# Patient Record
Sex: Female | Born: 1950 | Race: White | Hispanic: No | Smoking: Current every day smoker
Health system: Southern US, Community
[De-identification: ages and names within clinical notes are randomized; demographics above are authoritative.]

## PROBLEM LIST (undated history)

## (undated) DIAGNOSIS — R7303 Prediabetes: Secondary | ICD-10-CM

## (undated) DIAGNOSIS — R569 Unspecified convulsions: Secondary | ICD-10-CM

## (undated) DIAGNOSIS — R251 Tremor, unspecified: Secondary | ICD-10-CM

## (undated) DIAGNOSIS — Z8619 Personal history of other infectious and parasitic diseases: Secondary | ICD-10-CM

## (undated) DIAGNOSIS — I6529 Occlusion and stenosis of unspecified carotid artery: Secondary | ICD-10-CM

## (undated) DIAGNOSIS — F32A Depression, unspecified: Secondary | ICD-10-CM

## (undated) DIAGNOSIS — M5430 Sciatica, unspecified side: Secondary | ICD-10-CM

## (undated) DIAGNOSIS — R011 Cardiac murmur, unspecified: Secondary | ICD-10-CM

## (undated) DIAGNOSIS — K589 Irritable bowel syndrome without diarrhea: Secondary | ICD-10-CM

## (undated) DIAGNOSIS — M549 Dorsalgia, unspecified: Secondary | ICD-10-CM

## (undated) DIAGNOSIS — A6920 Lyme disease, unspecified: Secondary | ICD-10-CM

## (undated) DIAGNOSIS — E785 Hyperlipidemia, unspecified: Secondary | ICD-10-CM

## (undated) DIAGNOSIS — Z923 Personal history of irradiation: Secondary | ICD-10-CM

## (undated) DIAGNOSIS — K219 Gastro-esophageal reflux disease without esophagitis: Secondary | ICD-10-CM

## (undated) DIAGNOSIS — F329 Major depressive disorder, single episode, unspecified: Secondary | ICD-10-CM

## (undated) DIAGNOSIS — M1990 Unspecified osteoarthritis, unspecified site: Secondary | ICD-10-CM

## (undated) DIAGNOSIS — I2510 Atherosclerotic heart disease of native coronary artery without angina pectoris: Secondary | ICD-10-CM

## (undated) DIAGNOSIS — E559 Vitamin D deficiency, unspecified: Secondary | ICD-10-CM

## (undated) DIAGNOSIS — I10 Essential (primary) hypertension: Secondary | ICD-10-CM

## (undated) DIAGNOSIS — M25559 Pain in unspecified hip: Secondary | ICD-10-CM

## (undated) DIAGNOSIS — J45909 Unspecified asthma, uncomplicated: Secondary | ICD-10-CM

## (undated) DIAGNOSIS — J439 Emphysema, unspecified: Secondary | ICD-10-CM

## (undated) HISTORY — PX: ABDOMINAL HYSTERECTOMY: SHX81

## (undated) HISTORY — PX: CAROTID ENDARTERECTOMY: SUR193

## (undated) HISTORY — PX: CHOLECYSTECTOMY: SHX55

## (undated) HISTORY — PX: OTHER SURGICAL HISTORY: SHX169

---

## 2016-03-09 ENCOUNTER — Observation Stay
Admission: EM | Admit: 2016-03-09 | Discharge: 2016-03-10 | Disposition: A | Discharge status: Home / Self Care | Payer: Medicare Other | Attending: Internal Medicine | Admitting: Internal Medicine

## 2016-03-09 ENCOUNTER — Emergency Department: Payer: Medicare Other

## 2016-03-09 DIAGNOSIS — I739 Peripheral vascular disease, unspecified: Secondary | ICD-10-CM | POA: Insufficient documentation

## 2016-03-09 DIAGNOSIS — Z818 Family history of other mental and behavioral disorders: Secondary | ICD-10-CM | POA: Insufficient documentation

## 2016-03-09 DIAGNOSIS — F329 Major depressive disorder, single episode, unspecified: Secondary | ICD-10-CM | POA: Insufficient documentation

## 2016-03-09 DIAGNOSIS — I959 Hypotension, unspecified: Secondary | ICD-10-CM | POA: Diagnosis not present

## 2016-03-09 DIAGNOSIS — Z8249 Family history of ischemic heart disease and other diseases of the circulatory system: Secondary | ICD-10-CM | POA: Insufficient documentation

## 2016-03-09 DIAGNOSIS — Z811 Family history of alcohol abuse and dependence: Secondary | ICD-10-CM | POA: Diagnosis not present

## 2016-03-09 DIAGNOSIS — R55 Syncope and collapse: Secondary | ICD-10-CM | POA: Diagnosis not present

## 2016-03-09 DIAGNOSIS — I6529 Occlusion and stenosis of unspecified carotid artery: Secondary | ICD-10-CM | POA: Insufficient documentation

## 2016-03-09 DIAGNOSIS — J45909 Unspecified asthma, uncomplicated: Secondary | ICD-10-CM | POA: Diagnosis not present

## 2016-03-09 DIAGNOSIS — I10 Essential (primary) hypertension: Secondary | ICD-10-CM | POA: Diagnosis present

## 2016-03-09 DIAGNOSIS — Z87891 Personal history of nicotine dependence: Secondary | ICD-10-CM | POA: Insufficient documentation

## 2016-03-09 DIAGNOSIS — Z9049 Acquired absence of other specified parts of digestive tract: Secondary | ICD-10-CM | POA: Diagnosis not present

## 2016-03-09 DIAGNOSIS — R011 Cardiac murmur, unspecified: Secondary | ICD-10-CM | POA: Diagnosis not present

## 2016-03-09 DIAGNOSIS — Z880 Allergy status to penicillin: Secondary | ICD-10-CM | POA: Diagnosis not present

## 2016-03-09 DIAGNOSIS — Y92009 Unspecified place in unspecified non-institutional (private) residence as the place of occurrence of the external cause: Secondary | ICD-10-CM | POA: Diagnosis not present

## 2016-03-09 DIAGNOSIS — F17200 Nicotine dependence, unspecified, uncomplicated: Secondary | ICD-10-CM | POA: Insufficient documentation

## 2016-03-09 DIAGNOSIS — Z7982 Long term (current) use of aspirin: Secondary | ICD-10-CM | POA: Insufficient documentation

## 2016-03-09 DIAGNOSIS — Z833 Family history of diabetes mellitus: Secondary | ICD-10-CM | POA: Diagnosis not present

## 2016-03-09 DIAGNOSIS — Z79899 Other long term (current) drug therapy: Secondary | ICD-10-CM | POA: Insufficient documentation

## 2016-03-09 DIAGNOSIS — Z90710 Acquired absence of both cervix and uterus: Secondary | ICD-10-CM | POA: Diagnosis not present

## 2016-03-09 DIAGNOSIS — Z820 Family history of epilepsy and other diseases of the nervous system: Secondary | ICD-10-CM | POA: Insufficient documentation

## 2016-03-09 DIAGNOSIS — E785 Hyperlipidemia, unspecified: Secondary | ICD-10-CM | POA: Diagnosis not present

## 2016-03-09 DIAGNOSIS — K589 Irritable bowel syndrome without diarrhea: Secondary | ICD-10-CM | POA: Insufficient documentation

## 2016-03-09 DIAGNOSIS — F32A Depression, unspecified: Secondary | ICD-10-CM | POA: Diagnosis present

## 2016-03-09 HISTORY — DX: Occlusion and stenosis of unspecified carotid artery: I65.29

## 2016-03-09 HISTORY — DX: Hyperlipidemia, unspecified: E78.5

## 2016-03-09 HISTORY — DX: Cardiac murmur, unspecified: R01.1

## 2016-03-09 HISTORY — DX: Essential (primary) hypertension: I10

## 2016-03-09 HISTORY — DX: Irritable bowel syndrome, unspecified: K58.9

## 2016-03-09 HISTORY — DX: Depression, unspecified: F32.A

## 2016-03-09 HISTORY — DX: Major depressive disorder, single episode, unspecified: F32.9

## 2016-03-09 HISTORY — DX: Unspecified asthma, uncomplicated: J45.909

## 2016-03-09 LAB — BASIC METABOLIC PANEL
Anion gap: 7 (ref 5–15)
BUN: 15 mg/dL (ref 6–20)
CO2: 26 mmol/L (ref 22–32)
Calcium: 9.1 mg/dL (ref 8.9–10.3)
Chloride: 106 mmol/L (ref 101–111)
Creatinine, Ser: 0.75 mg/dL (ref 0.44–1.00)
GFR calc Af Amer: >60 mL/min (ref >60)
Glucose, Bld: 121 mg/dL — ABNORMAL HIGH (ref 65–99)
POTASSIUM: 4 mmol/L (ref 3.5–5.1)
Sodium: 139 mmol/L (ref 135–145)

## 2016-03-09 LAB — CBC WITH DIFFERENTIAL/PLATELET
BASOS ABS: 0 10*3/uL (ref 0–0.1)
Basophils Relative: 0 %
Eosinophils Absolute: 0 10*3/uL (ref 0–0.7)
Eosinophils Relative: 0 %
HCT: 41.8 % (ref 35.0–47.0)
Hemoglobin: 14.2 g/dL (ref 12.0–16.0)
LYMPHS PCT: 15 %
Lymphs Abs: 1.5 10*3/uL (ref 1.0–3.6)
MCH: 30.9 pg (ref 26.0–34.0)
MCHC: 34 g/dL (ref 32.0–36.0)
MCV: 90.9 fL (ref 80.0–100.0)
Monocytes Absolute: 0.5 10*3/uL (ref 0.2–0.9)
Monocytes Relative: 5 %
NEUTROS ABS: 8.2 10*3/uL — AB (ref 1.4–6.5)
NEUTROS PCT: 80 %
PLATELETS: 139 10*3/uL — AB (ref 150–440)
RBC: 4.6 MIL/uL (ref 3.80–5.20)
RDW: 13.1 % (ref 11.5–14.5)
WBC: 10.2 10*3/uL (ref 3.6–11.0)

## 2016-03-09 LAB — TROPONIN I: Troponin I: <0.03 ng/mL (ref <0.031)

## 2016-03-09 NOTE — ED Notes (Signed)
Called lab about blood work sent, lab reports that blood was hemolyzed and needs to be collected

## 2016-03-09 NOTE — ED Notes (Signed)
Pt from home via EMS. Reports she was cooking dinner when she felt like her energy left her and she lost consciousness. Reports son caught pt before she hit the ground. Reports back pain where she fell.

## 2016-03-09 NOTE — ED Provider Notes (Signed)
[facility] Emergency Department Provider Note    ____________________________________________  Time seen: ~2100  I have reviewed the triage vital signs and the nursing notes.   HISTORY  Chief Complaint Loss of Consciousness   History limited by: Not Limited   HPI Barbara Contreras is a 65 y.o. female who presents to the emergency department today because ofconcern for a syncopal episode. Patient states she was cooking dinner. She states all of a sudden she felt like all of her energy left her. She then passed out. She was, by her son and her way to the ground. She had some mild back pain after the fall. She denies any concurrent chest pain, palpitations or shortness breath. Denies any recent illness, nausea or vomiting.    Past Medical History  Diagnosis Date  . Hypertension     There are no active problems to display for this patient.   History reviewed. No pertinent past surgical history.  No current outpatient prescriptions on file.  Allergies Penicillins  No family history on file.  Social History Social History  Substance Use Topics  . Smoking status: Current Every Day Smoker  . Smokeless tobacco: None  . Alcohol Use: Yes    Review of Systems  Constitutional: Negative for fever. Cardiovascular: Negative for chest pain. Respiratory: Negative for shortness of breath. Gastrointestinal: Negative for abdominal pain, vomiting and diarrhea. Neurological: Negative for headaches, focal weakness or numbness.   10-point ROS otherwise negative.  ____________________________________________   PHYSICAL EXAM:  VITAL SIGNS: ED Triage Vitals  Enc Vitals Group     BP --      Pulse --      Resp --      Temp 03/09/16 1917 97.7 F (36.5 C)     Temp src --      SpO2 --      Weight 03/09/16 1917 172 lb (78.019 kg)     Height 03/09/16 1917 5\' 5"  (1.651 m)     Head Cir --      Peak Flow --      Pain Score 03/09/16 1918 3    Constitutional: Alert and oriented. Well appearing and in no distress. Eyes: Conjunctivae are normal. PERRL. Normal extraocular movements. ENT   Head: Normocephalic and atraumatic.   Nose: No congestion/rhinnorhea.   Mouth/Throat: Mucous membranes are moist.   Neck: No stridor. Hematological/Lymphatic/Immunilogical: No cervical lymphadenopathy. Cardiovascular: Normal rate, regular rhythm.  Grade II/VI systolic murmur.  Respiratory: Normal respiratory effort without tachypnea nor retractions. Breath sounds are clear and equal bilaterally. No wheezes/rales/rhonchi. Gastrointestinal: Soft and nontender. No distention Genitourinary: Deferred Musculoskeletal: Normal range of motion in all extremities. No joint effusions.  No lower extremity tenderness nor edema. Neurologic:  Normal speech and language. No gross focal neurologic deficits are appreciated.  Skin:  Skin is warm, dry and intact. No rash noted. Psychiatric: Mood and affect are normal. Speech and behavior are normal. Patient exhibits appropriate insight and judgment.  ____________________________________________    LABS (pertinent positives/negatives)  Labs Reviewed  CBC WITH DIFFERENTIAL/PLATELET - Abnormal; Notable for the following:    Platelets 139 (*)    Neutro Abs 8.2 (*)    All other components within normal limits  BASIC METABOLIC PANEL - Abnormal; Notable for the following:    Glucose, Bld 121 (*)    All other components within normal limits  TROPONIN I     ____________________________________________   EKG  I, Nance Pear, attending physician, personally viewed and interpreted this EKG  EKG  Time: 1921 Rate: 71 Rhythm: normal sinus rhythm Axis: left axis deviation Intervals: qtc 458 QRS: narrow ST changes: no st elevation Impression: abnormal ekg   ____________________________________________    RADIOLOGY  CXR IMPRESSION: Minimal linear left base scarring or  atelectasis.  ____________________________________________   PROCEDURES  Procedure(s) performed: None  Critical Care performed: No  ____________________________________________   INITIAL IMPRESSION / ASSESSMENT AND PLAN / ED COURSE  Pertinent labs & imaging results that were available during my care of the patient were reviewed by me and considered in my medical decision making (see chart for details).  Patient presented to the emergency department today after a syncopal episode. Patient did not have any chest pain during that event. There are the emergency department however patient did have multiple oxygen saturation in the mid 80s. Patient without any known history of COPD. Furthermore patient does have murmur on exam. Will plan on admission possible service for further workup.  ____________________________________________   FINAL CLINICAL IMPRESSION(S) / ED DIAGNOSES  Syncopy  Nance Pear, MD 03/09/16 2318

## 2016-03-09 NOTE — ED Notes (Signed)
Ambulated pt, Oxygen stayed in the 90s on RA, but dips into the 80s occasionally while pt is lying

## 2016-03-09 NOTE — ED Notes (Signed)
Patient transported to X-ray 

## 2016-03-09 NOTE — ED Notes (Signed)
Pt placed on 2L oxygen 

## 2016-03-10 DIAGNOSIS — R55 Syncope and collapse: Secondary | ICD-10-CM | POA: Diagnosis not present

## 2016-03-10 LAB — BASIC METABOLIC PANEL
ANION GAP: 4 — AB (ref 5–15)
BUN: 15 mg/dL (ref 6–20)
CALCIUM: 8.9 mg/dL (ref 8.9–10.3)
CHLORIDE: 107 mmol/L (ref 101–111)
CO2: 28 mmol/L (ref 22–32)
Creatinine, Ser: 0.67 mg/dL (ref 0.44–1.00)
GFR calc non Af Amer: >60 mL/min (ref >60)
Glucose, Bld: 108 mg/dL — ABNORMAL HIGH (ref 65–99)
Potassium: 3.9 mmol/L (ref 3.5–5.1)
Sodium: 139 mmol/L (ref 135–145)

## 2016-03-10 LAB — CREATININE, SERUM
CREATININE: 0.76 mg/dL (ref 0.44–1.00)
GFR calc Af Amer: >60 mL/min (ref >60)
GFR calc non Af Amer: >60 mL/min (ref >60)

## 2016-03-10 LAB — CBC
HCT: 42.1 % (ref 35.0–47.0)
HCT: 45 % (ref 35.0–47.0)
HEMOGLOBIN: 15.1 g/dL (ref 12.0–16.0)
Hemoglobin: 14.6 g/dL (ref 12.0–16.0)
MCH: 32.2 pg (ref 26.0–34.0)
MCH: 30.7 pg (ref 26.0–34.0)
MCHC: 34.7 g/dL (ref 32.0–36.0)
MCHC: 33.4 g/dL (ref 32.0–36.0)
MCV: 92.8 fL (ref 80.0–100.0)
MCV: 91.8 fL (ref 80.0–100.0)
PLATELETS: 131 10*3/uL — AB (ref 150–440)
Platelets: 131 10*3/uL — ABNORMAL LOW (ref 150–440)
RBC: 4.53 MIL/uL (ref 3.80–5.20)
RBC: 4.91 MIL/uL (ref 3.80–5.20)
RDW: 13.3 % (ref 11.5–14.5)
RDW: 13.5 % (ref 11.5–14.5)
WBC: 9.3 10*3/uL (ref 3.6–11.0)
WBC: 8.5 10*3/uL (ref 3.6–11.0)

## 2016-03-10 LAB — TROPONIN I
Troponin I: <0.03 ng/mL (ref <0.031)
Troponin I: <0.03 ng/mL (ref <0.031)

## 2016-03-10 MED ORDER — LOSARTAN POTASSIUM 50 MG PO TABS
50.0000 mg | ORAL_TABLET | Freq: Every day | ORAL | Status: DC
  Administered 2016-03-10: 50 mg via ORAL
  Filled 2016-03-10: qty 1

## 2016-03-10 MED ORDER — ACETAMINOPHEN 650 MG RE SUPP: 650.0000 mg | Freq: Four times a day (QID) | RECTAL | Status: DC | PRN

## 2016-03-10 MED ORDER — GABAPENTIN 600 MG PO TABS
600.0000 mg | ORAL_TABLET | Freq: Three times a day (TID) | ORAL | Status: DC
  Administered 2016-03-10: 600 mg via ORAL
  Filled 2016-03-10: qty 1

## 2016-03-10 MED ORDER — ASPIRIN EC 81 MG PO TBEC
81.0000 mg | DELAYED_RELEASE_TABLET | Freq: Every day | ORAL | Status: DC
  Administered 2016-03-10: 81 mg via ORAL
  Filled 2016-03-10: qty 1

## 2016-03-10 MED ORDER — ACETAMINOPHEN 325 MG PO TABS: 650.0000 mg | ORAL_TABLET | Freq: Four times a day (QID) | ORAL | Status: DC | PRN

## 2016-03-10 MED ORDER — SODIUM CHLORIDE 0.9 % IV SOLN
INTRAVENOUS | Status: AC
  Administered 2016-03-10: 02:00:00 via INTRAVENOUS

## 2016-03-10 MED ORDER — ENOXAPARIN SODIUM 40 MG/0.4ML ~~LOC~~ SOLN: 40.0000 mg | SUBCUTANEOUS | Status: DC

## 2016-03-10 MED ORDER — RISAQUAD PO CAPS
1.0000 | ORAL_CAPSULE | Freq: Every day | ORAL | Status: DC
  Administered 2016-03-10: 1 via ORAL
  Filled 2016-03-10: qty 1

## 2016-03-10 MED ORDER — VENLAFAXINE HCL ER 37.5 MG PO CP24
37.5000 mg | ORAL_CAPSULE | Freq: Every day | ORAL | Status: DC
  Administered 2016-03-10: 37.5 mg via ORAL
  Filled 2016-03-10 (×2): qty 1

## 2016-03-10 MED ORDER — ONDANSETRON HCL 4 MG PO TABS: 4.0000 mg | ORAL_TABLET | Freq: Four times a day (QID) | ORAL | Status: DC | PRN

## 2016-03-10 MED ORDER — SODIUM CHLORIDE 0.9% FLUSH
3.0000 mL | Freq: Two times a day (BID) | INTRAVENOUS | Status: DC
  Administered 2016-03-10 (×2): 3 mL via INTRAVENOUS

## 2016-03-10 MED ORDER — ATORVASTATIN CALCIUM 20 MG PO TABS
40.0000 mg | ORAL_TABLET | Freq: Every day | ORAL | Status: DC
  Administered 2016-03-10: 40 mg via ORAL
  Filled 2016-03-10: qty 2

## 2016-03-10 MED ORDER — ONDANSETRON HCL 4 MG/2ML IJ SOLN: 4.0000 mg | Freq: Four times a day (QID) | INTRAMUSCULAR | Status: DC | PRN

## 2016-03-10 NOTE — Care Management Note (Signed)
Case Management Note  Patient Details  Name: Barbara Contreras MRN: [id number] Date of Birth: 01-02-1951  Subjective/Objective:   Met with patient at bedside for assessment of discharge planning needs. Patient lives at home with her sister. Admitted with syncopal episode. Cardiology consulted. She is independent, active and drives. No DME. No O2. Denies issues obtaining medical care, copays or other financial concerns. No needs anticipated. Case closed.                  Action/Plan: No needs   Expected Discharge Date:                  Expected Discharge Plan:  Home/Self Care  In-House Referral:     Discharge planning Services     Post Acute Care Choice:    Choice offered to:     DME Arranged:    DME Agency:     HH Arranged:    HH Agency:     Status of Service:  Completed, signed off  Medicare Important Message Given:    Date Medicare IM Given:    Medicare IM give by:    Date Additional Medicare IM Given:    Additional Medicare Important Message give by:     If discussed at [place] of Stay Meetings, dates discussed:    Additional Comments:  Jolly Mango, RN 03/10/2016, 9:12 AM

## 2016-03-10 NOTE — Progress Notes (Signed)
Pt admitted to room 242. A&Ox4, VSS, no complaints at this time. Pt oriented to call bell, telephone, bed alarm, and need to call nursing before up out of bed. Skin assessed and telemetry verified with Lexi, RN. RN will continue to monitor. Rachael Fee, RN

## 2016-03-10 NOTE — Consult Note (Signed)
Reason for Consult: Syncope Referring Physician: Dr. Lance Coon hospitalist, Dr. Myna Bright primary  Barbara Contreras is an 65 y.o. female.  HPI: Patient reportedly had a syncopal episode at home witnessed by his brother-in-law while walking in the house. Patient states she has not had an episode of this before had no significant warning any sense movement of her mother-in-law tried to work up and she seemed like she went out a few more times and fluctuating level of consciousness before EMS showed up. Patient denied any chest pain no shortness of breath no incontinence denies any palpitations or tachycardia. She has a history of peripheral vascular disease including carotid endarterectomy for stenosis. Patient has no history of seizures feels fine now. May have had hypotension when evaluated by EMS. Patient is worried she may have had inner ear trouble has a known murmur had recently had noninvasive cardiac evaluation with her cardiologist Dr. Nehemiah Massed a few months ago including stress test which was unremarkable now here for follow-up.  Past Medical History  Diagnosis Date  . Hypertension   . HLD (hyperlipidemia)   . IBS (irritable bowel syndrome)   . Asthma   . Carotid stenosis   . Depression   . Heart murmur     Past Surgical History  Procedure Laterality Date  . Carotid endarterectomy    . Abdominal hysterectomy    . Cholecystectomy      Family History  Problem Relation Age of Onset  . Alcohol abuse Father   . Alzheimer's disease Father   . Anxiety disorder Father   . Depression Father   . Alcohol abuse Mother   . CAD Mother   . Diabetes Mother   . Hyperlipidemia Mother   . Depression Mother     Social History:  reports that she has been smoking.  She does not have any smokeless tobacco history on file. She reports that she drinks alcohol. She reports that she does not use illicit drugs.  Allergies:  Allergies  Allergen Reactions  . Morphine And Related Itching   . Penicillins Other (See Comments)    Reaction: unknown Patient cannot answer follow-up questions.    Medications: I have reviewed the patient's current medications.  Results for orders placed or performed during the hospital encounter of 03/09/16 (from the past 48 hour(s))  CBC with Differential     Status: Abnormal   Collection Time: 03/09/16  8:55 PM  Result Value Ref Range   WBC 10.2 3.6 - 11.0 K/uL   RBC 4.60 3.80 - 5.20 MIL/uL   Hemoglobin 14.2 12.0 - 16.0 g/dL   HCT 41.8 35.0 - 47.0 %   MCV 90.9 80.0 - 100.0 fL   MCH 30.9 26.0 - 34.0 pg   MCHC 34.0 32.0 - 36.0 g/dL   RDW 13.1 11.5 - 14.5 %   Platelets 139 (L) 150 - 440 K/uL   Neutrophils Relative % 80 %   Neutro Abs 8.2 (H) 1.4 - 6.5 K/uL   Lymphocytes Relative 15 %   Lymphs Abs 1.5 1.0 - 3.6 K/uL   Monocytes Relative 5 %   Monocytes Absolute 0.5 0.2 - 0.9 K/uL   Eosinophils Relative 0 %   Eosinophils Absolute 0.0 0 - 0.7 K/uL   Basophils Relative 0 %   Basophils Absolute 0.0 0 - 0.1 K/uL  Basic metabolic panel     Status: Abnormal   Collection Time: 03/09/16  8:55 PM  Result Value Ref Range   Sodium 139 135 - 145 mmol/L  Potassium 4.0 3.5 - 5.1 mmol/L   Chloride 106 101 - 111 mmol/L   CO2 26 22 - 32 mmol/L   Glucose, Bld 121 (H) 65 - 99 mg/dL   BUN 15 6 - 20 mg/dL   Creatinine, Ser 0.75 0.44 - 1.00 mg/dL   Calcium 9.1 8.9 - 10.3 mg/dL   GFR calc non Af Amer >60 >60 mL/min   GFR calc Af Amer >60 >60 mL/min    Comment: (NOTE) The eGFR has been calculated using the CKD EPI equation. This calculation has not been validated in all clinical situations. eGFR's persistently <60 mL/min signify possible Chronic Kidney Disease.    Anion gap 7 5 - 15  Troponin I     Status: None   Collection Time: 03/09/16  8:55 PM  Result Value Ref Range   Troponin I <0.03 <0.031 ng/mL    Comment:        NO INDICATION OF MYOCARDIAL INJURY.   CBC     Status: Abnormal   Collection Time: 03/10/16  3:17 AM  Result Value Ref  Range   WBC 9.3 3.6 - 11.0 K/uL   RBC 4.53 3.80 - 5.20 MIL/uL   Hemoglobin 14.6 12.0 - 16.0 g/dL   HCT 42.1 35.0 - 47.0 %   MCV 92.8 80.0 - 100.0 fL   MCH 32.2 26.0 - 34.0 pg   MCHC 34.7 32.0 - 36.0 g/dL   RDW 13.3 11.5 - 14.5 %   Platelets 131 (L) 150 - 440 K/uL  Creatinine, serum     Status: None   Collection Time: 03/10/16  3:17 AM  Result Value Ref Range   Creatinine, Ser 0.76 0.44 - 1.00 mg/dL   GFR calc non Af Amer >60 >60 mL/min   GFR calc Af Amer >60 >60 mL/min    Comment: (NOTE) The eGFR has been calculated using the CKD EPI equation. This calculation has not been validated in all clinical situations. eGFR's persistently <60 mL/min signify possible Chronic Kidney Disease.   Troponin I     Status: None   Collection Time: 03/10/16  3:17 AM  Result Value Ref Range   Troponin I <0.03 <0.031 ng/mL    Comment:        NO INDICATION OF MYOCARDIAL INJURY.   Troponin I     Status: None   Collection Time: 03/10/16  7:58 AM  Result Value Ref Range   Troponin I <0.03 <0.031 ng/mL    Comment:        NO INDICATION OF MYOCARDIAL INJURY.   Basic metabolic panel     Status: Abnormal   Collection Time: 03/10/16  7:58 AM  Result Value Ref Range   Sodium 139 135 - 145 mmol/L   Potassium 3.9 3.5 - 5.1 mmol/L   Chloride 107 101 - 111 mmol/L   CO2 28 22 - 32 mmol/L   Glucose, Bld 108 (H) 65 - 99 mg/dL   BUN 15 6 - 20 mg/dL   Creatinine, Ser 0.67 0.44 - 1.00 mg/dL   Calcium 8.9 8.9 - 10.3 mg/dL   GFR calc non Af Amer >60 >60 mL/min   GFR calc Af Amer >60 >60 mL/min    Comment: (NOTE) The eGFR has been calculated using the CKD EPI equation. This calculation has not been validated in all clinical situations. eGFR's persistently <60 mL/min signify possible Chronic Kidney Disease.    Anion gap 4 (L) 5 - 15  CBC     Status: Abnormal  Collection Time: 03/10/16  7:58 AM  Result Value Ref Range   WBC 8.5 3.6 - 11.0 K/uL   RBC 4.91 3.80 - 5.20 MIL/uL   Hemoglobin 15.1 12.0 -  16.0 g/dL   HCT 45.0 35.0 - 47.0 %   MCV 91.8 80.0 - 100.0 fL   MCH 30.7 26.0 - 34.0 pg   MCHC 33.4 32.0 - 36.0 g/dL   RDW 13.5 11.5 - 14.5 %   Platelets 131 (L) 150 - 440 K/uL    Dg Chest 2 View  03/09/2016  CLINICAL DATA:  Syncope.  Fell at home. EXAM: CHEST  2 VIEW COMPARISON:  None. FINDINGS: Mild linear opacities in the left base may represent scarring or minimal atelectasis. No confluent alveolar opacity. No effusions. Normal pulmonary vasculature. Normal heart size. Unremarkable hilar and mediastinal contours. IMPRESSION: Minimal linear left base scarring or atelectasis. Electronically Signed   By: Andreas Newport M.D.   On: 03/09/2016 22:06    Review of Systems  Unable to perform ROS Constitutional: Negative.   HENT: Negative.   Eyes: Negative.   Respiratory: Negative.   Gastrointestinal: Negative.   Genitourinary: Negative.   Musculoskeletal: Negative.   Skin: Negative.   Neurological: Positive for dizziness and loss of consciousness.  Endo/Heme/Allergies: Negative.   Psychiatric/Behavioral: Negative.    Blood pressure 141/69, pulse 59, temperature 98.3 F (36.8 C), temperature source Oral, resp. rate 18, height 5' 5"  (1.651 m), weight 78.019 kg (172 lb), SpO2 92 %. Physical Exam  Nursing note and vitals reviewed. Constitutional: She is oriented to person, place, and time. She appears well-developed and well-nourished.  HENT:  Head: Normocephalic and atraumatic.  Eyes: Conjunctivae and EOM are normal. Pupils are equal, round, and reactive to light.  Neck: Normal range of motion. Neck supple.  Cardiovascular: Normal rate and regular rhythm.   Murmur heard. Respiratory: Effort normal and breath sounds normal.  GI: Soft. Bowel sounds are normal.  Musculoskeletal: Normal range of motion.  Neurological: She is alert and oriented to person, place, and time. She has normal reflexes.  Skin: Skin is warm and dry.    Assessment/Plan: Syncope Murmur Peripheral vascular  disease CEA by history Hypertension Asthma Hyperlipidemia Depression Smoking Hypotension . PLAN Agree with admission to rule out myocardial infarction Agree with telemetry for 24 hours Recommend echocardiogram for further assessment for murmur Peripheral vascular disease by history consider carotid Dopplers Reduce blood pressure medications because recent hypotension Recommend inhalers for asthma symptoms Advised patient quit smoking Secondary prevention Follow-up EKGs and troponins Consider neurology evaluation Consider ENT evaluation If patient remains stable would consider cardiology workup as an outpatient Have the patient follow-up with Dr. Nehemiah Massed one to 2 weeks  CALLWOOD,DWAYNE D. 03/10/2016, 12:39 PM

## 2016-03-10 NOTE — H&P (Signed)
[place] [facility] [place] NAME: Barbara Contreras    MR#:  [id number]  DATE OF BIRTH:  1951-09-19  DATE OF ADMISSION:  03/09/2016  PRIMARY CARE PHYSICIAN: No primary care provider on file.   REQUESTING/REFERRING PHYSICIAN: Archie Balboa, MD  CHIEF COMPLAINT:   Chief Complaint  Patient presents with  . Loss of Consciousness    HISTORY OF PRESENT ILLNESS:  Barbara Contreras  is a 65 y.o. female who presents with Syncopal episode. Patient states that she was at her house tonight, and was walking in her kitchen when she had a sudden onset syncopal episode. She states that she did not have any pre-symptoms with lightheadedness, visual disturbance. She also does not note any accompanying symptoms such as chest pain or palpitations. Event was witnessed by her brother, which she states caught her. She states that her brother told her that she had to brief repeat syncopal episodes immediately if she tried to get up. Patient does not recall this. When she did come to there was no significant postictal state, with no loss of bowel or bladder continence. Patient is no prior history of seizures. She does have a history of carotid stenosis with prior endarterectomy, and a heart murmur. Hospitals were called for further evaluation of syncope.  PAST MEDICAL HISTORY:   Past Medical History  Diagnosis Date  . Hypertension   . HLD (hyperlipidemia)   . IBS (irritable bowel syndrome)   . Asthma   . Carotid stenosis   . Depression   . Heart murmur     PAST SURGICAL HISTORY:   Past Surgical History  Procedure Laterality Date  . Carotid endarterectomy    . Abdominal hysterectomy    . Cholecystectomy      SOCIAL HISTORY:   Social History  Substance Use Topics  . Smoking status: Current Every Day Smoker  . Smokeless tobacco: Not on file  . Alcohol Use: 0.0 oz/week    0 Standard drinks or equivalent per week    FAMILY HISTORY:   Family History   Problem Relation Age of Onset  . Alcohol abuse Father   . Alzheimer's disease Father   . Anxiety disorder Father   . Depression Father   . Alcohol abuse Mother   . CAD Mother   . Diabetes Mother   . Hyperlipidemia Mother   . Depression Mother     DRUG ALLERGIES:   Allergies  Allergen Reactions  . Penicillins Other (See Comments)    Reaction: unknown Patient cannot answer follow-up questions.    MEDICATIONS AT HOME:   Prior to Admission medications   Medication Sig Start Date End Date Taking? Authorizing Provider  acidophilus (RISAQUAD) CAPS capsule Take 1 capsule by mouth daily.   Yes Historical Provider, MD  aspirin EC 81 MG tablet Take 81 mg by mouth daily.   Yes Historical Provider, MD  atorvastatin (LIPITOR) 40 MG tablet Take 40 mg by mouth daily. 02/17/16  Yes Historical Provider, MD  Cholecalciferol (VITAMIN D3) 2000 units capsule Take 2,000 Units by mouth daily.   Yes Historical Provider, MD  fluticasone (FLONASE) 50 MCG/ACT nasal spray Place 2 sprays into both nostrils daily. 02/15/16  Yes Historical Provider, MD  gabapentin (NEURONTIN) 600 MG tablet Take 600 mg by mouth 3 (three) times daily. 01/24/16  Yes Historical Provider, MD  losartan (COZAAR) 50 MG tablet Take 50 mg by mouth daily. 02/11/16  Yes Historical Provider, MD  Multiple Vitamin (MULTIVITAMIN WITH MINERALS) TABS tablet Take  1 tablet by mouth daily.   Yes Historical Provider, MD  triamterene-hydrochlorothiazide (DYAZIDE) 37.5-25 MG capsule Take 1 capsule by mouth daily. 02/15/16  Yes Historical Provider, MD  venlafaxine (EFFEXOR) 37.5 MG tablet Take 37.5 mg by mouth daily. 01/23/16  Yes Historical Provider, MD    REVIEW OF SYSTEMS:  Review of Systems  Constitutional: Negative for fever, chills, weight loss and malaise/fatigue.  HENT: Negative for ear pain, hearing loss and tinnitus.   Eyes: Negative for blurred vision, double vision, pain and redness.  Respiratory: Negative for cough, hemoptysis and  shortness of breath.   Cardiovascular: Negative for chest pain, palpitations, orthopnea and leg swelling.  Gastrointestinal: Negative for nausea, vomiting, abdominal pain, diarrhea and constipation.  Genitourinary: Negative for dysuria, frequency and hematuria.  Musculoskeletal: Negative for back pain, joint pain and neck pain.  Skin:       No acne, rash, or lesions  Neurological: Positive for loss of consciousness. Negative for dizziness, tremors, focal weakness and weakness.  Endo/Heme/Allergies: Negative for polydipsia. Does not bruise/bleed easily.  Psychiatric/Behavioral: Negative for depression. The patient is not nervous/anxious and does not have insomnia.      VITAL SIGNS:   Filed Vitals:   03/09/16 2231 03/10/16 0000 03/10/16 0015 03/10/16 0030  BP:  140/68  141/54  Pulse:  64 65 66  Temp:      Resp:  15 16 13   Height:      Weight:      SpO2: 96% 97% 95% 96%   Wt Readings from Last 3 Encounters:  03/09/16 78.019 kg (172 lb)    PHYSICAL EXAMINATION:  Physical Exam  Vitals reviewed. Constitutional: She is oriented to person, place, and time. She appears well-developed and well-nourished. No distress.  HENT:  Head: Normocephalic and atraumatic.  Mouth/Throat: Oropharynx is clear and moist.  Eyes: Conjunctivae and EOM are normal. Pupils are equal, round, and reactive to light. No scleral icterus.  Neck: Normal range of motion. Neck supple. No JVD present. No thyromegaly present.  Cardiovascular: Normal rate, regular rhythm and intact distal pulses.  Exam reveals no gallop and no friction rub.   Murmur (2/6 systolic murmur) heard. Respiratory: Effort normal and breath sounds normal. No respiratory distress. She has no wheezes. She has no rales.  GI: Soft. Bowel sounds are normal. She exhibits no distension. There is no tenderness.  Musculoskeletal: Normal range of motion. She exhibits no edema.  No arthritis, no gout  Lymphadenopathy:    She has no cervical adenopathy.   Neurological: She is alert and oriented to person, place, and time. No cranial nerve deficit.  No dysarthria, no aphasia  Skin: Skin is warm and dry. No rash noted. No erythema.  Psychiatric: She has a normal mood and affect. Her behavior is normal. Judgment and thought content normal.    LABORATORY PANEL:   CBC  Recent Labs Lab 03/09/16 2055  WBC 10.2  HGB 14.2  HCT 41.8  PLT 139*   ------------------------------------------------------------------------------------------------------------------  Chemistries   Recent Labs Lab 03/09/16 2055  NA 139  K 4.0  CL 106  CO2 26  GLUCOSE 121*  BUN 15  CREATININE 0.75  CALCIUM 9.1   ------------------------------------------------------------------------------------------------------------------  Cardiac Enzymes  Recent Labs Lab 03/09/16 2055  TROPONINI <0.03   ------------------------------------------------------------------------------------------------------------------  RADIOLOGY:  Dg Chest 2 View  03/09/2016  CLINICAL DATA:  Syncope.  Fell at home. EXAM: CHEST  2 VIEW COMPARISON:  None. FINDINGS: Mild linear opacities in the left base may represent scarring or minimal atelectasis.  No confluent alveolar opacity. No effusions. Normal pulmonary vasculature. Normal heart size. Unremarkable hilar and mediastinal contours. IMPRESSION: Minimal linear left base scarring or atelectasis. Electronically Signed   By: Andreas Newport M.D.   On: 03/09/2016 22:06    EKG:   Orders placed or performed during the hospital encounter of 03/09/16  . EKG 12-Lead  . EKG 12-Lead  . EKG 12-Lead  . EKG 12-Lead    IMPRESSION AND PLAN:  Principal Problem:   Syncope and collapse - we'll admit for monitoring with telemetry, echocardiogram, trend cardiac enzymes, and cardiology consult. Active Problems:   HTN (hypertension) - continue home meds   Heart murmur - soft systolic murmur, patient states that she's had this murmur for some  time. Echocardiogram for evaluation as above.   HLD (hyperlipidemia) - continue home meds   Depression - continue home meds  All the records are reviewed and case discussed with ED provider. Management plans discussed with the patient and/or family.  DVT PROPHYLAXIS: SubQ lovenox  GI PROPHYLAXIS: None  ADMISSION STATUS: Observation  CODE STATUS: Full Code Status History    This patient does not have a recorded code status. Please follow your organizational policy for patients in this situation.      TOTAL TIME TAKING CARE OF THIS PATIENT: 40 minutes.    Israel Werts [place] 03/10/2016, 12:51 AM  [facility]  Office  [phone]  CC: Primary care physician; No primary care provider on file.

## 2016-03-10 NOTE — Care Management Obs Status (Signed)
[place] NOTIFICATION   Patient Details  Name: Barbara Contreras MRN: [id number] Date of Birth: 1951/03/13   Medicare Observation Status Notification Given:  Yes    Jolly Mango, RN 03/10/2016, 8:46 AM

## 2016-03-10 NOTE — Discharge Instructions (Signed)
Near-Syncope Near-syncope (commonly known as near fainting) is sudden weakness, dizziness, or feeling like you might pass out. This can happen when getting up or while standing for a long time. It is caused by a sudden decrease in blood flow to the brain, which can occur for various reasons. Most of the reasons are not serious.  HOME CARE Watch your condition for any changes.  Have someone stay with you until you feel stable.  If you feel like you are going to pass out:  Lie down right away.  Prop your feet up if you can.  Breathe deeply and steadily.  Move only when the feeling has gone away. Most of the time, this feeling lasts only a few minutes. You may feel tired for several hours.  Drink enough fluids to keep your pee (urine) clear or pale yellow.  If you are taking blood pressure or heart medicine, stand up slowly.  Follow up with your doctor as told. GET HELP RIGHT AWAY IF:   You have a severe headache.  You have unusual pain in the chest, belly (abdomen), or back.  You have bleeding from the mouth or butt (rectum), or you have black or tarry poop (stool).  You feel your heart beat differently than normal, or you have a very fast pulse.  You pass out, or you twitch and shake when you pass out.  You pass out when sitting or lying down.  You feel confused.  You have trouble walking.  You are weak.  You have vision problems. MAKE SURE YOU:   Understand these instructions.  Will watch your condition.  Will get help right away if you are not doing well or get worse.   This information is not intended to replace advice given to you by your health care provider. Make sure you discuss any questions you have with your health care provider.   Document Released: 05/08/2008 Document Revised: 12/11/2014 Document Reviewed: 04/25/2013 Elsevier Interactive Patient Education 2016 Elsevier Inc.  

## 2016-03-13 NOTE — Discharge Summary (Signed)
Barbara Contreras at [place] NAME: Burkleigh Hor    MR#:  [id number]  DATE OF BIRTH:  11/12/1951  DATE OF ADMISSION:  03/09/2016 ADMITTING PHYSICIAN: Lance Coon, MD  DATE OF DISCHARGE: 03/10/2016  3:22 PM  PRIMARY CARE PHYSICIAN: No primary care provider on file.    ADMISSION DIAGNOSIS:  Syncope and collapse [R55]  DISCHARGE DIAGNOSIS:  Principal Problem:   Syncope and collapse Active Problems:   HTN (hypertension)   HLD (hyperlipidemia)   Depression   Heart murmur  SECONDARY DIAGNOSIS:   Past Medical History  Diagnosis Date  . Hypertension   . HLD (hyperlipidemia)   . IBS (irritable bowel syndrome)   . Asthma   . Carotid stenosis   . Depression   . Heart murmur     HOSPITAL COURSE:  65 y.o. female who was admitted for Syncopal episode. Patient states that she was at her house tonight, and was walking in her kitchen when she had a sudden onset syncopal episode.  Syncope and collapse - thought to be vasovagal in nature due to new BP meds Dyazide which likely dropped her BP significantly.  She was feeling much better off that BP meds and no further symptoms, walked around hallways without any difficulty or new symptoms and was D/C home in stable condition.  DISCHARGE CONDITIONS:   stable  CONSULTS OBTAINED:  Treatment Team:  Yolonda Kida, MD  DRUG ALLERGIES:   Allergies  Allergen Reactions  . Morphine And Related Itching  . Penicillins Other (See Comments)    Reaction: unknown Patient cannot answer follow-up questions.    DISCHARGE MEDICATIONS:   Discharge Medication List as of 03/10/2016  2:41 PM    CONTINUE these medications which have NOT CHANGED   Details  acidophilus (RISAQUAD) CAPS capsule Take 1 capsule by mouth daily., Until Discontinued, Historical Med    aspirin EC 81 MG tablet Take 81 mg by mouth daily., Until Discontinued, Historical Med    atorvastatin (LIPITOR) 40 MG tablet Take 40 mg  by mouth daily., Starting 02/17/2016, Until Discontinued, Historical Med    Cholecalciferol (VITAMIN D3) 2000 units capsule Take 2,000 Units by mouth daily., Until Discontinued, Historical Med    fluticasone (FLONASE) 50 MCG/ACT nasal spray Place 2 sprays into both nostrils daily., Starting 02/15/2016, Until Discontinued, Historical Med    gabapentin (NEURONTIN) 600 MG tablet Take 600 mg by mouth 3 (three) times daily., Starting 01/24/2016, Until Discontinued, Historical Med    losartan (COZAAR) 50 MG tablet Take 50 mg by mouth daily., Starting 02/11/2016, Until Discontinued, Historical Med    Multiple Vitamin (MULTIVITAMIN WITH MINERALS) TABS tablet Take 1 tablet by mouth daily., Until Discontinued, Historical Med    venlafaxine (EFFEXOR) 37.5 MG tablet Take 37.5 mg by mouth daily., Starting 01/23/2016, Until Discontinued, Historical Med      STOP taking these medications     triamterene-hydrochlorothiazide (DYAZIDE) 37.5-25 MG capsule          DISCHARGE INSTRUCTIONS:    DIET:  Cardiac diet  DISCHARGE CONDITION:  Good  ACTIVITY:  Activity as tolerated  OXYGEN:  Home Oxygen: No.   Oxygen Delivery: room air  DISCHARGE LOCATION:  home   If you experience worsening of your admission symptoms, develop shortness of breath, life threatening emergency, suicidal or homicidal thoughts you must seek medical attention immediately by calling 911 or calling your MD immediately  if symptoms less severe.  You Must read complete instructions/literature along with all the possible  adverse reactions/side effects for all the Medicines you take and that have been prescribed to you. Take any new Medicines after you have completely understood and accpet all the possible adverse reactions/side effects.   Please note  You were cared for by a hospitalist during your hospital stay. If you have any questions about your discharge medications or the care you received while you were in the hospital  after you are discharged, you can call the unit and asked to speak with the hospitalist on call if the hospitalist that took care of you is not available. Once you are discharged, your primary care physician will handle any further medical issues. Please note that NO REFILLS for any discharge medications will be authorized once you are discharged, as it is imperative that you return to your primary care physician (or establish a relationship with a primary care physician if you do not have one) for your aftercare needs so that they can reassess your need for medications and monitor your lab values.    On the day of Discharge:  VITAL SIGNS:  Blood pressure 141/69, pulse 59, temperature 98.3 F (36.8 C), temperature source Oral, resp. rate 18, height 5\' 5"  (1.651 m), weight 78.019 kg (172 lb), SpO2 92 %.  PHYSICAL EXAMINATION:  GENERAL:  65 y.o.-year-old patient lying in the bed with no acute distress.  EYES: Pupils equal, round, reactive to light and accommodation. No scleral icterus. Extraocular muscles intact.  HEENT: Head atraumatic, normocephalic. Oropharynx and nasopharynx clear.  NECK:  Supple, no jugular venous distention. No thyroid enlargement, no tenderness.  LUNGS: Normal breath sounds bilaterally, no wheezing, rales,rhonchi or crepitation. No use of accessory muscles of respiration.  CARDIOVASCULAR: S1, S2 normal. No murmurs, rubs, or gallops.  ABDOMEN: Soft, non-tender, non-distended. Bowel sounds present. No organomegaly or mass.  EXTREMITIES: No pedal edema, cyanosis, or clubbing.  NEUROLOGIC: Cranial nerves II through XII are intact. Muscle strength 5/5 in all extremities. Sensation intact. Gait not checked.  PSYCHIATRIC: The patient is alert and oriented x 3.  SKIN: No obvious rash, lesion, or ulcer.  DATA REVIEW:   CBC  Recent Labs Lab 03/10/16 0758  WBC 8.5  HGB 15.1  HCT 45.0  PLT 131*    Chemistries   Recent Labs Lab 03/10/16 0758  NA 139  K 3.9  CL 107   CO2 28  GLUCOSE 108*  BUN 15  CREATININE 0.67  CALCIUM 8.9    Follow-up Information    Follow up with SANTAYANA, [place], DO. Go on 03/17/2016.   Specialty:  Family Medicine   [facility]   Contact information:   [place] [address] [phone]       Follow up with Isaias Cowman, MD. Go on 03/27/2016.   Specialty:  Cardiology   [facility]   Contact information:   [facility] [place] [address] [phone]       Management plans discussed with the patient, family and they are in agreement.  CODE STATUS:  Code Status History    Date Active Date Inactive Code Status Order ID Comments User Context   03/10/2016  1:46 AM 03/10/2016  6:23 PM Full Code [id number]  Lance Coon, MD Inpatient    Advance Directive Documentation        Most Recent Value   Type of Advance Directive  Living will   Pre-existing out of facility DNR order (  yellow form or pink MOST form)     "MOST" Form in Place?        TOTAL TIME TAKING CARE OF THIS PATIENT: 45 minutes.    [facility], Kelcey Wickstrom M.D on 03/13/2016 at 11:17 AM  Between 7am to 6pm - Pager - [phone]  After 6pm go to [email or web address] - password [email or web address] [facility]  [place] [facility]  Office  [phone]  CC: Primary care physician; No primary care provider on file.   Note: This dictation was prepared with Dragon dictation along with smaller phrase technology. Any transcriptional errors that result from this process are unintentional.

## 2016-09-29 ENCOUNTER — Other Ambulatory Visit: Payer: Self-pay | Admitting: Family Medicine

## 2016-09-29 DIAGNOSIS — Z1231 Encounter for screening mammogram for malignant neoplasm of breast: Secondary | ICD-10-CM

## 2016-10-08 ENCOUNTER — Encounter: Payer: Self-pay | Admitting: Emergency Medicine

## 2016-10-08 ENCOUNTER — Emergency Department: Payer: Medicare Other

## 2016-10-08 ENCOUNTER — Emergency Department
Admission: EM | Admit: 2016-10-08 | Discharge: 2016-10-08 | Disposition: A | Discharge status: Home / Self Care | Payer: Medicare Other | Attending: Emergency Medicine | Admitting: Emergency Medicine

## 2016-10-08 DIAGNOSIS — I10 Essential (primary) hypertension: Secondary | ICD-10-CM | POA: Diagnosis not present

## 2016-10-08 DIAGNOSIS — Y92009 Unspecified place in unspecified non-institutional (private) residence as the place of occurrence of the external cause: Secondary | ICD-10-CM | POA: Insufficient documentation

## 2016-10-08 DIAGNOSIS — S0101XA Laceration without foreign body of scalp, initial encounter: Secondary | ICD-10-CM | POA: Insufficient documentation

## 2016-10-08 DIAGNOSIS — S0990XA Unspecified injury of head, initial encounter: Secondary | ICD-10-CM

## 2016-10-08 DIAGNOSIS — F10920 Alcohol use, unspecified with intoxication, uncomplicated: Secondary | ICD-10-CM

## 2016-10-08 DIAGNOSIS — W19XXXA Unspecified fall, initial encounter: Secondary | ICD-10-CM

## 2016-10-08 DIAGNOSIS — F17210 Nicotine dependence, cigarettes, uncomplicated: Secondary | ICD-10-CM | POA: Diagnosis not present

## 2016-10-08 DIAGNOSIS — J45909 Unspecified asthma, uncomplicated: Secondary | ICD-10-CM | POA: Insufficient documentation

## 2016-10-08 DIAGNOSIS — Y999 Unspecified external cause status: Secondary | ICD-10-CM | POA: Insufficient documentation

## 2016-10-08 DIAGNOSIS — F10120 Alcohol abuse with intoxication, uncomplicated: Secondary | ICD-10-CM | POA: Insufficient documentation

## 2016-10-08 DIAGNOSIS — Y9389 Activity, other specified: Secondary | ICD-10-CM | POA: Diagnosis not present

## 2016-10-08 DIAGNOSIS — S50811A Abrasion of right forearm, initial encounter: Secondary | ICD-10-CM | POA: Insufficient documentation

## 2016-10-08 DIAGNOSIS — Z7982 Long term (current) use of aspirin: Secondary | ICD-10-CM | POA: Insufficient documentation

## 2016-10-08 DIAGNOSIS — W1839XA Other fall on same level, initial encounter: Secondary | ICD-10-CM | POA: Insufficient documentation

## 2016-10-08 DIAGNOSIS — Z79899 Other long term (current) drug therapy: Secondary | ICD-10-CM | POA: Insufficient documentation

## 2016-10-08 LAB — COMPREHENSIVE METABOLIC PANEL
ALK PHOS: 70 U/L (ref 38–126)
ALT: 41 U/L (ref 14–54)
ANION GAP: 11 (ref 5–15)
AST: 35 U/L (ref 15–41)
Albumin: 4.3 g/dL (ref 3.5–5.0)
BUN: 16 mg/dL (ref 6–20)
CALCIUM: 8.7 mg/dL — AB (ref 8.9–10.3)
CO2: 26 mmol/L (ref 22–32)
Chloride: 94 mmol/L — ABNORMAL LOW (ref 101–111)
Creatinine, Ser: 0.61 mg/dL (ref 0.44–1.00)
GFR calc non Af Amer: >60 mL/min (ref >60)
Glucose, Bld: 122 mg/dL — ABNORMAL HIGH (ref 65–99)
POTASSIUM: 3.3 mmol/L — AB (ref 3.5–5.1)
SODIUM: 131 mmol/L — AB (ref 135–145)
Total Bilirubin: 0.6 mg/dL (ref 0.3–1.2)
Total Protein: 7.6 g/dL (ref 6.5–8.1)

## 2016-10-08 LAB — CBC WITH DIFFERENTIAL/PLATELET
Basophils Absolute: 0.1 10*3/uL (ref 0–0.1)
Basophils Relative: 1 %
EOS ABS: 0.1 10*3/uL (ref 0–0.7)
EOS PCT: 1 %
HCT: 47.1 % — ABNORMAL HIGH (ref 35.0–47.0)
HEMOGLOBIN: 16.4 g/dL — AB (ref 12.0–16.0)
LYMPHS ABS: 2.1 10*3/uL (ref 1.0–3.6)
Lymphocytes Relative: 28 %
MCH: 32 pg (ref 26.0–34.0)
MCHC: 34.9 g/dL (ref 32.0–36.0)
MCV: 91.8 fL (ref 80.0–100.0)
MONO ABS: 0.4 10*3/uL (ref 0.2–0.9)
MONOS PCT: 5 %
NEUTROS PCT: 65 %
Neutro Abs: 4.7 10*3/uL (ref 1.4–6.5)
Platelets: 141 10*3/uL — ABNORMAL LOW (ref 150–440)
RBC: 5.13 MIL/uL (ref 3.80–5.20)
RDW: 13.2 % (ref 11.5–14.5)
WBC: 7.4 10*3/uL (ref 3.6–11.0)

## 2016-10-08 LAB — ETHANOL: Alcohol, Ethyl (B): 250 mg/dL — ABNORMAL HIGH (ref <5)

## 2016-10-08 MED ORDER — SODIUM CHLORIDE 0.9 % IV BOLUS (SEPSIS)
1000.0000 mL | Freq: Once | INTRAVENOUS | Status: AC
  Administered 2016-10-08: 1000 mL via INTRAVENOUS

## 2016-10-08 MED ORDER — SODIUM CHLORIDE 0.9 % IV BOLUS (SEPSIS)
1000.0000 mL | Freq: Once | INTRAVENOUS | Status: AC
Start: 2016-10-08 — End: 2016-10-08
  Administered 2016-10-08: 1000 mL via INTRAVENOUS

## 2016-10-08 NOTE — Discharge Instructions (Signed)
1. Suture and staple removal in 7-10 days. 2. You may take Tylenol and/or ibuprofen as needed for pain. 3. Return to the ER for worsening symptoms, persistent vomiting, lethargy or other concerns.

## 2016-10-08 NOTE — ED Notes (Signed)
Helped pt. To bathroom, pt. Started bleeding from head wound.  Pt. Helped back to bed, dr. Ree Kida.  Dr. Beather Arbour at bedside.

## 2016-10-08 NOTE — ED Notes (Signed)
Pt informed that x ray was negative pt given phone to call niece for ride home.

## 2016-10-08 NOTE — ED Notes (Addendum)
Pt ambulatory in room with assistance of nurse. Pt states that her right shoulder is really hurting her. Active ROM but pain with movement. Pt informed that we will order an xray before discharge. Pt agreeable.

## 2016-10-08 NOTE — ED Notes (Signed)
Pt. Family member can pick up when ready to discharge. [phone] Erline Hau.

## 2016-10-08 NOTE — ED Triage Notes (Signed)
Pt. States drinking wine tonight.  Pt. Fell from standing.  Pt. Has 3-5 laceration to rt. Side of head.  Pt. Also has hematoma and abrasion to rt. Forearm.  Pt. Unsure how she fell.  Pt. Has C-collar on at this time.

## 2016-10-08 NOTE — ED Provider Notes (Signed)
[facility] Emergency Department Provider Note   ____________________________________________   First MD Initiated Contact with Patient 10/08/16 [phone]     (approximate)  I have reviewed the triage vital signs and the nursing notes.   HISTORY  Chief Complaint Fall   HPI Barbara Contreras is a 65 y.o. female brought to the ED from home with a chief complaint of fall. Patient admits to drinking 1 bottle of wine, stumbled and fell off her low-lying porch, striking her head. Possible LOC. Does not complain of pain. Denies neck pain, vision changes, chest pain, shortness of breath, abdominal pain, nausea, vomiting, diarrhea. Reports tetanus is up-to-date.   Past Medical History:  Diagnosis Date  . Asthma   . Carotid stenosis   . Depression   . Heart murmur   . HLD (hyperlipidemia)   . Hypertension   . IBS (irritable bowel syndrome)     Patient Active Problem List   Diagnosis Date Noted  . Syncope and collapse 03/09/2016  . HTN (hypertension) 03/09/2016  . HLD (hyperlipidemia) 03/09/2016  . Depression 03/09/2016  . Heart murmur 03/09/2016    Past Surgical History:  Procedure Laterality Date  . ABDOMINAL HYSTERECTOMY    . CAROTID ENDARTERECTOMY    . CHOLECYSTECTOMY      Prior to Admission medications   Medication Sig Start Date End Date Taking? Authorizing Provider  acidophilus (RISAQUAD) CAPS capsule Take 1 capsule by mouth daily.    Historical Provider, MD  aspirin EC 81 MG tablet Take 81 mg by mouth daily.    Historical Provider, MD  atorvastatin (LIPITOR) 40 MG tablet Take 40 mg by mouth daily. 02/17/16   Historical Provider, MD  Cholecalciferol (VITAMIN D3) 2000 units capsule Take 2,000 Units by mouth daily.    Historical Provider, MD  fluticasone (FLONASE) 50 MCG/ACT nasal spray Place 2 sprays into both nostrils daily. 02/15/16   Historical Provider, MD  gabapentin (NEURONTIN) 600 MG tablet Take 600 mg by mouth 3 (three) times daily.  01/24/16   Historical Provider, MD  losartan (COZAAR) 50 MG tablet Take 50 mg by mouth daily. 02/11/16   Historical Provider, MD  Multiple Vitamin (MULTIVITAMIN WITH MINERALS) TABS tablet Take 1 tablet by mouth daily.    Historical Provider, MD  venlafaxine (EFFEXOR) 37.5 MG tablet Take 37.5 mg by mouth daily. 01/23/16   Historical Provider, MD    Allergies Morphine and related and Penicillins  Family History  Problem Relation Age of Onset  . Alcohol abuse Father   . Alzheimer's disease Father   . Anxiety disorder Father   . Depression Father   . Alcohol abuse Mother   . CAD Mother   . Diabetes Mother   . Hyperlipidemia Mother   . Depression Mother     Social History Social History  Substance Use Topics  . Smoking status: Current Every Day Smoker    Packs/day: 0.50    Types: Cigarettes  . Smokeless tobacco: Not on file  . Alcohol use 0.0 oz/week    Review of Systems  Constitutional: No fever/chills. Eyes: No visual changes. ENT: No sore throat. Cardiovascular: Denies chest pain. Respiratory: Denies shortness of breath. Gastrointestinal: No abdominal pain.  No nausea, no vomiting.  No diarrhea.  No constipation. Genitourinary: Negative for dysuria. Musculoskeletal: Negative for back pain. Skin: Negative for rash. Neurological: Positive for head injury and scalp laceration. Negative for headaches, focal weakness or numbness.  10-point ROS otherwise negative.  ____________________________________________   PHYSICAL EXAM:  VITAL SIGNS: ED  Triage Vitals  Enc Vitals Group     BP      Pulse      Resp      Temp      Temp src      SpO2      Weight      Height      Head Circumference      Peak Flow      Pain Score      Pain Loc      Pain Edu?      Excl. in [place]?     Constitutional: Alert and oriented. Well appearing and in no acute distress. Eyes: Conjunctivae are normal. PERRL. EOMI. Head: Approximately 8 cm laceration to right parietal scalp which is  nonbleeding. Nose: No congestion/rhinnorhea. Mouth/Throat: No dental malocclusion. Mucous membranes are moist.  Oropharynx non-erythematous. Neck: No stridor.  No cervical spine tenderness to palpation. Cardiovascular: Normal rate, regular rhythm. Grossly normal heart sounds.  Good peripheral circulation. Respiratory: Normal respiratory effort.  No retractions. Lungs CTAB. Gastrointestinal: Soft and nontender. No distention. No abdominal bruits. No CVA tenderness. Musculoskeletal: Right forearm abrasions. Full range of motion without pain. No lower extremity tenderness nor edema.  No joint effusions. Neurologic:  Normal speech and language. No gross focal neurologic deficits are appreciated.  Skin:  Skin is warm, dry and intact. No rash noted. Psychiatric: Mood and affect are normal. Speech and behavior are normal.  ____________________________________________   LABS (all labs ordered are listed, but only abnormal results are displayed)  Labs Reviewed  CBC WITH DIFFERENTIAL/PLATELET - Abnormal; Notable for the following:       Result Value   Hemoglobin 16.4 (*)    HCT 47.1 (*)    Platelets 141 (*)    All other components within normal limits  COMPREHENSIVE METABOLIC PANEL - Abnormal; Notable for the following:    Sodium 131 (*)    Potassium 3.3 (*)    Chloride 94 (*)    Glucose, Bld 122 (*)    Calcium 8.7 (*)    All other components within normal limits  ETHANOL - Abnormal; Notable for the following:    Alcohol, Ethyl (B) 250 (*)    All other components within normal limits   ____________________________________________  EKG  ED ECG REPORT I, SUNG,JADE J, the attending physician, personally viewed and interpreted this ECG.   Date: 10/08/2016  EKG Time: 0240  Rate: 68  Rhythm: normal EKG, normal sinus rhythm  Axis: Normal  Intervals:none  ST&T Change: Nonspecific QTc 501 ____________________________________________  RADIOLOGY  CT head and cervical spine without  contrast interpreted per Dr. Alroy Dust: 1. Normal brain. Right parietal scalp hematoma.  2. Negative for acute cervical spine fracture.   ____________________________________________   PROCEDURES  Procedure(s) performed:   LACERATION REPAIR Performed by: Paulette Blanch Authorized by: Paulette Blanch Consent: Verbal consent obtained. Risks and benefits: risks, benefits and alternatives were discussed Consent given by: patient Patient identity confirmed: provided demographic data Prepped and Draped in normal sterile fashion Wound explored  Laceration Location: right parietal scalp  Laceration Length: 8cm  No Foreign Bodies seen or palpated  Anesthesia: local infiltration  Local anesthetic: lidocaine 1% w/ epinephrine  Anesthetic total: 8 ml  Irrigation method: syringe Amount of cleaning: standard  Skin closure: 4-0 nylon + staples  Number of sutures: 3 staples; 6 interrupted sutures  Technique: standard  Patient tolerance: Patient tolerated the procedure well with no immediate complications.  Procedures  Critical Care performed: No  ____________________________________________  INITIAL IMPRESSION / ASSESSMENT AND PLAN / ED COURSE  Pertinent labs & imaging results that were available during my care of the patient were reviewed by me and considered in my medical decision making (see chart for details).  65 year old female that is post-fall secondary to alcohol intoxication. Will image CT head and cervical spine; maintain c-collar in place per EMS. Will check basic lab work and initiate IV fluid resuscitation.  Clinical Course as of Oct 08 805  Nancy Fetter Oct 08, 2016  [id number] Updated patient of negative CT results. Nursing will irrigate scalp lacerations.  [JS]  [id number] Patient sleeping heavily. Will administer second liter IV fluids. Anticipate discharge once patient is ambulatory with steady gait.  [JS]  [id number] Second liter IV fluids infusing. Patient sleeping in no acute  distress. Friend will pick her up when she is ready for discharge. Nursing to ambulate patient once IV fluids are completed. Anticipate discharge home once patient is ambulatory with steady gait.  [JS]    Clinical Course User Index [JS] Paulette Blanch, MD     ____________________________________________   FINAL CLINICAL IMPRESSION(S) / ED DIAGNOSES  Final diagnoses:  Laceration of scalp, initial encounter  Injury of head, initial encounter  Fall in home, initial encounter  Alcoholic intoxication without complication ([place])      NEW MEDICATIONS STARTED DURING THIS VISIT:  New Prescriptions   No medications on file     Note:  This document was prepared using Dragon voice recognition software and may include unintentional dictation errors.    Paulette Blanch, MD 10/08/16 [id number]

## 2016-10-10 ENCOUNTER — Ambulatory Visit: Admission: RE | Admit: 2016-10-10 | Payer: Medicare Other | Source: Ambulatory Visit

## 2016-10-31 ENCOUNTER — Ambulatory Visit: Admission: RE | Admit: 2016-10-31 | Payer: Medicare Other | Source: Ambulatory Visit

## 2017-06-16 ENCOUNTER — Emergency Department: Payer: Medicare Other

## 2017-06-16 ENCOUNTER — Encounter: Payer: Self-pay | Admitting: Emergency Medicine

## 2017-06-16 ENCOUNTER — Observation Stay
Admission: EM | Admit: 2017-06-16 | Discharge: 2017-06-17 | Disposition: A | Discharge status: Home / Self Care | Payer: Medicare Other | Attending: Internal Medicine | Admitting: Internal Medicine

## 2017-06-16 DIAGNOSIS — Z7951 Long term (current) use of inhaled steroids: Secondary | ICD-10-CM | POA: Diagnosis not present

## 2017-06-16 DIAGNOSIS — R748 Abnormal levels of other serum enzymes: Secondary | ICD-10-CM | POA: Diagnosis not present

## 2017-06-16 DIAGNOSIS — E785 Hyperlipidemia, unspecified: Secondary | ICD-10-CM | POA: Diagnosis present

## 2017-06-16 DIAGNOSIS — I739 Peripheral vascular disease, unspecified: Secondary | ICD-10-CM | POA: Diagnosis not present

## 2017-06-16 DIAGNOSIS — R55 Syncope and collapse: Principal | ICD-10-CM | POA: Diagnosis present

## 2017-06-16 DIAGNOSIS — I959 Hypotension, unspecified: Secondary | ICD-10-CM | POA: Diagnosis not present

## 2017-06-16 DIAGNOSIS — Z79899 Other long term (current) drug therapy: Secondary | ICD-10-CM | POA: Diagnosis not present

## 2017-06-16 DIAGNOSIS — R7989 Other specified abnormal findings of blood chemistry: Secondary | ICD-10-CM | POA: Diagnosis present

## 2017-06-16 DIAGNOSIS — I10 Essential (primary) hypertension: Secondary | ICD-10-CM | POA: Diagnosis present

## 2017-06-16 DIAGNOSIS — F17210 Nicotine dependence, cigarettes, uncomplicated: Secondary | ICD-10-CM | POA: Insufficient documentation

## 2017-06-16 DIAGNOSIS — Z791 Long term (current) use of non-steroidal anti-inflammatories (NSAID): Secondary | ICD-10-CM | POA: Diagnosis not present

## 2017-06-16 DIAGNOSIS — M1990 Unspecified osteoarthritis, unspecified site: Secondary | ICD-10-CM | POA: Diagnosis not present

## 2017-06-16 DIAGNOSIS — W19XXXA Unspecified fall, initial encounter: Secondary | ICD-10-CM | POA: Diagnosis not present

## 2017-06-16 DIAGNOSIS — E860 Dehydration: Secondary | ICD-10-CM | POA: Insufficient documentation

## 2017-06-16 DIAGNOSIS — F329 Major depressive disorder, single episode, unspecified: Secondary | ICD-10-CM | POA: Insufficient documentation

## 2017-06-16 DIAGNOSIS — J439 Emphysema, unspecified: Secondary | ICD-10-CM | POA: Diagnosis not present

## 2017-06-16 DIAGNOSIS — F32A Depression, unspecified: Secondary | ICD-10-CM | POA: Diagnosis present

## 2017-06-16 DIAGNOSIS — R778 Other specified abnormalities of plasma proteins: Secondary | ICD-10-CM

## 2017-06-16 HISTORY — DX: Unspecified osteoarthritis, unspecified site: M19.90

## 2017-06-16 HISTORY — DX: Emphysema, unspecified: J43.9

## 2017-06-16 LAB — URINALYSIS, COMPLETE (UACMP) WITH MICROSCOPIC
Bacteria, UA: NONE SEEN
Bilirubin Urine: NEGATIVE
Glucose, UA: NEGATIVE mg/dL
Hgb urine dipstick: NEGATIVE
Ketones, ur: NEGATIVE mg/dL
Leukocytes, UA: NEGATIVE
NITRITE: NEGATIVE
Protein, ur: NEGATIVE mg/dL
SPECIFIC GRAVITY, URINE: 1.016 (ref 1.005–1.030)
pH: 6 (ref 5.0–8.0)

## 2017-06-16 LAB — BASIC METABOLIC PANEL
ANION GAP: 7 (ref 5–15)
BUN: 11 mg/dL (ref 6–20)
CO2: 27 mmol/L (ref 22–32)
Calcium: 8.5 mg/dL — ABNORMAL LOW (ref 8.9–10.3)
Chloride: 105 mmol/L (ref 101–111)
Creatinine, Ser: 0.93 mg/dL (ref 0.44–1.00)
GFR calc Af Amer: >60 mL/min (ref >60)
GFR calc non Af Amer: >60 mL/min (ref >60)
Glucose, Bld: 141 mg/dL — ABNORMAL HIGH (ref 65–99)
Potassium: 3.7 mmol/L (ref 3.5–5.1)
Sodium: 139 mmol/L (ref 135–145)

## 2017-06-16 LAB — URINE DRUG SCREEN, QUALITATIVE (ARMC ONLY)
AMPHETAMINES, UR SCREEN: NOT DETECTED
BARBITURATES, UR SCREEN: NOT DETECTED
BENZODIAZEPINE, UR SCRN: NOT DETECTED
Cannabinoid 50 Ng, Ur ~~LOC~~: POSITIVE — AB
Cocaine Metabolite,Ur ~~LOC~~: NOT DETECTED
MDMA (Ecstasy)Ur Screen: NOT DETECTED
METHADONE SCREEN, URINE: NOT DETECTED
OPIATE, UR SCREEN: NOT DETECTED
Phencyclidine (PCP) Ur S: NOT DETECTED
Tricyclic, Ur Screen: NOT DETECTED

## 2017-06-16 LAB — CBC
HCT: 39.9 % (ref 35.0–47.0)
HEMOGLOBIN: 13.5 g/dL (ref 12.0–16.0)
MCH: 32 pg (ref 26.0–34.0)
MCHC: 34 g/dL (ref 32.0–36.0)
MCV: 94.3 fL (ref 80.0–100.0)
Platelets: 149 10*3/uL — ABNORMAL LOW (ref 150–440)
RBC: 4.23 MIL/uL (ref 3.80–5.20)
RDW: 13.4 % (ref 11.5–14.5)
WBC: 6.3 10*3/uL (ref 3.6–11.0)

## 2017-06-16 LAB — GLUCOSE, CAPILLARY: GLUCOSE-CAPILLARY: 142 mg/dL — AB (ref 65–99)

## 2017-06-16 LAB — TROPONIN I
TROPONIN I: 0.03 ng/mL — AB (ref <0.03)
TROPONIN I: 0.51 ng/mL — AB (ref <0.03)

## 2017-06-16 LAB — ETHANOL: ALCOHOL ETHYL (B): 32 mg/dL — AB (ref <5)

## 2017-06-16 MED ORDER — SODIUM CHLORIDE 0.9 % IV BOLUS (SEPSIS)
1000.0000 mL | Freq: Once | INTRAVENOUS | Status: AC
  Administered 2017-06-16: 1000 mL via INTRAVENOUS

## 2017-06-16 NOTE — H&P (Signed)
[name]    MR#:  [id number]  DATE OF BIRTH:  Mar 11, 1951  DATE OF ADMISSION:  06/16/2017  PRIMARY CARE PHYSICIAN: [facility]   REQUESTING/REFERRING PHYSICIAN: Jimmye Norman, MD  CHIEF COMPLAINT:   Chief Complaint  Patient presents with  . Loss of Consciousness    HISTORY OF PRESENT ILLNESS:  Barbara Contreras  is a 66 y.o. female who presents with Syncopal episode. Patient was drinking some wine beside a pool when she had a syncopal episode. She states that this is happened to her before, and that she had a cardiac workup at that time which was all within normal limits. Today her initial workup in the ED was within normal limits except for borderline troponin. Upon recheck it had elevated to 0.5. Hospitals were called for admission and further evaluation.  PAST MEDICAL HISTORY:   Past Medical History:  Diagnosis Date  . Arthritis   . Asthma   . Carotid stenosis   . Depression   . Emphysema of lung ([place])   . Heart murmur   . HLD (hyperlipidemia)   . Hypertension   . IBS (irritable bowel syndrome)     PAST SURGICAL HISTORY:   Past Surgical History:  Procedure Laterality Date  . ABDOMINAL HYSTERECTOMY    . CAROTID ENDARTERECTOMY    . CHOLECYSTECTOMY      SOCIAL HISTORY:   Social History  Substance Use Topics  . Smoking status: Current Every Day Smoker    Packs/day: 0.50    Types: Cigarettes  . Smokeless tobacco: Never Used  . Alcohol use 0.0 oz/week    FAMILY HISTORY:   Family History  Problem Relation Age of Onset  . Alcohol abuse Father   . Alzheimer's disease Father   . Anxiety disorder Father   . Depression Father   . Alcohol abuse Mother   . CAD Mother   . Diabetes Mother   . Hyperlipidemia Mother   . Depression Mother     DRUG ALLERGIES:   Allergies  Allergen Reactions  . Morphine And Related Itching  . Penicillins Other (See Comments)     Reaction: unknown Has patient had a PCN reaction causing immediate rash, facial/tongue/throat swelling, SOB or lightheadedness with hypotension: Unknown Has patient had a PCN reaction causing severe rash involving mucus membranes or skin necrosis: Unknown Has patient had a PCN reaction that required hospitalization: Unknown Has patient had a PCN reaction occurring within the last 10 years: no If all of the above answers are "NO", then may proceed with Cephalosporin use. Marland Kitchen    MEDICATIONS AT HOME:   Prior to Admission medications   Medication Sig Start Date End Date Taking? Authorizing Provider  atorvastatin (LIPITOR) 40 MG tablet Take 40 mg by mouth daily. 02/17/16  Yes [provider]  fluticasone (FLONASE) 50 MCG/ACT nasal spray Place 2 sprays into both nostrils daily. 02/15/16  Yes [provider]  gabapentin (NEURONTIN) 600 MG tablet Take 600 mg by mouth 3 (three) times daily. 01/24/16  Yes [provider]  Multiple Vitamin (MULTIVITAMIN WITH MINERALS) TABS tablet Take 1 tablet by mouth daily.   Yes [provider]  naproxen (NAPROSYN) 500 MG tablet Take 500 mg by mouth 2 (two) times daily. 05/22/17  Yes [provider]  triamterene-hydrochlorothiazide (DYAZIDE) 37.5-25 MG capsule Take 1 capsule by mouth every morning. 06/03/17  Yes [provider]  venlafaxine XR (EFFEXOR-XR) 150 MG 24 hr capsule  Take 150 mg by mouth daily with breakfast.   Yes [provider]    REVIEW OF SYSTEMS:  Review of Systems  Constitutional: Negative for chills, fever, malaise/fatigue and weight loss.  HENT: Negative for ear pain, hearing loss and tinnitus.   Eyes: Negative for blurred vision, double vision, pain and redness.  Respiratory: Negative for cough, hemoptysis and shortness of breath.   Cardiovascular: Negative for chest pain, palpitations, orthopnea and leg swelling.  Gastrointestinal: Negative for abdominal pain, constipation, diarrhea,  nausea and vomiting.  Genitourinary: Negative for dysuria, frequency and hematuria.  Musculoskeletal: Negative for back pain, joint pain and neck pain.  Skin:       No acne, rash, or lesions  Neurological: Positive for loss of consciousness. Negative for dizziness, tremors, focal weakness and weakness.  Endo/Heme/Allergies: Negative for polydipsia. Does not bruise/bleed easily.  Psychiatric/Behavioral: Negative for depression. The patient is not nervous/anxious and does not have insomnia.      VITAL SIGNS:   Vitals:   06/16/17 2200 06/16/17 2215 06/16/17 2230 06/16/17 2245  BP: 126/71 (!) 110/99 119/69 128/75  Pulse: 77 76 75 75  Resp: 16 20    Temp:      TempSrc:      SpO2: 94% 94% 96% 95%  Weight:      Height:       Wt Readings from Last 3 Encounters:  06/16/17 75.8 kg (167 lb)  10/08/16 76.2 kg (168 lb)  03/09/16 78 kg (172 lb)    PHYSICAL EXAMINATION:  Physical Exam  Vitals reviewed. Constitutional: She is oriented to person, place, and time. She appears well-developed and well-nourished. No distress.  HENT:  Head: Normocephalic and atraumatic.  Mouth/Throat: Oropharynx is clear and moist.  Eyes: Pupils are equal, round, and reactive to light. Conjunctivae and EOM are normal. No scleral icterus.  Neck: Normal range of motion. Neck supple. No JVD present. No thyromegaly present.  Cardiovascular: Normal rate, regular rhythm and intact distal pulses.  Exam reveals no gallop and no friction rub.   No murmur heard. Respiratory: Effort normal and breath sounds normal. No respiratory distress. She has no wheezes. She has no rales.  GI: Soft. Bowel sounds are normal. She exhibits no distension. There is no tenderness.  Musculoskeletal: Normal range of motion. She exhibits no edema.  No arthritis, no gout  Lymphadenopathy:    She has no cervical adenopathy.  Neurological: She is alert and oriented to person, place, and time. No cranial nerve deficit.  No dysarthria, no  aphasia  Skin: Skin is warm and dry. No rash noted. No erythema.  Psychiatric: She has a normal mood and affect. Her behavior is normal. Judgment and thought content normal.    LABORATORY PANEL:   CBC  Recent Labs Lab 06/16/17 1706  WBC 6.3  HGB 13.5  HCT 39.9  PLT 149*   ------------------------------------------------------------------------------------------------------------------  Chemistries   Recent Labs Lab 06/16/17 1706  NA 139  K 3.7  CL 105  CO2 27  GLUCOSE 141*  BUN 11  CREATININE 0.93  CALCIUM 8.5*   ------------------------------------------------------------------------------------------------------------------  Cardiac Enzymes  Recent Labs Lab 06/16/17 2100  TROPONINI 0.51*   ------------------------------------------------------------------------------------------------------------------  RADIOLOGY:  Dg Chest 2 View  Result Date: 06/16/2017 CLINICAL DATA:  Syncopal episode EXAM: CHEST  2 VIEW COMPARISON:  03/09/2016 FINDINGS: Surgical clips at the right neck. No acute consolidation or pleural effusion. Normal cardiomediastinal silhouette with atherosclerosis. No pneumothorax. IMPRESSION: No active cardiopulmonary disease. Electronically Signed   By: Donavan Foil  M.D.   On: 06/16/2017 18:04   Dg Pelvis 1-2 Views  Result Date: 06/16/2017 CLINICAL DATA:  Syncopal episode EXAM: PELVIS - 1-2 VIEW COMPARISON:  None. FINDINGS: SI joints are symmetric. Vascular calcifications. Calcified pelvic phleboliths. Pubic symphysis is intact. The rami are within normal limits. No acute fracture or dislocation. IMPRESSION: No acute osseous abnormality. Electronically Signed   By: Donavan Foil M.D.   On: 06/16/2017 18:05   Ct Head Wo Contrast  Result Date: 06/16/2017 CLINICAL DATA:  Syncopal episode EXAM: CT HEAD WITHOUT CONTRAST TECHNIQUE: Contiguous axial images were obtained from the base of the skull through the vertex without intravenous contrast.  COMPARISON:  10/08/2016 FINDINGS: Brain: No evidence of acute infarction, hemorrhage, hydrocephalus, extra-axial collection or mass lesion/mass effect. Vascular: No hyperdense vessels.  Carotid artery calcifications. Skull: Normal. Negative for fracture or focal lesion. Sinuses/Orbits: Minimal mucosal thickening in the ethmoid sinuses. No acute orbital abnormality. Other: None IMPRESSION: No definite CT evidence for acute intracranial abnormality. Electronically Signed   By: Donavan Foil M.D.   On: 06/16/2017 18:09    EKG:   Orders placed or performed during the hospital encounter of 06/16/17  . ED EKG  . ED EKG  . EKG 12-Lead  . EKG 12-Lead    IMPRESSION AND PLAN:  Principal Problem:   Syncope and collapse - unclear etiology. Patient was drinking alcohol in the sun by a pool, and this may have contributed. However, patient does have an elevated troponin. See workup below. Active Problems:   Elevated troponin - serial cardiac enzymes tonight, echocardiogram in the morning with a cardiology consult   HTN (hypertension) - continue home meds   HLD (hyperlipidemia) - home dose antilipid   Depression - continue home dose antidepressant  All the records are reviewed and case discussed with ED provider. Management plans discussed with the patient and/or family.  DVT PROPHYLAXIS: SubQ lovenox  GI PROPHYLAXIS: None  ADMISSION STATUS: Observation  CODE STATUS: Full Code Status History    Date Active Date Inactive Code Status Order ID Comments User Context   03/10/2016  1:46 AM 03/10/2016  6:23 PM Full Code [id number]  Lance Coon, MD Inpatient    Advance Directive Documentation     Most Recent Value  Type of Advance Directive  Living will  Pre-existing out of facility DNR order (yellow form or pink MOST form)  -  "MOST" Form in Place?  -      TOTAL TIME TAKING CARE OF THIS PATIENT: 40 minutes.   Kea Callan [place] 06/16/2017, 11:15 PM  [employer]   [phone]  CC: Primary care physician; [facility]  Note:  This document was prepared using Dragon voice recognition software and may include unintentional dictation errors.

## 2017-06-16 NOTE — ED Triage Notes (Signed)
Pt to ED via EMS from the pool after syncopal episode today.  EMS states patient there for 2-3 hours and then passed out falling over another person.  Patient unsure of hitting head.  States pain to left flank area x1 week as well without urinary habit changes.  Patient drinking minimal water today and had a "small glass of wine".  Hx of pneumonia 2 weeks ago, COPD, lyme disease.  Pt given 200cc normal saline by EMS.  Presents A&Ox4, chest rise even and unlabored, skin warm and dry.

## 2017-06-16 NOTE — ED Notes (Signed)
Pt returned from CT at this time. This RN to bedside to introduce self to patient and assist to the bathroom.

## 2017-06-16 NOTE — ED Provider Notes (Signed)
[facility] Emergency Department Provider Note    First MD Initiated Contact with Patient 06/16/17 1650     (approximate)  I have reviewed the triage vital signs and the nursing notes.   HISTORY  Chief Complaint Loss of Consciousness    HPI Barbara Contreras is a 66 y.o. female presents after a witnessed syncopal event while she was at the pool drinking wine. She states that she did have a snack for breakfast this morning and had been having a glass of wine. States that she was sitting there did feel lightheaded and then lost consciousness. Denied any chest pain or shortness of breath. Didn't feel associated nausea. She fell over and did apparently hit her head and is complaining of mild headache and pelvic pain. Denies any chest pain or palpitations. No shortness of breath.No blood thinners.   Past Medical History:  Diagnosis Date  . Arthritis   . Asthma   . Carotid stenosis   . Depression   . Emphysema of lung ([place])   . Heart murmur   . HLD (hyperlipidemia)   . Hypertension   . IBS (irritable bowel syndrome)    Family History  Problem Relation Age of Onset  . Alcohol abuse Father   . Alzheimer's disease Father   . Anxiety disorder Father   . Depression Father   . Alcohol abuse Mother   . CAD Mother   . Diabetes Mother   . Hyperlipidemia Mother   . Depression Mother    Past Surgical History:  Procedure Laterality Date  . ABDOMINAL HYSTERECTOMY    . CAROTID ENDARTERECTOMY    . CHOLECYSTECTOMY     Patient Active Problem List   Diagnosis Date Noted  . Syncope and collapse 03/09/2016  . HTN (hypertension) 03/09/2016  . HLD (hyperlipidemia) 03/09/2016  . Depression 03/09/2016  . Heart murmur 03/09/2016      Prior to Admission medications   Medication Sig Start Date End Date Taking? Authorizing Provider  atorvastatin (LIPITOR) 40 MG tablet Take 40 mg by mouth daily. 02/17/16  Yes [provider]  fluticasone (FLONASE) 50  MCG/ACT nasal spray Place 2 sprays into both nostrils daily. 02/15/16  Yes [provider]  gabapentin (NEURONTIN) 600 MG tablet Take 600 mg by mouth 3 (three) times daily. 01/24/16  Yes [provider]  Multiple Vitamin (MULTIVITAMIN WITH MINERALS) TABS tablet Take 1 tablet by mouth daily.   Yes [provider]  naproxen (NAPROSYN) 500 MG tablet Take 500 mg by mouth 2 (two) times daily. 05/22/17  Yes [provider]  triamterene-hydrochlorothiazide (DYAZIDE) 37.5-25 MG capsule Take 1 capsule by mouth every morning. 06/03/17  Yes [provider]  venlafaxine XR (EFFEXOR-XR) 150 MG 24 hr capsule Take 150 mg by mouth daily with breakfast.   Yes [provider]    Allergies Morphine and related and Penicillins    Social History Social History  Substance Use Topics  . Smoking status: Current Every Day Smoker    Packs/day: 0.50    Types: Cigarettes  . Smokeless tobacco: Never Used  . Alcohol use 0.0 oz/week    Review of Systems Patient denies headaches, rhinorrhea, blurry vision, numbness, shortness of breath, chest pain, edema, cough, abdominal pain, nausea, vomiting, diarrhea, dysuria, fevers, rashes or hallucinations unless otherwise stated above in HPI. ____________________________________________   PHYSICAL EXAM:  VITAL SIGNS: Vitals:   06/16/17 2045 06/16/17 2100  BP: 134/69 135/74  Pulse: 71 70  Resp: 19 13  Temp:  Constitutional: Alert and oriented. Mildly intoxicated but in no acute distress. Eyes: Conjunctivae are normal.  Head: Atraumatic. Nose: No congestion/rhinnorhea. Mouth/Throat: Mucous membranes are moist.   Neck: No stridor. Painless ROM.  Cardiovascular: Normal rate, regular rhythm. Grossly normal heart sounds.  Good peripheral circulation. Respiratory: Normal respiratory effort.  No retractions. Lungs CTAB. Gastrointestinal: Soft and nontender. No distention. No abdominal bruits. No CVA  tenderness. Musculoskeletal: No lower extremity tenderness nor edema.  No joint effusions. Neurologic:  Normal speech and language. No gross focal neurologic deficits are appreciated. No facial droop Skin:  Skin is warm, dry and intact. No rash noted. Psychiatric: Mood and affect are normal. Speech and behavior are normal. ____________________________________________   LABS (all labs ordered are listed, but only abnormal results are displayed)  Results for orders placed or performed during the hospital encounter of 06/16/17 (from the past 24 hour(s))  Basic metabolic panel     Status: Abnormal   Collection Time: 06/16/17  5:06 PM  Result Value Ref Range   Sodium 139 135 - 145 mmol/L   Potassium 3.7 3.5 - 5.1 mmol/L   Chloride 105 101 - 111 mmol/L   CO2 27 22 - 32 mmol/L   Glucose, Bld 141 (H) 65 - 99 mg/dL   BUN 11 6 - 20 mg/dL   Creatinine, Ser 0.93 0.44 - 1.00 mg/dL   Calcium 8.5 (L) 8.9 - 10.3 mg/dL   GFR calc non Af Amer >60 >60 mL/min   GFR calc Af Amer >60 >60 mL/min   Anion gap 7 5 - 15  CBC     Status: Abnormal   Collection Time: 06/16/17  5:06 PM  Result Value Ref Range   WBC 6.3 3.6 - 11.0 K/uL   RBC 4.23 3.80 - 5.20 MIL/uL   Hemoglobin 13.5 12.0 - 16.0 g/dL   HCT 39.9 35.0 - 47.0 %   MCV 94.3 80.0 - 100.0 fL   MCH 32.0 26.0 - 34.0 pg   MCHC 34.0 32.0 - 36.0 g/dL   RDW 13.4 11.5 - 14.5 %   Platelets 149 (L) 150 - 440 K/uL  Urinalysis, Complete w Microscopic     Status: Abnormal   Collection Time: 06/16/17  5:06 PM  Result Value Ref Range   Color, Urine YELLOW (A) YELLOW   APPearance CLEAR (A) CLEAR   Specific Gravity, Urine 1.016 1.005 - 1.030   pH 6.0 5.0 - 8.0   Glucose, UA NEGATIVE NEGATIVE mg/dL   Hgb urine dipstick NEGATIVE NEGATIVE   Bilirubin Urine NEGATIVE NEGATIVE   Ketones, ur NEGATIVE NEGATIVE mg/dL   Protein, ur NEGATIVE NEGATIVE mg/dL   Nitrite NEGATIVE NEGATIVE   Leukocytes, UA NEGATIVE NEGATIVE   RBC / HPF 0-5 0 - 5 RBC/hpf   WBC, UA 0-5 0  - 5 WBC/hpf   Bacteria, UA NONE SEEN NONE SEEN   Squamous Epithelial / LPF 0-5 (A) NONE SEEN   Mucous PRESENT    Hyaline Casts, UA PRESENT    Granular Casts, UA PRESENT   Troponin I     Status: Abnormal   Collection Time: 06/16/17  5:06 PM  Result Value Ref Range   Troponin I 0.03 (HH) <0.03 ng/mL  Ethanol     Status: Abnormal   Collection Time: 06/16/17  5:06 PM  Result Value Ref Range   Alcohol, Ethyl (B) 32 (H) <5 mg/dL  Urine Drug Screen, Qualitative ([facility] only)     Status: Abnormal   Collection Time: 06/16/17  5:06 PM  Result Value Ref Range   Tricyclic, Ur Screen NONE DETECTED NONE DETECTED   Amphetamines, Ur Screen NONE DETECTED NONE DETECTED   MDMA (Ecstasy)Ur Screen NONE DETECTED NONE DETECTED   Cocaine Metabolite,Ur [place] NONE DETECTED NONE DETECTED   Opiate, Ur Screen NONE DETECTED NONE DETECTED   Phencyclidine (PCP) Ur S NONE DETECTED NONE DETECTED   Cannabinoid 50 Ng, Ur [place] POSITIVE (A) NONE DETECTED   Barbiturates, Ur Screen NONE DETECTED NONE DETECTED   Benzodiazepine, Ur Scrn NONE DETECTED NONE DETECTED   Methadone Scn, Ur NONE DETECTED NONE DETECTED  Glucose, capillary     Status: Abnormal   Collection Time: 06/16/17  5:16 PM  Result Value Ref Range   Glucose-Capillary 142 (H) 65 - 99 mg/dL   ____________________________________________  EKG My review and personal interpretation at Time: 16:56   Indication: syncope  Rate: 80  Rhythm: sinus Axis: normal Other: non specific st changes, t wave inversion in I and aVL unchanged from previous 11/17  no stemi ____________________________________________  RADIOLOGY  I personally reviewed all radiographic images ordered to evaluate for the above acute complaints and reviewed radiology reports and findings.  These findings were personally discussed with the patient.  Please see medical record for radiology report.  ____________________________________________   PROCEDURES  Procedure(s) performed:   Procedures    Critical Care performed: no ____________________________________________   INITIAL IMPRESSION / ASSESSMENT AND PLAN / ED COURSE  Pertinent labs & imaging results that were available during my care of the patient were reviewed by me and considered in my medical decision making (see chart for details).  DDX: dysrhtyhmia, dehydration, intoxication, heat syncope, acs, ich  Phoebie Shad is a 66 y.o. who presents to the ED with syncopal event while at the pool today. CT imaging and radiographic imaging ordered to evaluate for traumatic injury shows none. Patient denies any chest pain or shortness of breath. She is otherwise well-appearing does admit to drinking alcohol today. Does have mild dehydration with low BP.  Otherwise well appearing.  The patient will be placed on continuous pulse oximetry and telemetry for monitoring.  Laboratory evaluation will be sent to evaluate for the above complaints.     Clinical Course as of Jun 17 2123  Sat Jun 16, 2017  0272 Patient without any orthostasis. UDS is positive for marijuana and patient with mildly elevated alcohol. We'll continue observed. Denies any symptoms at this point. Radiographic imaging is reassuring.  [PR]  2048 This patient's presentation and syncopal event does seem that she likely had some component of dehydration. Patient with mild EKG changes that do not appear significantly changed in morphology from previous. She is up ambulating about the room without any symptoms. Patient requesting discharge home. I encouraged patient to stay for further risk stratification with repeat troponin. Patient states that she's had multiple syncopal episodes like this in the past really never found an etiology but she otherwise feels fine. I did discuss the mildly elevated troponin level with her and encouraged her to stay for repeat enzyme which she agrees to do. Patient will be signed out to Dr. Jimmye Norman pending repeat troponin.  Anticipate discharge home pending reassessment and repeat troponin.  [PR]    Clinical Course User Index [PR] Merlyn Lot, MD     ____________________________________________   FINAL CLINICAL IMPRESSION(S) / ED DIAGNOSES  Final diagnoses:  Fall  Dehydration  Fainting spell      NEW MEDICATIONS STARTED DURING THIS VISIT:  New Prescriptions   No medications on file  Note:  This document was prepared using Dragon voice recognition software and may include unintentional dictation errors.    Merlyn Lot, MD 06/16/17 2124

## 2017-06-16 NOTE — ED Notes (Signed)
Date and time results received: 06/16/17 1840   Test: Trop  Critical Value: 0.03  Name of Provider Notified: Dr. Quentin Cornwall  Orders Received? Or Actions Taken?: Critical Results Acknowledged

## 2017-06-16 NOTE — ED Provider Notes (Signed)
Labs Reviewed  BASIC METABOLIC PANEL - Abnormal; Notable for the following:       Result Value   Glucose, Bld 141 (*)    Calcium 8.5 (*)    All other components within normal limits  CBC - Abnormal; Notable for the following:    Platelets 149 (*)    All other components within normal limits  URINALYSIS, COMPLETE (UACMP) WITH MICROSCOPIC - Abnormal; Notable for the following:    Color, Urine YELLOW (*)    APPearance CLEAR (*)    Squamous Epithelial / LPF 0-5 (*)    All other components within normal limits  GLUCOSE, CAPILLARY - Abnormal; Notable for the following:    Glucose-Capillary 142 (*)    All other components within normal limits  TROPONIN I - Abnormal; Notable for the following:    Troponin I 0.03 (*)    All other components within normal limits  ETHANOL - Abnormal; Notable for the following:    Alcohol, Ethyl (B) 32 (*)    All other components within normal limits  URINE DRUG SCREEN, QUALITATIVE ([facility] ONLY) - Abnormal; Notable for the following:    Cannabinoid 50 Ng, Ur [place] POSITIVE (*)    All other components within normal limits  TROPONIN I  CBG MONITORING, ED  Repeat troponin is 0.51. I will discuss with the hospitalist for observation.    Earleen Newport, MD 06/16/17 [phone]

## 2017-06-16 NOTE — ED Notes (Signed)
Report from susan, rn.  

## 2017-06-16 NOTE — ED Notes (Signed)
Pt report received. Pt's fluids almost complete. Pt ambulated to toilet with steady gait and placed back onto monitor.

## 2017-06-16 NOTE — ED Notes (Signed)
Troponin drawn. Pt given p.butter, gr crackers, saltines, apple sauce and water at pt's request.

## 2017-06-17 ENCOUNTER — Observation Stay
Admit: 2017-06-17 | Discharge: 2017-06-17 | Disposition: A | Discharge status: Home / Self Care | Payer: Medicare Other | Attending: Internal Medicine | Admitting: Internal Medicine

## 2017-06-17 DIAGNOSIS — R55 Syncope and collapse: Secondary | ICD-10-CM | POA: Diagnosis not present

## 2017-06-17 LAB — BASIC METABOLIC PANEL
Anion gap: 5 (ref 5–15)
BUN: 9 mg/dL (ref 6–20)
CO2: 30 mmol/L (ref 22–32)
Calcium: 8.6 mg/dL — ABNORMAL LOW (ref 8.9–10.3)
Chloride: 108 mmol/L (ref 101–111)
Creatinine, Ser: 0.74 mg/dL (ref 0.44–1.00)
GFR calc Af Amer: >60 mL/min (ref >60)
GFR calc non Af Amer: >60 mL/min (ref >60)
Glucose, Bld: 98 mg/dL (ref 65–99)
Potassium: 3.9 mmol/L (ref 3.5–5.1)
SODIUM: 143 mmol/L (ref 135–145)

## 2017-06-17 LAB — CBC
HCT: 40.3 % (ref 35.0–47.0)
Hemoglobin: 13.8 g/dL (ref 12.0–16.0)
MCH: 32 pg (ref 26.0–34.0)
MCHC: 34.3 g/dL (ref 32.0–36.0)
MCV: 93.3 fL (ref 80.0–100.0)
Platelets: 150 10*3/uL (ref 150–440)
RBC: 4.32 MIL/uL (ref 3.80–5.20)
RDW: 13.8 % (ref 11.5–14.5)
WBC: 7.6 10*3/uL (ref 3.6–11.0)

## 2017-06-17 LAB — TROPONIN I
Troponin I: <0.03 ng/mL (ref <0.03)
Troponin I: <0.03 ng/mL (ref <0.03)

## 2017-06-17 LAB — ECHOCARDIOGRAM COMPLETE
Height: 65 in
Weight: 2657.6 oz

## 2017-06-17 MED ORDER — ONDANSETRON HCL 4 MG PO TABS: 4.0000 mg | ORAL_TABLET | Freq: Four times a day (QID) | ORAL | Status: DC | PRN

## 2017-06-17 MED ORDER — GABAPENTIN 600 MG PO TABS
600.0000 mg | ORAL_TABLET | Freq: Three times a day (TID) | ORAL | Status: DC
  Administered 2017-06-17: 600 mg via ORAL
  Filled 2017-06-17: qty 1

## 2017-06-17 MED ORDER — ATORVASTATIN CALCIUM 20 MG PO TABS
40.0000 mg | ORAL_TABLET | Freq: Every day | ORAL | Status: DC
  Administered 2017-06-17: 40 mg via ORAL
  Filled 2017-06-17: qty 2

## 2017-06-17 MED ORDER — AMLODIPINE BESYLATE 5 MG PO TABS
5.0000 mg | ORAL_TABLET | Freq: Every day | ORAL | Status: DC
Start: 2017-06-17 — End: 2017-06-17
  Administered 2017-06-17: 5 mg via ORAL
  Filled 2017-06-17: qty 1

## 2017-06-17 MED ORDER — ACETAMINOPHEN 325 MG PO TABS: 650.0000 mg | ORAL_TABLET | Freq: Four times a day (QID) | ORAL | Status: DC | PRN

## 2017-06-17 MED ORDER — ACETAMINOPHEN 650 MG RE SUPP
650.0000 mg | Freq: Four times a day (QID) | RECTAL | Status: DC | PRN
Start: 2017-06-17 — End: 2017-06-17

## 2017-06-17 MED ORDER — VENLAFAXINE HCL ER 75 MG PO CP24
150.0000 mg | ORAL_CAPSULE | Freq: Every day | ORAL | Status: DC
  Administered 2017-06-17: 150 mg via ORAL
  Filled 2017-06-17: qty 2

## 2017-06-17 MED ORDER — ONDANSETRON HCL 4 MG/2ML IJ SOLN: 4.0000 mg | Freq: Four times a day (QID) | INTRAMUSCULAR | Status: DC | PRN

## 2017-06-17 MED ORDER — ENOXAPARIN SODIUM 40 MG/0.4ML ~~LOC~~ SOLN: 40.0000 mg | SUBCUTANEOUS | Status: DC

## 2017-06-17 NOTE — Care Management Obs Status (Signed)
[place] NOTIFICATION   Patient Details  Name: Anysha Frappier MRN: [id number] Date of Birth: [birth year]   Medicare Observation Status Notification Given:  No [facility] Psychiatric Letter not given per D/Ced within 24 hours of admission.)    Joran Kallal A, RN 06/17/2017, 12:05 PM

## 2017-06-17 NOTE — Consult Note (Signed)
[facility] Consultation Note  Patient ID: Barbara Contreras, MRN: [id number], DOB/AGE: [birth year] 66 y.o. Admit date: 06/16/2017   Date of Consult: 06/17/2017 Primary Physician: [facility] Primary Cardiologist: Nehemiah Massed  Chief Complaint:  Chief Complaint  Patient presents with  . Loss of Consciousness   Reason for Consult: syncope  HPI: 66 y.o. female with known carotid atherosclerosis status post previous carotid endarterectomy essential hypertension mixed hyperlipidemia having recurrent episodes of the syncope. The patient has had 5 episodes of syncope of which was typical at this time. The patient had been talking with her friend when she completely blacked out. She had no presyncopal symptoms and after her syncope was feeling weak and fatigued and short of breath but no evidence of chest discomfort. She does have occasional episodes of chest discomfort with the physical activity as well as heavy meals a for which she has to sit down and lie down. The patient has had recent evaluation for peripheral vascular disease with a normal ABI and a stress test last year showing normal myocardial perfusion. These symptoms appear to be worsening and consistent with possible cardiovascular cause. She does have an abnormal EKG showing normal sinus rhythm and left atrial enlargement and lateral T-wave inversion and an elevated troponin 0.51 consistent with possible non-ST elevation myocardial infarction. Patient is comfortable at this time on appropriate medications  Past Medical History:  Diagnosis Date  . Arthritis   . Asthma   . Carotid stenosis   . Depression   . Emphysema of lung ([place])   . Heart murmur   . HLD (hyperlipidemia)   . Hypertension   . IBS (irritable bowel syndrome)       Surgical History:  Past Surgical History:  Procedure Laterality Date  . ABDOMINAL HYSTERECTOMY    . CAROTID ENDARTERECTOMY    . CHOLECYSTECTOMY       Home Meds: Prior to  Admission medications   Medication Sig Start Date End Date Taking? Authorizing Provider  atorvastatin (LIPITOR) 40 MG tablet Take 40 mg by mouth daily. 02/17/16  Yes [provider]  fluticasone (FLONASE) 50 MCG/ACT nasal spray Place 2 sprays into both nostrils daily. 02/15/16  Yes [provider]  gabapentin (NEURONTIN) 600 MG tablet Take 600 mg by mouth 3 (three) times daily. 01/24/16  Yes [provider]  Multiple Vitamin (MULTIVITAMIN WITH MINERALS) TABS tablet Take 1 tablet by mouth daily.   Yes [provider]  naproxen (NAPROSYN) 500 MG tablet Take 500 mg by mouth 2 (two) times daily. 05/22/17  Yes [provider]  triamterene-hydrochlorothiazide (DYAZIDE) 37.5-25 MG capsule Take 1 capsule by mouth every morning. 06/03/17  Yes [provider]  venlafaxine XR (EFFEXOR-XR) 150 MG 24 hr capsule Take 150 mg by mouth daily with breakfast.   Yes [provider]    Inpatient Medications:  . amLODipine  5 mg Oral Daily  . atorvastatin  40 mg Oral Daily  . enoxaparin (LOVENOX) injection  40 mg Subcutaneous Q24H  . gabapentin  600 mg Oral TID  . venlafaxine XR  150 mg Oral Q breakfast     Allergies:  Allergies  Allergen Reactions  . Morphine And Related Itching  . Penicillins Other (See Comments)    Reaction: unknown Has patient had a PCN reaction causing immediate rash, facial/tongue/throat swelling, SOB or lightheadedness with hypotension: Unknown Has patient had a PCN reaction causing severe rash involving mucus membranes or skin necrosis: Unknown Has patient had a PCN reaction that required  hospitalization: Unknown Has patient had a PCN reaction occurring within the last 10 years: no If all of the above answers are "NO", then may proceed with Cephalosporin use. .    Social History   Social History  . Marital status: Divorced    Spouse name: N/A  . Number of children: N/A  . Years of education: N/A   Occupational  History  . Not on file.   Social History Main Topics  . Smoking status: Current Every Day Smoker    Packs/day: 0.50    Types: Cigarettes  . Smokeless tobacco: Never Used  . Alcohol use 0.0 oz/week  . Drug use: No  . Sexual activity: Not on file   Other Topics Concern  . Not on file   Social History Narrative  . No narrative on file     Family History  Problem Relation Age of Onset  . Alcohol abuse Father   . Alzheimer's disease Father   . Anxiety disorder Father   . Depression Father   . Alcohol abuse Mother   . CAD Mother   . Diabetes Mother   . Hyperlipidemia Mother   . Depression Mother      Review of Systems Positive for Syncope Negative for: General:  chills, fever, night sweats or weight changes.  Cardiovascular: PND orthopnea positive for syncope dizziness  Dermatological skin lesions rashes Respiratory: Cough congestion Urologic: Frequent urination urination at night and hematuria Abdominal: negative for nausea, vomiting, diarrhea, bright red blood per rectum, melena, or hematemesis Neurologic: negative for visual changes, and/or hearing changes  All other systems reviewed and are otherwise negative except as noted above.  Labs:  Recent Labs  06/16/17 1706 06/16/17 2100 06/17/17 0302  TROPONINI 0.03* 0.51* <0.03   Lab Results  Component Value Date   WBC 7.6 06/17/2017   HGB 13.8 06/17/2017   HCT 40.3 06/17/2017   MCV 93.3 06/17/2017   PLT 150 06/17/2017    Recent Labs Lab 06/17/17 0302  NA 143  K 3.9  CL 108  CO2 30  BUN 9  CREATININE 0.74  CALCIUM 8.6*  GLUCOSE 98   No results found for: CHOL, HDL, LDLCALC, TRIG No results found for: DDIMER  Radiology/Studies:  Dg Chest 2 View  Result Date: 06/16/2017 CLINICAL DATA:  Syncopal episode EXAM: CHEST  2 VIEW COMPARISON:  03/09/2016 FINDINGS: Surgical clips at the right neck. No acute consolidation or pleural effusion. Normal cardiomediastinal silhouette with atherosclerosis. No  pneumothorax. IMPRESSION: No active cardiopulmonary disease. Electronically Signed   By: Donavan Foil M.D.   On: 06/16/2017 18:04   Dg Pelvis 1-2 Views  Result Date: 06/16/2017 CLINICAL DATA:  Syncopal episode EXAM: PELVIS - 1-2 VIEW COMPARISON:  None. FINDINGS: SI joints are symmetric. Vascular calcifications. Calcified pelvic phleboliths. Pubic symphysis is intact. The rami are within normal limits. No acute fracture or dislocation. IMPRESSION: No acute osseous abnormality. Electronically Signed   By: Donavan Foil M.D.   On: 06/16/2017 18:05   Ct Head Wo Contrast  Result Date: 06/16/2017 CLINICAL DATA:  Syncopal episode EXAM: CT HEAD WITHOUT CONTRAST TECHNIQUE: Contiguous axial images were obtained from the base of the skull through the vertex without intravenous contrast. COMPARISON:  10/08/2016 FINDINGS: Brain: No evidence of acute infarction, hemorrhage, hydrocephalus, extra-axial collection or mass lesion/mass effect. Vascular: No hyperdense vessels.  Carotid artery calcifications. Skull: Normal. Negative for fracture or focal lesion. Sinuses/Orbits: Minimal mucosal thickening in the ethmoid sinuses. No acute orbital abnormality. Other: None IMPRESSION: No definite CT  evidence for acute intracranial abnormality. Electronically Signed   By: Donavan Foil M.D.   On: 06/16/2017 18:09    EKG: Normal sinus rhythm left atrial enlargement and lateral T-wave inversion  Weights: Filed Weights   06/16/17 1658 06/17/17 0044  Weight: 75.8 kg (167 lb) 75.3 kg (166 lb 1.6 oz)     Physical Exam: Blood pressure (!) 166/70, pulse 67, temperature 97.6 F (36.4 C), temperature source Oral, resp. rate 18, height 5\' 5"  (1.651 m), weight 75.3 kg (166 lb 1.6 oz), SpO2 95 %. Body mass index is 27.64 kg/m. General: Well developed, well nourished, in no acute distress. Head eyes ears nose throat: Normocephalic, atraumatic, sclera non-icteric, no xanthomas, nares are without discharge. No apparent thyromegaly  and/or mass  Lungs: Normal respiratory effort.  no wheezes, no rales, no rhonchi.  Heart: RRR with normal S1 S2. no murmur gallop, no rub, PMI is normal size and placement, carotid upstroke normal with  bruit, jugular venous pressure is normal Abdomen:  non-tender, non-distended with normoactive bowel sounds. No hepatomegaly. No rebound/guarding. No obvious abdominal masses. Abdominal aorta is normal size without bruit Extremities: No edema. no cyanosis, no clubbing, no ulcers  Peripheral : 2+ bilateral upper extremity pulses, 2+ bilateral femoral pulses, 2+ bilateral dorsal pedal pulse Neuro: Alert and oriented. No facial asymmetry. No focal deficit. Moves all extremities spontaneously. Musculoskeletal: Normal muscle tone without kyphosis Psych:  Responds to questions appropriately with a normal affect.    Assessment: 66 year old female with peripheral vascular disease carotid atherosclerosis status post endarterectomy essential hypertension mixed hyperlipidemia with recurrent episodes syncope as well as some subtle episodes of chest discomfort with physical activity consistent with a cardiovascular cause  Plan: 1. Continue serial ECG and enzymes to assess for possible myocardial infarction 2. Continue high intensity cholesterol therapy for peripheral vascular disease 3. Amlodipine for hypertension control and treatment of risk factor car vascular disease and consider that it may be consistent with episodes of syncope as a side effect 4. Repeat carotid assessment to assess further evaluate the significance of carotid disease causing recurrent syncope 5. Further evaluation of possibility of the subclavian steal as a cause of recurrent episodes of syncope 6. Cardiac catheterization to assess coronary anatomy and elevated troponin syncopal episodes and aortic valve stenosis. Patient understands risk and benefits of cardiac catheterization. This includes a possibility does stroke heart attack  infection bleeding or blood clot. She is at low risk for conscious sedation  Signed, Corey Skains M.D. [facility] 06/17/2017, 7:54 AM

## 2017-06-17 NOTE — Progress Notes (Signed)
Discharged to home with friend. Will be followed by Neurology

## 2017-06-17 NOTE — Progress Notes (Signed)
*  PRELIMINARY RESULTS* Echocardiogram 2D Echocardiogram has been performed.  Barbara Contreras Barbara Contreras 06/17/2017, 8:20 AM

## 2017-06-17 NOTE — Discharge Summary (Addendum)
[place] [facility] [place] NAME: Barbara Contreras    MR#:  [id number]  DATE OF BIRTH:  Feb 24, 1951  DATE OF ADMISSION:  06/16/2017   ADMITTING PHYSICIAN: Lance Coon, MD  DATE OF DISCHARGE: 06/17/2017  PRIMARY CARE PHYSICIAN: Shari Prows, [facility]   ADMISSION DIAGNOSIS:   Dehydration [E86.0] Fall [W19.XXXA] Fainting spell [R55] Elevated troponin I level [R74.8]  DISCHARGE DIAGNOSIS:   Principal Problem:   Syncope and collapse Active Problems:   HTN (hypertension)   HLD (hyperlipidemia)   Depression   Elevated troponin   Syncope   SECONDARY DIAGNOSIS:   Past Medical History:  Diagnosis Date  . Arthritis   . Asthma   . Carotid stenosis   . Depression   . Emphysema of lung ([place])   . Heart murmur   . HLD (hyperlipidemia)   . Hypertension   . IBS (irritable bowel syndrome)     HOSPITAL COURSE:   65 year old female with past medical history significant for COPD, hypertension, IBS, Lyme's disease presents to the hospital after a syncopal episode.  #1 syncope-likely from dehydration/vasovagal syncope. -Patient was out in the heat, was drinking alcohol with elevated blood alcohol level and also had marijuana in her urine tox screen. -Was hypotensive when she came to the ER. With IV fluids, as have recurred. No evidence of any seizure-like activity. - Nuclear medicine stress test and carotid Dopplers were negative from October 2017 done again for syncope at the time. So they were not repeated again. -CT of the head negative for any acute findings  #2 arthritis and pain-continue naproxen and gabapentin  #3 hypertension-on triamterene, hydrochlorothiazide  #4 depression-on venlafaxine  Feels stable, ambulated fine. Being discharged today  DISCHARGE CONDITIONS:   Stable  CONSULTS OBTAINED:   Treatment Team:  Corey Skains, MD  DRUG ALLERGIES:   Allergies  Allergen Reactions  . Morphine And Related  Itching  . Penicillins Other (See Comments)    Reaction: unknown Has patient had a PCN reaction causing immediate rash, facial/tongue/throat swelling, SOB or lightheadedness with hypotension: Unknown Has patient had a PCN reaction causing severe rash involving mucus membranes or skin necrosis: Unknown Has patient had a PCN reaction that required hospitalization: Unknown Has patient had a PCN reaction occurring within the last 10 years: no If all of the above answers are "NO", then may proceed with Cephalosporin use. Marland Kitchen   DISCHARGE MEDICATIONS:   Allergies as of 06/17/2017      Reactions   Morphine And Related Itching   Penicillins Other (See Comments)   Reaction: unknown Has patient had a PCN reaction causing immediate rash, facial/tongue/throat swelling, SOB or lightheadedness with hypotension: Unknown Has patient had a PCN reaction causing severe rash involving mucus membranes or skin necrosis: Unknown Has patient had a PCN reaction that required hospitalization: Unknown Has patient had a PCN reaction occurring within the last 10 years: no If all of the above answers are "NO", then may proceed with Cephalosporin use. .      Medication List    TAKE these medications   atorvastatin 40 MG tablet Commonly known as:  LIPITOR Take 40 mg by mouth daily.   fluticasone 50 MCG/ACT nasal spray Commonly known as:  FLONASE Place 2 sprays into both nostrils daily.   gabapentin 600 MG tablet Commonly known as:  NEURONTIN Take 600 mg by mouth 3 (three) times daily.   multivitamin with minerals Tabs tablet Take 1 tablet by mouth daily.  naproxen 500 MG tablet Commonly known as:  NAPROSYN Take 500 mg by mouth 2 (two) times daily.   triamterene-hydrochlorothiazide 37.5-25 MG capsule Commonly known as:  DYAZIDE Take 1 capsule by mouth every morning.   venlafaxine XR 150 MG 24 hr capsule Commonly known as:  EFFEXOR-XR Take 150 mg by mouth daily with breakfast.        DISCHARGE  INSTRUCTIONS:   1. PCP follow-up in 1-2 weeks 2. Neurology follow-up in 1-2 weeks  DIET:   Cardiac diet  ACTIVITY:   Activity as tolerated  OXYGEN:   Home Oxygen: No.  Oxygen Delivery: room air  DISCHARGE LOCATION:   home   If you experience worsening of your admission symptoms, develop shortness of breath, life threatening emergency, suicidal or homicidal thoughts you must seek medical attention immediately by calling 911 or calling your MD immediately  if symptoms less severe.  You Must read complete instructions/literature along with all the possible adverse reactions/side effects for all the Medicines you take and that have been prescribed to you. Take any new Medicines after you have completely understood and accpet all the possible adverse reactions/side effects.   Please note  You were cared for by a hospitalist during your hospital stay. If you have any questions about your discharge medications or the care you received while you were in the hospital after you are discharged, you can call the unit and asked to speak with the hospitalist on call if the hospitalist that took care of you is not available. Once you are discharged, your primary care physician will handle any further medical issues. Please note that NO REFILLS for any discharge medications will be authorized once you are discharged, as it is imperative that you return to your primary care physician (or establish a relationship with a primary care physician if you do not have one) for your aftercare needs so that they can reassess your need for medications and monitor your lab values.    On the day of Discharge:  VITAL SIGNS:   Blood pressure (!) 154/81, pulse 81, temperature 98.2 F (36.8 C), temperature source Oral, resp. rate 18, height 5\' 5"  (1.651 m), weight 75.3 kg (166 lb 1.6 oz), SpO2 94 %.  PHYSICAL EXAMINATION:    GENERAL:  66 y.o.-year-old patient lying in the bed with no acute distress.  EYES:  Pupils equal, round, reactive to light and accommodation. No scleral icterus. Extraocular muscles intact.  HEENT: Head atraumatic, normocephalic. Oropharynx and nasopharynx clear.  NECK:  Supple, no jugular venous distention. No thyroid enlargement, no tenderness.  LUNGS: Normal breath sounds bilaterally, no wheezing, rales,rhonchi or crepitation. No use of accessory muscles of respiration.  CARDIOVASCULAR: S1, S2 normal. No murmurs, rubs, or gallops.  ABDOMEN: Soft, non-tender, non-distended. Bowel sounds present. No organomegaly or mass.  EXTREMITIES: No pedal edema, cyanosis, or clubbing.  NEUROLOGIC: Cranial nerves II through XII are intact. Muscle strength 5/5 in all extremities. Sensation intact. Gait not checked.  PSYCHIATRIC: The patient is alert and oriented x 3.  SKIN: No obvious rash, lesion, or ulcer.   DATA REVIEW:   CBC  Recent Labs Lab 06/17/17 0302  WBC 7.6  HGB 13.8  HCT 40.3  PLT 150    Chemistries   Recent Labs Lab 06/17/17 0302  NA 143  K 3.9  CL 108  CO2 30  GLUCOSE 98  BUN 9  CREATININE 0.74  CALCIUM 8.6*     Microbiology Results  No results found for this or any  previous visit.  RADIOLOGY:  Dg Chest 2 View  Result Date: 06/16/2017 CLINICAL DATA:  Syncopal episode EXAM: CHEST  2 VIEW COMPARISON:  03/09/2016 FINDINGS: Surgical clips at the right neck. No acute consolidation or pleural effusion. Normal cardiomediastinal silhouette with atherosclerosis. No pneumothorax. IMPRESSION: No active cardiopulmonary disease. Electronically Signed   By: Donavan Foil M.D.   On: 06/16/2017 18:04   Dg Pelvis 1-2 Views  Result Date: 06/16/2017 CLINICAL DATA:  Syncopal episode EXAM: PELVIS - 1-2 VIEW COMPARISON:  None. FINDINGS: SI joints are symmetric. Vascular calcifications. Calcified pelvic phleboliths. Pubic symphysis is intact. The rami are within normal limits. No acute fracture or dislocation. IMPRESSION: No acute osseous abnormality. Electronically  Signed   By: Donavan Foil M.D.   On: 06/16/2017 18:05   Ct Head Wo Contrast  Result Date: 06/16/2017 CLINICAL DATA:  Syncopal episode EXAM: CT HEAD WITHOUT CONTRAST TECHNIQUE: Contiguous axial images were obtained from the base of the skull through the vertex without intravenous contrast. COMPARISON:  10/08/2016 FINDINGS: Brain: No evidence of acute infarction, hemorrhage, hydrocephalus, extra-axial collection or mass lesion/mass effect. Vascular: No hyperdense vessels.  Carotid artery calcifications. Skull: Normal. Negative for fracture or focal lesion. Sinuses/Orbits: Minimal mucosal thickening in the ethmoid sinuses. No acute orbital abnormality. Other: None IMPRESSION: No definite CT evidence for acute intracranial abnormality. Electronically Signed   By: Donavan Foil M.D.   On: 06/16/2017 18:09     Management plans discussed with the patient, family and they are in agreement.  CODE STATUS:     Code Status Orders        Start     Ordered   06/17/17 0031  Full code  Continuous     06/17/17 0032    Code Status History    Date Active Date Inactive Code Status Order ID Comments User Context   03/10/2016  1:46 AM 03/10/2016  6:23 PM Full Code [id number]  Lance Coon, MD Inpatient    Advance Directive Documentation     Most Recent Value  Type of Advance Directive  Living will  Pre-existing out of facility DNR order (yellow form or pink MOST form)  -  "MOST" Form in Place?  -      TOTAL TIME TAKING CARE OF THIS PATIENT: 36 minutes.    Gladstone Lighter M.D on 06/17/2017 at 12:08 PM  Between 7am to 6pm - Pager - [phone]  After 6pm go to [email or web address] - Proofreader  [facility]  Office  [phone]  CC: Primary care physician; [facility]   Note: This dictation was prepared with Dragon dictation along with smaller phrase technology. Any transcriptional errors that result from this process are unintentional.

## 2017-06-17 NOTE — Plan of Care (Signed)
Problem: Health Behavior/Discharge Planning: Goal: Ability to manage health-related needs will improve Outcome: Completed/Met Date Met: 06/17/17 Discharge ed re meds, follow up and instructions

## 2017-06-18 SURGERY — LEFT HEART CATH AND CORONARY ANGIOGRAPHY
Anesthesia: Moderate Sedation

## 2018-07-12 ENCOUNTER — Telehealth: Payer: Self-pay | Admitting: *Deleted

## 2018-07-12 DIAGNOSIS — Z122 Encounter for screening for malignant neoplasm of respiratory organs: Secondary | ICD-10-CM

## 2018-07-12 DIAGNOSIS — Z87891 Personal history of nicotine dependence: Secondary | ICD-10-CM

## 2018-07-12 NOTE — Telephone Encounter (Signed)
Received referral for low dose lung cancer screening CT scan. Message left at phone number listed in EMR for patient to call me back to facilitate scheduling scan.  

## 2018-07-12 NOTE — Telephone Encounter (Signed)
Received referral for initial lung cancer screening scan. Contacted patient and obtained smoking history,(current, 41.25 pack year) as well as answering questions related to screening process. Patient denies signs of lung cancer such as weight loss or hemoptysis. Patient denies comorbidity that would prevent curative treatment if lung cancer were found. Patient is scheduled for shared decision making visit and CT scan on 08/01/18.

## 2018-07-31 ENCOUNTER — Encounter: Payer: Self-pay | Admitting: Nurse Practitioner

## 2018-08-01 ENCOUNTER — Ambulatory Visit
Admission: RE | Admit: 2018-08-01 | Discharge: 2018-08-01 | Disposition: A | Discharge status: Home / Self Care | Payer: Medicare Other | Source: Ambulatory Visit | Attending: Nurse Practitioner | Admitting: Nurse Practitioner

## 2018-08-01 ENCOUNTER — Inpatient Hospital Stay: Payer: Medicare Other | Attending: Nurse Practitioner | Admitting: Nurse Practitioner

## 2018-08-01 DIAGNOSIS — I700 Atherosclerosis of aorta: Secondary | ICD-10-CM | POA: Insufficient documentation

## 2018-08-01 DIAGNOSIS — Z87891 Personal history of nicotine dependence: Secondary | ICD-10-CM

## 2018-08-01 DIAGNOSIS — Z122 Encounter for screening for malignant neoplasm of respiratory organs: Secondary | ICD-10-CM | POA: Diagnosis present

## 2018-08-01 NOTE — Progress Notes (Signed)
In accordance with CMS guidelines, patient has met eligibility criteria including age, absence of signs or symptoms of lung cancer.  Social History   Tobacco Use  . Smoking status: Current Every Day Smoker    Packs/day: 0.75    Years: 55.00    Pack years: 41.25    Types: Cigarettes  . Smokeless tobacco: Never Used  Substance Use Topics  . Alcohol use: Yes    Alcohol/week: 0.0 standard drinks  . Drug use: No      A shared decision-making session was conducted prior to the performance of CT scan. This includes one or more decision aids, includes benefits and harms of screening, follow-up diagnostic testing, over-diagnosis, false positive rate, and total radiation exposure.   Counseling on the importance of adherence to annual lung cancer LDCT screening, impact of co-morbidities, and ability or willingness to undergo diagnosis and treatment is imperative for compliance of the program.   Counseling on the importance of continued smoking cessation for former smokers; the importance of smoking cessation for current smokers, and information about tobacco cessation interventions have been given to patient including [place] and [employer] quit [place] programs.   Written order for lung cancer screening with LDCT has been given to the patient and any and all questions have been answered to the best of my abilities.    Yearly follow up will be coordinated by Burgess Estelle, Thoracic Navigator.  Beckey Rutter, DNP, AGNP-C [place] [facility] [phone] (work cell) [phone] (office) 08/01/18 3:06 PM

## 2018-08-06 ENCOUNTER — Encounter: Payer: Self-pay | Admitting: *Deleted

## 2018-08-13 ENCOUNTER — Encounter (INDEPENDENT_AMBULATORY_CARE_PROVIDER_SITE_OTHER): Payer: Self-pay | Admitting: Vascular Surgery

## 2018-08-13 ENCOUNTER — Ambulatory Visit (INDEPENDENT_AMBULATORY_CARE_PROVIDER_SITE_OTHER): Payer: Medicare Other | Admitting: Vascular Surgery

## 2018-08-13 VITALS — BP 156/93 | HR 94 | Resp 17 | Ht 65.5 in | Wt 165.8 lb

## 2018-08-13 DIAGNOSIS — I6523 Occlusion and stenosis of bilateral carotid arteries: Secondary | ICD-10-CM | POA: Diagnosis not present

## 2018-08-13 DIAGNOSIS — I10 Essential (primary) hypertension: Secondary | ICD-10-CM

## 2018-08-13 DIAGNOSIS — F17210 Nicotine dependence, cigarettes, uncomplicated: Secondary | ICD-10-CM

## 2018-08-13 DIAGNOSIS — F17200 Nicotine dependence, unspecified, uncomplicated: Secondary | ICD-10-CM | POA: Insufficient documentation

## 2018-08-13 DIAGNOSIS — E785 Hyperlipidemia, unspecified: Secondary | ICD-10-CM | POA: Diagnosis not present

## 2018-08-13 DIAGNOSIS — I6529 Occlusion and stenosis of unspecified carotid artery: Secondary | ICD-10-CM | POA: Insufficient documentation

## 2018-08-13 NOTE — Assessment & Plan Note (Signed)
carotid duplex which showed a right carotid endarterectomy with good flow.  Her left carotid artery velocities were in the 50 to 70% range by their duplex criteria.  Recommend:  Given the patient's asymptomatic subcritical stenosis no further invasive testing or surgery at this time.  Continue antiplatelet therapy as prescribed and Lipitor Continue management of CAD, HTN and Hyperlipidemia Healthy heart diet,  encouraged exercise at least 4 times per week Follow up in 6 months with duplex ultrasound and physical exam

## 2018-08-13 NOTE — Assessment & Plan Note (Signed)
blood pressure control important in reducing the progression of atherosclerotic disease. On appropriate oral medications.  

## 2018-08-13 NOTE — Assessment & Plan Note (Signed)
lipid control important in reducing the progression of atherosclerotic disease. Continue statin therapy  

## 2018-08-13 NOTE — Assessment & Plan Note (Signed)
We had a discussion for approximately 3-4 minutes regarding the absolute need for smoking cessation due to the deleterious nature of tobacco on the vascular system. We discussed the tobacco use would diminish patency of any intervention, and likely significantly worsen progressio of disease. We discussed multiple agents for quitting including replacement therapy or medications to reduce cravings such as Chantix. The patient voices their understanding of the importance of smoking cessation.  

## 2018-08-13 NOTE — Progress Notes (Signed)
Patient ID: Barbara Contreras, female   DOB: 1951/02/28, 67 y.o.   MRN: [id number]  Chief Complaint  Patient presents with  . New Patient (Initial Visit)    ref carotid stenosis    HPI Barbara Contreras is a 67 y.o. female.  I am asked to see the patient by Dr. Nehemiah Massed for evaluation of carotid stenosis.  The patient reports several years ago having a syncopal episode which prompted a carotid work-up.  She underwent a right carotid endarterectomy about 4 years ago and did well from that.  She denies any recent focal neurologic symptoms. Specifically, the patient denies amaurosis fugax, speech or swallowing difficulties, or arm or leg weakness or numbness.  Her cardiologist recently performed a carotid duplex which showed a right carotid endarterectomy with good flow.  Her left carotid artery velocities were in the 50 to 70% range by their duplex criteria.  She was referred for further evaluation and treatment.   Past Medical History:  Diagnosis Date  . Arthritis   . Asthma   . Carotid stenosis   . Depression   . Emphysema of lung ([place])   . Heart murmur   . HLD (hyperlipidemia)   . Hypertension   . IBS (irritable bowel syndrome)     Past Surgical History:  Procedure Laterality Date  . ABDOMINAL HYSTERECTOMY    . CAROTID ENDARTERECTOMY    . CHOLECYSTECTOMY      Family History  Problem Relation Age of Onset  . Alcohol abuse Father   . Alzheimer's disease Father   . Anxiety disorder Father   . Depression Father   . Alcohol abuse Mother   . CAD Mother   . Diabetes Mother   . Hyperlipidemia Mother   . Depression Mother      Social History Social History   Tobacco Use  . Smoking status: Current Every Day Smoker    Packs/day: 0.75    Years: 55.00    Pack years: 41.25    Types: Cigarettes  . Smokeless tobacco: Never Used  Substance Use Topics  . Alcohol use: Yes    Alcohol/week: 0.0 standard drinks  . Drug use: No     Allergies  Allergen Reactions  .  Morphine And Related Itching  . Clindamycin Other (See Comments)  . Penicillins Other (See Comments)    Reaction: unknown Has patient had a PCN reaction causing immediate rash, facial/tongue/throat swelling, SOB or lightheadedness with hypotension: Unknown Has patient had a PCN reaction causing severe rash involving mucus membranes or skin necrosis: Unknown Has patient had a PCN reaction that required hospitalization: Unknown Has patient had a PCN reaction occurring within the last 10 years: no If all of the above answers are "NO", then may proceed with Cephalosporin use. .  . Promethazine-Phenylephrine Other (See Comments)    Muscle spasm  . Tape Other (See Comments)    Redness and itching    Current Outpatient Medications  Medication Sig Dispense Refill  . atorvastatin (LIPITOR) 40 MG tablet Take 40 mg by mouth daily.  11  . fluticasone (FLONASE) 50 MCG/ACT nasal spray Place 2 sprays into both nostrils daily.  2  . gabapentin (NEURONTIN) 600 MG tablet Take 600 mg by mouth 3 (three) times daily.  5  . Multiple Vitamin (MULTIVITAMIN WITH MINERALS) TABS tablet Take 1 tablet by mouth daily.    . naproxen (NAPROSYN) 500 MG tablet Take 500 mg by mouth 2 (two) times daily.    Marland Kitchen triamterene-hydrochlorothiazide (DYAZIDE) 37.5-25  MG capsule Take 1 capsule by mouth every morning.  11  . venlafaxine XR (EFFEXOR-XR) 150 MG 24 hr capsule Take 150 mg by mouth daily with breakfast.     No current facility-administered medications for this visit.       REVIEW OF SYSTEMS (Negative unless checked)  Constitutional: [] Weight loss  [] Fever  [] Chills Cardiac: [] Chest pain   [] Chest pressure   [] Palpitations   [] Shortness of breath when laying flat   [] Shortness of breath at rest   [] Shortness of breath with exertion. Vascular:  [] Pain in legs with walking   [] Pain in legs at rest   [] Pain in legs when laying flat   [] Claudication   [] Pain in feet when walking  [] Pain in feet at rest  [] Pain in feet  when laying flat   [] History of DVT   [] Phlebitis   [] Swelling in legs   [] Varicose veins   [] Non-healing ulcers Pulmonary:   [] Uses home oxygen   [] Productive cough   [] Hemoptysis   [] Wheeze  [x] COPD   [x] Asthma Neurologic:  [] Dizziness  [] Blackouts   [] Seizures   [] History of stroke   [] History of TIA  [] Aphasia   [] Temporary blindness   [] Dysphagia   [] Weakness or numbness in arms   [] Weakness or numbness in legs Musculoskeletal:  [x] Arthritis   [] Joint swelling   [] Joint pain   [] Low back pain Hematologic:  [] Easy bruising  [] Easy bleeding   [] Hypercoagulable state   [] Anemic  [] Hepatitis Gastrointestinal:  [] Blood in stool   [] Vomiting blood  [x] Gastroesophageal reflux/heartburn   [] Abdominal pain Genitourinary:  [] Chronic kidney disease   [] Difficult urination  [] Frequent urination  [] Burning with urination   [] Hematuria Skin:  [] Rashes   [] Ulcers   [] Wounds Psychological:  [] History of anxiety   [x]  History of major depression.    Physical Exam BP (!) 156/93 (BP Location: Right Arm)   Pulse 94   Resp 17   Ht 5' 5.5" (1.664 m)   Wt 165 lb 12.8 oz (75.2 kg)   BMI 27.17 kg/m  Gen:  WD/WN, NAD Head: [place]/AT, No temporalis wasting. Ear/Nose/Throat: Hearing grossly intact, nares w/o erythema or drainage, oropharynx w/o Erythema/Exudate Eyes: Conjunctiva clear, sclera non-icteric  Neck: trachea midline.  Soft left carotid bruit Pulmonary:  Good air movement, clear to auscultation bilaterally.  Cardiac: RRR, normal S1, S2 Vascular:  Vessel Right Left  Radial Palpable Palpable                                    Musculoskeletal: M/S 5/5 throughout.  Extremities without ischemic changes.  No deformity or atrophy.  No significant edema. Neurologic: Sensation grossly intact in extremities.  Symmetrical.  Speech is fluent. Motor exam as listed above. Psychiatric: Judgment intact, Mood & affect appropriate for pt's clinical situation. Dermatologic: No rashes or ulcers noted.  No  cellulitis or open wounds.  Radiology Ct Chest Lung Cancer Screening Low Dose Wo Contrast  Result Date: 08/02/2018 CLINICAL DATA:  67 year old female current smoker, with 41 pack-year history of smoking, for initial lung cancer screening EXAM: CT CHEST WITHOUT CONTRAST LOW-DOSE FOR LUNG CANCER SCREENING TECHNIQUE: Multidetector CT imaging of the chest was performed following the standard protocol without IV contrast. COMPARISON:  None. FINDINGS: Cardiovascular: Heart is normal in size.  No pericardial effusion. No evidence of thoracic aortic aneurysm. Atherosclerotic calcifications of the aortic arch. Mediastinum/Nodes: No suspicious mediastinal lymphadenopathy. Visualized thyroid is unremarkable. Lungs/Pleura: Mild  scarring/atelectasis in the right middle lobe and lingula. No focal consolidation. Benign calcified granulomata bilaterally. No suspicious pulmonary nodules. No pleural effusion or pneumothorax. Upper Abdomen: Visualized upper abdomen is notable for prior cholecystectomy and vascular calcifications. Musculoskeletal: Visualized osseous structures are within normal limits. IMPRESSION: Lung-RADS 1, negative. Continue annual screening with low-dose chest CT without contrast in 12 months. Aortic Atherosclerosis (ICD10-I70.0). Electronically Signed   By: Julian Hy M.D.   On: 08/02/2018 12:54    Labs No results found for this or any previous visit (from the past 2160 hour(s)).  Assessment/Plan:  HTN (hypertension) blood pressure control important in reducing the progression of atherosclerotic disease. On appropriate oral medications.   HLD (hyperlipidemia) lipid control important in reducing the progression of atherosclerotic disease. Continue statin therapy   Carotid stenosis carotid duplex which showed a right carotid endarterectomy with good flow.  Her left carotid artery velocities were in the 50 to 70% range by their duplex criteria.  Recommend:  Given the patient's  asymptomatic subcritical stenosis no further invasive testing or surgery at this time.  Continue antiplatelet therapy as prescribed and Lipitor Continue management of CAD, HTN and Hyperlipidemia Healthy heart diet,  encouraged exercise at least 4 times per week Follow up in 6 months with duplex ultrasound and physical exam   Tobacco use disorder We had a discussion for approximately 3-4 minutes regarding the absolute need for smoking cessation due to the deleterious nature of tobacco on the vascular system. We discussed the tobacco use would diminish patency of any intervention, and likely significantly worsen progressio of disease. We discussed multiple agents for quitting including replacement therapy or medications to reduce cravings such as Chantix. The patient voices their understanding of the importance of smoking cessation.       Leotis Pain 08/13/2018, 3:23 PM   This note was created with Dragon medical transcription system.  Any errors from dictation are unintentional.

## 2018-08-13 NOTE — Patient Instructions (Signed)
Carotid Artery Disease The carotid arteries are arteries on both sides of the neck. They carry blood to the brain. Carotid artery disease is when the arteries get smaller (narrow) or get blocked. If these arteries get smaller or get blocked, you are more likely to have a stroke or warning stroke (transient ischemic attack). Follow these instructions at home:  Take medicines as told by your doctor. Make sure you understand all your medicine instructions. Do not stop your medicines without talking to your doctor first.  Follow your doctor's diet instructions. It is important to eat a healthy diet that includes plenty of: ? Fresh fruits. ? Vegetables. ? Lean meats.  Avoid: ? High-fat foods. ? High-sodium foods. ? Foods that are fried, overly processed, or have poor nutritional value.  Stay a healthy weight.  Stay active. Get at least 30 minutes of activity every day.  Do not smoke.  Limit alcohol use to: ? No more than 2 drinks a day for men. ? No more than 1 drink a day for women who are not pregnant.  Do not use illegal drugs.  Keep all doctor visits as told. Get help right away if:  You have sudden weakness or loss of feeling (numbness) on one side of the body, such as the face, arm, or leg.  You have sudden confusion.  You have trouble speaking (aphasia) or understanding.  You have sudden trouble seeing out of one or both eyes.  You have sudden trouble walking.  You have dizziness or feel like you might pass out (faint).  You have a loss of balance or your movements are not steady (uncoordinated).  You have a sudden, severe headache with no known cause.  You have trouble swallowing (dysphagia). Call your local emergency services (911 in [place] Do notdrive yourself to the clinic or hospital. This information is not intended to replace advice given to you by your health care provider. Make sure you discuss any questions you have with your health care  provider. Document Released: 11/06/2012 Document Revised: 04/27/2016 Document Reviewed: 05/21/2013 Elsevier Interactive Patient Education  2018 Elsevier Inc.  

## 2018-09-12 ENCOUNTER — Encounter: Payer: Self-pay | Admitting: *Deleted

## 2018-09-13 ENCOUNTER — Ambulatory Visit: Payer: Medicare Other | Admitting: Anesthesiology

## 2018-09-13 ENCOUNTER — Encounter: Payer: Self-pay | Admitting: Anesthesiology

## 2018-09-13 ENCOUNTER — Encounter
Admission: RE | Disposition: A | Discharge status: Home / Self Care | Payer: Self-pay | Source: Ambulatory Visit | Attending: Gastroenterology

## 2018-09-13 ENCOUNTER — Ambulatory Visit
Admission: RE | Admit: 2018-09-13 | Discharge: 2018-09-13 | Disposition: A | Discharge status: Home / Self Care | Payer: Medicare Other | Source: Ambulatory Visit | Attending: Gastroenterology | Admitting: Gastroenterology

## 2018-09-13 DIAGNOSIS — K5730 Diverticulosis of large intestine without perforation or abscess without bleeding: Secondary | ICD-10-CM | POA: Insufficient documentation

## 2018-09-13 DIAGNOSIS — E785 Hyperlipidemia, unspecified: Secondary | ICD-10-CM | POA: Diagnosis not present

## 2018-09-13 DIAGNOSIS — F17200 Nicotine dependence, unspecified, uncomplicated: Secondary | ICD-10-CM | POA: Insufficient documentation

## 2018-09-13 DIAGNOSIS — I10 Essential (primary) hypertension: Secondary | ICD-10-CM | POA: Insufficient documentation

## 2018-09-13 DIAGNOSIS — I739 Peripheral vascular disease, unspecified: Secondary | ICD-10-CM | POA: Insufficient documentation

## 2018-09-13 DIAGNOSIS — Z791 Long term (current) use of non-steroidal anti-inflammatories (NSAID): Secondary | ICD-10-CM | POA: Diagnosis not present

## 2018-09-13 DIAGNOSIS — Z7951 Long term (current) use of inhaled steroids: Secondary | ICD-10-CM | POA: Insufficient documentation

## 2018-09-13 DIAGNOSIS — J439 Emphysema, unspecified: Secondary | ICD-10-CM | POA: Insufficient documentation

## 2018-09-13 DIAGNOSIS — F329 Major depressive disorder, single episode, unspecified: Secondary | ICD-10-CM | POA: Insufficient documentation

## 2018-09-13 DIAGNOSIS — Z79899 Other long term (current) drug therapy: Secondary | ICD-10-CM | POA: Insufficient documentation

## 2018-09-13 DIAGNOSIS — Z7982 Long term (current) use of aspirin: Secondary | ICD-10-CM | POA: Insufficient documentation

## 2018-09-13 DIAGNOSIS — Z1211 Encounter for screening for malignant neoplasm of colon: Secondary | ICD-10-CM | POA: Diagnosis present

## 2018-09-13 DIAGNOSIS — Z86010 Personal history of colonic polyps: Secondary | ICD-10-CM | POA: Insufficient documentation

## 2018-09-13 HISTORY — DX: Personal history of other infectious and parasitic diseases: Z86.19

## 2018-09-13 HISTORY — PX: COLONOSCOPY WITH PROPOFOL: SHX5780

## 2018-09-13 HISTORY — DX: Sciatica, unspecified side: M54.30

## 2018-09-13 HISTORY — DX: Vitamin D deficiency, unspecified: E55.9

## 2018-09-13 LAB — URINE DRUG SCREEN, QUALITATIVE (ARMC ONLY)
AMPHETAMINES, UR SCREEN: NOT DETECTED
Barbiturates, Ur Screen: NOT DETECTED
Benzodiazepine, Ur Scrn: NOT DETECTED
COCAINE METABOLITE, UR ~~LOC~~: NOT DETECTED
Cannabinoid 50 Ng, Ur ~~LOC~~: NOT DETECTED
MDMA (ECSTASY) UR SCREEN: NOT DETECTED
METHADONE SCREEN, URINE: NOT DETECTED
Opiate, Ur Screen: NOT DETECTED
Phencyclidine (PCP) Ur S: NOT DETECTED
Tricyclic, Ur Screen: NOT DETECTED

## 2018-09-13 SURGERY — COLONOSCOPY WITH PROPOFOL
Anesthesia: General

## 2018-09-13 MED ORDER — PROPOFOL 500 MG/50ML IV EMUL
INTRAVENOUS | Status: DC | PRN
  Administered 2018-09-13: 120 ug/kg/min via INTRAVENOUS

## 2018-09-13 MED ORDER — FENTANYL CITRATE (PF) 100 MCG/2ML IJ SOLN
INTRAMUSCULAR | Status: AC
  Filled 2018-09-13: qty 2

## 2018-09-13 MED ORDER — PROPOFOL 500 MG/50ML IV EMUL
INTRAVENOUS | Status: AC
  Filled 2018-09-13: qty 50

## 2018-09-13 MED ORDER — FENTANYL CITRATE (PF) 100 MCG/2ML IJ SOLN
INTRAMUSCULAR | Status: DC | PRN
  Administered 2018-09-13: 50 ug via INTRAVENOUS
  Administered 2018-09-13 (×2): 25 ug via INTRAVENOUS

## 2018-09-13 MED ORDER — MIDAZOLAM HCL 2 MG/2ML IJ SOLN
INTRAMUSCULAR | Status: AC
  Filled 2018-09-13: qty 2

## 2018-09-13 MED ORDER — MIDAZOLAM HCL 2 MG/2ML IJ SOLN
INTRAMUSCULAR | Status: DC | PRN
  Administered 2018-09-13: 2 mg via INTRAVENOUS

## 2018-09-13 MED ORDER — LIDOCAINE HCL (PF) 1 % IJ SOLN
INTRAMUSCULAR | Status: AC
  Administered 2018-09-13: 0.3 mL
  Filled 2018-09-13: qty 2

## 2018-09-13 MED ORDER — SODIUM CHLORIDE 0.9 % IV SOLN
INTRAVENOUS | Status: DC
  Administered 2018-09-13: 10:00:00 via INTRAVENOUS

## 2018-09-13 MED ORDER — SODIUM CHLORIDE 0.9 % IV SOLN
INTRAVENOUS | Status: DC
  Administered 2018-09-13: 1000 mL via INTRAVENOUS

## 2018-09-13 NOTE — Op Note (Signed)
[facility] Gastroenterology Patient Name: Barbara Contreras Procedure Date: 09/13/2018 9:39 AM MRN: [id number] Account #: [id number] Date of Birth: Oct 23, 1951 Admit Type: Outpatient Age: 67 Room: [facility] ENDO ROOM 3 Gender: Female Note Status: Finalized Procedure:            Colonoscopy Indications:          Personal history of colonic polyps Providers:            Lollie Sails, MD Referring MD:         Valera Castle (Referring MD) Medicines:            Monitored Anesthesia Care Complications:        No immediate complications. Procedure:            Pre-Anesthesia Assessment:                       - ASA Grade Assessment: III - A patient with severe                        systemic disease.                       After obtaining informed consent, the colonoscope was                        passed under direct vision. Throughout the procedure,                        the patient's blood pressure, pulse, and oxygen                        saturations were monitored continuously. The                        Colonoscope was introduced through the anus and                        advanced to the the cecum, identified by appendiceal                        orifice and ileocecal valve. The colonoscopy was                        performed without difficulty. The patient tolerated the                        procedure well. The quality of the bowel preparation                        was good. Findings:      Many small and large-mouthed diverticula were found in the sigmoid       colon, descending colon, transverse colon and ascending colon.      The retroflexed view of the distal rectum and anal verge was normal and       showed no anal or rectal abnormalities.      The digital rectal exam was normal. Impression:           - Diverticulosis in the sigmoid colon, in the  descending colon, in the transverse colon and in the   ascending colon.                       - The distal rectum and anal verge are normal on                        retroflexion view.                       - No specimens collected. Recommendation:       - Repeat colonoscopy in 5 years for screening purposes. Procedure Code(s):    --- Professional ---                       [phone], Colonoscopy, flexible; diagnostic, including                        collection of specimen(s) by brushing or washing, when                        performed (separate procedure) Diagnosis Code(s):    --- Professional ---                       Z86.010, Personal history of colonic polyps                       K57.30, Diverticulosis of large intestine without                        perforation or abscess without bleeding CPT copyright 2018 American Medical Association. All rights reserved. The codes documented in this report are preliminary and upon coder review may  be revised to meet current compliance requirements. Lollie Sails, MD 09/13/2018 10:04:38 AM This report has been signed electronically. Number of Addenda: 0 Note Initiated On: 09/13/2018 9:39 AM Scope Withdrawal Time: 0 hours 6 minutes 52 seconds  Total Procedure Duration: 0 hours 18 minutes 18 seconds       [facility]

## 2018-09-13 NOTE — Anesthesia Preprocedure Evaluation (Signed)
Anesthesia Evaluation  Patient identified by MRN, date of birth, ID band Patient awake    Reviewed: Allergy & Precautions, H&P , NPO status , Patient's Chart, lab work & pertinent test results, reviewed documented beta blocker date and time   History of Anesthesia Complications Negative for: history of anesthetic complications  Airway Mallampati: III  TM Distance: >3 FB Neck ROM: full    Dental no notable dental hx. (+) Dental Advidsory Given, Caps, Missing, Poor Dentition   Pulmonary shortness of breath and with exertion, asthma , neg sleep apnea, COPD,  COPD inhaler, neg recent URI, Current Smoker,           Cardiovascular Exercise Tolerance: Good hypertension, (-) angina+ Peripheral Vascular Disease (Carotid stenosis)  (-) CAD, (-) Past MI, (-) Cardiac Stents and (-) CABG (-) dysrhythmias + Valvular Problems/Murmurs      Neuro/Psych PSYCHIATRIC DISORDERS Depression negative neurological ROS     GI/Hepatic negative GI ROS, Neg liver ROS,   Endo/Other  negative endocrine ROS  Renal/GU negative Renal ROS  negative genitourinary   Musculoskeletal   Abdominal   Peds  Hematology negative hematology ROS (+)   Anesthesia Other Findings Past Medical History: No date: Arthritis No date: Asthma No date: Carotid stenosis No date: Depression No date: Emphysema of lung ([facility]) No date: Heart murmur No date: History of Lyme disease No date: HLD (hyperlipidemia) No date: Hypertension No date: IBS (irritable bowel syndrome) No date: Sciatica No date: Vitamin D deficiency   Reproductive/Obstetrics negative OB ROS                             Anesthesia Physical Anesthesia Plan  ASA: III  Anesthesia Plan: General   Post-op Pain Management:    Induction: Intravenous  PONV Risk Score and Plan: 2 and Propofol infusion and TIVA  Airway Management Planned: Nasal Cannula and Natural  Airway  Additional Equipment:   Intra-op Plan:   Post-operative Plan:   Informed Consent: I have reviewed the patients History and Physical, chart, labs and discussed the procedure including the risks, benefits and alternatives for the proposed anesthesia with the patient or authorized representative who has indicated his/her understanding and acceptance.   Dental Advisory Given  Plan Discussed with: Anesthesiologist, CRNA and Surgeon  Anesthesia Plan Comments:         Anesthesia Quick Evaluation

## 2018-09-13 NOTE — Anesthesia Post-op Follow-up Note (Signed)
Anesthesia QCDR form completed.        

## 2018-09-13 NOTE — Anesthesia Postprocedure Evaluation (Signed)
Anesthesia Post Note  Patient: Barbara Contreras  Procedure(s) Performed: COLONOSCOPY WITH PROPOFOL (N/A )  Patient location during evaluation: Endoscopy Anesthesia Type: General Level of consciousness: awake and alert Pain management: pain level controlled Vital Signs Assessment: post-procedure vital signs reviewed and stable Respiratory status: spontaneous breathing, nonlabored ventilation, respiratory function stable and patient connected to nasal cannula oxygen Cardiovascular status: blood pressure returned to baseline and stable Postop Assessment: no apparent nausea or vomiting Anesthetic complications: no     Last Vitals:  Vitals:   09/13/18 1041 09/13/18 1051  BP: 104/60 (!) 154/90  Pulse: 62 62  Resp: 15 17  Temp:    SpO2: 95% 97%    Last Pain:  Vitals:   09/13/18 1051  TempSrc:   PainSc: 0-No pain                 Martha Clan

## 2018-09-13 NOTE — Transfer of Care (Signed)
Immediate Anesthesia Transfer of Care Note  Patient: Barbara Contreras  Procedure(s) Performed: COLONOSCOPY WITH PROPOFOL (N/A )  Patient Location: PACU  Anesthesia Type:General  Level of Consciousness: awake and sedated  Airway & Oxygen Therapy: Patient Spontanous Breathing and Patient connected to face mask oxygen  Post-op Assessment: Report given to RN and Post -op Vital signs reviewed and stable  Post vital signs: Reviewed and stable  Last Vitals:  Vitals Value Taken Time  BP    Temp    Pulse    Resp    SpO2      Last Pain:  Vitals:   09/13/18 0813  TempSrc: Tympanic         Complications: No apparent anesthesia complications

## 2018-09-13 NOTE — H&P (Signed)
Outpatient short stay form Pre-procedure 09/13/2018 8:35 AM Lollie Sails MD  Primary Physician: Dr.Mario Kym Groom  Reason for visit: Colonoscopy  History of present illness: Patient is a 67 year old female presenting today as above.  She has personal history of adenomatous colon polyps with her last colonoscopy in 2014.  She tolerated prep well although she did eat some solid food yesterday.  Rectal exam was done in preop which indicated no formed stool in the vault.  Discussed that if I do run into stool further up the colon we may need to abort the procedure and she understands this.  She takes a 81 mg aspirin that is been held for several days.  She takes no other aspirin products or blood thinning agent.    Current Facility-Administered Medications:  .  0.9 %  sodium chloride infusion, , Intravenous, Continuous, Lollie Sails, MD .  0.9 %  sodium chloride infusion, , Intravenous, Continuous, Lollie Sails, MD  Medications Prior to Admission  Medication Sig Dispense Refill Last Dose  . aspirin EC 81 MG tablet Take 81 mg by mouth daily.   09/10/2018  . ipratropium (ATROVENT) 0.06 % nasal spray Place 2 sprays into both nostrils 3 (three) times daily.     Marland Kitchen lisinopril-hydrochlorothiazide (PRINZIDE,ZESTORETIC) 20-12.5 MG tablet Take 1 tablet by mouth daily.     . montelukast (SINGULAIR) 10 MG tablet Take 10 mg by mouth at bedtime.     Marland Kitchen tiotropium (SPIRIVA) 18 MCG inhalation capsule Place 18 mcg into inhaler and inhale daily.     Marland Kitchen atorvastatin (LIPITOR) 40 MG tablet Take 40 mg by mouth daily.  11 Taking  . fluticasone (FLONASE) 50 MCG/ACT nasal spray Place 2 sprays into both nostrils daily.  2 Taking  . gabapentin (NEURONTIN) 600 MG tablet Take 600 mg by mouth 3 (three) times daily.  5 Taking  . Multiple Vitamin (MULTIVITAMIN WITH MINERALS) TABS tablet Take 1 tablet by mouth daily.   Taking  . naproxen (NAPROSYN) 500 MG tablet Take 500 mg by mouth 2 (two) times daily.   Taking   . triamterene-hydrochlorothiazide (DYAZIDE) 37.5-25 MG capsule Take 1 capsule by mouth every morning.  11 Taking  . venlafaxine XR (EFFEXOR-XR) 150 MG 24 hr capsule Take 150 mg by mouth daily with breakfast.   Taking     Allergies  Allergen Reactions  . Morphine And Related Itching  . Clindamycin Other (See Comments)  . Penicillins Other (See Comments)    Reaction: unknown Has patient had a PCN reaction causing immediate rash, facial/tongue/throat swelling, SOB or lightheadedness with hypotension: Unknown Has patient had a PCN reaction causing severe rash involving mucus membranes or skin necrosis: Unknown Has patient had a PCN reaction that required hospitalization: Unknown Has patient had a PCN reaction occurring within the last 10 years: no If all of the above answers are "NO", then may proceed with Cephalosporin use. .  . Promethazine-Phenylephrine Other (See Comments)    Muscle spasm  . Tape Other (See Comments)    Redness and itching     Past Medical History:  Diagnosis Date  . Arthritis   . Asthma   . Carotid stenosis   . Depression   . Emphysema of lung ([place])   . Heart murmur   . History of Lyme disease   . HLD (hyperlipidemia)   . Hypertension   . IBS (irritable bowel syndrome)   . Sciatica   . Vitamin D deficiency     Review of systems:  Physical Exam    Heart and lungs: Regular rate and rhythm without rub or gallop, lungs are bilaterally clear.    HEENT: Normocephalic atraumatic eyes are anicteric    Other:    Pertinant exam for procedure: Soft nontender nondistended bowel sounds positive normoactive.    Planned proceedures: Colonoscopy and indicated procedures. I have discussed the risks benefits and complications of procedures to include not limited to bleeding, infection, perforation and the risk of sedation and the patient wishes to proceed.    Lollie Sails, MD Gastroenterology 09/13/2018  8:35 AM

## 2019-02-11 ENCOUNTER — Encounter (INDEPENDENT_AMBULATORY_CARE_PROVIDER_SITE_OTHER): Payer: Medicare Other

## 2019-02-11 ENCOUNTER — Ambulatory Visit (INDEPENDENT_AMBULATORY_CARE_PROVIDER_SITE_OTHER): Payer: Medicare Other | Admitting: Vascular Surgery

## 2019-03-24 ENCOUNTER — Other Ambulatory Visit: Payer: Self-pay | Admitting: Family Medicine

## 2019-03-24 DIAGNOSIS — Z1231 Encounter for screening mammogram for malignant neoplasm of breast: Secondary | ICD-10-CM

## 2019-05-15 ENCOUNTER — Ambulatory Visit
Admission: RE | Admit: 2019-05-15 | Discharge: 2019-05-15 | Disposition: A | Discharge status: Home / Self Care | Payer: Medicare Other | Source: Ambulatory Visit | Attending: Family Medicine | Admitting: Family Medicine

## 2019-05-15 ENCOUNTER — Other Ambulatory Visit: Payer: Self-pay

## 2019-05-15 DIAGNOSIS — Z1231 Encounter for screening mammogram for malignant neoplasm of breast: Secondary | ICD-10-CM | POA: Diagnosis not present

## 2019-07-01 ENCOUNTER — Other Ambulatory Visit: Payer: Self-pay | Admitting: Physical Medicine and Rehabilitation

## 2019-07-01 DIAGNOSIS — M5416 Radiculopathy, lumbar region: Secondary | ICD-10-CM

## 2019-08-06 ENCOUNTER — Telehealth: Payer: Self-pay | Admitting: *Deleted

## 2019-08-06 NOTE — Telephone Encounter (Signed)
Left message for patient to notify them that it is time to schedule annual low dose lung cancer screening CT scan. Instructed patient to call back to verify information prior to the scan being scheduled.  

## 2019-08-07 ENCOUNTER — Other Ambulatory Visit: Payer: Self-pay

## 2019-08-07 ENCOUNTER — Ambulatory Visit
Admission: RE | Admit: 2019-08-07 | Discharge: 2019-08-07 | Disposition: A | Discharge status: Home / Self Care | Payer: Medicare Other | Source: Ambulatory Visit | Attending: Physical Medicine and Rehabilitation | Admitting: Physical Medicine and Rehabilitation

## 2019-08-07 DIAGNOSIS — M5416 Radiculopathy, lumbar region: Secondary | ICD-10-CM

## 2019-11-11 ENCOUNTER — Encounter: Payer: Self-pay | Admitting: *Deleted

## 2019-12-17 ENCOUNTER — Other Ambulatory Visit: Payer: Self-pay | Admitting: Neurology

## 2019-12-17 DIAGNOSIS — G250 Essential tremor: Secondary | ICD-10-CM

## 2019-12-29 ENCOUNTER — Other Ambulatory Visit: Payer: Self-pay

## 2019-12-29 ENCOUNTER — Ambulatory Visit
Admission: RE | Admit: 2019-12-29 | Discharge: 2019-12-29 | Disposition: A | Discharge status: Home / Self Care | Payer: Medicare Other | Source: Ambulatory Visit | Attending: Neurology | Admitting: Neurology

## 2019-12-29 DIAGNOSIS — G250 Essential tremor: Secondary | ICD-10-CM | POA: Diagnosis not present

## 2020-02-13 ENCOUNTER — Ambulatory Visit: Payer: Medicare Other | Attending: Internal Medicine

## 2020-02-13 DIAGNOSIS — Z23 Encounter for immunization: Secondary | ICD-10-CM

## 2020-02-13 NOTE — Progress Notes (Signed)
   Covid-19 Vaccination Clinic  Name:  Barbara Contreras    MRN: [id number] DOB: 04/01/1951  02/13/2020  [name] was observed post Covid-19 immunization for 15 minutes without incident. She was provided with Vaccine Information Sheet and instruction to access the V-Safe system.   [name] was instructed to call 911 with any severe reactions post vaccine: Marland Kitchen Difficulty breathing  . Swelling of face and throat  . A fast heartbeat  . A bad rash all over body  . Dizziness and weakness   Immunizations Administered    Name Date Dose VIS Date Route   Pfizer COVID-19 Vaccine 02/13/2020  9:14 AM 0.3 mL 11/14/2019 Intramuscular   Manufacturer: [place]   Lot: [id number]   [place]: [id number]

## 2020-03-09 ENCOUNTER — Other Ambulatory Visit: Payer: Self-pay

## 2020-03-09 ENCOUNTER — Ambulatory Visit: Payer: Medicare Other | Attending: Internal Medicine

## 2020-03-09 DIAGNOSIS — Z23 Encounter for immunization: Secondary | ICD-10-CM

## 2020-03-09 NOTE — Progress Notes (Signed)
   Covid-19 Vaccination Clinic  Name:  Barbara Contreras    MRN: [id number] DOB: 1951-06-11  03/09/2020  Barbara Contreras was observed post Covid-19 immunization for 15 minutes without incident. She was provided with Vaccine Information Sheet and instruction to access the V-Safe system.   Barbara Contreras was instructed to call 911 with any severe reactions post vaccine: Marland Kitchen Difficulty breathing  . Swelling of face and throat  . A fast heartbeat  . A bad rash all over body  . Dizziness and weakness   Immunizations Administered    Name Date Dose VIS Date Route   Pfizer COVID-19 Vaccine 03/09/2020 12:41 PM 0.3 mL 11/14/2019 Intramuscular   Manufacturer: [place]   Lot: [id number]   [place]: [id number]

## 2020-05-26 ENCOUNTER — Telehealth: Payer: Self-pay | Admitting: *Deleted

## 2020-05-26 DIAGNOSIS — Z87891 Personal history of nicotine dependence: Secondary | ICD-10-CM

## 2020-05-26 DIAGNOSIS — Z122 Encounter for screening for malignant neoplasm of respiratory organs: Secondary | ICD-10-CM

## 2020-05-26 NOTE — Telephone Encounter (Signed)
Patient has been notified that annual lung cancer screening low dose CT scan is due currently or will be in near future. Confirmed that patient is within the age range of 55-77, and asymptomatic, (no signs or symptoms of lung cancer). Patient denies illness that would prevent curative treatment for lung cancer if found. Verified smoking history, (current, 42.25 pack year). The shared decision making visit was done 08/01/18. Patient is agreeable for CT scan being scheduled.

## 2020-05-27 ENCOUNTER — Ambulatory Visit
Admission: EM | Admit: 2020-05-27 | Discharge: 2020-05-27 | Disposition: A | Discharge status: Home / Self Care | Payer: Medicare Other | Attending: Family Medicine | Admitting: Family Medicine

## 2020-05-27 ENCOUNTER — Other Ambulatory Visit: Payer: Self-pay

## 2020-05-27 ENCOUNTER — Ambulatory Visit (INDEPENDENT_AMBULATORY_CARE_PROVIDER_SITE_OTHER): Payer: Medicare Other

## 2020-05-27 ENCOUNTER — Encounter: Payer: Self-pay | Admitting: Emergency Medicine

## 2020-05-27 DIAGNOSIS — S5012XA Contusion of left forearm, initial encounter: Secondary | ICD-10-CM | POA: Diagnosis not present

## 2020-05-27 NOTE — Discharge Instructions (Signed)
Rest, ice, elevation.  Tylenol and/or aleve as needed.  Take care  Dr. Lacinda Axon

## 2020-05-27 NOTE — ED Provider Notes (Signed)
[facility]    CSN: [id number] Arrival date & time: 05/27/20  1413      History   Chief Complaint Chief Complaint  Patient presents with   Arm Injury   HPI  69 year old female presents with the above complaint.  Patient states that she tripped over a rug last night and fell and hit her left arm on a dresser.  She has a focal area of swelling on the forearm.  Mild pain.  3/10 in severity.  Patient is concerned that she may have a fracture.  She believes she needs an x-ray today.  No other associated symptoms.  No other complaints at this time.  Past Medical History:  Diagnosis Date   Arthritis    Asthma    Carotid stenosis    Depression    Emphysema of lung (HCC)    Heart murmur    History of Lyme disease    HLD (hyperlipidemia)    Hypertension    IBS (irritable bowel syndrome)    Sciatica    Vitamin D deficiency     Patient Active Problem List   Diagnosis Date Noted   Carotid stenosis 08/13/2018   Tobacco use disorder 08/13/2018   Elevated troponin 06/16/2017   Syncope 06/16/2017   Syncope and collapse 03/09/2016   HTN (hypertension) 03/09/2016   HLD (hyperlipidemia) 03/09/2016   Depression 03/09/2016   Heart murmur 03/09/2016    Past Surgical History:  Procedure Laterality Date   ABDOMINAL HYSTERECTOMY     CAROTID ENDARTERECTOMY     CHOLECYSTECTOMY     COLONOSCOPY WITH PROPOFOL N/A 09/13/2018   Procedure: COLONOSCOPY WITH PROPOFOL;  Surgeon: Lollie Sails, MD;  Location: [facility] ENDOSCOPY;  Service: Endoscopy;  Laterality: N/A;   paranasal sinusotomy      OB History   No obstetric history on file.      Home Medications    Prior to Admission medications   Medication Sig Start Date End Date Taking? Authorizing Provider  aspirin EC 81 MG tablet Take 81 mg by mouth daily.   Yes [provider]  atorvastatin (LIPITOR) 40 MG tablet Take 40 mg by mouth daily. 02/17/16  Yes [provider]    fluticasone (FLONASE) 50 MCG/ACT nasal spray Place 2 sprays into both nostrils daily. 02/15/16  Yes [provider]  gabapentin (NEURONTIN) 600 MG tablet Take 600 mg by mouth 3 (three) times daily. 01/24/16  Yes [provider]  ipratropium (ATROVENT) 0.06 % nasal spray Place 2 sprays into both nostrils 3 (three) times daily.   Yes [provider]  lisinopril-hydrochlorothiazide (PRINZIDE,ZESTORETIC) 20-12.5 MG tablet Take 1 tablet by mouth daily.   Yes [provider]  montelukast (SINGULAIR) 10 MG tablet Take 10 mg by mouth at bedtime.   Yes [provider]  Multiple Vitamin (MULTIVITAMIN WITH MINERALS) TABS tablet Take 1 tablet by mouth daily.   Yes [provider]  naproxen (NAPROSYN) 500 MG tablet Take 500 mg by mouth 2 (two) times daily. 05/22/17  Yes [provider]  tiotropium (SPIRIVA) 18 MCG inhalation capsule Place 18 mcg into inhaler and inhale daily.   Yes [provider]  triamterene-hydrochlorothiazide (DYAZIDE) 37.5-25 MG capsule Take 1 capsule by mouth every morning. 06/03/17  Yes [provider]  venlafaxine XR (EFFEXOR-XR) 150 MG 24 hr capsule Take 150 mg by mouth daily with breakfast.   Yes [provider]    Family History Family History  Problem Relation Age of Onset   Alcohol abuse  Father    Alzheimer's disease Father    Anxiety disorder Father    Depression Father    Alcohol abuse Mother    CAD Mother    Diabetes Mother    Hyperlipidemia Mother    Depression Mother     Social History Social History   Tobacco Use   Smoking status: Current Every Day Smoker    Packs/day: 1.00    Years: 55.00    Pack years: 55.00    Types: Cigarettes   Smokeless tobacco: Never Used  Scientific laboratory technician Use: Never used  Substance Use Topics   Alcohol use: Yes    Alcohol/week: 0.0 standard drinks    Comment: occasional   Drug use: Yes    Types: Marijuana     Allergies    Morphine and related, Clindamycin, Penicillins, Promethazine-phenylephrine, and Tape   Review of Systems Review of Systems  Musculoskeletal:       Left arm injury.    Physical Exam Triage Vital Signs ED Triage Vitals  Enc Vitals Group     BP 05/27/20 1448 129/78     Pulse Rate 05/27/20 1448 78     Resp 05/27/20 1448 18     Temp 05/27/20 1448 98.6 F (37 C)     Temp Source 05/27/20 1448 Oral     SpO2 05/27/20 1448 96 %     Weight 05/27/20 1446 175 lb (79.4 kg)     Height 05/27/20 1446 5\' 5"  (1.651 m)     Head Circumference --      Peak Flow --      Pain Score 05/27/20 1446 3     Pain Loc --      Pain Edu? --      Excl. in [place]? --    Updated Vital Signs BP 129/78 (BP Location: Right Arm)    Pulse 78    Temp 98.6 F (37 C) (Oral)    Resp 18    Ht 5\' 5"  (1.651 m)    Wt 79.4 kg    SpO2 96%    BMI 29.12 kg/m   Visual Acuity Right Eye Distance:   Left Eye Distance:   Bilateral Distance:    Right Eye Near:   Left Eye Near:    Bilateral Near:     Physical Exam Vitals and nursing note reviewed.  Constitutional:      General: She is not in acute distress.    Appearance: Normal appearance. She is not ill-appearing.  HENT:     Head: Normocephalic and atraumatic.  Eyes:     General:        Right eye: No discharge.        Left eye: No discharge.     Conjunctiva/sclera: Conjunctivae normal.  Pulmonary:     Effort: Pulmonary effort is normal. No respiratory distress.  Musculoskeletal:     Comments: Left arm -no discrete tenderness over the elbow, wrist, hand.  There is a focal area of swelling consistent with a hematoma over the ulnar aspect of the forearm.  Neurological:     Mental Status: She is alert.  Psychiatric:        Mood and Affect: Mood normal.        Behavior: Behavior normal.    UC Treatments / Results  Labs (all labs ordered are listed, but only abnormal results are displayed) Labs Reviewed - No data to display  EKG   Radiology DG Forearm  Left  Result Date: 05/27/2020  CLINICAL DATA:  Left arm pain and swelling after fall EXAM: LEFT FOREARM - 2 VIEW COMPARISON:  None. FINDINGS: There is no evidence of fracture or other focal bone lesions. Alignment at the wrist and elbow remain anatomic without dislocation. Prominent soft tissue swelling overlies the mid ulnar diaphysis. IMPRESSION: Soft tissue swelling overlying the mid ulnar diaphysis. No acute fracture or dislocation. Electronically Signed   By: Davina Poke D.O.   On: 05/27/2020 15:17    Procedures Procedures (including critical care time)  Medications Ordered in UC Medications - No data to display  Initial Impression / Assessment and Plan / UC Course  I have reviewed the triage vital signs and the nursing notes.  Pertinent labs & imaging results that were available during my care of the patient were reviewed by me and considered in my medical decision making (see chart for details).    69 year old female presents with a traumatic hematoma to the left forearm.  X-ray obtained today and was negative.  X-ray independently reviewed.  No acute findings.  There is soft tissue swelling.  Advise rest, ice, elevation.  Tylenol and/or NSAIDs briefly as needed for pain.  Supportive care.  Final Clinical Impressions(s) / UC Diagnoses   Final diagnoses:  Traumatic hematoma of left forearm, initial encounter     Discharge Instructions     Rest, ice, elevation.  Tylenol and/or aleve as needed.  Take care  Dr. Lacinda Axon    ED Prescriptions    None     PDMP not reviewed this encounter.   Coral Spikes, [place] 05/27/20 1702

## 2020-05-27 NOTE — ED Triage Notes (Signed)
Patient states she fell last night injuring her left arm. She has bruising and swelling to her arm.

## 2020-06-08 ENCOUNTER — Ambulatory Visit: Admission: RE | Admit: 2020-06-08 | Payer: Medicare Other | Source: Ambulatory Visit

## 2020-07-04 IMAGING — CT CT CHEST LUNG CANCER SCREENING LOW DOSE W/O CM
2 of 5 series · 15 of 40 positions shown, 18 images · non-contrast
Comparison: None.

CLINICAL DATA: 67-year-old female current smoker, with 41 pack-year
history of smoking, for initial lung cancer screening

EXAM:
CT CHEST WITHOUT CONTRAST LOW-DOSE FOR LUNG CANCER SCREENING
TECHNIQUE: Multidetector CT imaging of the chest was performed following the
standard protocol without IV contrast.

[Series 3: lung · axial · 0.67mm/px · z∈[-1276,-998]mm · 12 of 308 slices shown, 15 images (1 of 2)]
[im 15/308  mediastinal]
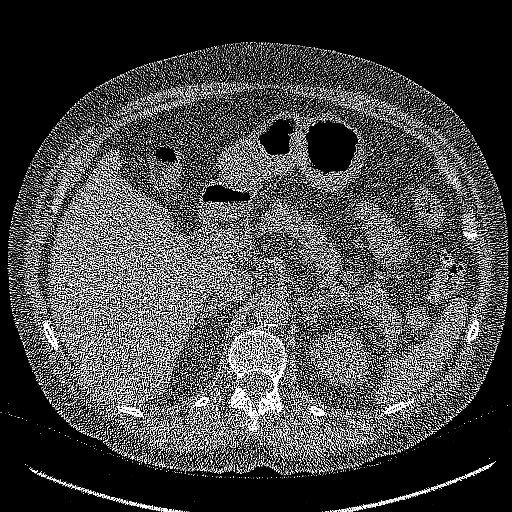
[im 15/308  lung]
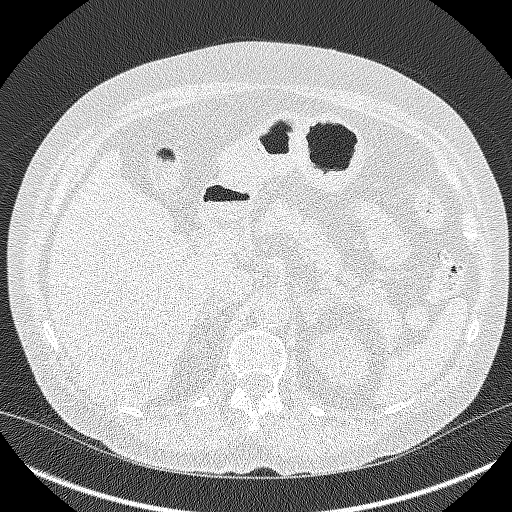
[im 44/308  lung]
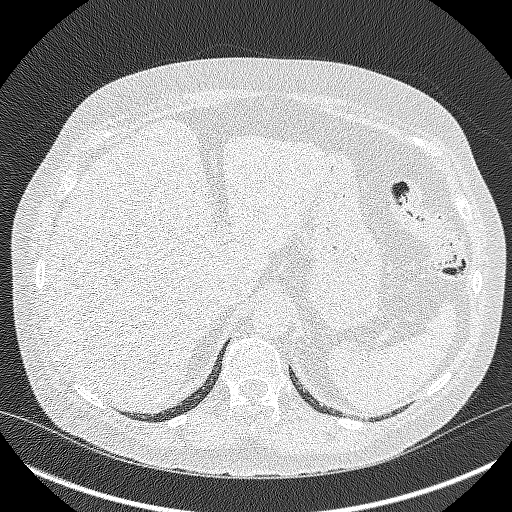
[im 74/308  lung]
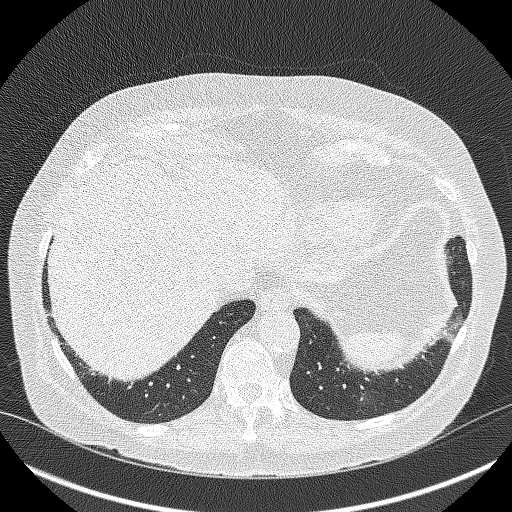
[im 88/308  lung]
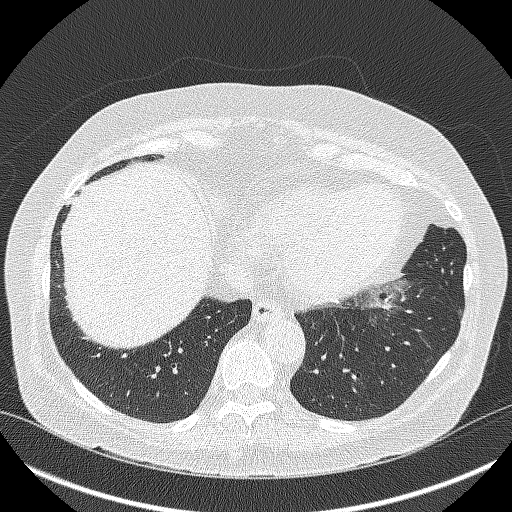
[im 117/308  mediastinal]
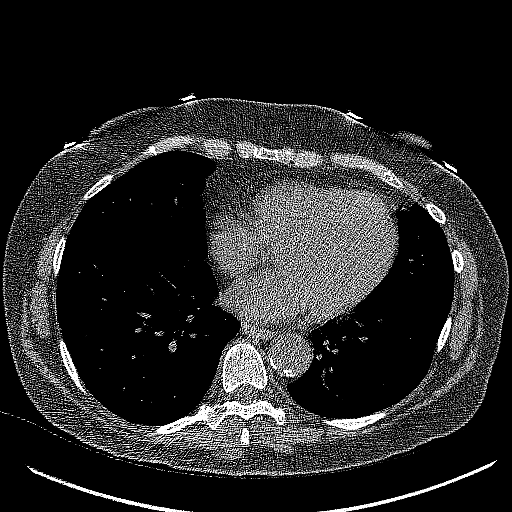
[im 117/308  lung]
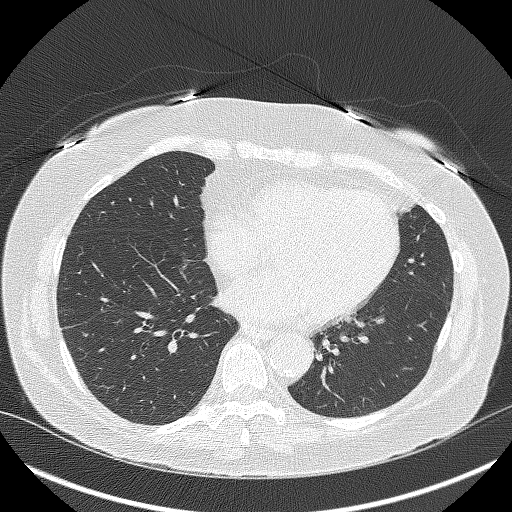
[im 147/308  lung]
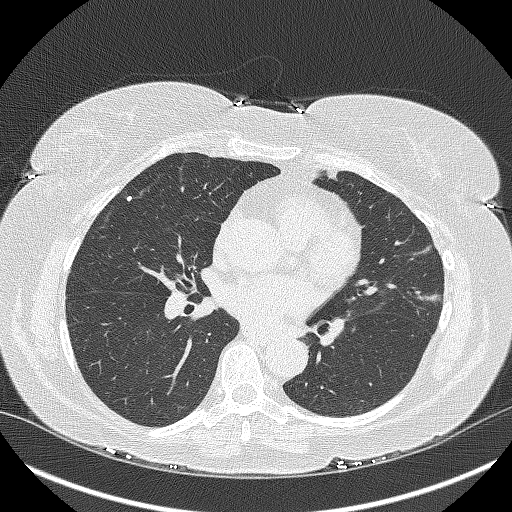
[im 161/308  lung]
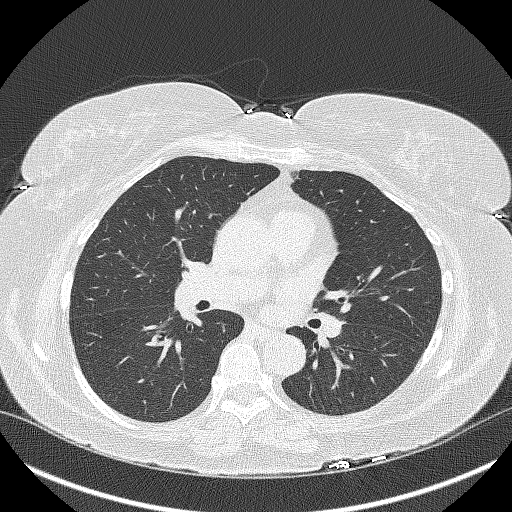
[im 191/308  lung]
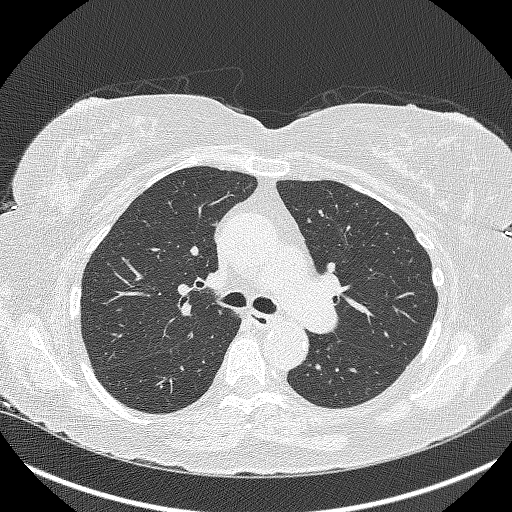
[im 220/308  mediastinal]
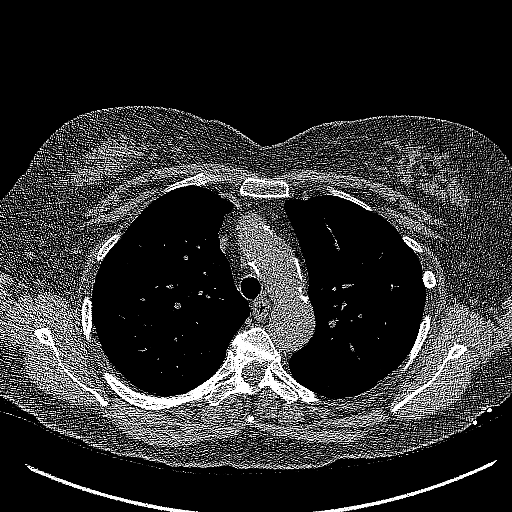
[im 220/308  lung]
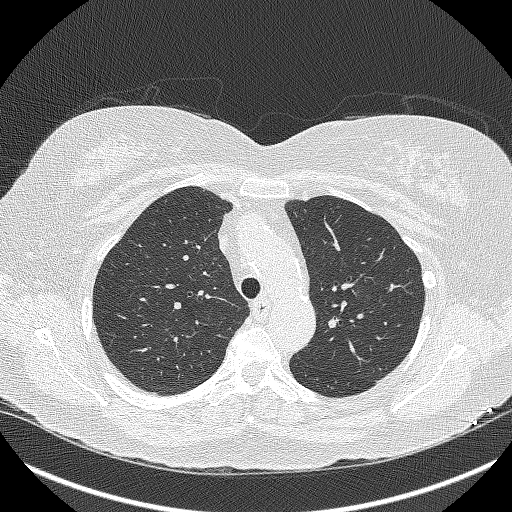
[im 234/308  lung]
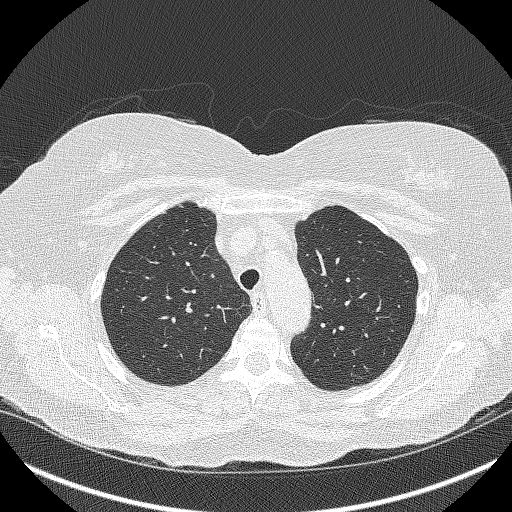
[im 264/308  lung]
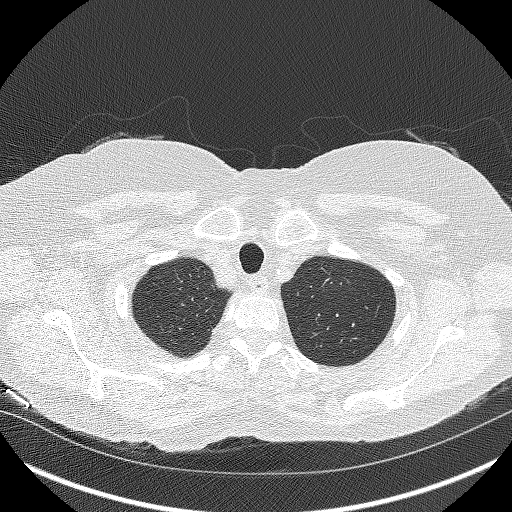
[im 293/308  lung]
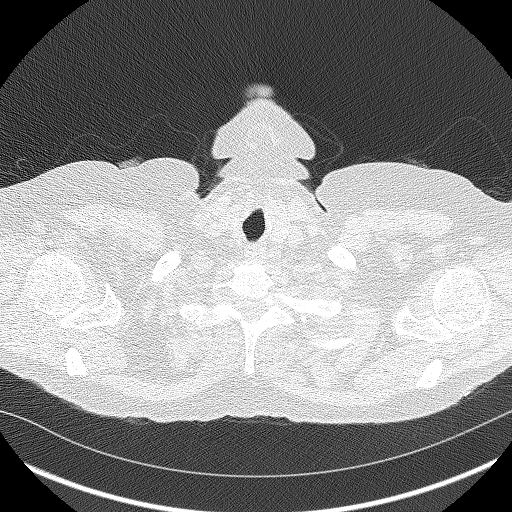

[Series 4: lung · coronal · 0.60mm/px · 3 of 277 slices shown (2 of 2)]
[im 56/277  lung]
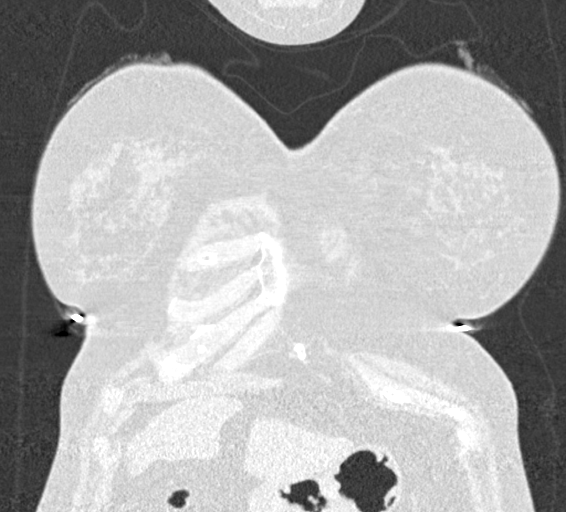
[im 111/277  lung]
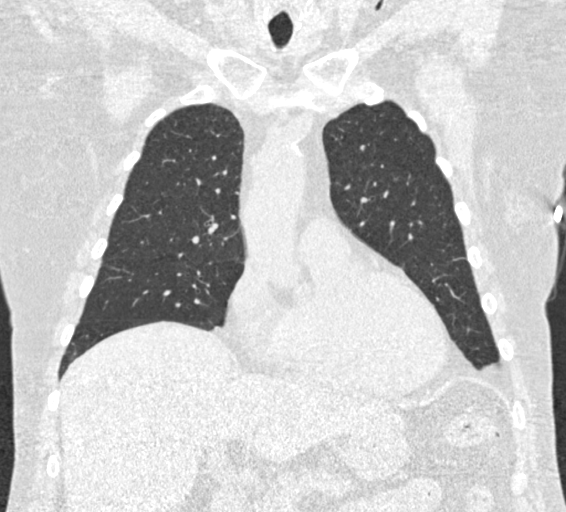
[im 166/277  lung]
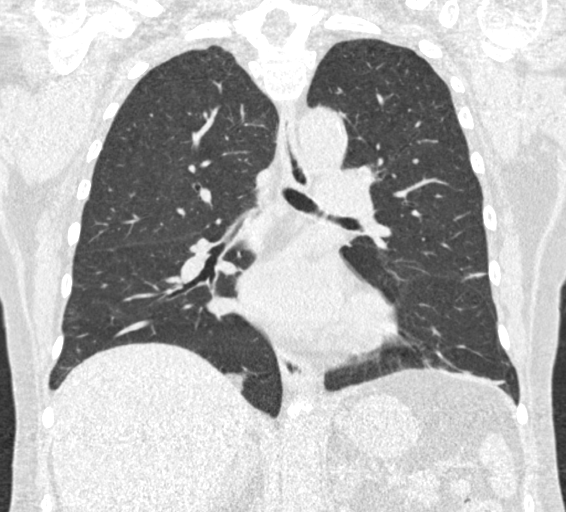

[15 of 40 positions shown; findings below may reference images not displayed]

FINDINGS: Cardiovascular: Heart is normal in size.  No pericardial effusion.

No evidence of thoracic aortic aneurysm. Atherosclerotic
calcifications of the aortic arch.

Mediastinum/Nodes: No suspicious mediastinal lymphadenopathy.

Visualized thyroid is unremarkable.

Lungs/Pleura: Mild scarring/atelectasis in the right middle lobe and
lingula.

No focal consolidation.

Benign calcified granulomata bilaterally. No suspicious pulmonary
nodules.

No pleural effusion or pneumothorax.

Upper Abdomen: Visualized upper abdomen is notable for prior
cholecystectomy and vascular calcifications.

Musculoskeletal: Visualized osseous structures are within normal
limits.
IMPRESSION: Lung-RADS 1, negative. Continue annual screening with low-dose chest
CT without contrast in 12 months.

Aortic Atherosclerosis ([id number]).

## 2020-09-10 ENCOUNTER — Encounter: Payer: Self-pay | Admitting: *Deleted

## 2020-12-04 DIAGNOSIS — C50919 Malignant neoplasm of unspecified site of unspecified female breast: Secondary | ICD-10-CM

## 2020-12-04 HISTORY — DX: Malignant neoplasm of unspecified site of unspecified female breast: C50.919

## 2021-02-23 ENCOUNTER — Other Ambulatory Visit: Payer: Self-pay | Admitting: Specialist

## 2021-02-23 DIAGNOSIS — R0609 Other forms of dyspnea: Secondary | ICD-10-CM

## 2021-02-23 DIAGNOSIS — R0600 Dyspnea, unspecified: Secondary | ICD-10-CM

## 2021-03-14 ENCOUNTER — Other Ambulatory Visit: Payer: Self-pay | Admitting: Family Medicine

## 2021-03-23 ENCOUNTER — Ambulatory Visit
Admission: RE | Admit: 2021-03-23 | Discharge: 2021-03-23 | Disposition: A | Discharge status: Home / Self Care | Payer: Medicare Other | Source: Ambulatory Visit | Attending: Specialist | Admitting: Specialist

## 2021-03-23 ENCOUNTER — Other Ambulatory Visit: Payer: Self-pay

## 2021-03-23 DIAGNOSIS — R0600 Dyspnea, unspecified: Secondary | ICD-10-CM | POA: Insufficient documentation

## 2021-03-23 DIAGNOSIS — R0609 Other forms of dyspnea: Secondary | ICD-10-CM

## 2021-03-29 ENCOUNTER — Other Ambulatory Visit: Payer: Self-pay | Admitting: Family Medicine

## 2021-03-29 DIAGNOSIS — Z780 Asymptomatic menopausal state: Secondary | ICD-10-CM

## 2021-04-29 ENCOUNTER — Encounter: Payer: Self-pay | Admitting: *Deleted

## 2021-05-03 ENCOUNTER — Other Ambulatory Visit: Payer: Self-pay

## 2021-05-03 ENCOUNTER — Ambulatory Visit: Payer: Medicare Other | Admitting: Anesthesiology

## 2021-05-03 ENCOUNTER — Ambulatory Visit
Admission: RE | Admit: 2021-05-03 | Discharge: 2021-05-03 | Disposition: A | Discharge status: Home / Self Care | Payer: Medicare Other | Attending: Gastroenterology | Admitting: Gastroenterology

## 2021-05-03 ENCOUNTER — Encounter: Payer: Self-pay | Admitting: Gastroenterology

## 2021-05-03 ENCOUNTER — Encounter
Admission: RE | Disposition: A | Discharge status: Home / Self Care | Payer: Self-pay | Source: Home / Self Care | Attending: Gastroenterology

## 2021-05-03 DIAGNOSIS — Z7982 Long term (current) use of aspirin: Secondary | ICD-10-CM | POA: Diagnosis not present

## 2021-05-03 DIAGNOSIS — Z791 Long term (current) use of non-steroidal anti-inflammatories (NSAID): Secondary | ICD-10-CM | POA: Insufficient documentation

## 2021-05-03 DIAGNOSIS — Z881 Allergy status to other antibiotic agents status: Secondary | ICD-10-CM | POA: Insufficient documentation

## 2021-05-03 DIAGNOSIS — K2980 Duodenitis without bleeding: Secondary | ICD-10-CM | POA: Insufficient documentation

## 2021-05-03 DIAGNOSIS — K3189 Other diseases of stomach and duodenum: Secondary | ICD-10-CM | POA: Diagnosis not present

## 2021-05-03 DIAGNOSIS — I10 Essential (primary) hypertension: Secondary | ICD-10-CM | POA: Insufficient documentation

## 2021-05-03 DIAGNOSIS — Z880 Allergy status to penicillin: Secondary | ICD-10-CM | POA: Diagnosis not present

## 2021-05-03 DIAGNOSIS — Z79899 Other long term (current) drug therapy: Secondary | ICD-10-CM | POA: Diagnosis not present

## 2021-05-03 DIAGNOSIS — E785 Hyperlipidemia, unspecified: Secondary | ICD-10-CM | POA: Diagnosis not present

## 2021-05-03 DIAGNOSIS — K589 Irritable bowel syndrome without diarrhea: Secondary | ICD-10-CM | POA: Diagnosis not present

## 2021-05-03 DIAGNOSIS — Z888 Allergy status to other drugs, medicaments and biological substances status: Secondary | ICD-10-CM | POA: Diagnosis not present

## 2021-05-03 DIAGNOSIS — B9681 Helicobacter pylori [H. pylori] as the cause of diseases classified elsewhere: Secondary | ICD-10-CM | POA: Diagnosis not present

## 2021-05-03 DIAGNOSIS — Z885 Allergy status to narcotic agent status: Secondary | ICD-10-CM | POA: Insufficient documentation

## 2021-05-03 DIAGNOSIS — K2950 Unspecified chronic gastritis without bleeding: Secondary | ICD-10-CM | POA: Diagnosis not present

## 2021-05-03 DIAGNOSIS — R1013 Epigastric pain: Secondary | ICD-10-CM | POA: Diagnosis present

## 2021-05-03 HISTORY — DX: Atherosclerotic heart disease of native coronary artery without angina pectoris: I25.10

## 2021-05-03 HISTORY — DX: Pain in unspecified hip: M25.559

## 2021-05-03 HISTORY — DX: Tremor, unspecified: R25.1

## 2021-05-03 HISTORY — DX: Lyme disease, unspecified: A69.20

## 2021-05-03 HISTORY — PX: ESOPHAGOGASTRODUODENOSCOPY: SHX5428

## 2021-05-03 HISTORY — DX: Occlusion and stenosis of unspecified carotid artery: I65.29

## 2021-05-03 HISTORY — DX: Unspecified convulsions: R56.9

## 2021-05-03 HISTORY — DX: Hyperlipidemia, unspecified: E78.5

## 2021-05-03 HISTORY — DX: Dorsalgia, unspecified: M54.9

## 2021-05-03 SURGERY — EGD (ESOPHAGOGASTRODUODENOSCOPY)
Anesthesia: General

## 2021-05-03 MED ORDER — PROPOFOL 10 MG/ML IV BOLUS
INTRAVENOUS | Status: AC
  Filled 2021-05-03: qty 40

## 2021-05-03 MED ORDER — SODIUM CHLORIDE 0.9 % IV SOLN: INTRAVENOUS | Status: DC

## 2021-05-03 MED ORDER — PROPOFOL 500 MG/50ML IV EMUL
INTRAVENOUS | Status: DC | PRN
  Administered 2021-05-03: 175 ug/kg/min via INTRAVENOUS

## 2021-05-03 MED ORDER — LIDOCAINE HCL (CARDIAC) PF 100 MG/5ML IV SOSY
PREFILLED_SYRINGE | INTRAVENOUS | Status: DC | PRN
  Administered 2021-05-03: 40 mg via INTRAVENOUS

## 2021-05-03 MED ORDER — PROPOFOL 10 MG/ML IV BOLUS
INTRAVENOUS | Status: DC | PRN
  Administered 2021-05-03: 50 mg via INTRAVENOUS

## 2021-05-03 NOTE — H&P (Signed)
Outpatient short stay form Pre-procedure 05/03/2021 8:59 AM Barbara Miyamoto MD, MPH  Primary Physician: Dr. Kym Groom  Reason for visit:  Dyspepsia  History of present illness:   70 y/o lady with history of IBS, hypertension, and HLD here for EGD due to dyspeptic symptoms. No family history of GI malignancies. No neck surgeries. No blood thinners. No significant abdominal surgeries except hysterectomy.    Current Facility-Administered Medications:  .  0.9 %  sodium chloride infusion, , Intravenous, Continuous, Paylin Hailu, Hilton Cork, MD, Last Rate: 20 mL/hr at 05/03/21 0839, New Bag at 05/03/21 0839  Medications Prior to Admission  Medication Sig Dispense Refill Last Dose  . albuterol (VENTOLIN HFA) 108 (90 Base) MCG/ACT inhaler Inhale into the lungs every 6 (six) hours as needed for wheezing or shortness of breath.   Past Month at Unknown time  . amLODipine (NORVASC) 5 MG tablet Take 5 mg by mouth daily.   05/02/2021 at Unknown time  . aspirin EC 81 MG tablet Take 81 mg by mouth daily.   05/02/2021 at Unknown time  . atorvastatin (LIPITOR) 40 MG tablet Take 40 mg by mouth daily.  11 05/02/2021 at Unknown time  . Cholecalciferol (VITAMIN D3) 50 MCG (2000 UT) CAPS Take by mouth.   05/02/2021 at Unknown time  . clobetasol ointment (TEMOVATE) 1.02 % Apply 1 application topically 2 (two) times daily.     . fluticasone (FLONASE) 50 MCG/ACT nasal spray Place 2 sprays into both nostrils daily.  2 05/02/2021 at Unknown time  . gabapentin (NEURONTIN) 600 MG tablet Take 600 mg by mouth 3 (three) times daily.  5 05/02/2021 at Unknown time  . ketoconazole (NIZORAL) 2 % shampoo Apply 1 application topically 2 (two) times a week.     Marland Kitchen lisinopril-hydrochlorothiazide (PRINZIDE,ZESTORETIC) 20-12.5 MG tablet Take 1 tablet by mouth daily.   05/02/2021 at Unknown time  . montelukast (SINGULAIR) 10 MG tablet Take 10 mg by mouth at bedtime.   05/02/2021 at Unknown time  . Multiple Vitamin (MULTIVITAMIN WITH MINERALS) TABS  tablet Take 1 tablet by mouth daily.   05/02/2021 at Unknown time  . naproxen (NAPROSYN) 500 MG tablet Take 500 mg by mouth 2 (two) times daily.   05/02/2021 at Unknown time  . omeprazole (PRILOSEC) 20 MG capsule Take 20 mg by mouth daily.   Past Week at Unknown time  . tiotropium (SPIRIVA) 18 MCG inhalation capsule Place 18 mcg into inhaler and inhale daily.   05/02/2021 at Unknown time  . venlafaxine XR (EFFEXOR-XR) 150 MG 24 hr capsule Take 150 mg by mouth daily with breakfast.   05/02/2021 at Unknown time  . Zinc Oxide 15 MG TBDP Take by mouth.   05/02/2021 at Unknown time  . dicyclomine (BENTYL) 10 MG/5ML solution Take by mouth 4 (four) times daily -  before meals and at bedtime. (Patient not taking: Reported on 05/03/2021)   Not Taking at Unknown time  . ipratropium (ATROVENT) 0.06 % nasal spray Place 2 sprays into both nostrils 3 (three) times daily.     Marland Kitchen triamterene-hydrochlorothiazide (DYAZIDE) 37.5-25 MG capsule Take 1 capsule by mouth every morning. (Patient not taking: Reported on 05/03/2021)  11 Not Taking at Unknown time     Allergies  Allergen Reactions  . Morphine And Related Itching  . Clindamycin Other (See Comments)  . Penicillins Other (See Comments)    Reaction: unknown Has patient had a PCN reaction causing immediate rash, facial/tongue/throat swelling, SOB or lightheadedness with hypotension: Unknown Has patient had a PCN  reaction causing severe rash involving mucus membranes or skin necrosis: Unknown Has patient had a PCN reaction that required hospitalization: Unknown Has patient had a PCN reaction occurring within the last 10 years: no If all of the above answers are "NO", then may proceed with Cephalosporin use. .  . Promethazine-Phenylephrine Other (See Comments)    Muscle spasm  . Tape Other (See Comments)    Redness and itching     Past Medical History:  Diagnosis Date  . Arthritis   . Asthma   . Back pain   . Carotid artery stenosis   . Carotid stenosis    . Coronary artery disease   . Depression   . Emphysema of lung ([place])   . Heart murmur   . Hip pain   . History of Lyme disease   . HLD (hyperlipidemia)   . Hyperlipemia   . Hypertension   . IBS (irritable bowel syndrome)   . IBS (irritable bowel syndrome)   . Lyme disease   . Sciatica   . Sciatica   . Seizures ([place])   . Tremor   . Vitamin D deficiency   . Vitamin D deficiency     Review of systems:  Otherwise negative.    Physical Exam  Gen: Alert, oriented. Appears stated age.  HEENT: PERRLA. Lungs: No respiratory distress CV: RRR Abd: soft, benign, no masses Ext: No edema    Planned procedures: Proceed with EGD. The patient understands the nature of the planned procedure, indications, risks, alternatives and potential complications including but not limited to bleeding, infection, perforation, damage to internal organs and possible oversedation/side effects from anesthesia. The patient agrees and gives consent to proceed.  Please refer to procedure notes for findings, recommendations and patient disposition/instructions.     Barbara Miyamoto MD, MPH Gastroenterology 05/03/2021  8:59 AM

## 2021-05-03 NOTE — Interval H&P Note (Signed)
History and Physical Interval Note:  05/03/2021 9:02 AM  Barbara Contreras  has presented today for surgery, with the diagnosis of GERD,LUQ PAIN.  The various methods of treatment have been discussed with the patient and family. After consideration of risks, benefits and other options for treatment, the patient has consented to  Procedure(s): ESOPHAGOGASTRODUODENOSCOPY (EGD) (N/A) as a surgical intervention.  The patient's history has been reviewed, patient examined, no change in status, stable for surgery.  I have reviewed the patient's chart and labs.  Questions were answered to the patient's satisfaction.     Lesly Rubenstein  Ok to proceed with EGD

## 2021-05-03 NOTE — Transfer of Care (Signed)
Immediate Anesthesia Transfer of Care Note  Patient: Barbara Contreras  Procedure(s) Performed: ESOPHAGOGASTRODUODENOSCOPY (EGD) (N/A )  Patient Location: Endoscopy Unit  Anesthesia Type:General  Level of Consciousness: awake, alert  and oriented  Airway & Oxygen Therapy: Patient Spontanous Breathing and Patient connected to nasal cannula oxygen  Post-op Assessment: Report given to RN and Post -op Vital signs reviewed and stable  Post vital signs: Reviewed and stable  Last Vitals:  Vitals Value Taken Time  BP    Temp    Pulse    Resp    SpO2      Last Pain:  Vitals:   05/03/21 0823  TempSrc: Temporal  PainSc: 0-No pain         Complications: No complications documented.

## 2021-05-03 NOTE — Anesthesia Postprocedure Evaluation (Signed)
Anesthesia Post Note  Patient: Barbara Contreras  Procedure(s) Performed: ESOPHAGOGASTRODUODENOSCOPY (EGD) (N/A )  Patient location during evaluation: Endoscopy Anesthesia Type: General Level of consciousness: awake and alert Pain management: pain level controlled Vital Signs Assessment: post-procedure vital signs reviewed and stable Respiratory status: spontaneous breathing, nonlabored ventilation, respiratory function stable and patient connected to nasal cannula oxygen Cardiovascular status: blood pressure returned to baseline and stable Postop Assessment: no apparent nausea or vomiting Anesthetic complications: no   No complications documented.   Last Vitals:  Vitals:   05/03/21 0932 05/03/21 0942  BP: 99/62 132/62  Pulse: (!) 58 (!) 57  Resp: 13 15  Temp: (!) 35.4 C (!) 35.9 C  SpO2: 93% 98%    Last Pain:  Vitals:   05/03/21 0942  TempSrc: Temporal  PainSc: 0-No pain                 Precious Haws Adryanna Friedt

## 2021-05-03 NOTE — Op Note (Signed)
[facility] Gastroenterology Patient Name: Barbara Contreras Procedure Date: 05/03/2021 9:04 AM MRN: [id number] Account #: [id number] Date of Birth: [birth year] Admit Type: Outpatient Age: 70 Room: [facility] ENDO ROOM 1 Gender: Female Note Status: Finalized Procedure:             Upper GI endoscopy Indications:           Dyspepsia Providers:             Andrey Farmer MD, MD Referring MD:          Wynona Canes. Kym Groom, MD (Referring MD) Medicines:             Monitored Anesthesia Care Complications:         No immediate complications. Estimated blood loss:                         Minimal. Procedure:             Pre-Anesthesia Assessment:                        - Prior to the procedure, a History and Physical was                         performed, and patient medications and allergies were                         reviewed. The patient is competent. The risks and                         benefits of the procedure and the sedation options and                         risks were discussed with the patient. All questions                         were answered and informed consent was obtained.                         Patient identification and proposed procedure were                         verified by the physician, the nurse, the anesthetist                         and the technician in the endoscopy suite. Mental                         Status Examination: alert and oriented. Airway                         Examination: normal oropharyngeal airway and neck                         mobility. Respiratory Examination: clear to                         auscultation. CV Examination: normal. Prophylactic                         Antibiotics:  The patient does not require prophylactic                         antibiotics. Prior Anticoagulants: The patient has                         taken no previous anticoagulant or antiplatelet                         agents. ASA Grade Assessment: II - A  patient with mild                         systemic disease. After reviewing the risks and                         benefits, the patient was deemed in satisfactory                         condition to undergo the procedure. The anesthesia                         plan was to use monitored anesthesia care (MAC).                         Immediately prior to administration of medications,                         the patient was re-assessed for adequacy to receive                         sedatives. The heart rate, respiratory rate, oxygen                         saturations, blood pressure, adequacy of pulmonary                         ventilation, and response to care were monitored                         throughout the procedure. The physical status of the                         patient was re-assessed after the procedure.                        After obtaining informed consent, the endoscope was                         passed under direct vision. Throughout the procedure,                         the patient's blood pressure, pulse, and oxygen                         saturations were monitored continuously. The Endoscope                         was introduced through the mouth, and advanced to the  second part of duodenum. The upper GI endoscopy was                         accomplished without difficulty. The patient tolerated                         the procedure well. Findings:      The examined esophagus was normal.      Patchy mild inflammation characterized by erythema was found in the       gastric antrum. Biopsies were taken with a cold forceps for Helicobacter       pylori testing. Estimated blood loss was minimal.      Patchy moderately erythematous mucosa without active bleeding and with       no stigmata of bleeding was found in the duodenal bulb. Biopsies were       taken with a cold forceps for histology. Estimated blood loss was        minimal. Impression:            - Normal esophagus.                        - Gastritis. Biopsied.                        - Erythematous duodenopathy. Biopsied. Recommendation:        - Discharge patient to home.                        - Resume previous diet.                        - Continue present medications.                        - Await pathology results.                        - Return to referring physician as previously                         scheduled. Procedure Code(s):     --- Professional ---                        [phone], Esophagogastroduodenoscopy, flexible,                         transoral; with biopsy, single or multiple Diagnosis Code(s):     --- Professional ---                        K29.70, Gastritis, unspecified, without bleeding                        K31.89, Other diseases of stomach and duodenum                        R10.13, Epigastric pain CPT copyright 2019 [employer]. All rights reserved. The codes documented in this report are preliminary and upon coder review may  be revised to meet current compliance requirements. Andrey Farmer MD, MD 05/03/2021 9:21:23 AM Number of Addenda: 0 Note Initiated On: 05/03/2021 9:04  AM Estimated Blood Loss:  Estimated blood loss was minimal.      [facility]

## 2021-05-03 NOTE — Anesthesia Preprocedure Evaluation (Signed)
Anesthesia Evaluation  Patient identified by MRN, date of birth, ID band Patient awake    Reviewed: Allergy & Precautions, H&P , NPO status , Patient's Chart, lab work & pertinent test results, reviewed documented beta blocker date and time   History of Anesthesia Complications Negative for: history of anesthetic complications  Airway Mallampati: III  TM Distance: <3 FB Neck ROM: limited    Dental no notable dental hx. (+) Dental Advidsory Given, Caps, Missing, Poor Dentition, Chipped   Pulmonary shortness of breath and with exertion, asthma , neg sleep apnea, COPD,  COPD inhaler, neg recent URI, Current Smoker,           Cardiovascular Exercise Tolerance: Good hypertension, (-) angina+ CAD and + Peripheral Vascular Disease (Carotid stenosis)  (-) Past MI, (-) Cardiac Stents and (-) CABG (-) dysrhythmias + Valvular Problems/Murmurs      Neuro/Psych Seizures -,  PSYCHIATRIC DISORDERS Depression  Neuromuscular disease    GI/Hepatic negative GI ROS, Neg liver ROS,   Endo/Other  negative endocrine ROS  Renal/GU negative Renal ROS  negative genitourinary   Musculoskeletal  (+) Arthritis ,   Abdominal   Peds  Hematology negative hematology ROS (+)   Anesthesia Other Findings Past Medical History: No date: Arthritis No date: Asthma No date: Carotid stenosis No date: Depression No date: Emphysema of lung (HCC) No date: Heart murmur No date: History of Lyme disease No date: HLD (hyperlipidemia) No date: Hypertension No date: IBS (irritable bowel syndrome) No date: Sciatica No date: Vitamin D deficiency   Reproductive/Obstetrics negative OB ROS                             Anesthesia Physical  Anesthesia Plan  ASA: III  Anesthesia Plan: General   Post-op Pain Management:    Induction: Intravenous  PONV Risk Score and Plan: 2 and Propofol infusion and TIVA  Airway Management  Planned: Natural Airway and Nasal Cannula  Additional Equipment:   Intra-op Plan:   Post-operative Plan:   Informed Consent: I have reviewed the patients History and Physical, chart, labs and discussed the procedure including the risks, benefits and alternatives for the proposed anesthesia with the patient or authorized representative who has indicated his/her understanding and acceptance.     Dental Advisory Given  Plan Discussed with: Anesthesiologist, CRNA and Surgeon  Anesthesia Plan Comments: (Patient consented for risks of anesthesia including but not limited to:  - adverse reactions to medications - risk of airway placement if required - damage to eyes, teeth, lips or other oral mucosa - nerve damage due to positioning  - sore throat or hoarseness - Damage to heart, brain, nerves, lungs, other parts of body or loss of life  Patient voiced understanding.)        Anesthesia Quick Evaluation

## 2021-05-04 ENCOUNTER — Encounter: Payer: Self-pay | Admitting: Gastroenterology

## 2021-05-04 LAB — SURGICAL PATHOLOGY

## 2021-06-13 ENCOUNTER — Other Ambulatory Visit: Payer: Self-pay | Admitting: Family Medicine

## 2021-06-13 DIAGNOSIS — Z1231 Encounter for screening mammogram for malignant neoplasm of breast: Secondary | ICD-10-CM

## 2021-07-05 ENCOUNTER — Other Ambulatory Visit: Payer: Self-pay

## 2021-07-05 ENCOUNTER — Ambulatory Visit
Admission: RE | Admit: 2021-07-05 | Discharge: 2021-07-05 | Disposition: A | Discharge status: Home / Self Care | Payer: Medicare Other | Source: Ambulatory Visit | Attending: Family Medicine | Admitting: Family Medicine

## 2021-07-05 DIAGNOSIS — Z1231 Encounter for screening mammogram for malignant neoplasm of breast: Secondary | ICD-10-CM

## 2021-07-12 ENCOUNTER — Other Ambulatory Visit: Payer: Self-pay | Admitting: Family Medicine

## 2021-07-12 DIAGNOSIS — R928 Other abnormal and inconclusive findings on diagnostic imaging of breast: Secondary | ICD-10-CM

## 2021-07-12 DIAGNOSIS — N6310 Unspecified lump in the right breast, unspecified quadrant: Secondary | ICD-10-CM

## 2021-07-19 ENCOUNTER — Ambulatory Visit
Admission: RE | Admit: 2021-07-19 | Discharge: 2021-07-19 | Disposition: A | Discharge status: Home / Self Care | Payer: Medicare Other | Source: Ambulatory Visit | Attending: Family Medicine | Admitting: Family Medicine

## 2021-07-19 ENCOUNTER — Other Ambulatory Visit: Payer: Self-pay

## 2021-07-19 DIAGNOSIS — N6310 Unspecified lump in the right breast, unspecified quadrant: Secondary | ICD-10-CM

## 2021-07-19 DIAGNOSIS — R928 Other abnormal and inconclusive findings on diagnostic imaging of breast: Secondary | ICD-10-CM

## 2021-07-20 ENCOUNTER — Other Ambulatory Visit: Payer: Self-pay | Admitting: Family Medicine

## 2021-07-20 DIAGNOSIS — R928 Other abnormal and inconclusive findings on diagnostic imaging of breast: Secondary | ICD-10-CM

## 2021-07-20 DIAGNOSIS — N6310 Unspecified lump in the right breast, unspecified quadrant: Secondary | ICD-10-CM

## 2021-07-22 ENCOUNTER — Ambulatory Visit
Admission: RE | Admit: 2021-07-22 | Discharge: 2021-07-22 | Disposition: A | Discharge status: Home / Self Care | Payer: Medicare Other | Source: Ambulatory Visit | Attending: Family Medicine | Admitting: Family Medicine

## 2021-07-22 ENCOUNTER — Other Ambulatory Visit: Payer: Self-pay

## 2021-07-22 DIAGNOSIS — N6310 Unspecified lump in the right breast, unspecified quadrant: Secondary | ICD-10-CM

## 2021-07-22 DIAGNOSIS — R928 Other abnormal and inconclusive findings on diagnostic imaging of breast: Secondary | ICD-10-CM

## 2021-07-22 HISTORY — PX: BREAST BIOPSY: SHX20

## 2021-07-25 ENCOUNTER — Encounter: Payer: Self-pay | Admitting: *Deleted

## 2021-07-25 DIAGNOSIS — C50911 Malignant neoplasm of unspecified site of right female breast: Secondary | ICD-10-CM

## 2021-07-26 NOTE — Progress Notes (Signed)
Received message from Barbara Sniff, RN that patient had been informed of her new diagnosis of breast cancer and is ready for navigation to call her.   Called patient to establish navigation services.  I have her scheduled to see Dr. Lysle Contreras on 07/27/21 @ 1:30 and Dr. Rogue Contreras on 08/03/21 @ 2:15.  Dr. Ines Contreras office to give patient her breast cancer educational literature, "My Breast Cancer Treatment Handbook" by Barbara Igo, RN.   Encouraged patient to call with any question so needs.

## 2021-07-27 ENCOUNTER — Ambulatory Visit: Payer: Self-pay | Admitting: Surgery

## 2021-07-27 NOTE — H&P (Signed)
Subjective:   CC: Malignant neoplasm of upper-inner quadrant of right female breast, unspecified estrogen receptor status (CMS-HCC) [C50.211] HPI:  Barbara Contreras is a 70 y.o. female who was referred by Valera Castle, MD for evaluation of above. Change was noted on last screening mammogram. Patient does not routinely do self breast exams.  Patient does not have noted a change on breast exam. Age of menarche was 3. Hysterectomy when patient was in 20s.  Patient denies hormonal therapy. Patient is G5P0. Patient denies nipple discharge. Patient denies previous breast biopsy. Patient denies a personal history of breast cancer.   Past Medical History:  has a past medical history of Arthritis, Asthma without status asthmaticus, unspecified, COPD (chronic obstructive pulmonary disease) (CMS-HCC), Depression, carotid artery stenosis, Lyme disease, Hyperlipidemia, Hypertension, Irritable bowel syndrome with diarrhea, Sciatica, Seizures (CMS-HCC), Tremor, and Vitamin D deficiency.  Past Surgical History:  has a past surgical history that includes Carotid endarterectomy (Right, 2014); Hysterectomy (2000); Cholecystectomy (2016); Paranasal Sinusotomy (1986); Colonoscopy (09/13/2018); Oophorectomy; and egd (05/03/2021).  Family History: family history includes Alcohol abuse in her father and mother; Alzheimer's disease in her father; Anxiety in her father and sister; Bipolar disorder in her sister; Coronary Artery Disease (Blocked arteries around heart) in her mother; Depression in her father and sister; Diabetes type II in her mother and sister; Glaucoma in her mother; High blood pressure (Hypertension) in her mother and sister; Hyperlipidemia (Elevated cholesterol) in her mother; Obesity in her mother; Thyroid disease in her mother and sister.  Social History:  reports that she has been smoking cigarettes. She has been smoking about 0.50 packs per day. She has never used smokeless tobacco. She reports  current alcohol use. She reports current drug use. Drug: Marijuana.  Current Medications: has a current medication list which includes the following prescription(s): albuterol, amlodipine, aspirin, atorvastatin, baclofen, cholecalciferol, clobetasol, dicyclomine, fluticasone propionate, gabapentin, ketoconazole, lisinopril-hydrochlorothiazide, montelukast, multivitamin with iron-minerals, naproxen, omeprazole, pantoprazole, tiotropium, venlafaxine, zinc oxide, and ipratropium.  Allergies:  Allergies as of 07/27/2021 - Reviewed 07/27/2021  Allergen Reaction Noted   Morphine Itching and Hallucination 07/08/2015   Penicillin Unknown 07/08/2015   Adhesive tape-silicones Other (See Comments) 06/11/2018   Clindamycin Muscle Pain 08/15/2016   Phenergan vc [promethazine-phenylephrine] Other (See Comments) 08/15/2016    ROS:  A 15 point review of systems was performed and was negative except as noted in HPI   Objective:     BP 130/79   Pulse 82   Ht 162.6 cm (5' 4" )   Wt 77.6 kg (171 lb)   BMI 29.35 kg/m   Constitutional :  alert, appears stated age, cooperative and no distress  Lymphatics/Throat:  no asymmetry, masses, or scars  Respiratory:  clear to auscultation bilaterally  Cardiovascular:  regular rate and rhythm, S1, S2 normal, no murmur, click, rub or gallop and regular rate and rhythm  Gastrointestinal: soft, non-tender; bowel sounds normal; no masses,  no organomegaly.   Musculoskeletal: Steady gait and movement  Skin: Cool and moist  Psychiatric: Normal affect, non-agitated, not confused  Breast:  Chaperone present for exam.  breasts appear normal, no suspicious masses, no skin or nipple changes or axillary nodes.    LABS:  SURGICAL PATHOLOGY  CASE: [phone]  PATIENT: [facility]  Surgical Pathology Report      Specimen Submitted:  A. Breast, right, 1:00, 8 CMFN; biopsy  B. Breast, right, 2:00, 6 CMFN; biopsy   Clinical History: Two right breast masses: 1.  8 mm spiculated mass at 1  o'clock,  suspicious for malignancy. 2. 7 mm oval mass at 2 o'clock,  favor fibroadenoma. The two masses are 4.2 cm apart.  Post biopsy mammograms show appropriate positioning of the Venus shaped  biopsy marking clip in the right breast 1 o'clock position, and the  ribbon shaped biopsy marking clip in the right breast 2 o'clock  position.      DIAGNOSIS:  A. 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   Size of invasive carcinoma: 4 mm in this sample  Histologic grade of invasive carcinoma: Grade 1                       Glandular/tubular differentiation score: 1                       Nuclear pleomorphism score: 1                       Mitotic rate score: 1                       Total score: 3  Ductal carcinoma in situ: Not identified  Lymphovascular invasion: Not identified   ER/PR/HER2: Immunohistochemistry will be performed on block A2, with  reflex to [place] for HER2 2+. The results will be reported in an addendum.   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 Definitive classification and  grade will be assigned on the excisional specimen.   B. BREAST, RIGHT, 2 O'CLOCK 6 CM FROM NIPPLE (RIBBON CLIP);  ULTRASOUND-GUIDED CORE BIOPSY:  - FIBROADENOMA.  - NEGATIVE FOR ATYPIA AND MALIGNANCY.   GROSS DESCRIPTION:  A. Labeled: Right breast 1:00 8 cm from nipple  Received: Formalin  Time/date in fixative: Collected and placed in formalin at 8:37 AM on  07/22/2021  Cold ischemic time: Less than 1 minute  Total fixation time: Approximately 8.75 hours  Core pieces: 4  Size: Ranges from 0.6-1.5 cm in length and ranges from 0.1-0.2 cm in  diameter  Description: Received are yellow to tan fibrofatty cores of tissue and 2  additional fragments of fibrofatty tissue ranging from 0.1 to 0.2 cm.  Ink color: Blue  Entirely submitted  in 2 cassettes with 2 cores in A1 and 2 cores with  the remaining fragments in A2.   B. Labeled: Right breast 2:00 6 cm from the nipple  Received: Formalin  Time/date in fixative: Collected and placed in formalin at 8:54 AM on  07/22/2021  Cold ischemic time: Less than 1 minute  Total fixation time: Approximately 8.5 hours  Core pieces: 3  Size: Ranges from 0.8-1.4 cm in length and ranges from 0.1-0.3 cm in  diameter  Description: Yellow to tan fibrofatty cores of tissue  Ink color: Black  Entirely submitted in 1 cassette.    [place] 07/22/2021   Final Diagnosis performed by Bryan Lemma, MD.   Electronically signed  07/25/2021 11:20:07AM  The electronic signature indicates that the named Attending Pathologist  has evaluated the specimen  Technical component performed at [facility], [address], [place],  [place] [address] Lab: [phone] Dir: Rush Farmer, MD, MMM   Professional component performed at [facility], [facility], [place], [place], [place] [address] Lab: [phone]  Dir: Dellia Nims. Rubinas, MD   RADS: CLINICAL DATA:  Screening recall for a possible  right breast mass.   EXAM:  DIGITAL DIAGNOSTIC UNILATERAL RIGHT MAMMOGRAM WITH TOMOSYNTHESIS AND  CAD; ULTRASOUND RIGHT BREAST LIMITED   TECHNIQUE:  Right digital diagnostic mammography and breast tomosynthesis was  performed. The images were evaluated with computer-aided detection.;  Targeted ultrasound examination of the right breast was performed   COMPARISON:  Previous exam(s).   ACR Breast Density Category c: The breast tissue is heterogeneously  dense, which may obscure small masses.   FINDINGS:  Spot compression tomosynthesis images through the superior slightly  medial far posterior right breast demonstrates a 5-6 mm spiculated  mass.   Ultrasound targeted to the right breast at 1 o'clock, 8 cm from the  nipple demonstrates an irregular hypoechoic shadowing mass measuring  8 x 5 x 6  mm. At 2 o'clock, 6 cm from the nipple there is an oval  hypoechoic mass measuring 7 x 3 x 4 mm. These 2 masses are 4.2 cm  apart.   Ultrasound of right axilla demonstrates multiple normal-appearing  lymph nodes.   IMPRESSION:  1. There is a highly suspicious mass in the right breast at 1  o'clock measuring 8 mm.   2. There is an indeterminate right breast mass at 2 o'clock  measuring 7 mm.   3.  No evidence of right axillary lymphadenopathy.   RECOMMENDATION:  Ultrasound guided biopsy is recommended for the 2 right breast  masses. We will schedule the patient for the procedure at her  earliest convenience is after we obtain the appropriate order from  her physician.   I have discussed the findings and recommendations with the patient.  If applicable, a reminder letter will be sent to the patient  regarding the next appointment.   BI-RADS CATEGORY  5: Highly suggestive of malignancy.    Assessment:   Malignant neoplasm of upper-inner quadrant of right female breast, unspecified estrogen receptor status (CMS-HCC) [C50.211]   Called radiologist and confirmed area of fibroadenoma is concordant with biopsy results.  Plan:     1. Malignant neoplasm of upper-inner quadrant of right female breast, unspecified estrogen receptor status (CMS-HCC) [C50.211]  Discussed the risk of surgery including recurrence, chronic pain, post-op infxn, poor/delayed wound healing, poor cosmesis, seroma, hematoma formation, and possible re-operation to address said risks. The risks of general anesthetic, if used, includes MI, CVA, sudden death or even reaction to anesthetic medications also discussed.  Typical post-op recovery time and possbility of activity restrictions were also discussed.  Alternatives include continued observation.  Benefits include possible symptom relief, pathologic evaluation, and/or curative excision.   The patient verbalized understanding and all questions were answered to the  patient's satisfaction.  2. Patient has elected to proceed with surgical treatment. Procedure will be scheduled. Right partial mastectomy with RF tag, sentinel lymph node biopsy.

## 2021-07-28 ENCOUNTER — Other Ambulatory Visit: Payer: Self-pay | Admitting: Surgery

## 2021-07-28 DIAGNOSIS — C50211 Malignant neoplasm of upper-inner quadrant of right female breast: Secondary | ICD-10-CM

## 2021-07-29 LAB — SURGICAL PATHOLOGY

## 2021-08-01 ENCOUNTER — Ambulatory Visit
Admission: RE | Admit: 2021-08-01 | Discharge: 2021-08-01 | Disposition: A | Discharge status: Home / Self Care | Payer: Medicare Other | Source: Ambulatory Visit | Attending: Surgery | Admitting: Surgery

## 2021-08-01 ENCOUNTER — Other Ambulatory Visit: Payer: Self-pay

## 2021-08-01 ENCOUNTER — Other Ambulatory Visit: Payer: Self-pay | Admitting: Surgery

## 2021-08-01 DIAGNOSIS — C50211 Malignant neoplasm of upper-inner quadrant of right female breast: Secondary | ICD-10-CM | POA: Insufficient documentation

## 2021-08-03 ENCOUNTER — Inpatient Hospital Stay: Payer: Medicare Other

## 2021-08-03 ENCOUNTER — Inpatient Hospital Stay: Payer: Medicare Other | Attending: Internal Medicine | Admitting: Internal Medicine

## 2021-08-03 ENCOUNTER — Other Ambulatory Visit: Payer: Self-pay

## 2021-08-03 ENCOUNTER — Encounter: Payer: Self-pay | Admitting: Internal Medicine

## 2021-08-03 DIAGNOSIS — Z170 Estrogen receptor positive status [ER+]: Secondary | ICD-10-CM | POA: Insufficient documentation

## 2021-08-03 DIAGNOSIS — I2510 Atherosclerotic heart disease of native coronary artery without angina pectoris: Secondary | ICD-10-CM | POA: Insufficient documentation

## 2021-08-03 DIAGNOSIS — E785 Hyperlipidemia, unspecified: Secondary | ICD-10-CM | POA: Diagnosis not present

## 2021-08-03 DIAGNOSIS — F17210 Nicotine dependence, cigarettes, uncomplicated: Secondary | ICD-10-CM | POA: Diagnosis not present

## 2021-08-03 DIAGNOSIS — C50411 Malignant neoplasm of upper-outer quadrant of right female breast: Secondary | ICD-10-CM | POA: Insufficient documentation

## 2021-08-03 DIAGNOSIS — Z79899 Other long term (current) drug therapy: Secondary | ICD-10-CM | POA: Diagnosis not present

## 2021-08-03 DIAGNOSIS — Z7982 Long term (current) use of aspirin: Secondary | ICD-10-CM | POA: Diagnosis not present

## 2021-08-03 DIAGNOSIS — I10 Essential (primary) hypertension: Secondary | ICD-10-CM | POA: Diagnosis not present

## 2021-08-03 DIAGNOSIS — K589 Irritable bowel syndrome without diarrhea: Secondary | ICD-10-CM | POA: Diagnosis not present

## 2021-08-03 LAB — CBC WITH DIFFERENTIAL/PLATELET
Abs Immature Granulocytes: 0.02 10*3/uL (ref 0.00–0.07)
Basophils Absolute: 0.1 10*3/uL (ref 0.0–0.1)
Basophils Relative: 1 %
Eosinophils Absolute: 0.1 10*3/uL (ref 0.0–0.5)
Eosinophils Relative: 1 %
HCT: 44.6 % (ref 36.0–46.0)
Hemoglobin: 15.4 g/dL — ABNORMAL HIGH (ref 12.0–15.0)
Immature Granulocytes: 0 %
Lymphocytes Relative: 28 %
Lymphs Abs: 2 10*3/uL (ref 0.7–4.0)
MCH: 32.7 pg (ref 26.0–34.0)
MCHC: 34.5 g/dL (ref 30.0–36.0)
MCV: 94.7 fL (ref 80.0–100.0)
Monocytes Absolute: 0.5 10*3/uL (ref 0.1–1.0)
Monocytes Relative: 7 %
Neutro Abs: 4.5 10*3/uL (ref 1.7–7.7)
Neutrophils Relative %: 63 %
Platelets: 184 10*3/uL (ref 150–400)
RBC: 4.71 MIL/uL (ref 3.87–5.11)
RDW: 13.2 % (ref 11.5–15.5)
WBC: 7.1 10*3/uL (ref 4.0–10.5)
nRBC: 0 % (ref 0.0–0.2)

## 2021-08-03 LAB — COMPREHENSIVE METABOLIC PANEL
ALT: 42 U/L (ref 0–44)
AST: 30 U/L (ref 15–41)
Albumin: 4.1 g/dL (ref 3.5–5.0)
Alkaline Phosphatase: 56 U/L (ref 38–126)
Anion gap: 8 (ref 5–15)
BUN: 24 mg/dL — ABNORMAL HIGH (ref 8–23)
CO2: 29 mmol/L (ref 22–32)
Calcium: 9.3 mg/dL (ref 8.9–10.3)
Chloride: 100 mmol/L (ref 98–111)
Creatinine, Ser: 0.88 mg/dL (ref 0.44–1.00)
GFR, Estimated: >60 mL/min (ref >60)
Glucose, Bld: 107 mg/dL — ABNORMAL HIGH (ref 70–99)
Potassium: 3.6 mmol/L (ref 3.5–5.1)
Sodium: 137 mmol/L (ref 135–145)
Total Bilirubin: 0.4 mg/dL (ref 0.3–1.2)
Total Protein: 7 g/dL (ref 6.5–8.1)

## 2021-08-03 LAB — APTT: aPTT: 27 seconds (ref 24–36)

## 2021-08-03 LAB — PROTIME-INR
INR: 1 (ref 0.8–1.2)
Prothrombin Time: 13.2 seconds (ref 11.4–15.2)

## 2021-08-03 NOTE — Progress Notes (Signed)
Patient here for initial oncology appointment, concerns of SOB from smoking

## 2021-08-03 NOTE — Progress Notes (Signed)
one [place] NOTE  Patient Care Team: Valera Castle, MD as PCP - General (Family Medicine) Rico Junker, RN as Oncology Nurse Navigator  CHIEF COMPLAINTS/PURPOSE OF CONSULTATION: Breast cancer  #  Oncology History Overview Note  # AUG 2022[facility] -Tubular; G-1 ER > 90%; PR 51-90%; her 2=0; [Dr.Sakai]  IMPRESSION: 1. There is a highly suspicious mass in the right breast at 1 o'clock measuring 8 mm.   2. There is an indeterminate right breast mass at 2 o'clock measuring 7 mm.   3.  No evidence of right axillary lymphadenopathy.   Carcinoma of upper-outer quadrant of right breast in female, estrogen receptor positive ([place])  08/03/2021 Initial Diagnosis   Carcinoma of upper-outer quadrant of right breast in female, estrogen receptor positive ([place])      HISTORY OF PRESENTING ILLNESS:  Barbara Contreras 70 y.o.  female female with no prior history of breast cancer/or malignancies has been referred to [place] for further evaluation recommendations for new diagnosis of breast cancer.   Patient states she was found to have an abnormal screening mammogram which led to diagnostic mammogram/ultrasound/followed by biopsy-as summarized above.  Patient has met with surgeon.  Awaiting surgery in September fourths.    Review of Systems  Constitutional:  Negative for chills, diaphoresis, fever, malaise/fatigue and weight loss.  HENT:  Negative for nosebleeds and sore throat.   Eyes:  Negative for double vision.  Respiratory:  Negative for cough, hemoptysis, sputum production, shortness of breath and wheezing.   Cardiovascular:  Negative for chest pain, palpitations, orthopnea and leg swelling.  Gastrointestinal:  Negative for abdominal pain, blood in stool, constipation, diarrhea, heartburn, melena, nausea and vomiting.  Genitourinary:  Negative for dysuria, frequency and urgency.  Musculoskeletal:  Positive for back pain and joint pain.  Skin: Negative.   Negative for itching and rash.  Neurological:  Negative for dizziness, tingling, focal weakness, weakness and headaches.  Endo/Heme/Allergies:  Does not bruise/bleed easily.  Psychiatric/Behavioral:  Negative for depression. The patient is not nervous/anxious and does not have insomnia.     MEDICAL HISTORY:  Past Medical History:  Diagnosis Date   Arthritis    Asthma    Back pain    Carotid artery stenosis    Carotid stenosis    Coronary artery disease    Depression    Emphysema of lung (HCC)    GERD (gastroesophageal reflux disease)    Heart murmur    Hip pain    History of Lyme disease    HLD (hyperlipidemia)    Hyperlipemia    Hypertension    IBS (irritable bowel syndrome)    IBS (irritable bowel syndrome)    Lyme disease    Pre-diabetes    Sciatica    Sciatica    Seizures (HCC)    Tremor    Vitamin D deficiency    Vitamin D deficiency     SURGICAL HISTORY: Past Surgical History:  Procedure Laterality Date   ABDOMINAL HYSTERECTOMY     BREAST BIOPSY Right 07/22/2021   [place] bx 1:00 area, venus marker, IMC tubular features   BREAST BIOPSY Right 07/22/2021   [place] bx 2:00 ribbon marker, fibroadenoma   BREAST LUMPECTOMY Right 08/24/2021   re-excision   CAROTID ENDARTERECTOMY Right    CHOLECYSTECTOMY     COLONOSCOPY WITH PROPOFOL N/A 09/13/2018   Procedure: COLONOSCOPY WITH PROPOFOL;  Surgeon: Lollie Sails, MD;  Location: [facility] ENDOSCOPY;  Service: Endoscopy;  Laterality: N/A;   ESOPHAGOGASTRODUODENOSCOPY N/A 05/03/2021  Procedure: ESOPHAGOGASTRODUODENOSCOPY (EGD);  Surgeon: Lesly Rubenstein, MD;  Location: [facility] ENDOSCOPY;  Service: Endoscopy;  Laterality: N/A;   paranasal sinusotomy     PART MASTECTOMY,RADIO FREQUENCY LOCALIZER,AXILLARY SENTINEL NODE BIOPSY Right 08/12/2021   Procedure: PART MASTECTOMY,RADIO FREQUENCY LOCALIZER,AXILLARY SENTINEL NODE BIOPSY;  Surgeon: Benjamine Sprague, DO;  Location: [facility] ORS;  Service: General;  Laterality: Right;   RE-EXCISION OF  BREAST CANCER,SUPERIOR MARGINS Right 08/24/2021   Procedure: RE-EXCISION OF BREAST CANCER,SUPERIOR MARGINS;  Surgeon: Benjamine Sprague, DO;  Location: [facility] ORS;  Service: General;  Laterality: Right;    SOCIAL HISTORY: Social History   Socioeconomic History   Marital status: Divorced    Spouse name: Not on file   Number of children: Not on file   Years of education: Not on file   Highest education level: Not on file  Occupational History   Not on file  Tobacco Use   Smoking status: Every Day    Packs/day: 0.50    Years: 55.00    Pack years: 27.50    Types: Cigarettes   Smokeless tobacco: Never  Vaping Use   Vaping Use: Never used  Substance and Sexual Activity   Alcohol use: Yes    Alcohol/week: 0.0 standard drinks    Comment: occasional   Drug use: Yes    Types: Marijuana   Sexual activity: Not on file  Other Topics Concern   Not on file  Social History Narrative   [place] ~25-30 mins; lives by self; no children/husband; sister near by. Smoker 1ppd/marijuana. Alcohol- beer/liqqor; retd from sales.    Social Determinants of Health   Financial Resource Strain: Not on file  Food Insecurity: Not on file  Transportation Needs: Not on file  Physical Activity: Not on file  Stress: Not on file  Social Connections: Not on file  Intimate Partner Violence: Not on file    FAMILY HISTORY: Family History  Problem Relation Age of Onset   Alcohol abuse Father    Alzheimer's disease Father    Anxiety disorder Father    Depression Father    Alcohol abuse Mother    CAD Mother    Diabetes Mother    Hyperlipidemia Mother    Depression Mother     ALLERGIES:  is allergic to morphine and related, clindamycin, penicillins, promethazine-phenylephrine, and tape.  MEDICATIONS:  Current Outpatient Medications  Medication Sig Dispense Refill   albuterol (VENTOLIN HFA) 108 (90 Base) MCG/ACT inhaler Inhale 1-2 puffs into the lungs every 6 (six) hours as needed for wheezing or  shortness of breath.     amLODipine (NORVASC) 5 MG tablet Take 5 mg by mouth in the morning and at bedtime.     aspirin EC 81 MG tablet Take 81 mg by mouth daily.     atorvastatin (LIPITOR) 40 MG tablet Take 40 mg by mouth every morning.  11   baclofen (LIORESAL) 10 MG tablet Take 5-10 mg by mouth at bedtime as needed for muscle spasms.     calcium carbonate (TUMS EX) 750 MG chewable tablet Chew 1 tablet by mouth daily as needed for heartburn.     dicyclomine (BENTYL) 10 MG capsule Take 10 mg by mouth in the morning and at bedtime.     fluticasone (FLONASE) 50 MCG/ACT nasal spray Place 2 sprays into both nostrils daily as needed for allergies.  2   gabapentin (NEURONTIN) 600 MG tablet Take 600 mg by mouth as needed.  5   ketoconazole (NIZORAL) 2 % shampoo Apply 1 application topically  once a week.     lisinopril-hydrochlorothiazide (PRINZIDE,ZESTORETIC) 20-12.5 MG tablet Take 1 tablet by mouth in the morning and at bedtime.     montelukast (SINGULAIR) 10 MG tablet Take 10 mg by mouth as needed.     Multiple Vitamin (MULTIVITAMIN WITH MINERALS) TABS tablet Take 1 tablet by mouth daily.     tiotropium (SPIRIVA) 18 MCG inhalation capsule Place 18 mcg into inhaler and inhale every morning.     venlafaxine XR (EFFEXOR-XR) 75 MG 24 hr capsule Take 225 mg by mouth daily with breakfast.     vitamin B-12 (CYANOCOBALAMIN) 1000 MCG tablet Take 1,000 mcg by mouth daily.     VITAMIN D, CHOLECALCIFEROL, PO Take 5,000 Units by mouth daily.     Zinc Sulfate (ZINC 15 PO) Take 15 mg by mouth daily.     acetaminophen (TYLENOL) 325 MG tablet Take 2 tablets (650 mg total) by mouth every 8 (eight) hours as needed for mild pain. 40 tablet 0   ibuprofen (ADVIL) 800 MG tablet Take 1 tablet (800 mg total) by mouth every 8 (eight) hours as needed for mild pain or moderate pain. 30 tablet 0   traMADol (ULTRAM) 50 MG tablet Take 1 tablet (50 mg total) by mouth every 6 (six) hours as needed. 10 tablet 0   traMADol (ULTRAM)  50 MG tablet Take 1 tablet (50 mg total) by mouth every 6 (six) hours as needed. 10 tablet 0   No current facility-administered medications for this visit.      Marland Kitchen  PHYSICAL EXAMINATION: ECOG PERFORMANCE STATUS: 0 - Asymptomatic  Vitals:   08/03/21 1417  BP: 102/64  Pulse: 95  Resp: 16  Temp: 97.9 F (36.6 C)  SpO2: 96%   Filed Weights   08/03/21 1417  Weight: 170 lb (77.1 kg)    Physical Exam Vitals and nursing note reviewed.  HENT:     Head: Normocephalic and atraumatic.     Mouth/Throat:     Pharynx: Oropharynx is clear.  Eyes:     Extraocular Movements: Extraocular movements intact.     Pupils: Pupils are equal, round, and reactive to light.  Cardiovascular:     Rate and Rhythm: Normal rate and regular rhythm.     Heart sounds: Murmur heard.  Pulmonary:     Comments: Decreased breath sounds bilaterally.  Abdominal:     Palpations: Abdomen is soft.  Musculoskeletal:        General: Normal range of motion.     Cervical back: Normal range of motion.  Skin:    General: Skin is warm.  Neurological:     General: No focal deficit present.     Mental Status: She is alert and oriented to person, place, and time.  Psychiatric:        Behavior: Behavior normal.        Judgment: Judgment normal.     LABORATORY DATA:  I have reviewed the data as listed Lab Results  Component Value Date   WBC 7.1 08/03/2021   HGB 15.4 (H) 08/03/2021   HCT 44.6 08/03/2021   MCV 94.7 08/03/2021   PLT 184 08/03/2021   Recent Labs    08/03/21 1504  NA 137  K 3.6  CL 100  CO2 29  GLUCOSE 107*  BUN 24*  CREATININE 0.88  CALCIUM 9.3  GFRNONAA >60  PROT 7.0  ALBUMIN 4.1  AST 30  ALT 42  ALKPHOS 56  BILITOT 0.4    RADIOGRAPHIC STUDIES: I have personally  reviewed the radiological images as listed and agreed with the findings in the report. NM Sentinel Node Inj-No Rpt (Breast)  Result Date: 08/12/2021 Sulfur Colloid was injected by the Nuclear Medicine Technologist  for sentinel lymph node localization.   MM Breast Surgical Specimen  Result Date: 08/24/2021 CLINICAL DATA:  Status post wire localization for reexcision EXAM: SPECIMEN RADIOGRAPH OF THE RIGHT BREAST COMPARISON:  Previous exam(s). FINDINGS: Status post excision of the RIGHT breast. On initial specimen radiograph, the wire is noted. However the clip is not definitively identified on initial specimen radiograph. The wire tip is present. Additional surgical specimens were obtained. The clip is not identified in the surgical specimens. IMPRESSION: Specimen radiograph of the RIGHT breast. The venus clip is not identified in 3 surgical specimens. Findings were discussed with Dr. Rayetta Humphrey in the OR at time of surgery. Patient had a large postsurgical seroma which was evacuated prior to re-excision; it is possible that the clip was evacuated at that time given close proximity of the clip along the posterior and superior margin of the seroma. Seroma fluid could be radiographed to see if the clip was aspirated. Recommend attention on pathology specimen for biopsy-proven invasive malignancy given potential for clip dislodgement. A follow-up mammogram after appropriate healing time could be performed to evaluate for a residual clip. Electronically Signed   By: Valentino Saxon M.D.   On: 08/24/2021 10:28  MM Breast Surgical Specimen  Result Date: 08/12/2021 CLINICAL DATA:  Status post radiofrequency tag localized right breast lumpectomy. EXAM: SPECIMEN RADIOGRAPH OF THE right BREAST COMPARISON:  Previous exam(s). FINDINGS: Status post excision of the right breast. The radiofrequency tag and venous shaped clip are present within the specimen. IMPRESSION: Specimen radiograph of the right breast. Electronically Signed   By: Kristopher Oppenheim M.D.   On: 08/12/2021 11:06  MM RT PLC BREAST LOC DEV   1ST LESION  INC MAMMO GUIDE  Result Date: 08/24/2021 CLINICAL DATA:  Patient is status post lumpectomy with removal of the RIBBON  clip with associated fibroadenoma.VENUS clip was not seen in pathology specimen. EXAM: NEEDLE LOCALIZATION OF THE RIGHT BREAST WITH MAMMO GUIDANCE COMPARISON:  Previous exams. PROCEDURE: Patient presents for needle localization prior to RIGHT breast re-excision. I met with the patient and we discussed the procedure of needle localization including benefits and alternatives. We discussed the high likelihood of a successful procedure. We discussed the risks of the procedure, including infection, bleeding, tissue injury, and further surgery. Informed, written consent was given. The usual time-out protocol was performed immediately prior to the procedure. A diagnostic RIGHT breast CC was obtained to determine whether the VENUS clip remains. VENUS clip is noted at the posterior aspect of a RIGHT breast seroma. An attempt with a 7 cm needle was unsuccessful due to distortion of tissue secondary to large seroma. Using mammographic guidance, sterile technique, 1% lidocaine and a 9 cm modified Kopans needle, the venus clip was localized using a superior approach. The images were marked for Dr. Lysle Pearl. The venus clip is located at the proximal third of the reinforced portion of the needle. IMPRESSION: Needle localization of the RIGHT breast. No apparent complications. Electronically Signed   By: Valentino Saxon M.D.   On: 08/24/2021 08:39  MM RT RADIO FREQUENCY TAG LOC MAMMO GUIDE  Result Date: 08/01/2021 CLINICAL DATA:  Radiofrequency device localization of invasive mammary carcinoma at the 1 o'clock position. EXAM: MAMMOGRAPHIC GUIDED RADIOFREQUENCY DEVICE LOCALIZATION OF THE RIGHT BREAST COMPARISON:  Previous exam(s) FINDINGS: Patient presents for radiofrequency device  localization prior to lumpectomy. I met with the patient and we discussed the procedure of radiofrequency device localization including benefits and alternatives. We discussed the high likelihood of a successful procedure. We discussed the risks of the  procedure including infection, bleeding, tissue injury and further surgery. Informed, written consent was given. The usual time-out protocol was performed immediately prior to the procedure. Using mammographic guidance, sterile technique, 1% lidocaine as local anesthesia, a radiofrequency tag was used to localize invasive mammary carcinoma at the 1 o'clock position posterior depth using a superior approach. The follow-up mammogram images confirm that the RF device is in the expected location and are marked for Dr. Lysle Pearl. Follow-up survey of the patient confirms the presence of the RF device. RF tag [id number] The patient tolerated the procedure well and was released from the [place]. IMPRESSION: Radiofrequency device localization of the RIGHT breast. No apparent complications. Electronically Signed   By: Ileana Roup M.D.   On: 08/01/2021 16:45   ASSESSMENT & PLAN:   Carcinoma of upper-outer quadrant of right breast in female, estrogen receptor positive ([place]) # stage I  ER/PR positive; her2 NEU.   # I had a long discussion with the patient in general regarding the treatment options of breast cancer including-surgery; adjuvant radiation; role of adjuvant systemic therapy including-chemotherapy antihormone therapy. Patient will likely need lumpectomy with sentinel lymph node evaluation; followed by radiation  UNLIKLEY- egarding chemotherapy based on final surgical pathology/gene assay. Patient will benefit from antihormone therapy.    I discussed the potential benefits of each option; and also potential downsides in detail.  Active smoker.   Surgery sep 4th # DISPOSITION: # labs-today cbc/cmp/Pt/PTT- please order # follow up in 3 weeks- MD; no labs- Dr.B  All questions were answered. The patient/family knows to call the clinic with any problems, questions or concerns.    Cammie Sickle, MD 08/31/2021 3:05 PM

## 2021-08-03 NOTE — Assessment & Plan Note (Addendum)
#  clinical stage I  ER/PR positive; her2 NEU-NEGATIVE.  Awaiting surgery.  # I had a long discussion with the patient in general regarding the treatment options of breast cancer including-surgery; adjuvant radiation; role of adjuvant systemic therapy including-chemotherapy antihormone therapy. Patient will likely need lumpectomy with sentinel lymph node evaluation; followed by radiation  UNLIKLEY- egarding chemotherapy based on final surgical pathology/gene assay. Patient will benefit from antihormone therapy.  I discussed the potential benefits of each option; and also potential downsides in detail.  # Thank you Dr.Olemdo for allowing me to participate in the care of your pleasant patient. Please do not hesitate to contact me with questions or concerns in the interim.  Surgery sep 4th # DISPOSITION: # labs-today cbc/cmp/Pt/PTT- please order # follow up in 3 weeks- MD; no labs- Dr.B 

## 2021-08-04 ENCOUNTER — Encounter
Admission: RE | Admit: 2021-08-04 | Discharge: 2021-08-04 | Disposition: A | Discharge status: Home / Self Care | Payer: Medicare Other | Source: Ambulatory Visit | Attending: Surgery | Admitting: Surgery

## 2021-08-04 HISTORY — DX: Prediabetes: R73.03

## 2021-08-04 NOTE — Patient Instructions (Addendum)
Your procedure is scheduled on: 08/12/21 Report to [place]. (Or report to nuclear medicine in the medical mall at scheduled time) To find out your arrival time please call [phone] between 1PM - 3PM on 08/11/21.  Remember: Instructions that are not followed completely may result in serious medical risk, up to and including death, or upon the discretion of your surgeon and anesthesiologist your surgery may need to be rescheduled.     _X__ 1. Do not eat food after midnight the night before your procedure.                 No gum chewing or hard candies. You may drink clear liquids up to 2 hours                 before you are scheduled to arrive for your surgery- DO not drink clear                 liquids within 2 hours of the start of your surgery.                 Clear Liquids include:  water, apple juice without pulp, clear carbohydrate                 drink such as Clearfast or Gatorade, Black Coffee or Tea (Do not add                 anything to coffee or tea). Diabetics water only  __X__2.  On the morning of surgery brush your teeth with toothpaste and water, you                 may rinse your mouth with mouthwash if you wish.  Do not swallow any              toothpaste of mouthwash.     _X__ 3.  No Alcohol for 24 hours before or after surgery.   _X__ 4.  Do Not Smoke or use e-cigarettes For 24 Hours Prior to Your Surgery.                 Do not use any chewable tobacco products for at least 6 hours prior to                 surgery.  ____  5.  Bring all medications with you on the day of surgery if instructed.   __X__  6.  Notify your doctor if there is any change in your medical condition      (cold, fever, infections).     Do not wear jewelry, make-up, hairpins, clips or nail polish. Do not wear lotions, powders, or perfumes. NO DEODORANT Do not shave 48 hours prior to surgery. Men may shave face and neck. Do not bring  valuables to the hospital.    [facility] is not responsible for any belongings or valuables.  Contacts, dentures/partials or body piercings may not be worn into surgery. Bring a case for your contacts, glasses or hearing aids, a denture cup will be supplied. Leave your suitcase in the car. After surgery it may be brought to your room. For patients admitted to the hospital, discharge time is determined by your treatment team.   Patients discharged the day of surgery will not be allowed to drive home.   Please read over the following fact sheets that you were given:   CHG soap  __X__ Take  these medicines the morning of surgery with A SIP OF WATER:    1. amLODipine (NORVASC) 5 MG tablet  2. atorvastatin (LIPITOR) 40 MG tablet  3. dicyclomine (BENTYL) 10 MG capsule  4. gabapentin (NEURONTIN) 600 MG tablet  5. venlafaxine XR (EFFEXOR-XR) 225 MG 24 hr capsule  6.  ____ Fleet Enema (as directed)   __X__ Use CHG Soap/SAGE wipes as directed  __X__ Use inhalers on the day of surgery SPIRIVA AND ALBUTEROL  ____ Stop metformin/Janumet/Farxiga 2 days prior to surgery    ____ Take 1/2 of usual insulin dose the night before surgery. No insulin the morning          of surgery.   ____ Stop Blood Thinners Coumadin/Plavix/Xarelto/Pleta/Pradaxa/Eliquis/Effient/Aspirin  on   Or contact your Surgeon, Cardiologist or Medical Doctor regarding  ability to stop your blood thinners  __X__ Stop Anti-inflammatories 7 days before surgery such as Advil, Ibuprofen, Motrin,  BC or Goodies Powder, Naprosyn, Naproxen, Aleve, Aspirin    __X__ Stop all herbal and vitamin supplements, fish oil or vitamin E until after surgery 7 days before surgery   ____ Bring C-Pap to the hospital.

## 2021-08-05 ENCOUNTER — Other Ambulatory Visit
Admission: RE | Admit: 2021-08-05 | Discharge: 2021-08-05 | Disposition: A | Discharge status: Home / Self Care | Payer: Medicare Other | Source: Ambulatory Visit | Attending: Surgery | Admitting: Surgery

## 2021-08-05 ENCOUNTER — Other Ambulatory Visit: Payer: Self-pay

## 2021-08-05 DIAGNOSIS — Z01818 Encounter for other preprocedural examination: Secondary | ICD-10-CM | POA: Diagnosis present

## 2021-08-05 DIAGNOSIS — Z01810 Encounter for preprocedural cardiovascular examination: Secondary | ICD-10-CM

## 2021-08-11 ENCOUNTER — Other Ambulatory Visit: Payer: Self-pay

## 2021-08-11 ENCOUNTER — Other Ambulatory Visit: Payer: Medicare Other

## 2021-08-11 ENCOUNTER — Ambulatory Visit
Admission: RE | Admit: 2021-08-11 | Discharge: 2021-08-11 | Disposition: A | Discharge status: Home / Self Care | Payer: Medicare Other | Source: Ambulatory Visit | Attending: Surgery | Admitting: Surgery

## 2021-08-11 DIAGNOSIS — C50211 Malignant neoplasm of upper-inner quadrant of right female breast: Secondary | ICD-10-CM

## 2021-08-12 ENCOUNTER — Ambulatory Visit: Payer: Medicare Other | Admitting: Registered Nurse

## 2021-08-12 ENCOUNTER — Encounter
Admission: RE | Disposition: A | Discharge status: Home / Self Care | Payer: Self-pay | Source: Home / Self Care | Attending: Surgery

## 2021-08-12 ENCOUNTER — Ambulatory Visit
Admission: RE | Admit: 2021-08-12 | Discharge: 2021-08-12 | Disposition: A | Discharge status: Home / Self Care | Payer: Medicare Other | Attending: Surgery | Admitting: Surgery

## 2021-08-12 ENCOUNTER — Other Ambulatory Visit: Payer: Self-pay

## 2021-08-12 ENCOUNTER — Ambulatory Visit
Admission: RE | Admit: 2021-08-12 | Discharge: 2021-08-12 | Disposition: A | Discharge status: Home / Self Care | Payer: Medicare Other | Source: Ambulatory Visit | Attending: Surgery | Admitting: Surgery

## 2021-08-12 ENCOUNTER — Encounter: Payer: Self-pay | Admitting: Surgery

## 2021-08-12 DIAGNOSIS — Z880 Allergy status to penicillin: Secondary | ICD-10-CM | POA: Diagnosis not present

## 2021-08-12 DIAGNOSIS — J449 Chronic obstructive pulmonary disease, unspecified: Secondary | ICD-10-CM | POA: Insufficient documentation

## 2021-08-12 DIAGNOSIS — Z91048 Other nonmedicinal substance allergy status: Secondary | ICD-10-CM | POA: Diagnosis not present

## 2021-08-12 DIAGNOSIS — M1990 Unspecified osteoarthritis, unspecified site: Secondary | ICD-10-CM | POA: Diagnosis not present

## 2021-08-12 DIAGNOSIS — Z881 Allergy status to other antibiotic agents status: Secondary | ICD-10-CM | POA: Diagnosis not present

## 2021-08-12 DIAGNOSIS — K589 Irritable bowel syndrome without diarrhea: Secondary | ICD-10-CM | POA: Insufficient documentation

## 2021-08-12 DIAGNOSIS — Z885 Allergy status to narcotic agent status: Secondary | ICD-10-CM | POA: Diagnosis not present

## 2021-08-12 DIAGNOSIS — D241 Benign neoplasm of right breast: Secondary | ICD-10-CM | POA: Diagnosis not present

## 2021-08-12 DIAGNOSIS — Z833 Family history of diabetes mellitus: Secondary | ICD-10-CM | POA: Diagnosis not present

## 2021-08-12 DIAGNOSIS — Z820 Family history of epilepsy and other diseases of the nervous system: Secondary | ICD-10-CM | POA: Diagnosis not present

## 2021-08-12 DIAGNOSIS — R7303 Prediabetes: Secondary | ICD-10-CM | POA: Insufficient documentation

## 2021-08-12 DIAGNOSIS — Z79899 Other long term (current) drug therapy: Secondary | ICD-10-CM | POA: Diagnosis not present

## 2021-08-12 DIAGNOSIS — Z7951 Long term (current) use of inhaled steroids: Secondary | ICD-10-CM | POA: Insufficient documentation

## 2021-08-12 DIAGNOSIS — Z7982 Long term (current) use of aspirin: Secondary | ICD-10-CM | POA: Insufficient documentation

## 2021-08-12 DIAGNOSIS — E785 Hyperlipidemia, unspecified: Secondary | ICD-10-CM | POA: Insufficient documentation

## 2021-08-12 DIAGNOSIS — Z8349 Family history of other endocrine, nutritional and metabolic diseases: Secondary | ICD-10-CM | POA: Diagnosis not present

## 2021-08-12 DIAGNOSIS — F17210 Nicotine dependence, cigarettes, uncomplicated: Secondary | ICD-10-CM | POA: Diagnosis not present

## 2021-08-12 DIAGNOSIS — Z170 Estrogen receptor positive status [ER+]: Secondary | ICD-10-CM

## 2021-08-12 DIAGNOSIS — R569 Unspecified convulsions: Secondary | ICD-10-CM | POA: Insufficient documentation

## 2021-08-12 DIAGNOSIS — Z8249 Family history of ischemic heart disease and other diseases of the circulatory system: Secondary | ICD-10-CM | POA: Insufficient documentation

## 2021-08-12 DIAGNOSIS — C50411 Malignant neoplasm of upper-outer quadrant of right female breast: Secondary | ICD-10-CM

## 2021-08-12 DIAGNOSIS — Z888 Allergy status to other drugs, medicaments and biological substances status: Secondary | ICD-10-CM | POA: Diagnosis not present

## 2021-08-12 DIAGNOSIS — I10 Essential (primary) hypertension: Secondary | ICD-10-CM | POA: Diagnosis not present

## 2021-08-12 DIAGNOSIS — C50211 Malignant neoplasm of upper-inner quadrant of right female breast: Secondary | ICD-10-CM | POA: Insufficient documentation

## 2021-08-12 HISTORY — PX: BREAST LUMPECTOMY: SHX2

## 2021-08-12 HISTORY — PX: PART MASTECTOMY,RADIO FREQUENCY LOCALIZER,AXILLARY SENTINEL NODE BIOPSY: SHX6901

## 2021-08-12 SURGERY — PART MASTECTOMY,RADIO FREQUENCY LOCALIZER,AXILLARY SENTINEL NODE BIOPSY
Anesthesia: General | Laterality: Right

## 2021-08-12 MED ORDER — TRAMADOL HCL 50 MG PO TABS: 50.0000 mg | ORAL_TABLET | Freq: Four times a day (QID) | ORAL | 0 refills | Status: AC | PRN

## 2021-08-12 MED ORDER — IBUPROFEN 800 MG PO TABS: 800.0000 mg | ORAL_TABLET | Freq: Three times a day (TID) | ORAL | 0 refills | Status: DC | PRN

## 2021-08-12 MED ORDER — FENTANYL CITRATE (PF) 100 MCG/2ML IJ SOLN
INTRAMUSCULAR | Status: DC | PRN
  Administered 2021-08-12 (×4): 25 ug via INTRAVENOUS

## 2021-08-12 MED ORDER — LIDOCAINE HCL (CARDIAC) PF 100 MG/5ML IV SOSY
PREFILLED_SYRINGE | INTRAVENOUS | Status: DC | PRN
  Administered 2021-08-12: 100 mg via INTRAVENOUS

## 2021-08-12 MED ORDER — FENTANYL CITRATE (PF) 100 MCG/2ML IJ SOLN: 25.0000 ug | INTRAMUSCULAR | Status: DC | PRN

## 2021-08-12 MED ORDER — CHLORHEXIDINE GLUCONATE 0.12 % MT SOLN: 15.0000 mL | Freq: Once | OROMUCOSAL | Status: AC

## 2021-08-12 MED ORDER — ACETAMINOPHEN 325 MG PO TABS: 650.0000 mg | ORAL_TABLET | Freq: Three times a day (TID) | ORAL | 0 refills | Status: AC | PRN

## 2021-08-12 MED ORDER — PROPOFOL 10 MG/ML IV BOLUS
INTRAVENOUS | Status: DC | PRN
  Administered 2021-08-12: 120 mg via INTRAVENOUS
  Administered 2021-08-12: 20 mg via INTRAVENOUS

## 2021-08-12 MED ORDER — CHLORHEXIDINE GLUCONATE 0.12 % MT SOLN
OROMUCOSAL | Status: AC
  Administered 2021-08-12: 15 mL via OROMUCOSAL
  Filled 2021-08-12: qty 15

## 2021-08-12 MED ORDER — ONDANSETRON HCL 4 MG/2ML IJ SOLN
INTRAMUSCULAR | Status: AC
  Filled 2021-08-12: qty 2

## 2021-08-12 MED ORDER — EPHEDRINE SULFATE 50 MG/ML IJ SOLN
INTRAMUSCULAR | Status: DC | PRN
  Administered 2021-08-12: 5 mg via INTRAVENOUS
  Administered 2021-08-12: 10 mg via INTRAVENOUS
  Administered 2021-08-12 (×4): 5 mg via INTRAVENOUS

## 2021-08-12 MED ORDER — FAMOTIDINE 20 MG PO TABS: 20.0000 mg | ORAL_TABLET | Freq: Once | ORAL | Status: AC

## 2021-08-12 MED ORDER — LIDOCAINE HCL 1 % IJ SOLN
INTRAMUSCULAR | Status: DC | PRN
  Administered 2021-08-12: 10 mL

## 2021-08-12 MED ORDER — ORAL CARE MOUTH RINSE: 15.0000 mL | Freq: Once | OROMUCOSAL | Status: AC

## 2021-08-12 MED ORDER — DOCUSATE SODIUM 100 MG PO CAPS: 100.0000 mg | ORAL_CAPSULE | Freq: Two times a day (BID) | ORAL | 0 refills | Status: AC | PRN

## 2021-08-12 MED ORDER — ACETAMINOPHEN 10 MG/ML IV SOLN
INTRAVENOUS | Status: DC | PRN
  Administered 2021-08-12: 1000 mg via INTRAVENOUS

## 2021-08-12 MED ORDER — TECHNETIUM TC 99M TILMANOCEPT KIT
1.0000 | PACK | Freq: Once | INTRAVENOUS | Status: AC
  Administered 2021-08-12: 1.17 via INTRADERMAL

## 2021-08-12 MED ORDER — EPHEDRINE 5 MG/ML INJ
INTRAVENOUS | Status: AC
  Filled 2021-08-12: qty 5

## 2021-08-12 MED ORDER — PHENYLEPHRINE HCL (PRESSORS) 10 MG/ML IV SOLN
INTRAVENOUS | Status: DC | PRN
  Administered 2021-08-12 (×4): 100 ug via INTRAVENOUS
  Administered 2021-08-12: 150 ug via INTRAVENOUS
  Administered 2021-08-12: 50 ug via INTRAVENOUS
  Administered 2021-08-12: 100 ug via INTRAVENOUS
  Administered 2021-08-12: 50 ug via INTRAVENOUS
  Administered 2021-08-12 (×2): 100 ug via INTRAVENOUS
  Administered 2021-08-12: 50 ug via INTRAVENOUS

## 2021-08-12 MED ORDER — FAMOTIDINE 20 MG PO TABS
ORAL_TABLET | ORAL | Status: AC
  Administered 2021-08-12: 20 mg via ORAL
  Filled 2021-08-12: qty 1

## 2021-08-12 MED ORDER — CEFAZOLIN SODIUM-DEXTROSE 2-4 GM/100ML-% IV SOLN
2.0000 g | INTRAVENOUS | Status: AC
  Administered 2021-08-12: 2 g via INTRAVENOUS

## 2021-08-12 MED ORDER — BUPIVACAINE-EPINEPHRINE 0.5% -1:200000 IJ SOLN
INTRAMUSCULAR | Status: DC | PRN
  Administered 2021-08-12: 10 mL

## 2021-08-12 MED ORDER — CHLORHEXIDINE GLUCONATE CLOTH 2 % EX PADS: 6.0000 | MEDICATED_PAD | Freq: Once | CUTANEOUS | Status: DC

## 2021-08-12 MED ORDER — ACETAMINOPHEN 10 MG/ML IV SOLN
INTRAVENOUS | Status: AC
  Filled 2021-08-12: qty 100

## 2021-08-12 MED ORDER — DEXAMETHASONE SODIUM PHOSPHATE 10 MG/ML IJ SOLN
INTRAMUSCULAR | Status: DC | PRN
  Administered 2021-08-12: 6 mg via INTRAVENOUS

## 2021-08-12 MED ORDER — DEXAMETHASONE SODIUM PHOSPHATE 10 MG/ML IJ SOLN
INTRAMUSCULAR | Status: AC
  Filled 2021-08-12: qty 1

## 2021-08-12 MED ORDER — SEVOFLURANE IN SOLN
RESPIRATORY_TRACT | Status: AC
  Filled 2021-08-12: qty 250

## 2021-08-12 MED ORDER — LACTATED RINGERS IV SOLN: INTRAVENOUS | Status: DC

## 2021-08-12 MED ORDER — LIDOCAINE HCL (PF) 2 % IJ SOLN
INTRAMUSCULAR | Status: AC
  Filled 2021-08-12: qty 5

## 2021-08-12 MED ORDER — ONDANSETRON HCL 4 MG/2ML IJ SOLN
INTRAMUSCULAR | Status: DC | PRN
  Administered 2021-08-12: 4 mg via INTRAVENOUS

## 2021-08-12 MED ORDER — FENTANYL CITRATE (PF) 100 MCG/2ML IJ SOLN
INTRAMUSCULAR | Status: AC
  Filled 2021-08-12: qty 2

## 2021-08-12 MED ORDER — CEFAZOLIN SODIUM-DEXTROSE 2-4 GM/100ML-% IV SOLN
INTRAVENOUS | Status: AC
  Filled 2021-08-12: qty 100

## 2021-08-12 SURGICAL SUPPLY — 44 items
APPLIER CLIP 11 MED OPEN (CLIP)
BLADE SURG 15 STRL LF DISP TIS (BLADE) ×1 IMPLANT
BLADE SURG 15 STRL SS (BLADE) ×1
CHLORAPREP W/TINT 26 (MISCELLANEOUS) ×2 IMPLANT
CLIP APPLIE 11 MED OPEN (CLIP) IMPLANT
CNTNR SPEC 2.5X3XGRAD LEK (MISCELLANEOUS)
CONT SPEC 4OZ STER OR WHT (MISCELLANEOUS)
CONTAINER SPEC 2.5X3XGRAD LEK (MISCELLANEOUS) IMPLANT
DERMABOND ADVANCED (GAUZE/BANDAGES/DRESSINGS) ×1
DERMABOND ADVANCED .7 DNX12 (GAUZE/BANDAGES/DRESSINGS) ×1 IMPLANT
DEVICE DSSCT PLSMBLD 3.0S LGHT (MISCELLANEOUS) ×1 IMPLANT
DEVICE DUBIN SPECIMEN MAMMOGRA (MISCELLANEOUS) ×2 IMPLANT
DRAPE LAPAROTOMY 77X122 PED (DRAPES) ×2 IMPLANT
ELECT CAUTERY BLADE TIP 2.5 (TIP) ×2
ELECT REM PT RETURN 9FT ADLT (ELECTROSURGICAL) ×2
ELECTRODE CAUTERY BLDE TIP 2.5 (TIP) ×1 IMPLANT
ELECTRODE REM PT RTRN 9FT ADLT (ELECTROSURGICAL) ×1 IMPLANT
GAUZE 4X4 16PLY ~~LOC~~+RFID DBL (SPONGE) ×2 IMPLANT
GLOVE SURG SYN 6.5 ES PF (GLOVE) ×4 IMPLANT
GLOVE SURG UNDER POLY LF SZ7 (GLOVE) ×4 IMPLANT
GOWN STRL REUS W/ TWL LRG LVL3 (GOWN DISPOSABLE) ×2 IMPLANT
GOWN STRL REUS W/TWL LRG LVL3 (GOWN DISPOSABLE) ×2
KIT MARKER MARGIN INK (KITS) ×2 IMPLANT
KIT TURNOVER KIT A (KITS) ×2 IMPLANT
LABEL OR SOLS (LABEL) ×2 IMPLANT
LIGHT WAVEGUIDE WIDE FLAT (MISCELLANEOUS) IMPLANT
MANIFOLD NEPTUNE II (INSTRUMENTS) ×2 IMPLANT
MARKER MARGIN CORRECT CLIP (MARKER) ×2 IMPLANT
NEEDLE HYPO 22GX1.5 SAFETY (NEEDLE) ×4 IMPLANT
PACK BASIN MINOR ARMC (MISCELLANEOUS) ×2 IMPLANT
PLASMABLADE 3.0S W/LIGHT (MISCELLANEOUS) ×2
SET LOCALIZER 20 PROBE US (MISCELLANEOUS) ×2 IMPLANT
SUT MNCRL 4-0 (SUTURE) ×2
SUT MNCRL 4-0 27XMFL (SUTURE) ×2
SUT SILK 3 0 12 30 (SUTURE) IMPLANT
SUT VIC AB 3-0 SH 27 (SUTURE) ×2
SUT VIC AB 3-0 SH 27X BRD (SUTURE) ×2 IMPLANT
SUTURE MNCRL 4-0 27XMF (SUTURE) ×2 IMPLANT
SYR 20ML LL LF (SYRINGE) ×2 IMPLANT
SYR BULB IRRIG 60ML STRL (SYRINGE) ×2 IMPLANT
TRAP NEPTUNE SPECIMEN COLLECT (MISCELLANEOUS) ×2 IMPLANT
TUBING CONNECTING 10 (TUBING) ×2 IMPLANT
WATER STERILE IRR 1000ML POUR (IV SOLUTION) ×2 IMPLANT
WATER STERILE IRR 500ML POUR (IV SOLUTION) ×2 IMPLANT

## 2021-08-12 NOTE — Anesthesia Procedure Notes (Signed)
Procedure Name: LMA Insertion Date/Time: 08/12/2021 10:30 AM Performed by: Lia Foyer, CRNA Pre-anesthesia Checklist: Patient identified, Emergency Drugs available, Suction available and Patient being monitored Patient Re-evaluated:Patient Re-evaluated prior to induction Oxygen Delivery Method: Circle system utilized Preoxygenation: Pre-oxygenation with 100% oxygen Induction Type: IV induction Ventilation: Mask ventilation without difficulty LMA: LMA inserted LMA Size: 4.0 Tube type: Oral Number of attempts: 1 Placement Confirmation: positive ETCO2 and breath sounds checked- equal and bilateral Tube secured with: Tape Dental Injury: Teeth and Oropharynx as per pre-operative assessment

## 2021-08-12 NOTE — Transfer of Care (Signed)
Immediate Anesthesia Transfer of Care Note  Patient: Barbara Contreras  Procedure(s) Performed: PART [place] NODE BIOPSY (Right)  Patient Location: PACU  Anesthesia Type:General  Level of Consciousness: drowsy and patient cooperative  Airway & Oxygen Therapy: Patient Spontanous Breathing and Patient connected to face mask oxygen  Post-op Assessment: Report given to RN and Post -op Vital signs reviewed and stable  Post vital signs: Reviewed and stable  Last Vitals:  Vitals Value Taken Time  BP 91/72 08/12/21 1218  Temp    Pulse 79 08/12/21 1220  Resp 13 08/12/21 1220  SpO2 97 % 08/12/21 1220  Vitals shown include unvalidated device data.  Last Pain:  Vitals:   08/12/21 1009  TempSrc: Temporal  PainSc: 0-No pain         Complications: No notable events documented.

## 2021-08-12 NOTE — Op Note (Signed)
Preoperative diagnosis: Right breast carcinoma.  Postoperative diagnosis: Right.   Procedure: RF tag-localized right breast partial mastectomy.                      Right axillary Sentinel Lymph node biopsy  Anesthesia: GETA  Surgeon: Dr. Benjamine Sprague  Wound Classification: Clean  Indications: Patient is a 70 y.o. female with a nonpalpable right breast mass noted on mammography with core biopsy demonstrating CA, requires RF localizer placement, partial mastectomy for treatment with sentinel lymph node biopsy.   Specimen: Right breast mass, Sentinel Lymph nodes x 3, anterior margin  Complications: None  Estimated Blood Loss: 30 mL  Findings: 1. Specimen mammography shows marker and RF localizer on specimen 2. Pathology call refers gross examination of margins was positive for changes on the anterior margin 3. No other palpable mass or lymph node identified.   Operation performed with curative intent:Yes  Tracer(s) used to identify sentinel nodes in the upfront surgery (non-neoadjuvant) setting (select all that apply):Radioactive Tracer  Tracer(s) used to identify sentinel nodes in the neoadjuvant setting (select all that apply):N/A  All nodes (colored or non-colored) present at the end of a dye-filled lymphatic channel were removed:N/A  All significantly radioactive nodes were removed:Yes  All palpable suspicious nodes were removed:Yes  Biopsy-proven positive nodes marked with clips prior to chemotherapy were identified and removed:N/A    Description of procedure: RF localization was performed by radiology prior to procedure. In the nuclear medicine suite, the subareolar region was injected with Tc-99 sulfur colloid the morning of procedure. Localization studies were reviewed. The patient was taken to the operating room and placed supine on the operating table, and after general anesthesia the right breast and axilla were prepped and draped in the usual sterile fashion. A  time-out was completed verifying correct patient, procedure, site, positioning, and implant(s) and/or special equipment prior to beginning this procedure.  By identifying the RF localizer, the probable trajectory and location of the mass was visualized. A skin incision was planned in such a way as to minimize the amount of dissection to reach the mass.  The skin incision was made after infusion of local. Flaps were raised and  Sharp and blunt dissection was then taken down to the mass, taking care to include the entire RF localizer and a margin of grossly normal tissue.  Of note, the RF localizer did fall out of the specimen during removal from what seemed like the posterior aspect.  Inspection of the excisional cavity noted the excised tissue was on the chest wall, and no further breast tissue was noted to be present in the posterior margin of the specimen within the cavity.  The specimen was removed. The specimen was oriented with paint and sent to radiology with the localization studies.  Callback noted some tissue changes noted in the anterior aspect, additional margin was removed at this point, sent to pathology as well.  Confirmation received that there is no tissue changes noted at the posterior aspect.  The specimen only contains 1 biopsy clip which was adjacent to the RF tag.    A hand-held gamma probe was used to identify the location of the hottest spot in the axilla. An incision was made around the caudal axillary hairline. Sharp and blunt Dissection was carried down to subdermal facias. The probe was placed within wound and again, the point of maximal count was found. Dissection continue until nodule was identified. The probe was placed in contact with the node  and 15K counts were recorded. The node was excised in its entirety. Ex vivo, the node measured 13K counts when placed on the probe. The bed of the node measured 100 counts. Two additional hot spots were detected and the node was excised in  similar fashion. No additional hot spots were identified. No clinically abnormal nodes were palpated. Both wounds irrigated, hemostasis was achieved and the wound closed in layers with  interrupted sutures of 3-0 Vicryl in deep dermal layer and a running subcuticular suture of Monocryl 4-0, then dressed with dermabond. The patient tolerated the procedure well and was taken to the postanesthesia care unit in stable condition. Sponge and instrument count correct at end of procedure.

## 2021-08-12 NOTE — H&P (Signed)
No change from previous H&P

## 2021-08-12 NOTE — Anesthesia Preprocedure Evaluation (Signed)
Anesthesia Evaluation  Patient identified by MRN, date of birth, ID band Patient awake    Reviewed: Allergy & Precautions, NPO status , Patient's Chart, lab work & pertinent test results  History of Anesthesia Complications Negative for: history of anesthetic complications  Airway Mallampati: III  TM Distance: <3 FB Neck ROM: limited    Dental  (+) Poor Dentition, Missing, Chipped   Pulmonary asthma , COPD, Current Smoker,    Pulmonary exam normal        Cardiovascular Exercise Tolerance: Good hypertension, (-) angina+ CAD  Normal cardiovascular exam     Neuro/Psych Seizures -,  PSYCHIATRIC DISORDERS  Neuromuscular disease negative psych ROS   GI/Hepatic negative GI ROS, Neg liver ROS, neg GERD  ,  Endo/Other  negative endocrine ROS  Renal/GU      Musculoskeletal  (+) Arthritis ,   Abdominal   Peds  Hematology negative hematology ROS (+)   Anesthesia Other Findings Past Medical History: No date: Arthritis No date: Asthma No date: Back pain No date: Carotid artery stenosis No date: Carotid stenosis No date: Coronary artery disease No date: Depression No date: Emphysema of lung (HCC) No date: Heart murmur No date: Hip pain No date: History of Lyme disease No date: HLD (hyperlipidemia) No date: Hyperlipemia No date: Hypertension No date: IBS (irritable bowel syndrome) No date: IBS (irritable bowel syndrome) No date: Lyme disease No date: Pre-diabetes No date: Sciatica No date: Sciatica No date: Seizures (HCC) No date: Tremor No date: Vitamin D deficiency No date: Vitamin D deficiency  Past Surgical History: No date: ABDOMINAL HYSTERECTOMY 07/22/2021: BREAST BIOPSY; Right     Comment:  [place] bx 1:00 area, venus marker, IMC tubular features 07/22/2021: BREAST BIOPSY; Right     Comment:  [place] bx 2:00 ribbon marker, fibroadenoma No date: CAROTID ENDARTERECTOMY; Right No date:  CHOLECYSTECTOMY 09/13/2018: COLONOSCOPY WITH PROPOFOL; N/A     Comment:  Procedure: COLONOSCOPY WITH PROPOFOL;  Surgeon:               Lollie Sails, MD;  Location: [facility] ENDOSCOPY;                Service: Endoscopy;  Laterality: N/A; 05/03/2021: ESOPHAGOGASTRODUODENOSCOPY; N/A     Comment:  Procedure: ESOPHAGOGASTRODUODENOSCOPY (EGD);  Surgeon:               Lesly Rubenstein, MD;  Location: [facility] ENDOSCOPY;                Service: Endoscopy;  Laterality: N/A; No date: paranasal sinusotomy     Reproductive/Obstetrics negative OB ROS                             Anesthesia Physical Anesthesia Plan  ASA: 3  Anesthesia Plan: General LMA   Post-op Pain Management:    Induction: Intravenous  PONV Risk Score and Plan: Dexamethasone, Ondansetron, Midazolam and Treatment may vary due to age or medical condition  Airway Management Planned: LMA  Additional Equipment:   Intra-op Plan:   Post-operative Plan: Extubation in OR  Informed Consent: I have reviewed the patients History and Physical, chart, labs and discussed the procedure including the risks, benefits and alternatives for the proposed anesthesia with the patient or authorized representative who has indicated his/her understanding and acceptance.     Dental Advisory Given  Plan Discussed with: Anesthesiologist, CRNA and Surgeon  Anesthesia Plan Comments: (Patient consented for risks of anesthesia including but not limited  to:  - adverse reactions to medications - damage to eyes, teeth, lips or other oral mucosa - nerve damage due to positioning  - sore throat or hoarseness - Damage to heart, brain, nerves, lungs, other parts of body or loss of life  Patient voiced understanding.)        Anesthesia Quick Evaluation

## 2021-08-12 NOTE — Discharge Instructions (Addendum)
Removal, Care After This sheet gives you information about how to care for yourself after your procedure. Your health care provider may also give you more specific instructions. If you have problems or questions, contact your health care provider. What can I expect after the procedure? After the procedure, it is common to have: Soreness. Bruising. Itching. Follow these instructions at home: site care Follow instructions from your health care provider about how to take care of your site. Make sure you: Wash your hands with soap and water before and after you change your bandage (dressing). If soap and water are not available, use hand sanitizer. Leave stitches (sutures), skin glue, or adhesive strips in place. These skin closures may need to stay in place for 2 weeks or longer. If adhesive strip edges start to loosen and curl up, you may trim the loose edges. Do not remove adhesive strips completely unless your health care provider tells you to do that. If the area bleeds or bruises, apply gentle pressure for 10 minutes. OK TO SHOWER IN 24HRS  Check your site every day for signs of infection. Check for: Redness, swelling, or pain. Fluid or blood. Warmth. Pus or a bad smell.  General instructions RESUME ASPIRIN IN 48HRS Rest and then return to your normal activities as told by your health care provider.  tylenol and advil as needed for discomfort.  Please alternate between the two every four hours as needed for pain.    Use narcotics, if prescribed, only when tylenol and motrin is not enough to control pain.  325-'650mg'$  every 8hrs to max of '3000mg'$ /24hrs (including the '325mg'$  in every norco dose) for the tylenol.    Advil up to '800mg'$  per dose every 8hrs as needed for pain.   Keep all follow-up visits as told by your health care provider. This is important. Contact a health care provider if: You have redness, swelling, or pain around your site. You have fluid or blood coming from your  site. Your site feels warm to the touch. You have pus or a bad smell coming from your site. You have a fever. Your sutures, skin glue, or adhesive strips loosen or come off sooner than expected. Get help right away if: You have bleeding that does not stop with pressure or a dressing. Summary After the procedure, it is common to have some soreness, bruising, and itching at the site. Follow instructions from your health care provider about how to take care of your site. Check your site every day for signs of infection. Contact a health care provider if you have redness, swelling, or pain around your site, or your site feels warm to the touch. Keep all follow-up visits as told by your health care provider. This is important. This information is not intended to replace advice given to you by your health care provider. Make sure you discuss any questions you have with your health care provider. Document Released: 12/17/2015 Document Revised: 05/20/2018 Document Reviewed: 05/20/2018 Elsevier Interactive Patient Education  2019 [employer]   The drugs that you were given will stay in your system until tomorrow so for the next 24 hours you should not:  Drive an automobile Make any legal decisions Drink any alcoholic beverage   You may resume regular meals tomorrow.  Today it is better to start with liquids and gradually work up to solid foods.  You may eat anything you prefer, but it is better to start with liquids, then soup  and crackers, and gradually work up to solid foods.   Please notify your doctor immediately if you have any unusual bleeding, trouble breathing, redness and pain at the surgery site, drainage, fever, or pain not relieved by medication.     Your post-operative visit with Dr.                                       is: Date:                        Time:    Please call to schedule your post-operative  visit.  Additional Instructions:

## 2021-08-15 NOTE — Anesthesia Postprocedure Evaluation (Signed)
Anesthesia Post Note  Patient: CLOEE HONEGGER  Procedure(s) Performed: PART MASTECTOMY,RADIO FREQUENCY LOCALIZER,AXILLARY SENTINEL NODE BIOPSY (Right)  Patient location during evaluation: PACU Anesthesia Type: General Level of consciousness: awake and alert Pain management: pain level controlled Vital Signs Assessment: post-procedure vital signs reviewed and stable Respiratory status: spontaneous breathing, nonlabored ventilation, respiratory function stable and patient connected to nasal cannula oxygen Cardiovascular status: blood pressure returned to baseline and stable Postop Assessment: no apparent nausea or vomiting Anesthetic complications: no   No notable events documented.   Last Vitals:  Vitals:   08/12/21 1259 08/12/21 1320  BP: (!) 129/58 (!) 130/50  Pulse: 66 75  Resp: 16 16  Temp: (!) 36 C   SpO2: 93% 93%    Last Pain:  Vitals:   08/12/21 1259  TempSrc: Temporal  PainSc: 0-No pain                 Precious Haws Vessie Olmsted

## 2021-08-16 LAB — SURGICAL PATHOLOGY

## 2021-08-18 ENCOUNTER — Other Ambulatory Visit: Payer: Self-pay | Admitting: Surgery

## 2021-08-18 ENCOUNTER — Ambulatory Visit: Payer: Self-pay | Admitting: Surgery

## 2021-08-18 DIAGNOSIS — C50211 Malignant neoplasm of upper-inner quadrant of right female breast: Secondary | ICD-10-CM

## 2021-08-18 NOTE — H&P (Signed)
Subjective:    CC: Malignant neoplasm of upper-inner quadrant of right female breast, unspecified estrogen receptor status (CMS-HCC) [C50.211] HPI:  Barbara Contreras is a 70 y.o. female who returns for above.  Path unfortunately showed no evidence of malignancy in excised tissue, so recommended re-excision.  PMH: Arthritis, Asthma without status asthmaticus, unspecified, COPD (chronic obstructive pulmonary disease) (CMS-HCC), Depression, carotid artery stenosis, Lyme disease, Hyperlipidemia, Hypertension, Irritable bowel syndrome with diarrhea, Sciatica, Seizures (CMS-HCC), Tremor, and Vitamin D deficiency.   Past Surgical History:  has a past surgical history that includes Carotid endarterectomy (Right, 2014); Hysterectomy (2000); Cholecystectomy (2016); Paranasal Sinusotomy (1986); Colonoscopy (09/13/2018); Oophorectomy; and egd (05/03/2021).   Family History: family history includes Alcohol abuse in her father and mother; Alzheimer's disease in her father; Anxiety in her father and sister; Bipolar disorder in her sister; Coronary Artery Disease (Blocked arteries around heart) in her mother; Depression in her father and sister; Diabetes type II in her mother and sister; Glaucoma in her mother; High blood pressure (Hypertension) in her mother and sister; Hyperlipidemia (Elevated cholesterol) in her mother; Obesity in her mother; Thyroid disease in her mother and sister.   Social History:  reports that she has been smoking cigarettes. She has been smoking about 0.50 packs per day. She has never used smokeless tobacco. She reports current alcohol use. She reports current drug use. Drug: Marijuana.   Current Medications: has a current medication list which includes the following prescription(s): albuterol, amlodipine, aspirin, atorvastatin, baclofen, cholecalciferol, clobetasol, dicyclomine, fluticasone propionate, gabapentin, ketoconazole, lisinopril-hydrochlorothiazide, montelukast, multivitamin with  iron-minerals, naproxen, omeprazole, pantoprazole, tiotropium, venlafaxine, zinc oxide, and ipratropium.   Allergies:       Allergies as of 07/27/2021 - Reviewed 07/27/2021  Allergen Reaction Noted   Morphine Itching and Hallucination 07/08/2015   Penicillin Unknown 07/08/2015   Adhesive tape-silicones Other (See Comments) 06/11/2018   Clindamycin Muscle Pain 08/15/2016   Phenergan vc [promethazine-phenylephrine] Other (See Comments) 08/15/2016      ROS:  A 15 point review of systems was performed and was negative except as noted in HPI   Objective:      BP 130/79   Pulse 82   Ht 162.6 cm ('5\' 4"'$ )   Wt 77.6 kg (171 lb)   BMI 29.35 kg/m    Constitutional :  alert, appears stated age, cooperative and no distress  Lymphatics/Throat:  no asymmetry, masses, or scars  Respiratory:  clear to auscultation bilaterally  Cardiovascular:  regular rate and rhythm, S1, S2 normal, no murmur, click, rub or gallop and regular rate and rhythm  Gastrointestinal: soft, non-tender; bowel sounds normal; no masses,  no organomegaly.   Musculoskeletal: Steady gait and movement  Skin: Cool and moist  Psychiatric: Normal affect, non-agitated, not confused  Breast:  Chaperone present for exam.  breasts appear normal, no suspicious masses, no skin or nipple changes or axillary nodes.    LABS:  SURGICAL PATHOLOGY SURGICAL PATHOLOGY  CASE: [id number]  PATIENT: [facility]  Surgical Pathology Report      Specimen Submitted:  A. Breast, right  B. Breast, right  C. Sentinel lymph node 1, right breast  D. Sentinel lymph node 2, right breast  E. Sentinel lymph node 3, right breast   Clinical History: Malignant neoplasm of upper-inner quadrant of right  female breast, unspecified estrogen receptor status CMS-HCC C50.211     DIAGNOSIS:  A. BREAST, RIGHT; PARTIAL MASTECTOMY:  - BENIGN MAMMARY PARENCHYMA WITH FIBROADENOMA AND INCIDENTAL LOBULAR  NEOPLASIA.  - SINGLE CLIP AND BIOPSY SITE  IDENTIFIED.  - NO EVIDENCE OF DCIS OR RESIDUAL INVASIVE MAMMARY CARCINOMA.  - SEE COMMENT.   B. BREAST, RIGHT, ADDITIONAL ANTERIOR MARGIN; EXCISION:  - BENIGN MAMMARY PARENCHYMA WITH INCIDENTAL LOBULAR NEOPLASIA.  - NEGATIVE FOR DCIS AND INVASIVE CARCINOMA.   C. LYMPH NODE, RIGHT SENTINEL #1; EXCISION:  - TWO LYMPH NODES, NEGATIVE FOR MALIGNANCY (0/2).   D. LYMPH NODE, RIGHT SENTINEL #2; EXCISION:  - ONE LYMPH NODE, NEGATIVE FOR MALIGNANCY (0/1).   E. LYMPH NODE, RIGHT SENTINEL #3; EXCISION:  - ONE LYMPH NODE, NEGATIVE FOR MALIGNANCY (0/1).   Comment:  This case is reviewed together with the previous diagnostic biopsy  [id number]).  The previous specimen consisted of two biopsy sites,  with one showing invasive mammary carcinoma (venus-shaped clip), and the  second with a benign fibroadenoma (ribbon-shaped clip). The current case  was seen in intraoperative consultation, and a single biopsy clip was  identified, however, the shape of this clip could not be determined due  to orientation on specimen radiograph. Adjacent to this clip was an area  of ill-defined fibrosis, extending to the anterior aspect.  A second  biopsy clip was not seen.  The findings were discussed at the time of  operation, as well as the surgeon's concern that the area of invasive  carcinoma should have been at the posterior aspect.   Histologic review following formalin fixation and tissue processing  shows a fibroadenoma at the anterior aspect of the specimen, associated  with clip and biopsy site. The clip was found following examination, and  determined to be ribbon-shaped. There is also an area of  fibrosis/scarring and fat necrosis at the posterior aspect of the  specimen, without definitive evidence of biopsy site changes. The  remaining tissue at the posterior edge of the specimen was also  submitted for evaluation. Given the absence of a second tissue biopsy  clip, or residual invasive carcinoma,  there is a high likelihood that  the original diagnostic biopsy site and residual invasive carcinoma were  not excised.   GROSS DESCRIPTION:  Intraoperative Consultation:      Labeled: Right breast mass      Received: Fresh      Specimen: Partial mastectomy      Pathologic evaluation performed: Gross margin evaluation      Diagnosis: IOC: Clip present; firm tissue palpable at the anterior  edge      Communicated to: Called to Dr. Lysle Pearl at 11:18 AM on 08/12/2021 Allena Napoleon M.D.      Tissue submitted: None   A. Labeled: Right breast mass  Received: Fresh  Specimen radiograph image(s) available for review  Radiographic findings: A clip is identified within the specimen.  The RF  tag is within the container but outside of the specimen.  Time in fixative: Collected at 10:58 AM on 08/12/2021 and placed in  formalin at 11:18 AM on 08/12/2021                                   cold  ischemic time: Approximately 30 minutes  Total fixation time: 8.5 hours  Type of procedure: Partial mastectomy  Location / laterality of specimen: Right breast  Orientation of specimen: The specimen is received inked and is oriented  with clear plastic clips labeled as follows: S, I, A, P, L, and M.  Inking:  Anterior = green  Inferior = blue  Lateral = orange  Medial =  yellow  Posterior = black  Superior = red  Size of specimen: 5.8 (anterior to posterior) x 4.9 (medial to lateral)  x 2 (superior to inferior) cm  Skin: None grossly identified.   Biopsy site: There is a biopsy site which contains a ribbon shaped clip.   Number of discrete masses: 1  Size of mass(es): 0.8 x 0.7 x 0.5 cm  Description of mass(es): The mass is white-pink, firm, and partially  well-circumscribed.  Distance between masses/clips: The clip is located within the mass.  Margins: Anterior = less than 0.1 cm, medial = 0.1 cm, inferior = 2.4  cm, lateral = 3 cm, superior = 1.4 cm, posterior = 4.2 cm  Description of remainder of  tissue: Located 3.1 cm posterior to the mass  is an ill-defined area of firm slightly hemorrhagic adipose tissue, 0.8  x 0.5 x 0.3 cm.  This area is less than 0.1 cm to the posterior margin,  0.5 cm to the medial margin, 0.9 cm to the inferior margin, 1.5 cm to  the superior margin, 3.1 cm to the lateral margin and 2.5 cm to the  anterior margin.  The remainder of the specimen is comprised of yellow  lobulated adipose tissue admixed with tan-white firm fibrous tissue.  The fat to fibrous tissue ratio is 70:30.   Block summary (The mass and additional area of hemorrhagic adipose  tissue are submitted entirely.  Approximately 60% of the specimen  remains.):  1 - 2 - posterior margin, perpendicularly sectioned  3 - closest posterior margins to mass  4 - closest lateral margins to mass and additional area of hemorrhagic  adipose tissue  5 - closest superior margin to mass  6 - 8 - mass with closest approach to anterior and medial margins,  submitted sequentially from posterior to anterior       7 - clip site  9 - 10 - additional area of hemorrhagic adipose tissue with closest  approach to posterior, medial, and inferior margins  11 - closest superior margins to additional area of hemorrhagic adipose  tissue  12 - closest anterior margins to additional area of hemorrhagic adipose  tissue  13 - 16 - anterior margin, perpendicularly sectioned  17 - 21 - remaining posterior margin   B. Labeled: Right breast anterior margin  Received: Fresh  Specimen radiograph image(s) available for review  Radiographic findings: None provided  Time in fixative: Collected at 11:52 AM on 08/12/2021 and placed in  formalin at 12:01 PM on 08/12/2021                                   cold  ischemic time: Less than 15 minutes  Total fixation time: Approximately 7.75 hours  Type of procedure: Partial mastectomy  Location / laterality of specimen: Right breast, anterior  Orientation of specimen: The specimen is  received inked.  Inking:  Anterior = green  Inferior = blue  Lateral = orange  Medial = yellow  Posterior = black  Superior = red  Size of specimen: 4.9 (superior to inferior) x 3.5 (medial to lateral) x  1.8 cm (anterior to posterior) cm  Skin: None grossly identified.   Biopsy site: None grossly identified.  Number of discrete masses: None grossly identified.  Description of tissue: Sectioning reveals yellow lobulated adipose  tissue admixed with white firm fibrous tissue.  The fat to fibrous  tissue ratio is 40:60.  Block summary (the specimen is submitted entirely):  1 - inferior margin, perpendicularly sectioned  2 - 12 - central sections, submitted entirely and sequentially from  inferior to superior  13 - 14 - superior margin, perpendicularly sectioned   C. Labeled: Right breast sentinel lymph node #1  Received: Formalin  Collection time: 11:23 AM on 08/12/2021  Placed into formalin time: 11:30 AM on 08/12/2021  Tissue fragment(s): 1  Size: 3 x 2.5 x 1.5 cm  Description: Received is a fragment of yellow lobulated adipose tissue.  There are 2 embedded lymph node candidates ranging from 0.4 to 0.7 cm in  greatest dimension.  The lymph node candidates are entirely submitted in cassettes 1-2 with 1  lymph node candidate in cassette 1 and 1 bisected lymph node candidate  in cassette 2.   D.  Labeled: Right breast sentinel lymph node #2  Received: Formalin  Collection time: 11:27 AM on 08/12/2021  Placed into formalin time: 11:30 AM on 08/12/2021  Tissue fragment(s): 1  Size: 3.6 x 2.4 x 1.2 cm  Description: Received is a fragment of yellow lobulated adipose tissue.  There is an embedded 1.2 x 0.5 x 0.4 cm lymph node candidate.  The lymph node candidate is bisected and entirely submitted in 1  cassette.   E. Labeled: Right breast sentinel node #3  Received: Formalin  Collection time: 11:42 AM on 08/12/2021  Placed into formalin time: 11:42 AM on 08/12/2021  Tissue  fragment(s): 1  Size: 2.3 x 1.7 x 0.7 cm  Description: Received is a fragment of yellow lobulated adipose tissue.  There is an embedded 0.5 x 0.4 x 0.3 cm lymph node candidate.  The lymph node candidate is bisected and entirely submitted in 1  cassette.   RB 08/12/2021    Final Diagnosis performed by Allena Napoleon, MD.   Electronically signed  08/16/2021 12:40:27PM  The electronic signature indicates that the named Attending Pathologist  has evaluated the specimen  Technical component performed at [facility], [address], [place],  [place] [address] Lab: [phone] Dir: Rush Farmer, MD, MMM   Professional component performed at [facility], [facility], [place], [place], [place] [address] Lab: [phone]  Dir: Dellia Nims. Rubinas, MD      RADS: N/A   Assessment:    Malignant neoplasm of upper-inner quadrant of right female breast, unspecified estrogen receptor status (CMS-HCC) [C50.211]    Will proceed with re-excision   Plan:      1. Malignant neoplasm of upper-inner quadrant of right female breast, unspecified estrogen receptor status (CMS-HCC) [C50.211]  Discussed the risk of surgery including recurrence, chronic pain, post-op infxn, poor/delayed wound healing, poor cosmesis, seroma, hematoma formation, and possible re-operation to address said risks. The risks of general anesthetic, if used, includes MI, CVA, sudden death or even reaction to anesthetic medications also discussed.  Typical post-op recovery time and possbility of activity restrictions were also discussed.  Alternatives include continued observation.  Benefits include possible symptom relief, pathologic evaluation, and/or curative excision.    The patient verbalized understanding and all questions were answered to the patient's satisfaction.   2. Patient has elected to proceed with surgical treatment. Procedure will be scheduled. Right partial mastectomy with needle wire localization

## 2021-08-18 NOTE — H&P (View-Only) (Signed)
Subjective:    CC: Malignant neoplasm of upper-inner quadrant of right female breast, unspecified estrogen receptor status (CMS-HCC) [C50.211] HPI:  Barbara Contreras is a 70 y.o. female who returns for above.  Path unfortunately showed no evidence of malignancy in excised tissue, so recommended re-excision.  PMH: Arthritis, Asthma without status asthmaticus, unspecified, COPD (chronic obstructive pulmonary disease) (CMS-HCC), Depression, carotid artery stenosis, Lyme disease, Hyperlipidemia, Hypertension, Irritable bowel syndrome with diarrhea, Sciatica, Seizures (CMS-HCC), Tremor, and Vitamin D deficiency.   Past Surgical History:  has a past surgical history that includes Carotid endarterectomy (Right, 2014); Hysterectomy (2000); Cholecystectomy (2016); Paranasal Sinusotomy (1986); Colonoscopy (09/13/2018); Oophorectomy; and egd (05/03/2021).   Family History: family history includes Alcohol abuse in her father and mother; Alzheimer's disease in her father; Anxiety in her father and sister; Bipolar disorder in her sister; Coronary Artery Disease (Blocked arteries around heart) in her mother; Depression in her father and sister; Diabetes type II in her mother and sister; Glaucoma in her mother; High blood pressure (Hypertension) in her mother and sister; Hyperlipidemia (Elevated cholesterol) in her mother; Obesity in her mother; Thyroid disease in her mother and sister.   Social History:  reports that she has been smoking cigarettes. She has been smoking about 0.50 packs per day. She has never used smokeless tobacco. She reports current alcohol use. She reports current drug use. Drug: Marijuana.   Current Medications: has a current medication list which includes the following prescription(s): albuterol, amlodipine, aspirin, atorvastatin, baclofen, cholecalciferol, clobetasol, dicyclomine, fluticasone propionate, gabapentin, ketoconazole, lisinopril-hydrochlorothiazide, montelukast, multivitamin with  iron-minerals, naproxen, omeprazole, pantoprazole, tiotropium, venlafaxine, zinc oxide, and ipratropium.   Allergies:       Allergies as of 07/27/2021 - Reviewed 07/27/2021  Allergen Reaction Noted   Morphine Itching and Hallucination 07/08/2015   Penicillin Unknown 07/08/2015   Adhesive tape-silicones Other (See Comments) 06/11/2018   Clindamycin Muscle Pain 08/15/2016   Phenergan vc [promethazine-phenylephrine] Other (See Comments) 08/15/2016      ROS:  A 15 point review of systems was performed and was negative except as noted in HPI   Objective:      BP 130/79   Pulse 82   Ht 162.6 cm ('5\' 4"'$ )   Wt 77.6 kg (171 lb)   BMI 29.35 kg/m    Constitutional :  alert, appears stated age, cooperative and no distress  Lymphatics/Throat:  no asymmetry, masses, or scars  Respiratory:  clear to auscultation bilaterally  Cardiovascular:  regular rate and rhythm, S1, S2 normal, no murmur, click, rub or gallop and regular rate and rhythm  Gastrointestinal: soft, non-tender; bowel sounds normal; no masses,  no organomegaly.   Musculoskeletal: Steady gait and movement  Skin: Cool and moist  Psychiatric: Normal affect, non-agitated, not confused  Breast:  Chaperone present for exam.  breasts appear normal, no suspicious masses, no skin or nipple changes or axillary nodes.    LABS:  SURGICAL PATHOLOGY SURGICAL PATHOLOGY  CASE: [id number]  PATIENT: [facility]  Surgical Pathology Report      Specimen Submitted:  A. Breast, right  B. Breast, right  C. Sentinel lymph node 1, right breast  D. Sentinel lymph node 2, right breast  E. Sentinel lymph node 3, right breast   Clinical History: Malignant neoplasm of upper-inner quadrant of right  female breast, unspecified estrogen receptor status CMS-HCC C50.211     DIAGNOSIS:  A. BREAST, RIGHT; PARTIAL MASTECTOMY:  - BENIGN MAMMARY PARENCHYMA WITH FIBROADENOMA AND INCIDENTAL LOBULAR  NEOPLASIA.  - SINGLE CLIP AND BIOPSY SITE  IDENTIFIED.  - NO EVIDENCE OF DCIS OR RESIDUAL INVASIVE MAMMARY CARCINOMA.  - SEE COMMENT.   B. BREAST, RIGHT, ADDITIONAL ANTERIOR MARGIN; EXCISION:  - BENIGN MAMMARY PARENCHYMA WITH INCIDENTAL LOBULAR NEOPLASIA.  - NEGATIVE FOR DCIS AND INVASIVE CARCINOMA.   C. LYMPH NODE, RIGHT SENTINEL #1; EXCISION:  - TWO LYMPH NODES, NEGATIVE FOR MALIGNANCY (0/2).   D. LYMPH NODE, RIGHT SENTINEL #2; EXCISION:  - ONE LYMPH NODE, NEGATIVE FOR MALIGNANCY (0/1).   E. LYMPH NODE, RIGHT SENTINEL #3; EXCISION:  - ONE LYMPH NODE, NEGATIVE FOR MALIGNANCY (0/1).   Comment:  This case is reviewed together with the previous diagnostic biopsy  [id number]).  The previous specimen consisted of two biopsy sites,  with one showing invasive mammary carcinoma (venus-shaped clip), and the  second with a benign fibroadenoma (ribbon-shaped clip). The current case  was seen in intraoperative consultation, and a single biopsy clip was  identified, however, the shape of this clip could not be determined due  to orientation on specimen radiograph. Adjacent to this clip was an area  of ill-defined fibrosis, extending to the anterior aspect.  A second  biopsy clip was not seen.  The findings were discussed at the time of  operation, as well as the surgeon's concern that the area of invasive  carcinoma should have been at the posterior aspect.   Histologic review following formalin fixation and tissue processing  shows a fibroadenoma at the anterior aspect of the specimen, associated  with clip and biopsy site. The clip was found following examination, and  determined to be ribbon-shaped. There is also an area of  fibrosis/scarring and fat necrosis at the posterior aspect of the  specimen, without definitive evidence of biopsy site changes. The  remaining tissue at the posterior edge of the specimen was also  submitted for evaluation. Given the absence of a second tissue biopsy  clip, or residual invasive carcinoma,  there is a high likelihood that  the original diagnostic biopsy site and residual invasive carcinoma were  not excised.   GROSS DESCRIPTION:  Intraoperative Consultation:      Labeled: Right breast mass      Received: Fresh      Specimen: Partial mastectomy      Pathologic evaluation performed: Gross margin evaluation      Diagnosis: IOC: Clip present; firm tissue palpable at the anterior  edge      Communicated to: Called to Dr. Lysle Pearl at 11:18 AM on 08/12/2021 Allena Napoleon M.D.      Tissue submitted: None   A. Labeled: Right breast mass  Received: Fresh  Specimen radiograph image(s) available for review  Radiographic findings: A clip is identified within the specimen.  The RF  tag is within the container but outside of the specimen.  Time in fixative: Collected at 10:58 AM on 08/12/2021 and placed in  formalin at 11:18 AM on 08/12/2021                                   cold  ischemic time: Approximately 30 minutes  Total fixation time: 8.5 hours  Type of procedure: Partial mastectomy  Location / laterality of specimen: Right breast  Orientation of specimen: The specimen is received inked and is oriented  with clear plastic clips labeled as follows: S, I, A, P, L, and M.  Inking:  Anterior = green  Inferior = blue  Lateral = orange  Medial =  yellow  Posterior = black  Superior = red  Size of specimen: 5.8 (anterior to posterior) x 4.9 (medial to lateral)  x 2 (superior to inferior) cm  Skin: None grossly identified.   Biopsy site: There is a biopsy site which contains a ribbon shaped clip.   Number of discrete masses: 1  Size of mass(es): 0.8 x 0.7 x 0.5 cm  Description of mass(es): The mass is white-pink, firm, and partially  well-circumscribed.  Distance between masses/clips: The clip is located within the mass.  Margins: Anterior = less than 0.1 cm, medial = 0.1 cm, inferior = 2.4  cm, lateral = 3 cm, superior = 1.4 cm, posterior = 4.2 cm  Description of remainder of  tissue: Located 3.1 cm posterior to the mass  is an ill-defined area of firm slightly hemorrhagic adipose tissue, 0.8  x 0.5 x 0.3 cm.  This area is less than 0.1 cm to the posterior margin,  0.5 cm to the medial margin, 0.9 cm to the inferior margin, 1.5 cm to  the superior margin, 3.1 cm to the lateral margin and 2.5 cm to the  anterior margin.  The remainder of the specimen is comprised of yellow  lobulated adipose tissue admixed with tan-white firm fibrous tissue.  The fat to fibrous tissue ratio is 70:30.   Block summary (The mass and additional area of hemorrhagic adipose  tissue are submitted entirely.  Approximately 60% of the specimen  remains.):  1 - 2 - posterior margin, perpendicularly sectioned  3 - closest posterior margins to mass  4 - closest lateral margins to mass and additional area of hemorrhagic  adipose tissue  5 - closest superior margin to mass  6 - 8 - mass with closest approach to anterior and medial margins,  submitted sequentially from posterior to anterior       7 - clip site  9 - 10 - additional area of hemorrhagic adipose tissue with closest  approach to posterior, medial, and inferior margins  11 - closest superior margins to additional area of hemorrhagic adipose  tissue  12 - closest anterior margins to additional area of hemorrhagic adipose  tissue  13 - 16 - anterior margin, perpendicularly sectioned  17 - 21 - remaining posterior margin   B. Labeled: Right breast anterior margin  Received: Fresh  Specimen radiograph image(s) available for review  Radiographic findings: None provided  Time in fixative: Collected at 11:52 AM on 08/12/2021 and placed in  formalin at 12:01 PM on 08/12/2021                                   cold  ischemic time: Less than 15 minutes  Total fixation time: Approximately 7.75 hours  Type of procedure: Partial mastectomy  Location / laterality of specimen: Right breast, anterior  Orientation of specimen: The specimen is  received inked.  Inking:  Anterior = green  Inferior = blue  Lateral = orange  Medial = yellow  Posterior = black  Superior = red  Size of specimen: 4.9 (superior to inferior) x 3.5 (medial to lateral) x  1.8 cm (anterior to posterior) cm  Skin: None grossly identified.   Biopsy site: None grossly identified.  Number of discrete masses: None grossly identified.  Description of tissue: Sectioning reveals yellow lobulated adipose  tissue admixed with white firm fibrous tissue.  The fat to fibrous  tissue ratio is 40:60.  Block summary (the specimen is submitted entirely):  1 - inferior margin, perpendicularly sectioned  2 - 12 - central sections, submitted entirely and sequentially from  inferior to superior  13 - 14 - superior margin, perpendicularly sectioned   C. Labeled: Right breast sentinel lymph node #1  Received: Formalin  Collection time: 11:23 AM on 08/12/2021  Placed into formalin time: 11:30 AM on 08/12/2021  Tissue fragment(s): 1  Size: 3 x 2.5 x 1.5 cm  Description: Received is a fragment of yellow lobulated adipose tissue.  There are 2 embedded lymph node candidates ranging from 0.4 to 0.7 cm in  greatest dimension.  The lymph node candidates are entirely submitted in cassettes 1-2 with 1  lymph node candidate in cassette 1 and 1 bisected lymph node candidate  in cassette 2.   D.  Labeled: Right breast sentinel lymph node #2  Received: Formalin  Collection time: 11:27 AM on 08/12/2021  Placed into formalin time: 11:30 AM on 08/12/2021  Tissue fragment(s): 1  Size: 3.6 x 2.4 x 1.2 cm  Description: Received is a fragment of yellow lobulated adipose tissue.  There is an embedded 1.2 x 0.5 x 0.4 cm lymph node candidate.  The lymph node candidate is bisected and entirely submitted in 1  cassette.   E. Labeled: Right breast sentinel node #3  Received: Formalin  Collection time: 11:42 AM on 08/12/2021  Placed into formalin time: 11:42 AM on 08/12/2021  Tissue  fragment(s): 1  Size: 2.3 x 1.7 x 0.7 cm  Description: Received is a fragment of yellow lobulated adipose tissue.  There is an embedded 0.5 x 0.4 x 0.3 cm lymph node candidate.  The lymph node candidate is bisected and entirely submitted in 1  cassette.   RB 08/12/2021    Final Diagnosis performed by Allena Napoleon, MD.   Electronically signed  08/16/2021 12:40:27PM  The electronic signature indicates that the named Attending Pathologist  has evaluated the specimen  Technical component performed at [facility], [address] [place],  [place] [address] Lab: [phone] Dir: Rush Farmer, MD, MMM   Professional component performed at [facility], [place], [place], [place] [address] Lab: [phone]  Dir: Dellia Nims. Rubinas, MD      RADS: N/A   Assessment:    Malignant neoplasm of upper-inner quadrant of right female breast, unspecified estrogen receptor status (CMS-HCC) [C50.211]    Will proceed with re-excision   Plan:      1. Malignant neoplasm of upper-inner quadrant of right female breast, unspecified estrogen receptor status (CMS-HCC) [C50.211]  Discussed the risk of surgery including recurrence, chronic pain, post-op infxn, poor/delayed wound healing, poor cosmesis, seroma, hematoma formation, and possible re-operation to address said risks. The risks of general anesthetic, if used, includes MI, CVA, sudden death or even reaction to anesthetic medications also discussed.  Typical post-op recovery time and possbility of activity restrictions were also discussed.  Alternatives include continued observation.  Benefits include possible symptom relief, pathologic evaluation, and/or curative excision.    The patient verbalized understanding and all questions were answered to the patient's satisfaction.   2. Patient has elected to proceed with surgical treatment. Procedure will be scheduled. Right partial mastectomy with needle wire localization

## 2021-08-22 ENCOUNTER — Encounter
Admission: RE | Admit: 2021-08-22 | Discharge: 2021-08-22 | Disposition: A | Discharge status: Home / Self Care | Payer: Medicare Other | Source: Ambulatory Visit | Attending: Surgery | Admitting: Surgery

## 2021-08-22 NOTE — Patient Instructions (Addendum)
Your procedure is scheduled on:08-24-21 Wednesday Report to [facility] @ 7:45 AM   REMEMBER: Instructions that are not followed completely may result in serious medical risk, up to and including death; or upon the discretion of your surgeon and anesthesiologist your surgery may need to be rescheduled.  Do not eat food after midnight the night before surgery.  No gum chewing, lozengers or hard candies.  You may however, drink CLEAR liquids up to 2 hours before you are scheduled to arrive for your surgery. Do not drink anything within 2 hours of your scheduled arrival time.  Clear liquids include: - water  - apple juice without pulp - gatorade (not RED, PURPLE, OR BLUE) - black coffee or tea (Do NOT add milk or creamers to the coffee or tea) Do NOT drink anything that is not on this list.  TAKE THESE MEDICATIONS THE MORNING OF SURGERY WITH A SIP OF WATER: -amLODipine (NORVASC) 5 MG tablet -atorvastatin (LIPITOR) 40 MG tablet -venlafaxine XR (EFFEXOR-XR) 75 MG 24 hr capsule -dicyclomine (BENTYL) 10 MG capsule -albuterol (VENTOLIN HFA) 108 (90 Base) MCG/ACT inhaler -tiotropium (SPIRIVA) 18 MCG inhalation capsule  81 mg Aspirin was stopped on 08-19-21 as instructed by Dr Lysle Pearl  One week prior to surgery: Stop Anti-inflammatories (NSAIDS) such as Advil, Aleve, Ibuprofen, Motrin, Naproxen, Naprosyn and Aspirin based products such as Excedrin, Goodys Powder, BC Powder.You may however, continue to take Tylenol/Tramadol if needed for pain up until the day of surgery.  Stop ANY OVER THE COUNTER supplements/vitamins until after surgery-All vitamins were stopped on 08-19-21  No Alcohol for 24 hours before or after surgery.  No Smoking including e-cigarettes for 24 hours prior to surgery.  No chewable tobacco products for at least 6 hours prior to surgery.  No nicotine patches on the day of surgery.  Do not use any "recreational" drugs for at least a week prior to your surgery.   Please be advised that the combination of cocaine and anesthesia may have negative outcomes, up to and including death. If you test positive for cocaine, your surgery will be cancelled.  On the morning of surgery brush your teeth with toothpaste and water, you may rinse your mouth with mouthwash if you wish. Do not swallow any toothpaste or mouthwash.  Use CHG Soap as directed on instruction sheet.  Do not wear jewelry, make-up, hairpins, clips or nail polish.  Do not wear lotions, powders, or perfumes.   Do not shave body from the neck down 48 hours prior to surgery just in case you cut yourself which could leave a site for infection.  Also, freshly shaved skin may become irritated if using the CHG soap.  Contact lenses, hearing aids and dentures may not be worn into surgery.  Do not bring valuables to the hospital. [facility] is not responsible for any missing/lost belongings or valuables.   Notify your doctor if there is any change in your medical condition (cold, fever, infection).  Wear comfortable clothing (specific to your surgery type) to the hospital.  After surgery, you can help prevent lung complications by doing breathing exercises.  Take deep breaths and cough every 1-2 hours. Your doctor may order a device called an Incentive Spirometer to help you take deep breaths. When coughing or sneezing, hold a pillow firmly against your incision with both hands. This is called "splinting." Doing this helps protect your incision. It also decreases belly discomfort.  If you are being admitted to the hospital overnight, leave your suitcase in  the car. After surgery it may be brought to your room.  If you are being discharged the day of surgery, you will not be allowed to drive home. You will need a responsible adult (18 years or older) to drive you home and stay with you that night.   If you are taking public transportation, you will need to have a responsible adult (18 years or  older) with you. Please confirm with your physician that it is acceptable to use public transportation.   Please call the [facility] at [phone] if you have any questions about these instructions.  Surgery Visitation Policy:  Patients undergoing a surgery or procedure may have one family member or support person with them as long as that person is not COVID-19 positive or experiencing its symptoms.  That person may remain in the waiting area during the procedure and may rotate out with other people.  Inpatient Visitation:    Visiting hours are 7 a.m. to 8 p.m. Up to two visitors ages 16+ are allowed at one time in a patient room. The visitors may rotate out with other people during the day. Visitors must check out when they leave, or other visitors will not be allowed. One designated support person may remain overnight. The visitor must pass COVID-19 screenings, use hand sanitizer when entering and exiting the patient's room and wear a mask at all times, including in the patient's room. Patients must also wear a mask when staff or their visitor are in the room. Masking is required regardless of vaccination status.

## 2021-08-24 ENCOUNTER — Ambulatory Visit
Admission: RE | Admit: 2021-08-24 | Discharge: 2021-08-24 | Disposition: A | Discharge status: Home / Self Care | Payer: Medicare Other | Source: Ambulatory Visit | Attending: Surgery | Admitting: Surgery

## 2021-08-24 ENCOUNTER — Ambulatory Visit
Admission: RE | Admit: 2021-08-24 | Discharge: 2021-08-24 | Disposition: A | Discharge status: Home / Self Care | Payer: Medicare Other | Attending: Surgery | Admitting: Surgery

## 2021-08-24 ENCOUNTER — Encounter
Admission: RE | Disposition: A | Discharge status: Home / Self Care | Payer: Self-pay | Source: Home / Self Care | Attending: Surgery

## 2021-08-24 ENCOUNTER — Inpatient Hospital Stay: Payer: Medicare Other | Admitting: Internal Medicine

## 2021-08-24 ENCOUNTER — Ambulatory Visit: Payer: Medicare Other | Admitting: Certified Registered Nurse Anesthetist

## 2021-08-24 ENCOUNTER — Encounter: Payer: Self-pay | Admitting: Surgery

## 2021-08-24 ENCOUNTER — Other Ambulatory Visit: Payer: Self-pay

## 2021-08-24 DIAGNOSIS — C50211 Malignant neoplasm of upper-inner quadrant of right female breast: Secondary | ICD-10-CM | POA: Diagnosis present

## 2021-08-24 DIAGNOSIS — F17210 Nicotine dependence, cigarettes, uncomplicated: Secondary | ICD-10-CM | POA: Insufficient documentation

## 2021-08-24 DIAGNOSIS — Z79899 Other long term (current) drug therapy: Secondary | ICD-10-CM | POA: Diagnosis not present

## 2021-08-24 DIAGNOSIS — Z880 Allergy status to penicillin: Secondary | ICD-10-CM | POA: Diagnosis not present

## 2021-08-24 DIAGNOSIS — Z885 Allergy status to narcotic agent status: Secondary | ICD-10-CM | POA: Insufficient documentation

## 2021-08-24 DIAGNOSIS — Z7982 Long term (current) use of aspirin: Secondary | ICD-10-CM | POA: Diagnosis not present

## 2021-08-24 DIAGNOSIS — Z881 Allergy status to other antibiotic agents status: Secondary | ICD-10-CM | POA: Insufficient documentation

## 2021-08-24 DIAGNOSIS — C50411 Malignant neoplasm of upper-outer quadrant of right female breast: Secondary | ICD-10-CM

## 2021-08-24 DIAGNOSIS — Z170 Estrogen receptor positive status [ER+]: Secondary | ICD-10-CM

## 2021-08-24 HISTORY — PX: BREAST LUMPECTOMY: SHX2

## 2021-08-24 HISTORY — DX: Gastro-esophageal reflux disease without esophagitis: K21.9

## 2021-08-24 HISTORY — PX: RE-EXCISION OF BREAST CANCER,SUPERIOR MARGINS: SHX6047

## 2021-08-24 SURGERY — RE-EXCISION OF BREAST CANCER,SUPERIOR MARGINS
Anesthesia: General | Site: Breast | Laterality: Right

## 2021-08-24 MED ORDER — CEFAZOLIN SODIUM-DEXTROSE 2-4 GM/100ML-% IV SOLN
2.0000 g | INTRAVENOUS | Status: AC
Start: 2021-08-24 — End: 2021-08-24
  Administered 2021-08-24: 2 g via INTRAVENOUS

## 2021-08-24 MED ORDER — EPHEDRINE SULFATE 50 MG/ML IJ SOLN
INTRAMUSCULAR | Status: DC | PRN
  Administered 2021-08-24 (×2): 5 mg via INTRAVENOUS
  Administered 2021-08-24: 10 mg via INTRAVENOUS

## 2021-08-24 MED ORDER — CHLORHEXIDINE GLUCONATE CLOTH 2 % EX PADS: 6.0000 | MEDICATED_PAD | Freq: Once | CUTANEOUS | Status: DC

## 2021-08-24 MED ORDER — ONDANSETRON HCL 4 MG/2ML IJ SOLN
INTRAMUSCULAR | Status: DC | PRN
  Administered 2021-08-24: 4 mg via INTRAVENOUS

## 2021-08-24 MED ORDER — BUPIVACAINE-EPINEPHRINE 0.5% -1:200000 IJ SOLN
INTRAMUSCULAR | Status: DC | PRN
  Administered 2021-08-24: 3.5 mL

## 2021-08-24 MED ORDER — CHLORHEXIDINE GLUCONATE 0.12 % MT SOLN
OROMUCOSAL | Status: AC
  Administered 2021-08-24: 15 mL via OROMUCOSAL
  Filled 2021-08-24: qty 15

## 2021-08-24 MED ORDER — STERILE WATER FOR IRRIGATION IR SOLN
Status: DC | PRN
  Administered 2021-08-24: 1000 mL

## 2021-08-24 MED ORDER — ACETAMINOPHEN 10 MG/ML IV SOLN
INTRAVENOUS | Status: DC | PRN
  Administered 2021-08-24: 1000 mg via INTRAVENOUS

## 2021-08-24 MED ORDER — ORAL CARE MOUTH RINSE: 15.0000 mL | Freq: Once | OROMUCOSAL | Status: AC

## 2021-08-24 MED ORDER — ONDANSETRON HCL 4 MG/2ML IJ SOLN
INTRAMUSCULAR | Status: AC
  Filled 2021-08-24: qty 2

## 2021-08-24 MED ORDER — FAMOTIDINE 20 MG PO TABS
ORAL_TABLET | ORAL | Status: AC
  Filled 2021-08-24: qty 1

## 2021-08-24 MED ORDER — LACTATED RINGERS IV SOLN: INTRAVENOUS | Status: DC

## 2021-08-24 MED ORDER — LIDOCAINE HCL (PF) 1 % IJ SOLN
INTRAMUSCULAR | Status: AC
  Filled 2021-08-24: qty 30

## 2021-08-24 MED ORDER — PROPOFOL 10 MG/ML IV BOLUS
INTRAVENOUS | Status: DC | PRN
  Administered 2021-08-24: 140 mg via INTRAVENOUS
  Administered 2021-08-24: 30 mg via INTRAVENOUS

## 2021-08-24 MED ORDER — LIDOCAINE HCL (CARDIAC) PF 100 MG/5ML IV SOSY
PREFILLED_SYRINGE | INTRAVENOUS | Status: DC | PRN
  Administered 2021-08-24: 80 mg via INTRAVENOUS

## 2021-08-24 MED ORDER — LIDOCAINE HCL (PF) 2 % IJ SOLN
INTRAMUSCULAR | Status: AC
  Filled 2021-08-24: qty 5

## 2021-08-24 MED ORDER — TRAMADOL HCL 50 MG PO TABS: 50.0000 mg | ORAL_TABLET | Freq: Once | ORAL | Status: AC

## 2021-08-24 MED ORDER — PHENYLEPHRINE HCL (PRESSORS) 10 MG/ML IV SOLN
INTRAVENOUS | Status: DC | PRN
  Administered 2021-08-24 (×6): 100 ug via INTRAVENOUS
  Administered 2021-08-24: 150 ug via INTRAVENOUS
  Administered 2021-08-24 (×3): 100 ug via INTRAVENOUS
  Administered 2021-08-24: 50 ug via INTRAVENOUS
  Administered 2021-08-24 (×5): 100 ug via INTRAVENOUS

## 2021-08-24 MED ORDER — IPRATROPIUM-ALBUTEROL 0.5-2.5 (3) MG/3ML IN SOLN
RESPIRATORY_TRACT | Status: AC
  Administered 2021-08-24: 3 mL via RESPIRATORY_TRACT
  Filled 2021-08-24: qty 3

## 2021-08-24 MED ORDER — FAMOTIDINE 20 MG PO TABS
ORAL_TABLET | ORAL | Status: AC
  Administered 2021-08-24: 20 mg via ORAL
  Filled 2021-08-24: qty 1

## 2021-08-24 MED ORDER — IPRATROPIUM-ALBUTEROL 0.5-2.5 (3) MG/3ML IN SOLN: 3.0000 mL | Freq: Once | RESPIRATORY_TRACT | Status: AC

## 2021-08-24 MED ORDER — GLYCOPYRROLATE 0.2 MG/ML IJ SOLN
INTRAMUSCULAR | Status: DC | PRN
  Administered 2021-08-24: .2 mg via INTRAVENOUS

## 2021-08-24 MED ORDER — CHLORHEXIDINE GLUCONATE 0.12 % MT SOLN: 15.0000 mL | Freq: Once | OROMUCOSAL | Status: AC

## 2021-08-24 MED ORDER — LIDOCAINE HCL 1 % IJ SOLN
INTRAMUSCULAR | Status: DC | PRN
  Administered 2021-08-24: 3.5 mL

## 2021-08-24 MED ORDER — BUPIVACAINE-EPINEPHRINE (PF) 0.5% -1:200000 IJ SOLN
INTRAMUSCULAR | Status: AC
  Filled 2021-08-24: qty 30

## 2021-08-24 MED ORDER — FENTANYL CITRATE (PF) 100 MCG/2ML IJ SOLN
INTRAMUSCULAR | Status: DC | PRN
  Administered 2021-08-24 (×2): 25 ug via INTRAVENOUS

## 2021-08-24 MED ORDER — ACETAMINOPHEN 10 MG/ML IV SOLN: 1000.0000 mg | Freq: Once | INTRAVENOUS | Status: DC | PRN

## 2021-08-24 MED ORDER — ACETAMINOPHEN 10 MG/ML IV SOLN
INTRAVENOUS | Status: AC
  Filled 2021-08-24: qty 100

## 2021-08-24 MED ORDER — TRAMADOL HCL 50 MG PO TABS: 50.0000 mg | ORAL_TABLET | Freq: Four times a day (QID) | ORAL | 0 refills | Status: AC | PRN

## 2021-08-24 MED ORDER — FENTANYL CITRATE (PF) 100 MCG/2ML IJ SOLN: 25.0000 ug | INTRAMUSCULAR | Status: DC | PRN

## 2021-08-24 MED ORDER — ONDANSETRON HCL 4 MG/2ML IJ SOLN: 4.0000 mg | Freq: Once | INTRAMUSCULAR | Status: DC | PRN

## 2021-08-24 MED ORDER — FENTANYL CITRATE (PF) 100 MCG/2ML IJ SOLN
INTRAMUSCULAR | Status: AC
  Filled 2021-08-24: qty 2

## 2021-08-24 MED ORDER — FAMOTIDINE 20 MG PO TABS: 20.0000 mg | ORAL_TABLET | Freq: Once | ORAL | Status: AC

## 2021-08-24 MED ORDER — DEXAMETHASONE SODIUM PHOSPHATE 10 MG/ML IJ SOLN
INTRAMUSCULAR | Status: DC | PRN
  Administered 2021-08-24: 10 mg via INTRAVENOUS

## 2021-08-24 MED ORDER — TRAMADOL HCL 50 MG PO TABS
ORAL_TABLET | ORAL | Status: AC
  Administered 2021-08-24: 50 mg via ORAL
  Filled 2021-08-24: qty 1

## 2021-08-24 MED ORDER — PROPOFOL 10 MG/ML IV BOLUS
INTRAVENOUS | Status: AC
  Filled 2021-08-24: qty 20

## 2021-08-24 MED ORDER — CEFAZOLIN SODIUM-DEXTROSE 2-4 GM/100ML-% IV SOLN
INTRAVENOUS | Status: AC
  Filled 2021-08-24: qty 100

## 2021-08-24 SURGICAL SUPPLY — 50 items
ADH SKN CLS APL DERMABOND .7 (GAUZE/BANDAGES/DRESSINGS) ×1
APL PRP STRL LF DISP 70% ISPRP (MISCELLANEOUS) ×1
APPLIER CLIP 11 MED OPEN (CLIP)
APR CLP MED 11 20 MLT OPN (CLIP)
BLADE SURG 15 STRL LF DISP TIS (BLADE) ×1 IMPLANT
BLADE SURG 15 STRL SS (BLADE) ×2
CHLORAPREP W/TINT 26 (MISCELLANEOUS) ×2 IMPLANT
CLIP APPLIE 11 MED OPEN (CLIP) IMPLANT
CNTNR SPEC 2.5X3XGRAD LEK (MISCELLANEOUS) ×1
CNTNR URN SCR LID CUP LEK RST (MISCELLANEOUS) ×2 IMPLANT
CONT SPEC 4OZ STER OR WHT (MISCELLANEOUS) ×1
CONT SPEC 4OZ STRL OR WHT (MISCELLANEOUS) ×5
CONTAINER SPEC 2.5X3XGRAD LEK (MISCELLANEOUS) ×1 IMPLANT
COVER PROBE FLX POLY STRL (MISCELLANEOUS) ×2 IMPLANT
DERMABOND ADVANCED (GAUZE/BANDAGES/DRESSINGS) ×1
DERMABOND ADVANCED .7 DNX12 (GAUZE/BANDAGES/DRESSINGS) ×1 IMPLANT
DEVICE DSSCT PLSMBLD 3.0S LGHT (MISCELLANEOUS) ×1 IMPLANT
DEVICE DUBIN SPECIMEN MAMMOGRA (MISCELLANEOUS) ×2 IMPLANT
DRAPE LAPAROTOMY 77X122 PED (DRAPES) ×2 IMPLANT
ELECT CAUTERY BLADE TIP 2.5 (TIP) ×2
ELECT REM PT RETURN 9FT ADLT (ELECTROSURGICAL) ×2
ELECTRODE CAUTERY BLDE TIP 2.5 (TIP) ×1 IMPLANT
ELECTRODE REM PT RTRN 9FT ADLT (ELECTROSURGICAL) ×1 IMPLANT
GAUZE 4X4 16PLY ~~LOC~~+RFID DBL (SPONGE) ×2 IMPLANT
GLOVE SURG SYN 6.5 ES PF (GLOVE) ×4 IMPLANT
GLOVE SURG UNDER POLY LF SZ7 (GLOVE) ×2 IMPLANT
GOWN STRL REUS W/ TWL LRG LVL3 (GOWN DISPOSABLE) ×3 IMPLANT
GOWN STRL REUS W/TWL LRG LVL3 (GOWN DISPOSABLE) ×6
KIT MARKER MARGIN INK (KITS) ×2 IMPLANT
KIT TURNOVER KIT A (KITS) ×2 IMPLANT
LABEL OR SOLS (LABEL) ×2 IMPLANT
LIGHT WAVEGUIDE WIDE FLAT (MISCELLANEOUS) IMPLANT
MANIFOLD NEPTUNE II (INSTRUMENTS) ×2 IMPLANT
MARKER MARGIN CORRECT CLIP (MARKER) ×2 IMPLANT
NEEDLE HYPO 22GX1.5 SAFETY (NEEDLE) ×4 IMPLANT
PACK BASIN MINOR ARMC (MISCELLANEOUS) ×2 IMPLANT
PLASMABLADE 3.0S W/LIGHT (MISCELLANEOUS) ×2
SLING ARM M TX990204 (SOFTGOODS) ×2 IMPLANT
SUT MNCRL 4-0 (SUTURE) ×4
SUT MNCRL 4-0 27XMFL (SUTURE) ×2
SUT SILK 3 0 12 30 (SUTURE) ×2 IMPLANT
SUT VIC AB 3-0 SH 27 (SUTURE) ×4
SUT VIC AB 3-0 SH 27X BRD (SUTURE) ×2 IMPLANT
SUTURE MNCRL 4-0 27XMF (SUTURE) ×2 IMPLANT
SWAB CULTURE ESWAB REG 1ML (MISCELLANEOUS) ×2 IMPLANT
SYR 20ML LL LF (SYRINGE) ×2 IMPLANT
SYR BULB IRRIG 60ML STRL (SYRINGE) ×2 IMPLANT
TUBING CONNECTING 10 (TUBING) ×2 IMPLANT
WATER STERILE IRR 1000ML POUR (IV SOLUTION) ×2 IMPLANT
WATER STERILE IRR 500ML POUR (IV SOLUTION) IMPLANT

## 2021-08-24 NOTE — Op Note (Signed)
Preoperative diagnosis:  right breast carcinoma, axillary seroma  Postoperative diagnosis: Same.   Procedure: Needle wire localized right breast partial mastectomy.                      Anesthesia: GETA  Surgeon: Dr. Benjamine Sprague  Wound Classification: Clean  Indications: Patient is a 70 y.o. female with a nonpalpable right breast mass noted on mammography with core biopsy demonstrating breast CA. initial attempt at reexcision did not complete the resection of the known carcinoma so patient returns today after wire needle localization for reexcision.  Patient also had some concerns of increasing swelling in the right axilla along with increasing tenderness to the area.  Physical exam in the preop area noted some swelling but no erythema or purulent drainage indicating obvious infection.  Specimen: Right breast mass, anterior margin, right breast seroma culture Complications: None  Estimated Blood Loss: 22mL  Findings: 1. Specimen mammography shows wire completely excised but no sign of clip.   2. Pathology call refers gross examination of margins was negative 3. No other palpable mass or lymph node identified.  4.  Anterior margin excised based on imaging studies to where the clip should be in relation to the wire.  Operation performed with curative intent:Yes  Tracer(s) used to identify sentinel nodes in the upfront surgery (non-neoadjuvant) setting (select all that apply):N/A  Tracer(s) used to identify sentinel nodes in the neoadjuvant setting (select all that apply):N/A  All nodes (colored or non-colored) present at the end of a dye-filled lymphatic channel were removed:N/A  All significantly radioactive nodes were removed:N/A  All palpable suspicious nodes were removed:N/A  Biopsy-proven positive nodes marked with clips prior to chemotherapy were identified and removed:N/A    Description of procedure: Needle wire localization was performed by radiology prior to procedure.  The patient was taken to the operating room and placed supine on the operating table, and after general anesthesia the right breast and axilla were prepped and draped in the usual sterile fashion. A time-out was completed verifying correct patient, procedure, site, positioning, and implant(s) and/or special equipment prior to beginning this procedure.   Previously made incision was reopened and dissection carried down until a seroma cavity of former lumpectomy cavity noted.  The serous fluid was drained completely.  Within this lumpectomy cavity, the majority of the needle wire was noted to be suspended in mid air with the proximal portion extending towards the skin insertion site, and the distal third and thickened portion of wire embedded in the inferior posterior aspect of the former lumpectomy cavity.  Reexamination of the localization study imaging at this time noted the clip to be essentially adjacent to the distal portion of the wire, in the anterior position.  There was a thin layer of tissue noted anterior to the embedded portion of the wire, with the remaining anterior section being the former biopsy cavity.  The tissue that had the embedded end of the needle wire was then excised generously, including all margins.  The wire remained intact within the excised tissue cavity for the entire portion of the procedure.  Specimen was then oriented with paint and placed in the Faxitron to obtain imaging.  Unfortunately the images showed no presence of the biopsy clip even after removing the needle to confirm it was not hiding behind it.  The specimen was sent to pathology for evaluation and the margins came back grossly normal with scant fibrosis per verbal report over the phone.  Due to  the clip noted to be slightly anterior to the needle wire on imaging, the anterior margin of the former lumpectomy site was removed again with generous amount of tissue, oriented with paint.  This specimen also did not  indicate any clips, but was sent down to pathology for confirmation, since there was no additional anterior tissue that could have been removed anywhere near the needle site at this point.  The cavity was extensively inspected to ensure the clip did not fall out for some reason, there was no evidence of the clip within the cavity.    At this point, further resection of tissue would have been beyond the scope of consented partial mastectomy, so decision was made to abort the procedure and await final pathology results.  The lateral superior aspect of the operative field was palpated and the axillary seroma was encountered and drained completely.  Cultures taken just in case.  Wound was then irrigated, hemostasis was achieved and the wound closed in layers with  interrupted sutures of 3-0 Vicryl in deep dermal layer and a running subcuticular suture of Monocryl 4-0, then dressed with dermabond.  The patient tolerated the procedure well and was taken to the postanesthesia care unit in stable condition. Sponge and instrument count correct at end of procedure.

## 2021-08-24 NOTE — Discharge Instructions (Addendum)
Removal, Care After This sheet gives you information about how to care for yourself after your procedure. Your health care provider may also give you more specific instructions. If you have problems or questions, contact your health care provider. What can I expect after the procedure? After the procedure, it is common to have: Soreness. Bruising. Itching. Follow these instructions at home: site care Follow instructions from your health care provider about how to take care of your site. Make sure you: Wash your hands with soap and water before and after you change your bandage (dressing). If soap and water are not available, use hand sanitizer. Leave stitches (sutures), skin glue, or adhesive strips in place. These skin closures may need to stay in place for 2 weeks or longer. If adhesive strip edges start to loosen and curl up, you may trim the loose edges. Do not remove adhesive strips completely unless your health care provider tells you to do that. If the area bleeds or bruises, apply gentle pressure for 10 minutes. OK TO SHOWER IN 24HRS  Check your site every day for signs of infection. Check for: Redness, swelling, or pain. Fluid or blood. Warmth. Pus or a bad smell.  General instructions Rest and then return to your normal activities as told by your health care provider. RESUME ASPIRIN IN 48HRS  tylenol and advil as needed for discomfort.  Please alternate between the two every four hours as needed for pain.    Use narcotics, if prescribed, only when tylenol and motrin is not enough to control pain.  325-650mg every 8hrs to max of 3000mg/24hrs (including the 325mg in every norco dose) for the tylenol.    Advil up to 800mg per dose every 8hrs as needed for pain.   Keep all follow-up visits as told by your health care provider. This is important. Contact a health care provider if: You have redness, swelling, or pain around your site. You have fluid or blood coming from your  site. Your site feels warm to the touch. You have pus or a bad smell coming from your site. You have a fever. Your sutures, skin glue, or adhesive strips loosen or come off sooner than expected. Get help right away if: You have bleeding that does not stop with pressure or a dressing. Summary After the procedure, it is common to have some soreness, bruising, and itching at the site. Follow instructions from your health care provider about how to take care of your site. Check your site every day for signs of infection. Contact a health care provider if you have redness, swelling, or pain around your site, or your site feels warm to the touch. Keep all follow-up visits as told by your health care provider. This is important. This information is not intended to replace advice given to you by your health care provider. Make sure you discuss any questions you have with your health care provider. Document Released: 12/17/2015 Document Revised: 05/20/2018 Document Reviewed: 05/20/2018 Elsevier Interactive Patient Education  2019 [employer]  AMBULATORY SURGERY  DISCHARGE INSTRUCTIONS   The drugs that you were given will stay in your system until tomorrow so for the next 24 hours you should not:  Drive an automobile Make any legal decisions Drink any alcoholic beverage   You may resume regular meals tomorrow.  Today it is better to start with liquids and gradually work up to solid foods.  You may eat anything you prefer, but it is better to start with liquids, then soup   and crackers, and gradually work up to solid foods.   Please notify your doctor immediately if you have any unusual bleeding, trouble breathing, redness and pain at the surgery site, drainage, fever, or pain not relieved by medication.    Additional Instructions:        Please contact your physician with any problems or Same Day Surgery at [phone], Monday through Friday 6 am to 4 pm, or [place] at  [place] Main number at [phone].  

## 2021-08-24 NOTE — Anesthesia Preprocedure Evaluation (Signed)
Anesthesia Evaluation  Patient identified by MRN, date of birth, ID band Patient awake    Reviewed: Allergy & Precautions, NPO status , Patient's Chart, lab work & pertinent test results  History of Anesthesia Complications Negative for: history of anesthetic complications  Airway Mallampati: III  TM Distance: <3 FB Neck ROM: limited    Dental  (+) Poor Dentition, Missing, Chipped   Pulmonary asthma , COPD,  COPD inhaler, Current Smoker and Patient abstained from smoking.,    Pulmonary exam normal breath sounds clear to auscultation       Cardiovascular Exercise Tolerance: Good hypertension, (-) angina+ CAD  (-) Past MI and (-) Cardiac Stents Normal cardiovascular exam(-) dysrhythmias + Valvular Problems/Murmurs MR  Rhythm:Regular Rate:Normal  TTE 2018: - Left ventricle: The cavity size was normal. There was moderate  concentric hypertrophy. Systolic function was vigorous. The  estimated ejection fraction was in the range of 65% to 70%.  - Aortic valve: Valve area (Vmax): 2.72 cm^2.  - Mitral valve: There was mild regurgitation.  - Left atrium: The atrium was mildly dilated.    Neuro/Psych Seizures -,  PSYCHIATRIC DISORDERS Depression TIA Neuromuscular disease    GI/Hepatic Neg liver ROS, GERD  ,  Endo/Other  negative endocrine ROS  Renal/GU      Musculoskeletal  (+) Arthritis ,   Abdominal   Peds  Hematology negative hematology ROS (+)   Anesthesia Other Findings Past Medical History: No date: Arthritis No date: Asthma No date: Back pain No date: Carotid artery stenosis No date: Carotid stenosis No date: Coronary artery disease No date: Depression No date: Emphysema of lung (HCC) No date: Heart murmur No date: Hip pain No date: History of Lyme disease No date: HLD (hyperlipidemia) No date: Hyperlipemia No date: Hypertension No date: IBS (irritable bowel syndrome) No date: IBS (irritable bowel  syndrome) No date: Lyme disease No date: Pre-diabetes No date: Sciatica No date: Sciatica No date: Seizures (HCC) No date: Tremor No date: Vitamin D deficiency No date: Vitamin D deficiency  Past Surgical History: No date: ABDOMINAL HYSTERECTOMY 07/22/2021: BREAST BIOPSY; Right     Comment:  [place] bx 1:00 area, venus marker, IMC tubular features 07/22/2021: BREAST BIOPSY; Right     Comment:  [place] bx 2:00 ribbon marker, fibroadenoma No date: CAROTID ENDARTERECTOMY; Right No date: CHOLECYSTECTOMY 09/13/2018: COLONOSCOPY WITH PROPOFOL; N/A     Comment:  Procedure: COLONOSCOPY WITH PROPOFOL;  Surgeon:               Lollie Sails, MD;  Location: [facility] ENDOSCOPY;                Service: Endoscopy;  Laterality: N/A; 05/03/2021: ESOPHAGOGASTRODUODENOSCOPY; N/A     Comment:  Procedure: ESOPHAGOGASTRODUODENOSCOPY (EGD);  Surgeon:               Lesly Rubenstein, MD;  Location: [facility] ENDOSCOPY;                Service: Endoscopy;  Laterality: N/A; No date: paranasal sinusotomy     Reproductive/Obstetrics negative OB ROS                             Anesthesia Physical  Anesthesia Plan  ASA: 3  Anesthesia Plan: General   Post-op Pain Management:    Induction: Intravenous  PONV Risk Score and Plan: 2 and Dexamethasone, Ondansetron and Treatment may vary due to age or medical condition  Airway Management Planned: LMA  Additional Equipment: None  Intra-op Plan:   Post-operative Plan: Extubation in OR  Informed Consent: I have reviewed the patients History and Physical, chart, labs and discussed the procedure including the risks, benefits and alternatives for the proposed anesthesia with the patient or authorized representative who has indicated his/her understanding and acceptance.     Dental Advisory Given  Plan Discussed with: Anesthesiologist, CRNA and Surgeon  Anesthesia Plan Comments: (Patient consented for risks of anesthesia including but  not limited to:  - adverse reactions to medications - damage to eyes, teeth, lips or other oral mucosa - nerve damage due to positioning  - sore throat or hoarseness - Damage to heart, brain, nerves, lungs, other parts of body or loss of life  Patient voiced understanding.)        Anesthesia Quick Evaluation

## 2021-08-24 NOTE — Transfer of Care (Signed)
Immediate Anesthesia Transfer of Care Note  Patient: Barbara Contreras  Procedure(s) Performed: RE-EXCISION OF BREAST CANCER,SUPERIOR MARGINS (Right: Breast)  Patient Location: PACU  Anesthesia Type:General  Level of Consciousness: drowsy  Airway & Oxygen Therapy: Patient Spontanous Breathing and Patient connected to face mask oxygen  Post-op Assessment: Report given to RN and Post -op Vital signs reviewed and stable  Post vital signs: Reviewed and stable  Last Vitals:  Vitals Value Taken Time  BP 124/66 08/24/21 1147  Temp 36.1 C 08/24/21 1130  Pulse 73 08/24/21 1158  Resp 21 08/24/21 1158  SpO2 87 % 08/24/21 1158  Vitals shown include unvalidated device data.  Last Pain:  Vitals:   08/24/21 1145  TempSrc:   PainSc: 0-No pain         Complications: No notable events documented.

## 2021-08-24 NOTE — Anesthesia Procedure Notes (Signed)
Procedure Name: LMA Insertion Date/Time: 08/24/2021 9:18 AM Performed by: Tollie Eth, CRNA Pre-anesthesia Checklist: Patient identified, Patient being monitored, Timeout performed, Emergency Drugs available and Suction available Patient Re-evaluated:Patient Re-evaluated prior to induction Oxygen Delivery Method: Circle system utilized Preoxygenation: Pre-oxygenation with 100% oxygen Induction Type: IV induction Ventilation: Mask ventilation without difficulty LMA: LMA inserted LMA Size: 3.5 Tube type: Oral Number of attempts: 1 Placement Confirmation: positive ETCO2 and breath sounds checked- equal and bilateral Tube secured with: Tape Dental Injury: Teeth and Oropharynx as per pre-operative assessment

## 2021-08-24 NOTE — Interval H&P Note (Signed)
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  The various methods of treatment have been discussed with the patient and family. After consideration of risks, benefits and other options for treatment, the patient has consented to  Procedure(s): RE-EXCISION OF BREAST CANCER,SUPERIOR MARGINS (Right) as a surgical intervention.  The patient's history has been reviewed, patient examined, no change in status, stable for surgery.  I have reviewed the patient's chart and labs.  Questions were answered to the patient's satisfaction.    Seroma noted today at [facility] site.  No erythema or discharge, so unlikely to be infected, but extremely TTP.  Will try to drain from re-excision drain site or maybe needle decompression    Harneet Noblett Lysle Pearl

## 2021-08-25 ENCOUNTER — Encounter: Payer: Self-pay | Admitting: Surgery

## 2021-08-25 ENCOUNTER — Other Ambulatory Visit: Payer: Self-pay | Admitting: Internal Medicine

## 2021-08-25 LAB — SURGICAL PATHOLOGY

## 2021-08-25 NOTE — Anesthesia Postprocedure Evaluation (Signed)
Anesthesia Post Note  Patient: Barbara Contreras  Procedure(s) Performed: RE-EXCISION OF BREAST CANCER,SUPERIOR MARGINS (Right: Breast)  Patient location during evaluation: PACU Anesthesia Type: General Level of consciousness: awake and alert Pain management: pain level controlled Vital Signs Assessment: post-procedure vital signs reviewed and stable Respiratory status: spontaneous breathing, nonlabored ventilation, respiratory function stable and patient connected to nasal cannula oxygen Cardiovascular status: blood pressure returned to baseline and stable Postop Assessment: no apparent nausea or vomiting Anesthetic complications: no   No notable events documented.   Last Vitals:  Vitals:   08/24/21 1215 08/24/21 1231  BP: (!) 124/56 127/60  Pulse: 65 70  Resp: (!) 21 18  Temp: (!) 36.2 C (!) 35.7 C  SpO2: 98% 94%    Last Pain:  Vitals:   08/25/21 0942  TempSrc:   PainSc: 0-No pain                 Arita Miss

## 2021-08-29 LAB — AEROBIC/ANAEROBIC CULTURE W GRAM STAIN (SURGICAL/DEEP WOUND): Culture: NO GROWTH

## 2021-09-05 ENCOUNTER — Telehealth: Payer: Self-pay | Admitting: Acute Care

## 2021-09-05 NOTE — Telephone Encounter (Signed)
Spoke with pt regarding lung cancer screening. Her last low dose lung CT was done in 2019. Pt had a Chest CT without contrast on 03/23/21 ordered by Dr Raul Del. Since then pt was dx with Breast cancer. I advised pt that she will not qualify for lung screening CT until she has been cancer free for 5 years. Pt verbalized understanding and will follow up with Dr Raul Del as scheduled.

## 2021-09-12 ENCOUNTER — Other Ambulatory Visit: Payer: Self-pay

## 2021-09-12 ENCOUNTER — Encounter: Payer: Self-pay | Admitting: Internal Medicine

## 2021-09-12 ENCOUNTER — Inpatient Hospital Stay: Payer: Medicare Other | Attending: Internal Medicine | Admitting: Internal Medicine

## 2021-09-12 VITALS — BP 140/67 | HR 81 | Temp 98.3°F | Resp 20 | Wt 168.0 lb

## 2021-09-12 DIAGNOSIS — E785 Hyperlipidemia, unspecified: Secondary | ICD-10-CM | POA: Diagnosis not present

## 2021-09-12 DIAGNOSIS — I10 Essential (primary) hypertension: Secondary | ICD-10-CM | POA: Insufficient documentation

## 2021-09-12 DIAGNOSIS — Z79899 Other long term (current) drug therapy: Secondary | ICD-10-CM | POA: Insufficient documentation

## 2021-09-12 DIAGNOSIS — Z7982 Long term (current) use of aspirin: Secondary | ICD-10-CM | POA: Diagnosis not present

## 2021-09-12 DIAGNOSIS — F17210 Nicotine dependence, cigarettes, uncomplicated: Secondary | ICD-10-CM | POA: Diagnosis not present

## 2021-09-12 DIAGNOSIS — Z170 Estrogen receptor positive status [ER+]: Secondary | ICD-10-CM | POA: Diagnosis not present

## 2021-09-12 DIAGNOSIS — C50411 Malignant neoplasm of upper-outer quadrant of right female breast: Secondary | ICD-10-CM | POA: Diagnosis not present

## 2021-09-12 NOTE — Progress Notes (Signed)
Patient here for oncology follow-up appointment, concerns of SOB and unsteadiness 

## 2021-09-12 NOTE — Progress Notes (Signed)
[facility] [place] NOTE  Patient Care Team: Valera Castle, MD as PCP - General (Family Medicine) Rico Junker, RN as Oncology Nurse Navigator  CHIEF COMPLAINTS/PURPOSE OF CONSULTATION: Breast cancer  #  Oncology History Overview Note  # AUG 2022[facility] -Tubular; G-1 ER > 90%; PR 51-90%; her 2=0; [Dr.Sakai]  IMPRESSION: 1. There is a highly suspicious mass in the right breast at 1 o'clock measuring 8 mm.   2. There is an indeterminate right breast mass at 2 o'clock measuring 7 mm.   3.  No evidence of right axillary lymphadenopathy.   Carcinoma of upper-outer quadrant of right breast in female, estrogen receptor positive ([place])  08/03/2021 Initial Diagnosis   Carcinoma of upper-outer quadrant of right breast in female, estrogen receptor positive ([place])      HISTORY OF PRESENTING ILLNESS: Alone.  Ambulating independently. Barbara Contreras 70 y.o.  female patient with therapy of positive stage I breast cancer is here for follow-up post lumpectomy.  No signs of infection.  No fever no chills.  Mild incisional pain well-healing.   Review of Systems  Constitutional:  Negative for chills, diaphoresis, fever, malaise/fatigue and weight loss.  HENT:  Negative for nosebleeds and sore throat.   Eyes:  Negative for double vision.  Respiratory:  Negative for cough, hemoptysis, sputum production, shortness of breath and wheezing.   Cardiovascular:  Negative for chest pain, palpitations, orthopnea and leg swelling.  Gastrointestinal:  Negative for abdominal pain, blood in stool, constipation, diarrhea, heartburn, melena, nausea and vomiting.  Genitourinary:  Negative for dysuria, frequency and urgency.  Musculoskeletal:  Positive for back pain and joint pain.  Skin: Negative.  Negative for itching and rash.  Neurological:  Negative for dizziness, tingling, focal weakness, weakness and headaches.  Endo/Heme/Allergies:  Does not bruise/bleed easily.   Psychiatric/Behavioral:  Negative for depression. The patient is not nervous/anxious and does not have insomnia.     MEDICAL HISTORY:  Past Medical History:  Diagnosis Date   Arthritis    Asthma    Back pain    Carotid artery stenosis    Carotid stenosis    Coronary artery disease    Depression    Emphysema of lung (HCC)    GERD (gastroesophageal reflux disease)    Heart murmur    Hip pain    History of Lyme disease    HLD (hyperlipidemia)    Hyperlipemia    Hypertension    IBS (irritable bowel syndrome)    IBS (irritable bowel syndrome)    Lyme disease    Pre-diabetes    Sciatica    Sciatica    Seizures (HCC)    Tremor    Vitamin D deficiency    Vitamin D deficiency     SURGICAL HISTORY: Past Surgical History:  Procedure Laterality Date   ABDOMINAL HYSTERECTOMY     BREAST BIOPSY Right 07/22/2021   [place] bx 1:00 area, venus marker, IMC tubular features   BREAST BIOPSY Right 07/22/2021   [place] bx 2:00 ribbon marker, fibroadenoma   BREAST LUMPECTOMY Right 08/24/2021   re-excision   CAROTID ENDARTERECTOMY Right    CHOLECYSTECTOMY     COLONOSCOPY WITH PROPOFOL N/A 09/13/2018   Procedure: COLONOSCOPY WITH PROPOFOL;  Surgeon: Lollie Sails, MD;  Location[facility] ENDOSCOPY;  Service: Endoscopy;  Laterality: N/A;   ESOPHAGOGASTRODUODENOSCOPY N/A 05/03/2021   Procedure: ESOPHAGOGASTRODUODENOSCOPY (EGD);  Surgeon: Lesly Rubenstein, MD;  Location: [facility] ENDOSCOPY;  Service: Endoscopy;  Laterality: N/A;   paranasal sinusotomy  PART MASTECTOMY,RADIO FREQUENCY LOCALIZER,AXILLARY SENTINEL NODE BIOPSY Right 08/12/2021   Procedure: PART MASTECTOMY,RADIO FREQUENCY LOCALIZER,AXILLARY SENTINEL NODE BIOPSY;  Surgeon: Benjamine Sprague, DO;  Location: [facility] ORS;  Service: General;  Laterality: Right;   RE-EXCISION OF BREAST CANCER,SUPERIOR MARGINS Right 08/24/2021   Procedure: RE-EXCISION OF BREAST CANCER,SUPERIOR MARGINS;  Surgeon: Benjamine Sprague, DO;  Location: [facility] ORS;  Service: General;   Laterality: Right;    SOCIAL HISTORY: Social History   Socioeconomic History   Marital status: Divorced    Spouse name: Not on file   Number of children: Not on file   Years of education: Not on file   Highest education level: Not on file  Occupational History   Not on file  Tobacco Use   Smoking status: Every Day    Packs/day: 0.50    Years: 55.00    Pack years: 27.50    Types: Cigarettes   Smokeless tobacco: Never  Vaping Use   Vaping Use: Never used  Substance and Sexual Activity   Alcohol use: Yes    Alcohol/week: 0.0 standard drinks    Comment: occasional   Drug use: Yes    Types: Marijuana   Sexual activity: Not on file  Other Topics Concern   Not on file  Social History Narrative   [place] ~25-30 mins; lives by self; no children/husband; sister near by. Smoker 1ppd/marijuana. Alcohol- beer/liqqor; retd from sales.    Social Determinants of Health   Financial Resource Strain: Not on file  Food Insecurity: Not on file  Transportation Needs: Not on file  Physical Activity: Not on file  Stress: Not on file  Social Connections: Not on file  Intimate Partner Violence: Not on file    FAMILY HISTORY: Family History  Problem Relation Age of Onset   Alcohol abuse Father    Alzheimer's disease Father    Anxiety disorder Father    Depression Father    Alcohol abuse Mother    CAD Mother    Diabetes Mother    Hyperlipidemia Mother    Depression Mother     ALLERGIES:  is allergic to morphine and related, clindamycin, penicillins, promethazine-phenylephrine, and tape.  MEDICATIONS:  Current Outpatient Medications  Medication Sig Dispense Refill   albuterol (VENTOLIN HFA) 108 (90 Base) MCG/ACT inhaler Inhale 1-2 puffs into the lungs every 6 (six) hours as needed for wheezing or shortness of breath.     amLODipine (NORVASC) 5 MG tablet Take 5 mg by mouth in the morning and at bedtime.     aspirin EC 81 MG tablet Take 81 mg by mouth daily.      atorvastatin (LIPITOR) 40 MG tablet Take 40 mg by mouth every morning.  11   baclofen (LIORESAL) 10 MG tablet Take 5-10 mg by mouth at bedtime as needed for muscle spasms.     calcium carbonate (TUMS EX) 750 MG chewable tablet Chew 1 tablet by mouth daily as needed for heartburn.     Cholecalciferol (VITAMIN D3) 1.25 MG (50000 UT) CAPS Take by mouth.     dicyclomine (BENTYL) 10 MG capsule Take 10 mg by mouth in the morning and at bedtime.     docusate sodium (COLACE) 100 MG capsule Take 100 mg by mouth 2 (two) times daily.     doxycycline (MONODOX) 100 MG capsule Take 100 mg by mouth 2 (two) times daily.     fluticasone (FLONASE) 50 MCG/ACT nasal spray Place 2 sprays into both nostrils daily as needed for allergies.  2  gabapentin (NEURONTIN) 600 MG tablet Take 600 mg by mouth as needed.  5   ibuprofen (ADVIL) 800 MG tablet Take 1 tablet (800 mg total) by mouth every 8 (eight) hours as needed for mild pain or moderate pain. 30 tablet 0   ketoconazole (NIZORAL) 2 % shampoo Apply 1 application topically once a week.     lisinopril-hydrochlorothiazide (PRINZIDE,ZESTORETIC) 20-12.5 MG tablet Take 1 tablet by mouth in the morning and at bedtime.     montelukast (SINGULAIR) 10 MG tablet Take 10 mg by mouth as needed.     Multiple Vitamin (MULTIVITAMIN WITH MINERALS) TABS tablet Take 1 tablet by mouth daily.     tiotropium (SPIRIVA) 18 MCG inhalation capsule Place 18 mcg into inhaler and inhale every morning.     traMADol (ULTRAM) 50 MG tablet Take 1 tablet (50 mg total) by mouth every 6 (six) hours as needed. 10 tablet 0   traMADol (ULTRAM) 50 MG tablet Take 1 tablet (50 mg total) by mouth every 6 (six) hours as needed. 10 tablet 0   venlafaxine XR (EFFEXOR-XR) 75 MG 24 hr capsule Take 225 mg by mouth daily with breakfast.     vitamin B-12 (CYANOCOBALAMIN) 1000 MCG tablet Take 1,000 mcg by mouth daily.     VITAMIN D, CHOLECALCIFEROL, PO Take 5,000 Units by mouth daily.     Zinc Sulfate (ZINC 15 PO)  Take 15 mg by mouth daily.     No current facility-administered medications for this visit.      Marland Kitchen  PHYSICAL EXAMINATION: ECOG PERFORMANCE STATUS: 0 - Asymptomatic  Vitals:   09/12/21 1452  BP: 140/67  Pulse: 81  Resp: 20  Temp: 98.3 F (36.8 C)  SpO2: 96%   Filed Weights   09/12/21 1452  Weight: 168 lb (76.2 kg)    Physical Exam Vitals and nursing note reviewed.  HENT:     Head: Normocephalic and atraumatic.     Mouth/Throat:     Pharynx: Oropharynx is clear.  Eyes:     Extraocular Movements: Extraocular movements intact.     Pupils: Pupils are equal, round, and reactive to light.  Cardiovascular:     Rate and Rhythm: Normal rate and regular rhythm.     Heart sounds: Murmur heard.  Pulmonary:     Comments: Decreased breath sounds bilaterally.  Abdominal:     Palpations: Abdomen is soft.  Musculoskeletal:        General: Normal range of motion.     Cervical back: Normal range of motion.  Skin:    General: Skin is warm.  Neurological:     General: No focal deficit present.     Mental Status: She is alert and oriented to person, place, and time.  Psychiatric:        Behavior: Behavior normal.        Judgment: Judgment normal.     LABORATORY DATA:  I have reviewed the data as listed Lab Results  Component Value Date   WBC 7.1 08/03/2021   HGB 15.4 (H) 08/03/2021   HCT 44.6 08/03/2021   MCV 94.7 08/03/2021   PLT 184 08/03/2021   Recent Labs    08/03/21 1504  NA 137  K 3.6  CL 100  CO2 29  GLUCOSE 107*  BUN 24*  CREATININE 0.88  CALCIUM 9.3  GFRNONAA >60  PROT 7.0  ALBUMIN 4.1  AST 30  ALT 42  ALKPHOS 56  BILITOT 0.4    RADIOGRAPHIC STUDIES: I have personally reviewed the  radiological images as listed and agreed with the findings in the report. MM Breast Surgical Specimen  Result Date: 08/24/2021 CLINICAL DATA:  Status post wire localization for reexcision EXAM: SPECIMEN RADIOGRAPH OF THE RIGHT BREAST COMPARISON:  Previous exam(s).  FINDINGS: Status post excision of the RIGHT breast. On initial specimen radiograph, the wire is noted. However the clip is not definitively identified on initial specimen radiograph. The wire tip is present. Additional surgical specimens were obtained. The clip is not identified in the surgical specimens. IMPRESSION: Specimen radiograph of the RIGHT breast. The venus clip is not identified in 3 surgical specimens. Findings were discussed with Dr. Rayetta Humphrey in the OR at time of surgery. Patient had a large postsurgical seroma which was evacuated prior to re-excision; it is possible that the clip was evacuated at that time given close proximity of the clip along the posterior and superior margin of the seroma. Seroma fluid could be radiographed to see if the clip was aspirated. Recommend attention on pathology specimen for biopsy-proven invasive malignancy given potential for clip dislodgement. A follow-up mammogram after appropriate healing time could be performed to evaluate for a residual clip. Electronically Signed   By: Valentino Saxon M.D.   On: 08/24/2021 10:28  MM RT PLC BREAST LOC DEV   1ST LESION  INC MAMMO GUIDE  Result Date: 08/24/2021 CLINICAL DATA:  Patient is status post lumpectomy with removal of the RIBBON clip with associated fibroadenoma.VENUS clip was not seen in pathology specimen. EXAM: NEEDLE LOCALIZATION OF THE RIGHT BREAST WITH MAMMO GUIDANCE COMPARISON:  Previous exams. PROCEDURE: Patient presents for needle localization prior to RIGHT breast re-excision. I met with the patient and we discussed the procedure of needle localization including benefits and alternatives. We discussed the high likelihood of a successful procedure. We discussed the risks of the procedure, including infection, bleeding, tissue injury, and further surgery. Informed, written consent was given. The usual time-out protocol was performed immediately prior to the procedure. A diagnostic RIGHT breast CC was obtained to  determine whether the VENUS clip remains. VENUS clip is noted at the posterior aspect of a RIGHT breast seroma. An attempt with a 7 cm needle was unsuccessful due to distortion of tissue secondary to large seroma. Using mammographic guidance, sterile technique, 1% lidocaine and a 9 cm modified Kopans needle, the venus clip was localized using a superior approach. The images were marked for Dr. Lysle Pearl. The venus clip is located at the proximal third of the reinforced portion of the needle. IMPRESSION: Needle localization of the RIGHT breast. No apparent complications. Electronically Signed   By: Valentino Saxon M.D.   On: 08/24/2021 08:39   ASSESSMENT & PLAN:   Carcinoma of upper-outer quadrant of right breast in female, estrogen receptor positive ([place]) # clinical stage I  ER/PR positive; her2 NEU-NEGATIVE.   # I had a long discussion with the patient in general regarding the treatment options of breast cancer including-surgery; adjuvant radiation; role of adjuvant systemic therapy including-chemotherapy antihormone therapy. Patient will likely need lumpectomy with sentinel lymph node evaluation; followed by radiation.  #Discussed the mechanism of action of aromatase inhibitors-with blocking of estrogen to prevent breast cancer.  Also discussed the potential side effects including but not limited to arthralgias hot flashes and increased risk of osteoporosis. Will presbcirb anastraolz at next visit.    # DISPOSITION: # radiation oncology referral # follow up in 6 weeks- MD; no labs- Dr.B  All questions were answered. The patient/family knows to call the clinic with any  problems, questions or concerns.    Cammie Sickle, MD 09/12/2021 4:31 PM

## 2021-09-12 NOTE — Assessment & Plan Note (Addendum)
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  I reviewed the staging pathology with the patient in detail.  Goal of treatment is cure.  #Recommend referral-Dr. Donella Stade regarding radiation.  No role for systemic chemotherapy.  # #Discussed the mechanism of action of aromatase inhibitors-with blocking of estrogen to prevent breast cancer.  Also discussed the potential side effects including but not limited to arthralgias hot flashes and increased risk of osteoporosis.  Will prescribe anastrozole at next visit. Discussed the potential risk factors for osteoporosis- age/gender/postmenopausal status/use of anti-estrogen treatments.  Recommend bone density at this time.  Will order at next visit.  # DISPOSITION: # radiation oncology referral # follow up in 6 weeks- MD; no labs- Dr.B

## 2021-09-19 ENCOUNTER — Other Ambulatory Visit: Payer: Self-pay

## 2021-09-19 ENCOUNTER — Encounter: Payer: Self-pay | Admitting: Radiation Oncology

## 2021-09-19 ENCOUNTER — Ambulatory Visit
Admission: RE | Admit: 2021-09-19 | Discharge: 2021-09-19 | Disposition: A | Discharge status: Home / Self Care | Payer: Medicare Other | Source: Ambulatory Visit | Attending: Radiation Oncology | Admitting: Radiation Oncology

## 2021-09-19 VITALS — BP 127/72 | HR 96 | Temp 97.1°F | Resp 16 | Wt 167.1 lb

## 2021-09-19 DIAGNOSIS — Z79899 Other long term (current) drug therapy: Secondary | ICD-10-CM | POA: Diagnosis not present

## 2021-09-19 DIAGNOSIS — C50411 Malignant neoplasm of upper-outer quadrant of right female breast: Secondary | ICD-10-CM | POA: Insufficient documentation

## 2021-09-19 DIAGNOSIS — I2510 Atherosclerotic heart disease of native coronary artery without angina pectoris: Secondary | ICD-10-CM | POA: Insufficient documentation

## 2021-09-19 DIAGNOSIS — I10 Essential (primary) hypertension: Secondary | ICD-10-CM | POA: Diagnosis not present

## 2021-09-19 DIAGNOSIS — Z170 Estrogen receptor positive status [ER+]: Secondary | ICD-10-CM | POA: Insufficient documentation

## 2021-09-19 DIAGNOSIS — E785 Hyperlipidemia, unspecified: Secondary | ICD-10-CM | POA: Insufficient documentation

## 2021-09-19 DIAGNOSIS — J45909 Unspecified asthma, uncomplicated: Secondary | ICD-10-CM | POA: Insufficient documentation

## 2021-09-19 DIAGNOSIS — E559 Vitamin D deficiency, unspecified: Secondary | ICD-10-CM | POA: Diagnosis not present

## 2021-09-19 DIAGNOSIS — F17210 Nicotine dependence, cigarettes, uncomplicated: Secondary | ICD-10-CM | POA: Insufficient documentation

## 2021-09-19 DIAGNOSIS — J439 Emphysema, unspecified: Secondary | ICD-10-CM | POA: Insufficient documentation

## 2021-09-19 DIAGNOSIS — M129 Arthropathy, unspecified: Secondary | ICD-10-CM | POA: Insufficient documentation

## 2021-09-19 DIAGNOSIS — R011 Cardiac murmur, unspecified: Secondary | ICD-10-CM | POA: Diagnosis not present

## 2021-09-19 DIAGNOSIS — Z7982 Long term (current) use of aspirin: Secondary | ICD-10-CM | POA: Insufficient documentation

## 2021-09-19 DIAGNOSIS — K219 Gastro-esophageal reflux disease without esophagitis: Secondary | ICD-10-CM | POA: Diagnosis not present

## 2021-09-19 NOTE — Progress Notes (Signed)
NEW PATIENT EVALUATION  Name: Barbara Contreras  MRN: [id number]  Date:   09/19/2021     DOB: 05/14/1951   This 70 y.o. female patient presents to the clinic for initial evaluation of stage Ia (T1 a N0 M0) invasive mammary carcinoma ER/PR positive HER2 negative of the right breast status post wide local excision x2.  REFERRING PHYSICIAN: Valera Castle, *  CHIEF COMPLAINT:  Chief Complaint  Patient presents with   Breast Cancer    Initial consultation    DIAGNOSIS: The encounter diagnosis was Carcinoma of upper-outer quadrant of right breast in female, estrogen receptor positive ([place]).   PREVIOUS INVESTIGATIONS:  Mammogram and ultrasound reviewed Pathology report reviewed Clinical notes reviewed  HPI: Patient is a 70 year old female who presents with an abnormal mammogram of the right breast showing a possible mass in the upper right breast at posterior depth.  This was confirmed on ultrasound at the 1 o'clock position of the right breast measuring 8 mm.  No evidence of right axillary lymphadenopathy by ultrasound was noted.  She initially underwent a wide local excision although there was no carcinoma seen and 4 lymph nodes were negative for met malignancy.  She underwent a repeat wide local excision this time showing a 3 by 2 x 2 millimeter overall grade 1 tubular carcinoma.  Margins were clear at 2 mm.  4 sentinel lymph nodes were negative.  She has done well postoperatively.  She has been seen by medical oncology based on the small size of her lesion she has not been offered systemic chemotherapy.  She is now referred to radiation oncology for opinion.  PLANNED TREATMENT REGIMEN: Right hypofractionated whole breast radiation  PAST MEDICAL HISTORY:  has a past medical history of Arthritis, Asthma, Back pain, Carotid artery stenosis, Carotid stenosis, Coronary artery disease, Depression, Emphysema of lung ([place]), GERD (gastroesophageal reflux disease), Heart murmur, Hip pain,  History of Lyme disease, HLD (hyperlipidemia), Hyperlipemia, Hypertension, IBS (irritable bowel syndrome), IBS (irritable bowel syndrome), Lyme disease, Pre-diabetes, Sciatica, Sciatica, Seizures ([place]), Tremor, Vitamin D deficiency, and Vitamin D deficiency.    PAST SURGICAL HISTORY:  Past Surgical History:  Procedure Laterality Date   ABDOMINAL HYSTERECTOMY     BREAST BIOPSY Right 07/22/2021   [place] bx 1:00 area, venus marker, IMC tubular features   BREAST BIOPSY Right 07/22/2021   [place] bx 2:00 ribbon marker, fibroadenoma   BREAST LUMPECTOMY Right 08/24/2021   re-excision   CAROTID ENDARTERECTOMY Right    CHOLECYSTECTOMY     COLONOSCOPY WITH PROPOFOL N/A 09/13/2018   Procedure: COLONOSCOPY WITH PROPOFOL;  Surgeon: Lollie Sails, MD;  Location: [facility] ENDOSCOPY;  Service: Endoscopy;  Laterality: N/A;   ESOPHAGOGASTRODUODENOSCOPY N/A 05/03/2021   Procedure: ESOPHAGOGASTRODUODENOSCOPY (EGD);  Surgeon: Lesly Rubenstein, MD;  Location: [facility] ENDOSCOPY;  Service: Endoscopy;  Laterality: N/A;   paranasal sinusotomy     PART MASTECTOMY,RADIO FREQUENCY LOCALIZER,AXILLARY SENTINEL NODE BIOPSY Right 08/12/2021   Procedure: PART MASTECTOMY,RADIO FREQUENCY LOCALIZER,AXILLARY SENTINEL NODE BIOPSY;  Surgeon: Benjamine Sprague, DO;  Location: [facility] ORS;  Service: General;  Laterality: Right;   RE-EXCISION OF BREAST CANCER,SUPERIOR MARGINS Right 08/24/2021   Procedure: RE-EXCISION OF BREAST CANCER,SUPERIOR MARGINS;  Surgeon: Benjamine Sprague, DO;  Location: [facility] ORS;  Service: General;  Laterality: Right;    FAMILY HISTORY: family history includes Alcohol abuse in her father and mother; Alzheimer's disease in her father; Anxiety disorder in her father; CAD in her mother; Depression in her father and mother; Diabetes in her mother; Hyperlipidemia in her mother.  SOCIAL HISTORY:  reports that she has been smoking cigarettes. She has a 27.50 pack-year smoking history. She has never used smokeless tobacco. She reports  current alcohol use. She reports current drug use. Drug: Marijuana.  ALLERGIES: Morphine and related, Clindamycin, Penicillins, Promethazine-phenylephrine, and Tape  MEDICATIONS:  Current Outpatient Medications  Medication Sig Dispense Refill   albuterol (VENTOLIN HFA) 108 (90 Base) MCG/ACT inhaler Inhale 1-2 puffs into the lungs every 6 (six) hours as needed for wheezing or shortness of breath.     amLODipine (NORVASC) 5 MG tablet Take 5 mg by mouth in the morning and at bedtime.     aspirin EC 81 MG tablet Take 81 mg by mouth daily.     atorvastatin (LIPITOR) 40 MG tablet Take 40 mg by mouth every morning.  11   baclofen (LIORESAL) 10 MG tablet Take 5-10 mg by mouth at bedtime as needed for muscle spasms.     calcium carbonate (TUMS EX) 750 MG chewable tablet Chew 1 tablet by mouth daily as needed for heartburn.     Cholecalciferol (VITAMIN D3) 1.25 MG (50000 UT) CAPS Take by mouth.     dicyclomine (BENTYL) 10 MG capsule Take 10 mg by mouth in the morning and at bedtime.     docusate sodium (COLACE) 100 MG capsule Take 100 mg by mouth 2 (two) times daily.     doxycycline (MONODOX) 100 MG capsule Take 100 mg by mouth 2 (two) times daily.     fluticasone (FLONASE) 50 MCG/ACT nasal spray Place 2 sprays into both nostrils daily as needed for allergies.  2   gabapentin (NEURONTIN) 600 MG tablet Take 600 mg by mouth as needed.  5   ibuprofen (ADVIL) 800 MG tablet Take 1 tablet (800 mg total) by mouth every 8 (eight) hours as needed for mild pain or moderate pain. 30 tablet 0   ketoconazole (NIZORAL) 2 % shampoo Apply 1 application topically once a week.     lisinopril-hydrochlorothiazide (PRINZIDE,ZESTORETIC) 20-12.5 MG tablet Take 1 tablet by mouth in the morning and at bedtime.     montelukast (SINGULAIR) 10 MG tablet Take 10 mg by mouth as needed.     Multiple Vitamin (MULTIVITAMIN WITH MINERALS) TABS tablet Take 1 tablet by mouth daily.     tiotropium (SPIRIVA) 18 MCG inhalation capsule Place  18 mcg into inhaler and inhale every morning.     traMADol (ULTRAM) 50 MG tablet Take 1 tablet (50 mg total) by mouth every 6 (six) hours as needed. 10 tablet 0   traMADol (ULTRAM) 50 MG tablet Take 1 tablet (50 mg total) by mouth every 6 (six) hours as needed. 10 tablet 0   venlafaxine XR (EFFEXOR-XR) 75 MG 24 hr capsule Take 225 mg by mouth daily with breakfast.     vitamin B-12 (CYANOCOBALAMIN) 1000 MCG tablet Take 1,000 mcg by mouth daily.     VITAMIN D, CHOLECALCIFEROL, PO Take 5,000 Units by mouth daily.     Zinc Sulfate (ZINC 15 PO) Take 15 mg by mouth daily.     No current facility-administered medications for this encounter.    ECOG PERFORMANCE STATUS:  0 - Asymptomatic  REVIEW OF SYSTEMS: Patient denies any weight loss, fatigue, weakness, fever, chills or night sweats. Patient denies any loss of vision, blurred vision. Patient denies any ringing  of the ears or hearing loss. No irregular heartbeat. Patient denies heart murmur or history of fainting. Patient denies any chest pain or pain radiating to her upper extremities. Patient  denies any shortness of breath, difficulty breathing at night, cough or hemoptysis. Patient denies any swelling in the lower legs. Patient denies any nausea vomiting, vomiting of blood, or coffee ground material in the vomitus. Patient denies any stomach pain. Patient states has had normal bowel movements no significant constipation or diarrhea. Patient denies any dysuria, hematuria or significant nocturia. Patient denies any problems walking, swelling in the joints or loss of balance. Patient denies any skin changes, loss of hair or loss of weight. Patient denies any excessive worrying or anxiety or significant depression. Patient denies any problems with insomnia. Patient denies excessive thirst, polyuria, polydipsia. Patient denies any swollen glands, patient denies easy bruising or easy bleeding. Patient denies any recent infections, allergies or URI. Patient "s  visual fields have not changed significantly in recent time.   PHYSICAL EXAM: BP 127/72 (BP Location: Left Arm, Patient Position: Sitting)   Pulse 96   Temp (!) 97.1 F (36.2 C) (Tympanic)   Resp 16   Wt 167 lb 1.6 oz (75.8 kg)   BMI 27.81 kg/m  She is status post wide local excision done in a horizontal fashion of the right breast with incision healing well.  No masses noted in either breast.  No axillary or supraclavicular adenopathy is noted.  Well-developed well-nourished patient in NAD. HEENT reveals PERLA, EOMI, discs not visualized.  Oral cavity is clear. No oral mucosal lesions are identified. Neck is clear without evidence of cervical or supraclavicular adenopathy. Lungs are clear to A&P. Cardiac examination is essentially unremarkable with regular rate and rhythm without murmur rub or thrill. Abdomen is benign with no organomegaly or masses noted. Motor sensory and DTR levels are equal and symmetric in the upper and lower extremities. Cranial nerves II through XII are grossly intact. Proprioception is intact. No peripheral adenopathy or edema is identified. No motor or sensory levels are noted. Crude visual fields are within normal range.  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  I also boost her scar another 1000 cGy using electron beam.  Risks and benefits of treatment occluding skin reaction fatigue alteration of blood counts possible inclusion of superficial lung all were discussed in detail with the patient.  She seems to comprehend my treatment plan well.  I have personally set up and ordered CT simulation for later  this week.  She will be a candidate for antiestrogen therapy after completion of radiation.  Patient comprehends my recommendations well.  I would like to take this opportunity to thank you for allowing me to participate in the care of your patient.Noreene Filbert, MD

## 2021-09-21 ENCOUNTER — Ambulatory Visit
Admission: RE | Admit: 2021-09-21 | Discharge: 2021-09-21 | Disposition: A | Discharge status: Home / Self Care | Payer: Medicare Other | Source: Ambulatory Visit | Attending: Radiation Oncology | Admitting: Radiation Oncology

## 2021-09-21 DIAGNOSIS — Z170 Estrogen receptor positive status [ER+]: Secondary | ICD-10-CM | POA: Diagnosis not present

## 2021-09-21 DIAGNOSIS — Z510 Encounter for antineoplastic radiation therapy: Secondary | ICD-10-CM | POA: Diagnosis present

## 2021-09-21 DIAGNOSIS — C50411 Malignant neoplasm of upper-outer quadrant of right female breast: Secondary | ICD-10-CM | POA: Diagnosis present

## 2021-09-22 ENCOUNTER — Other Ambulatory Visit: Payer: Self-pay | Admitting: *Deleted

## 2021-09-22 DIAGNOSIS — C50411 Malignant neoplasm of upper-outer quadrant of right female breast: Secondary | ICD-10-CM

## 2021-09-22 DIAGNOSIS — Z170 Estrogen receptor positive status [ER+]: Secondary | ICD-10-CM

## 2021-09-25 DIAGNOSIS — Z510 Encounter for antineoplastic radiation therapy: Secondary | ICD-10-CM | POA: Diagnosis not present

## 2021-09-28 ENCOUNTER — Ambulatory Visit: Admission: RE | Admit: 2021-09-28 | Payer: Medicare Other | Source: Ambulatory Visit

## 2021-09-28 DIAGNOSIS — Z510 Encounter for antineoplastic radiation therapy: Secondary | ICD-10-CM | POA: Diagnosis not present

## 2021-09-29 ENCOUNTER — Ambulatory Visit
Admission: RE | Admit: 2021-09-29 | Discharge: 2021-09-29 | Disposition: A | Discharge status: Home / Self Care | Payer: Medicare Other | Source: Ambulatory Visit | Attending: Radiation Oncology | Admitting: Radiation Oncology

## 2021-09-29 DIAGNOSIS — Z510 Encounter for antineoplastic radiation therapy: Secondary | ICD-10-CM | POA: Diagnosis not present

## 2021-09-30 ENCOUNTER — Ambulatory Visit
Admission: RE | Admit: 2021-09-30 | Discharge: 2021-09-30 | Disposition: A | Discharge status: Home / Self Care | Payer: Medicare Other | Source: Ambulatory Visit | Attending: Radiation Oncology | Admitting: Radiation Oncology

## 2021-09-30 DIAGNOSIS — Z510 Encounter for antineoplastic radiation therapy: Secondary | ICD-10-CM | POA: Diagnosis not present

## 2021-10-03 ENCOUNTER — Ambulatory Visit
Admission: RE | Admit: 2021-10-03 | Discharge: 2021-10-03 | Disposition: A | Discharge status: Home / Self Care | Payer: Medicare Other | Source: Ambulatory Visit | Attending: Radiation Oncology | Admitting: Radiation Oncology

## 2021-10-03 DIAGNOSIS — Z510 Encounter for antineoplastic radiation therapy: Secondary | ICD-10-CM | POA: Diagnosis not present

## 2021-10-04 ENCOUNTER — Ambulatory Visit
Admission: RE | Admit: 2021-10-04 | Discharge: 2021-10-04 | Disposition: A | Discharge status: Home / Self Care | Payer: Medicare Other | Source: Ambulatory Visit | Attending: Radiation Oncology | Admitting: Radiation Oncology

## 2021-10-04 DIAGNOSIS — Z510 Encounter for antineoplastic radiation therapy: Secondary | ICD-10-CM | POA: Insufficient documentation

## 2021-10-04 DIAGNOSIS — Z170 Estrogen receptor positive status [ER+]: Secondary | ICD-10-CM | POA: Insufficient documentation

## 2021-10-04 DIAGNOSIS — F17210 Nicotine dependence, cigarettes, uncomplicated: Secondary | ICD-10-CM | POA: Diagnosis not present

## 2021-10-04 DIAGNOSIS — C50411 Malignant neoplasm of upper-outer quadrant of right female breast: Secondary | ICD-10-CM | POA: Insufficient documentation

## 2021-10-04 DIAGNOSIS — Z79811 Long term (current) use of aromatase inhibitors: Secondary | ICD-10-CM | POA: Diagnosis not present

## 2021-10-04 DIAGNOSIS — Z7982 Long term (current) use of aspirin: Secondary | ICD-10-CM | POA: Diagnosis not present

## 2021-10-04 DIAGNOSIS — Z79899 Other long term (current) drug therapy: Secondary | ICD-10-CM | POA: Diagnosis not present

## 2021-10-05 ENCOUNTER — Ambulatory Visit
Admission: RE | Admit: 2021-10-05 | Discharge: 2021-10-05 | Disposition: A | Discharge status: Home / Self Care | Payer: Medicare Other | Source: Ambulatory Visit | Attending: Radiation Oncology | Admitting: Radiation Oncology

## 2021-10-05 DIAGNOSIS — Z510 Encounter for antineoplastic radiation therapy: Secondary | ICD-10-CM | POA: Diagnosis not present

## 2021-10-06 ENCOUNTER — Ambulatory Visit
Admission: RE | Admit: 2021-10-06 | Discharge: 2021-10-06 | Disposition: A | Discharge status: Home / Self Care | Payer: Medicare Other | Source: Ambulatory Visit | Attending: Radiation Oncology | Admitting: Radiation Oncology

## 2021-10-06 DIAGNOSIS — Z510 Encounter for antineoplastic radiation therapy: Secondary | ICD-10-CM | POA: Diagnosis not present

## 2021-10-07 ENCOUNTER — Ambulatory Visit
Admission: RE | Admit: 2021-10-07 | Discharge: 2021-10-07 | Disposition: A | Discharge status: Home / Self Care | Payer: Medicare Other | Source: Ambulatory Visit | Attending: Radiation Oncology | Admitting: Radiation Oncology

## 2021-10-07 DIAGNOSIS — Z510 Encounter for antineoplastic radiation therapy: Secondary | ICD-10-CM | POA: Diagnosis not present

## 2021-10-10 ENCOUNTER — Other Ambulatory Visit: Payer: Medicare Other

## 2021-10-10 ENCOUNTER — Ambulatory Visit
Admission: RE | Admit: 2021-10-10 | Discharge: 2021-10-10 | Disposition: A | Discharge status: Home / Self Care | Payer: Medicare Other | Source: Ambulatory Visit | Attending: Radiation Oncology | Admitting: Radiation Oncology

## 2021-10-10 DIAGNOSIS — Z510 Encounter for antineoplastic radiation therapy: Secondary | ICD-10-CM | POA: Diagnosis not present

## 2021-10-11 ENCOUNTER — Ambulatory Visit
Admission: RE | Admit: 2021-10-11 | Discharge: 2021-10-11 | Disposition: A | Discharge status: Home / Self Care | Payer: Medicare Other | Source: Ambulatory Visit | Attending: Radiation Oncology | Admitting: Radiation Oncology

## 2021-10-11 DIAGNOSIS — Z510 Encounter for antineoplastic radiation therapy: Secondary | ICD-10-CM | POA: Diagnosis not present

## 2021-10-12 ENCOUNTER — Ambulatory Visit
Admission: RE | Admit: 2021-10-12 | Discharge: 2021-10-12 | Disposition: A | Discharge status: Home / Self Care | Payer: Medicare Other | Source: Ambulatory Visit | Attending: Radiation Oncology | Admitting: Radiation Oncology

## 2021-10-12 ENCOUNTER — Other Ambulatory Visit: Payer: Medicare Other

## 2021-10-12 DIAGNOSIS — Z510 Encounter for antineoplastic radiation therapy: Secondary | ICD-10-CM | POA: Diagnosis not present

## 2021-10-12 DIAGNOSIS — C50411 Malignant neoplasm of upper-outer quadrant of right female breast: Secondary | ICD-10-CM | POA: Insufficient documentation

## 2021-10-12 DIAGNOSIS — Z79811 Long term (current) use of aromatase inhibitors: Secondary | ICD-10-CM | POA: Insufficient documentation

## 2021-10-12 DIAGNOSIS — Z79899 Other long term (current) drug therapy: Secondary | ICD-10-CM | POA: Insufficient documentation

## 2021-10-12 DIAGNOSIS — Z7982 Long term (current) use of aspirin: Secondary | ICD-10-CM | POA: Insufficient documentation

## 2021-10-12 DIAGNOSIS — F17210 Nicotine dependence, cigarettes, uncomplicated: Secondary | ICD-10-CM | POA: Insufficient documentation

## 2021-10-12 DIAGNOSIS — Z170 Estrogen receptor positive status [ER+]: Secondary | ICD-10-CM | POA: Insufficient documentation

## 2021-10-13 ENCOUNTER — Inpatient Hospital Stay: Payer: Medicare Other | Attending: Internal Medicine

## 2021-10-13 ENCOUNTER — Other Ambulatory Visit: Payer: Self-pay

## 2021-10-13 ENCOUNTER — Ambulatory Visit
Admission: RE | Admit: 2021-10-13 | Discharge: 2021-10-13 | Disposition: A | Discharge status: Home / Self Care | Payer: Medicare Other | Source: Ambulatory Visit | Attending: Radiation Oncology | Admitting: Radiation Oncology

## 2021-10-13 DIAGNOSIS — Z79899 Other long term (current) drug therapy: Secondary | ICD-10-CM | POA: Insufficient documentation

## 2021-10-13 DIAGNOSIS — C50411 Malignant neoplasm of upper-outer quadrant of right female breast: Secondary | ICD-10-CM

## 2021-10-13 DIAGNOSIS — F17210 Nicotine dependence, cigarettes, uncomplicated: Secondary | ICD-10-CM | POA: Insufficient documentation

## 2021-10-13 DIAGNOSIS — Z7982 Long term (current) use of aspirin: Secondary | ICD-10-CM | POA: Insufficient documentation

## 2021-10-13 DIAGNOSIS — Z170 Estrogen receptor positive status [ER+]: Secondary | ICD-10-CM | POA: Insufficient documentation

## 2021-10-13 DIAGNOSIS — Z510 Encounter for antineoplastic radiation therapy: Secondary | ICD-10-CM | POA: Insufficient documentation

## 2021-10-13 DIAGNOSIS — Z79811 Long term (current) use of aromatase inhibitors: Secondary | ICD-10-CM | POA: Insufficient documentation

## 2021-10-13 LAB — CBC
HCT: 46.8 % — ABNORMAL HIGH (ref 36.0–46.0)
Hemoglobin: 16 g/dL — ABNORMAL HIGH (ref 12.0–15.0)
MCH: 32 pg (ref 26.0–34.0)
MCHC: 34.2 g/dL (ref 30.0–36.0)
MCV: 93.6 fL (ref 80.0–100.0)
Platelets: 182 10*3/uL (ref 150–400)
RBC: 5 MIL/uL (ref 3.87–5.11)
RDW: 12.7 % (ref 11.5–15.5)
WBC: 5.8 10*3/uL (ref 4.0–10.5)
nRBC: 0 % (ref 0.0–0.2)

## 2021-10-14 ENCOUNTER — Ambulatory Visit
Admission: RE | Admit: 2021-10-14 | Discharge: 2021-10-14 | Disposition: A | Discharge status: Home / Self Care | Payer: Medicare Other | Source: Ambulatory Visit | Attending: Radiation Oncology | Admitting: Radiation Oncology

## 2021-10-14 DIAGNOSIS — Z510 Encounter for antineoplastic radiation therapy: Secondary | ICD-10-CM | POA: Diagnosis not present

## 2021-10-17 ENCOUNTER — Ambulatory Visit
Admission: RE | Admit: 2021-10-17 | Discharge: 2021-10-17 | Disposition: A | Discharge status: Home / Self Care | Payer: Medicare Other | Source: Ambulatory Visit | Attending: Radiation Oncology | Admitting: Radiation Oncology

## 2021-10-17 DIAGNOSIS — Z510 Encounter for antineoplastic radiation therapy: Secondary | ICD-10-CM | POA: Diagnosis not present

## 2021-10-18 ENCOUNTER — Ambulatory Visit
Admission: RE | Admit: 2021-10-18 | Discharge: 2021-10-18 | Disposition: A | Discharge status: Home / Self Care | Payer: Medicare Other | Source: Ambulatory Visit | Attending: Radiation Oncology | Admitting: Radiation Oncology

## 2021-10-18 DIAGNOSIS — Z510 Encounter for antineoplastic radiation therapy: Secondary | ICD-10-CM | POA: Diagnosis not present

## 2021-10-19 ENCOUNTER — Ambulatory Visit
Admission: RE | Admit: 2021-10-19 | Discharge: 2021-10-19 | Disposition: A | Discharge status: Home / Self Care | Payer: Medicare Other | Source: Ambulatory Visit | Attending: Radiation Oncology | Admitting: Radiation Oncology

## 2021-10-19 DIAGNOSIS — Z510 Encounter for antineoplastic radiation therapy: Secondary | ICD-10-CM | POA: Diagnosis not present

## 2021-10-20 ENCOUNTER — Ambulatory Visit
Admission: RE | Admit: 2021-10-20 | Discharge: 2021-10-20 | Disposition: A | Discharge status: Home / Self Care | Payer: Medicare Other | Source: Ambulatory Visit | Attending: Radiation Oncology | Admitting: Radiation Oncology

## 2021-10-20 DIAGNOSIS — Z510 Encounter for antineoplastic radiation therapy: Secondary | ICD-10-CM | POA: Diagnosis not present

## 2021-10-21 ENCOUNTER — Ambulatory Visit
Admission: RE | Admit: 2021-10-21 | Discharge: 2021-10-21 | Disposition: A | Discharge status: Home / Self Care | Payer: Medicare Other | Source: Ambulatory Visit | Attending: Radiation Oncology | Admitting: Radiation Oncology

## 2021-10-21 DIAGNOSIS — Z510 Encounter for antineoplastic radiation therapy: Secondary | ICD-10-CM | POA: Diagnosis not present

## 2021-10-24 ENCOUNTER — Ambulatory Visit
Admission: RE | Admit: 2021-10-24 | Discharge: 2021-10-24 | Disposition: A | Discharge status: Home / Self Care | Payer: Medicare Other | Source: Ambulatory Visit | Attending: Radiation Oncology | Admitting: Radiation Oncology

## 2021-10-24 ENCOUNTER — Other Ambulatory Visit: Payer: Self-pay

## 2021-10-24 ENCOUNTER — Inpatient Hospital Stay (HOSPITAL_BASED_OUTPATIENT_CLINIC_OR_DEPARTMENT_OTHER): Payer: Medicare Other | Admitting: Internal Medicine

## 2021-10-24 VITALS — BP 124/73 | HR 84 | Temp 97.8°F | Resp 18 | Wt 167.4 lb

## 2021-10-24 DIAGNOSIS — C50411 Malignant neoplasm of upper-outer quadrant of right female breast: Secondary | ICD-10-CM

## 2021-10-24 DIAGNOSIS — M810 Age-related osteoporosis without current pathological fracture: Secondary | ICD-10-CM | POA: Diagnosis not present

## 2021-10-24 DIAGNOSIS — Z510 Encounter for antineoplastic radiation therapy: Secondary | ICD-10-CM | POA: Diagnosis not present

## 2021-10-24 DIAGNOSIS — Z170 Estrogen receptor positive status [ER+]: Secondary | ICD-10-CM

## 2021-10-24 MED ORDER — ANASTROZOLE 1 MG PO TABS: 1.0000 mg | ORAL_TABLET | Freq: Every day | ORAL | 4 refills | Status: DC

## 2021-10-24 NOTE — Patient Instructions (Signed)
#  Start anastrozole on December 04, 2021.

## 2021-10-24 NOTE — Progress Notes (Signed)
[facility] [place] NOTE  Patient Care Team: Valera Castle, MD as PCP - General (Family Medicine) Rico Junker, RN as Oncology Nurse Navigator  CHIEF COMPLAINTS/PURPOSE OF CONSULTATION: Breast cancer  #  Oncology History Overview Note  # AUG 2022[facility] -Tubular; G-1 ER > 90%; PR 51-90%; her 2=0; [Dr.Sakai]  IMPRESSION: 1. There is a highly suspicious mass in the right breast at 1 o'clock measuring 8 mm.   2. There is an indeterminate right breast mass at 2 o'clock measuring 7 mm.   3.  No evidence of right axillary lymphadenopathy.   Carcinoma of upper-outer quadrant of right breast in female, estrogen receptor positive ([place])  08/03/2021 Initial Diagnosis   Carcinoma of upper-outer quadrant of right breast in female, estrogen receptor positive ([place])      HISTORY OF PRESENTING ILLNESS: Alone.  Ambulating independently. Daphine Deutscher 70 y.o.  female patient breast cancer ER/PR positive HER2 negative of positive stage I breast cancer currently undergoing postlumpectomy radiation is here for follow-up.  Patient tolerating radiation well except for mild to moderate fatigue.  Last day of radiation 11/28.   Review of Systems  Constitutional:  Negative for chills, diaphoresis, fever, malaise/fatigue and weight loss.  HENT:  Negative for nosebleeds and sore throat.   Eyes:  Negative for double vision.  Respiratory:  Negative for cough, hemoptysis, sputum production, shortness of breath and wheezing.   Cardiovascular:  Negative for chest pain, palpitations, orthopnea and leg swelling.  Gastrointestinal:  Negative for abdominal pain, blood in stool, constipation, diarrhea, heartburn, melena, nausea and vomiting.  Genitourinary:  Negative for dysuria, frequency and urgency.  Musculoskeletal:  Positive for back pain and joint pain.  Skin: Negative.  Negative for itching and rash.  Neurological:  Negative for dizziness, tingling, focal weakness, weakness and  headaches.  Endo/Heme/Allergies:  Does not bruise/bleed easily.  Psychiatric/Behavioral:  Negative for depression. The patient is not nervous/anxious and does not have insomnia.     MEDICAL HISTORY:  Past Medical History:  Diagnosis Date   Arthritis    Asthma    Back pain    Carotid artery stenosis    Carotid stenosis    Coronary artery disease    Depression    Emphysema of lung ([facility])    GERD (gastroesophageal reflux disease)    Heart murmur    Hip pain    History of Lyme disease    HLD (hyperlipidemia)    Hyperlipemia    Hypertension    IBS (irritable bowel syndrome)    IBS (irritable bowel syndrome)    Lyme disease    Pre-diabetes    Sciatica    Sciatica    Seizures ([facility])    Tremor    Vitamin D deficiency    Vitamin D deficiency     SURGICAL HISTORY: Past Surgical History:  Procedure Laterality Date   ABDOMINAL HYSTERECTOMY     BREAST BIOPSY Right 07/22/2021   [place] bx 1:00 area, venus marker, IMC tubular features   BREAST BIOPSY Right 07/22/2021   [place] bx 2:00 ribbon marker, fibroadenoma   BREAST LUMPECTOMY Right 08/24/2021   re-excision   CAROTID ENDARTERECTOMY Right    CHOLECYSTECTOMY     COLONOSCOPY WITH PROPOFOL N/A 09/13/2018   Procedure: COLONOSCOPY WITH PROPOFOL;  Surgeon: Lollie Sails, MD;  Location: [facility] ENDOSCOPY;  Service: Endoscopy;  Laterality: N/A;   ESOPHAGOGASTRODUODENOSCOPY N/A 05/03/2021   Procedure: ESOPHAGOGASTRODUODENOSCOPY (EGD);  Surgeon: Lesly Rubenstein, MD;  Location: [facility] ENDOSCOPY;  Service: Endoscopy;  Laterality: N/A;   paranasal sinusotomy     PART MASTECTOMY,RADIO FREQUENCY LOCALIZER,AXILLARY SENTINEL NODE BIOPSY Right 08/12/2021   Procedure: PART MASTECTOMY,RADIO FREQUENCY LOCALIZER,AXILLARY SENTINEL NODE BIOPSY;  Surgeon: Benjamine Sprague, DO;  Location: [facility] ORS;  Service: General;  Laterality: Right;   RE-EXCISION OF BREAST CANCER,SUPERIOR MARGINS Right 08/24/2021   Procedure: RE-EXCISION OF BREAST CANCER,SUPERIOR MARGINS;   Surgeon: Benjamine Sprague, DO;  Location: [facility] ORS;  Service: General;  Laterality: Right;    SOCIAL HISTORY: Social History   Socioeconomic History   Marital status: Divorced    Spouse name: Not on file   Number of children: Not on file   Years of education: Not on file   Highest education level: Not on file  Occupational History   Not on file  Tobacco Use   Smoking status: Every Day    Packs/day: 0.50    Years: 55.00    Pack years: 27.50    Types: Cigarettes   Smokeless tobacco: Never  Vaping Use   Vaping Use: Never used  Substance and Sexual Activity   Alcohol use: Yes    Alcohol/week: 0.0 standard drinks    Comment: occasional   Drug use: Yes    Types: Marijuana   Sexual activity: Not on file  Other Topics Concern   Not on file  Social History Narrative   [place] ~25-30 mins; lives by self; no children/husband; sister near by. Smoker 1ppd/marijuana. Alcohol- beer/liqqor; retd from sales.    Social Determinants of Health   Financial Resource Strain: Not on file  Food Insecurity: Not on file  Transportation Needs: Not on file  Physical Activity: Not on file  Stress: Not on file  Social Connections: Not on file  Intimate Partner Violence: Not on file    FAMILY HISTORY: Family History  Problem Relation Age of Onset   Alcohol abuse Father    Alzheimer's disease Father    Anxiety disorder Father    Depression Father    Alcohol abuse Mother    CAD Mother    Diabetes Mother    Hyperlipidemia Mother    Depression Mother     ALLERGIES:  is allergic to morphine and related, clindamycin, penicillins, promethazine-phenylephrine, and tape.  MEDICATIONS:  Current Outpatient Medications  Medication Sig Dispense Refill   albuterol (VENTOLIN HFA) 108 (90 Base) MCG/ACT inhaler Inhale 1-2 puffs into the lungs every 6 (six) hours as needed for wheezing or shortness of breath.     amLODipine (NORVASC) 5 MG tablet Take 5 mg by mouth in the morning and at bedtime.      anastrozole (ARIMIDEX) 1 MG tablet Take 1 tablet (1 mg total) by mouth daily. 30 tablet 4   aspirin EC 81 MG tablet Take 81 mg by mouth daily.     atorvastatin (LIPITOR) 40 MG tablet Take 40 mg by mouth every morning.  11   baclofen (LIORESAL) 10 MG tablet Take 5-10 mg by mouth at bedtime as needed for muscle spasms.     calcium carbonate (TUMS EX) 750 MG chewable tablet Chew 1 tablet by mouth daily as needed for heartburn.     Cholecalciferol (VITAMIN D3) 1.25 MG (50000 UT) CAPS Take by mouth.     dicyclomine (BENTYL) 10 MG capsule Take 10 mg by mouth in the morning and at bedtime.     docusate sodium (COLACE) 100 MG capsule Take 100 mg by mouth 2 (two) times daily.     fluticasone (FLONASE) 50 MCG/ACT nasal spray Place 2 sprays into  both nostrils daily as needed for allergies.  2   gabapentin (NEURONTIN) 600 MG tablet Take 600 mg by mouth as needed.  5   ibuprofen (ADVIL) 800 MG tablet Take 1 tablet (800 mg total) by mouth every 8 (eight) hours as needed for mild pain or moderate pain. 30 tablet 0   ketoconazole (NIZORAL) 2 % shampoo Apply 1 application topically once a week.     lisinopril-hydrochlorothiazide (PRINZIDE,ZESTORETIC) 20-12.5 MG tablet Take 1 tablet by mouth in the morning and at bedtime.     montelukast (SINGULAIR) 10 MG tablet Take 10 mg by mouth as needed.     Multiple Vitamin (MULTIVITAMIN WITH MINERALS) TABS tablet Take 1 tablet by mouth daily.     tiotropium (SPIRIVA) 18 MCG inhalation capsule Place 18 mcg into inhaler and inhale every morning.     traMADol (ULTRAM) 50 MG tablet Take 1 tablet (50 mg total) by mouth every 6 (six) hours as needed. 10 tablet 0   venlafaxine XR (EFFEXOR-XR) 75 MG 24 hr capsule Take 225 mg by mouth daily with breakfast.     vitamin B-12 (CYANOCOBALAMIN) 1000 MCG tablet Take 1,000 mcg by mouth daily.     VITAMIN D, CHOLECALCIFEROL, PO Take 5,000 Units by mouth daily.     Zinc Sulfate (ZINC 15 PO) Take 15 mg by mouth daily.     doxycycline  (MONODOX) 100 MG capsule Take 100 mg by mouth 2 (two) times daily. (Patient not taking: Reported on 10/24/2021)     traMADol (ULTRAM) 50 MG tablet Take 1 tablet (50 mg total) by mouth every 6 (six) hours as needed. (Patient not taking: Reported on 10/24/2021) 10 tablet 0   No current facility-administered medications for this visit.      Marland Kitchen  PHYSICAL EXAMINATION: ECOG PERFORMANCE STATUS: 0 - Asymptomatic  Vitals:   10/24/21 1556  BP: 124/73  Pulse: 84  Resp: 18  Temp: 97.8 F (36.6 C)   Filed Weights   10/24/21 1556  Weight: 167 lb 6.4 oz (75.9 kg)    Physical Exam Vitals and nursing note reviewed.  HENT:     Head: Normocephalic and atraumatic.     Mouth/Throat:     Pharynx: Oropharynx is clear.  Eyes:     Extraocular Movements: Extraocular movements intact.     Pupils: Pupils are equal, round, and reactive to light.  Cardiovascular:     Rate and Rhythm: Normal rate and regular rhythm.     Heart sounds: Murmur heard.  Pulmonary:     Comments: Decreased breath sounds bilaterally.  Abdominal:     Palpations: Abdomen is soft.  Musculoskeletal:        General: Normal range of motion.     Cervical back: Normal range of motion.  Skin:    General: Skin is warm.  Neurological:     General: No focal deficit present.     Mental Status: She is alert and oriented to person, place, and time.  Psychiatric:        Behavior: Behavior normal.        Judgment: Judgment normal.     LABORATORY DATA:  I have reviewed the data as listed Lab Results  Component Value Date   WBC 5.8 10/13/2021   HGB 16.0 (H) 10/13/2021   HCT 46.8 (H) 10/13/2021   MCV 93.6 10/13/2021   PLT 182 10/13/2021   Recent Labs    08/03/21 1504  NA 137  K 3.6  CL 100  CO2 29  GLUCOSE  107*  BUN 24*  CREATININE 0.88  CALCIUM 9.3  GFRNONAA >60  PROT 7.0  ALBUMIN 4.1  AST 30  ALT 42  ALKPHOS 56  BILITOT 0.4    RADIOGRAPHIC STUDIES: I have personally reviewed the radiological images as  listed and agreed with the findings in the report. No results found.  ASSESSMENT & PLAN:   Carcinoma of upper-outer quadrant of right breast in female, estrogen receptor positive ([place]) #Stage I pT1a grade 1- ER/PR positive; her2 NEU-NEGATIVE.  Patient currently undergoing postlumpectomy radiation; 11/28 last RT.   #I reviewed the role of adjuvant aromatase inhibitor.  I again discussed the mechanism of action of aromatase inhibitors-with blocking of estrogen to prevent breast cancer.  Also discussed the potential side effects including but not limited to arthralgias hot flashes and increased risk of osteoporosis.  We will get a bone density test prior to next visit.  Ordered today.  #Ordered anastrozole; patient started on December 04, 2021.  # DISPOSITION: # follow up in middle of FEB 2023- MD; BMD- Dr.B  All questions were answered. The patient/family knows to call the clinic with any problems, questions or concerns.    Cammie Sickle, MD 10/24/2021 4:30 PM

## 2021-10-24 NOTE — Assessment & Plan Note (Addendum)
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  Patient currently undergoing postlumpectomy radiation; 11/28 last RT.   #I reviewed the role of adjuvant aromatase inhibitor.  I again discussed the mechanism of action of aromatase inhibitors-with blocking of estrogen to prevent breast cancer.  Also discussed the potential side effects including but not limited to arthralgias hot flashes and increased risk of osteoporosis.  We will get a bone density test prior to next visit.  Ordered today.  #Ordered anastrozole; patient started on December 04, 2021.  # DISPOSITION: # follow up in middle of FEB 2023- MD; BMD- Dr.B

## 2021-10-24 NOTE — Progress Notes (Signed)
Patient denies new problems/concerns today.   °

## 2021-10-25 ENCOUNTER — Ambulatory Visit
Admission: RE | Admit: 2021-10-25 | Discharge: 2021-10-25 | Disposition: A | Discharge status: Home / Self Care | Payer: Medicare Other | Source: Ambulatory Visit | Attending: Radiation Oncology | Admitting: Radiation Oncology

## 2021-10-25 DIAGNOSIS — Z510 Encounter for antineoplastic radiation therapy: Secondary | ICD-10-CM | POA: Diagnosis not present

## 2021-10-26 ENCOUNTER — Ambulatory Visit
Admission: RE | Admit: 2021-10-26 | Discharge: 2021-10-26 | Disposition: A | Discharge status: Home / Self Care | Payer: Medicare Other | Source: Ambulatory Visit | Attending: Radiation Oncology | Admitting: Radiation Oncology

## 2021-10-26 DIAGNOSIS — Z510 Encounter for antineoplastic radiation therapy: Secondary | ICD-10-CM | POA: Diagnosis not present

## 2021-10-31 ENCOUNTER — Ambulatory Visit
Admission: RE | Admit: 2021-10-31 | Discharge: 2021-10-31 | Disposition: A | Discharge status: Home / Self Care | Payer: Medicare Other | Source: Ambulatory Visit | Attending: Radiation Oncology | Admitting: Radiation Oncology

## 2021-10-31 DIAGNOSIS — Z510 Encounter for antineoplastic radiation therapy: Secondary | ICD-10-CM | POA: Diagnosis not present

## 2021-12-07 ENCOUNTER — Ambulatory Visit: Payer: Medicare Other | Admitting: Radiation Oncology

## 2022-01-11 ENCOUNTER — Other Ambulatory Visit: Payer: Self-pay

## 2022-01-11 ENCOUNTER — Ambulatory Visit
Admission: RE | Admit: 2022-01-11 | Discharge: 2022-01-11 | Disposition: A | Discharge status: Home / Self Care | Payer: Medicare Other | Source: Ambulatory Visit | Attending: Internal Medicine | Admitting: Internal Medicine

## 2022-01-11 DIAGNOSIS — M810 Age-related osteoporosis without current pathological fracture: Secondary | ICD-10-CM | POA: Diagnosis present

## 2022-01-17 ENCOUNTER — Other Ambulatory Visit: Payer: Self-pay

## 2022-01-17 ENCOUNTER — Ambulatory Visit
Admission: RE | Admit: 2022-01-17 | Discharge: 2022-01-17 | Disposition: A | Discharge status: Home / Self Care | Payer: Medicare Other | Source: Ambulatory Visit | Attending: Family Medicine | Admitting: Family Medicine

## 2022-01-17 ENCOUNTER — Other Ambulatory Visit: Payer: Self-pay | Admitting: Family Medicine

## 2022-01-17 ENCOUNTER — Ambulatory Visit
Admission: RE | Admit: 2022-01-17 | Discharge: 2022-01-17 | Disposition: A | Discharge status: Home / Self Care | Payer: Medicare Other | Attending: Family Medicine | Admitting: Family Medicine

## 2022-01-17 DIAGNOSIS — M25571 Pain in right ankle and joints of right foot: Secondary | ICD-10-CM

## 2022-01-18 ENCOUNTER — Inpatient Hospital Stay: Payer: Medicare Other | Attending: Internal Medicine | Admitting: Internal Medicine

## 2022-01-23 ENCOUNTER — Telehealth: Payer: Self-pay | Admitting: *Deleted

## 2022-01-23 NOTE — Telephone Encounter (Signed)
Patient needs to reschedule appointment. ?

## 2022-01-24 NOTE — Telephone Encounter (Signed)
Have tried calling pt 2 times LVM for her to call us back gave direct number

## 2022-02-08 ENCOUNTER — Ambulatory Visit: Payer: Medicare Other | Admitting: Radiation Oncology

## 2022-02-09 ENCOUNTER — Ambulatory Visit: Payer: Medicare Other | Admitting: Radiation Oncology

## 2022-02-15 ENCOUNTER — Other Ambulatory Visit: Payer: Self-pay | Admitting: Internal Medicine

## 2022-02-15 ENCOUNTER — Other Ambulatory Visit: Payer: Self-pay

## 2022-02-15 ENCOUNTER — Ambulatory Visit
Admission: RE | Admit: 2022-02-15 | Discharge: 2022-02-15 | Disposition: A | Discharge status: Home / Self Care | Payer: Medicare Other | Source: Ambulatory Visit | Attending: Radiation Oncology | Admitting: Radiation Oncology

## 2022-02-15 ENCOUNTER — Encounter: Payer: Self-pay | Admitting: Radiation Oncology

## 2022-02-15 ENCOUNTER — Other Ambulatory Visit: Payer: Self-pay | Admitting: *Deleted

## 2022-02-15 VITALS — BP 136/72 | HR 91 | Temp 97.6°F | Resp 18 | Ht 65.5 in | Wt 165.8 lb

## 2022-02-15 DIAGNOSIS — Z170 Estrogen receptor positive status [ER+]: Secondary | ICD-10-CM | POA: Insufficient documentation

## 2022-02-15 DIAGNOSIS — C50411 Malignant neoplasm of upper-outer quadrant of right female breast: Secondary | ICD-10-CM | POA: Insufficient documentation

## 2022-02-15 DIAGNOSIS — Z923 Personal history of irradiation: Secondary | ICD-10-CM | POA: Diagnosis not present

## 2022-02-15 DIAGNOSIS — Z79811 Long term (current) use of aromatase inhibitors: Secondary | ICD-10-CM | POA: Insufficient documentation

## 2022-02-15 MED ORDER — ANASTROZOLE 1 MG PO TABS: 1.0000 mg | ORAL_TABLET | Freq: Every day | ORAL | 1 refills | Status: DC

## 2022-02-15 NOTE — Telephone Encounter (Signed)
Patient last seen 10/24/21 with f/u plan follow up in middle of FEB 2023- MD; BMD ? ?Patient had BMD on 01/11/22 but NS for MD appt on 01/18/22. ? ?Colette, pease contact patient to have NS rescheduled.   ?

## 2022-02-15 NOTE — Progress Notes (Signed)
Radiation Oncology ?Follow up Note ? ?Name: Barbara Contreras   ?Date:   02/15/2022 ?MRN:  [id number] ?DOB: 04-Mar-1951  ? ? ?This 71 y.o. female presents to the clinic today for 84-monthfollow-up status post whole breast radiation to her right breast for stage Ia (T1 a N0 M0) invasive mammary carcinoma ER/PR positive. ? ?REFERRING PROVIDER: OValera Castle * ? ?HPI: Patient is a 71year old female now 3 months having completed whole breast radiation to her right breast for ER/PR positive stage Ia invasive mammary carcinoma seen today in routine follow-up she is been having some problems with balance and dizziness.  She is seeing a neurologist for that.  She is also was started on Aromasin although her prescription ran out we are contacting Dr. B to have that refilled.  She specifically denies breast tenderness cough or bone pain.. ? ?COMPLICATIONS OF TREATMENT: none ? ?FOLLOW UP COMPLIANCE: keeps appointments  ? ?PHYSICAL EXAM:  ?BP 136/72   Pulse 91   Temp 97.6 ?F (36.4 ?C)   Resp 18   Ht 5' 5.5" (1.664 m)   Wt 165 lb 12.8 oz (75.2 kg)   BMI 27.17 kg/m?  ?Lungs are clear to A&P cardiac examination essentially unremarkable with regular rate and rhythm. No dominant mass or nodularity is noted in either breast in 2 positions examined. Incision is well-healed. No axillary or supraclavicular adenopathy is appreciated. Cosmetic result is excellent.  Well-developed well-nourished patient in NAD. HEENT reveals PERLA, EOMI, discs not visualized.  Oral cavity is clear. No oral mucosal lesions are identified. Neck is clear without evidence of cervical or supraclavicular adenopathy. Lungs are clear to A&P. Cardiac examination is essentially unremarkable with regular rate and rhythm without murmur rub or thrill. Abdomen is benign with no organomegaly or masses noted. Motor sensory and DTR levels are equal and symmetric in the upper and lower extremities. Cranial nerves II through XII are grossly intact. Proprioception  is intact. No peripheral adenopathy or edema is identified. No motor or sensory levels are noted. Crude visual fields are within normal range. ? ?RADIOLOGY RESULTS: No current films for review ? ?PLAN: Present time patient is doing well 3 months out from whole breast radiation and pleased with her overall progress.  We will have her Aromasin refilled.  I have asked to see her back in 6 months for follow-up.  Patient knows to call with any concerns. ? ?I would like to take this opportunity to thank you for allowing me to participate in the care of your patient.. ?  ? GNoreene Filbert MD ? ?

## 2022-02-15 NOTE — Progress Notes (Signed)
Survivorship Care Plan visit completed.  Treatment summary reviewed and given to patient.  ASCO answers booklet reviewed and given to patient.  CARE program and Cancer Transitions discussed with patient along with other resources cancer center offers to patients and caregivers.  Patient verbalized understanding.    

## 2022-02-16 ENCOUNTER — Telehealth: Payer: Self-pay | Admitting: Internal Medicine

## 2022-02-16 NOTE — Telephone Encounter (Signed)
Please have the patient follow-up-in the next 1 to 2 months.  Looks like she was a no-show last month.  ? ?MD; labs- cbc/cmp ?

## 2022-02-17 ENCOUNTER — Other Ambulatory Visit: Payer: Self-pay

## 2022-02-17 ENCOUNTER — Telehealth: Payer: Self-pay | Admitting: Internal Medicine

## 2022-02-17 DIAGNOSIS — C50411 Malignant neoplasm of upper-outer quadrant of right female breast: Secondary | ICD-10-CM

## 2022-02-17 NOTE — Telephone Encounter (Signed)
Per scheduling:  Have tried to call pt 2xs to try and get her rescheduled for an appt. Will call again next week. ?

## 2022-02-17 NOTE — Telephone Encounter (Signed)
Have tried to call pt 2xs to try and get her rescheduled for an appt. Will call again next week. ?

## 2022-03-16 ENCOUNTER — Other Ambulatory Visit: Payer: Self-pay | Admitting: Internal Medicine

## 2022-04-03 ENCOUNTER — Other Ambulatory Visit: Payer: Self-pay | Admitting: Internal Medicine

## 2022-04-12 ENCOUNTER — Other Ambulatory Visit: Payer: Self-pay | Admitting: Nephrology

## 2022-04-12 DIAGNOSIS — N182 Chronic kidney disease, stage 2 (mild): Secondary | ICD-10-CM

## 2022-04-12 DIAGNOSIS — N179 Acute kidney failure, unspecified: Secondary | ICD-10-CM

## 2022-04-26 ENCOUNTER — Other Ambulatory Visit: Payer: Self-pay | Admitting: Internal Medicine

## 2022-04-26 MED ORDER — ANASTROZOLE 1 MG PO TABS: 1.0000 mg | ORAL_TABLET | Freq: Every day | ORAL | 0 refills | Status: DC

## 2022-05-20 ENCOUNTER — Other Ambulatory Visit: Payer: Self-pay | Admitting: Internal Medicine

## 2022-06-15 ENCOUNTER — Other Ambulatory Visit: Payer: Self-pay | Admitting: Internal Medicine

## 2022-06-16 ENCOUNTER — Other Ambulatory Visit: Payer: Self-pay | Admitting: *Deleted

## 2022-06-16 DIAGNOSIS — Z87891 Personal history of nicotine dependence: Secondary | ICD-10-CM

## 2022-06-16 DIAGNOSIS — Z122 Encounter for screening for malignant neoplasm of respiratory organs: Secondary | ICD-10-CM

## 2022-06-16 DIAGNOSIS — F17210 Nicotine dependence, cigarettes, uncomplicated: Secondary | ICD-10-CM

## 2022-06-19 ENCOUNTER — Other Ambulatory Visit: Payer: Self-pay | Admitting: *Deleted

## 2022-06-19 NOTE — Telephone Encounter (Signed)
Patient called requesting refill for anastrozole. Order pended per MD approval.

## 2022-06-20 ENCOUNTER — Telehealth: Payer: Self-pay

## 2022-06-20 MED ORDER — ANASTROZOLE 1 MG PO TABS: 1.0000 mg | ORAL_TABLET | Freq: Every day | ORAL | 0 refills | Status: DC

## 2022-06-20 NOTE — Telephone Encounter (Signed)
Rx was refused on 06/15/22:  Reason for Refusal:  Patient needs an appointment  Reason for Refusal Comment:  Please have patient call office to schedule an appt. She did not show    NS 01/2022 MD appt Still no appt scheduled with Dr. Jacinto Reap

## 2022-06-20 NOTE — Telephone Encounter (Signed)
Patient informed that Dr. Jacinto Reap sent 30 day supply of Anastrozole to pharmacy.  She needs to schedule f/u with MD due to 01/2022 NS appt.    Request sent to scheduling to contact pt to r/s.

## 2022-06-20 NOTE — Telephone Encounter (Signed)
Patient informed that Dr. Jacinto Reap sent 30 day supply of Anastrozole to pharmacy.  She needs to schedule f/u with MD due to 01/2022 NS appt.    Please contact pt to r/s.

## 2022-06-27 ENCOUNTER — Ambulatory Visit
Admission: RE | Admit: 2022-06-27 | Discharge: 2022-06-27 | Disposition: A | Discharge status: Home / Self Care | Payer: Medicare Other | Source: Ambulatory Visit | Attending: Acute Care | Admitting: Acute Care

## 2022-06-27 DIAGNOSIS — Z122 Encounter for screening for malignant neoplasm of respiratory organs: Secondary | ICD-10-CM | POA: Insufficient documentation

## 2022-06-27 DIAGNOSIS — Z87891 Personal history of nicotine dependence: Secondary | ICD-10-CM | POA: Diagnosis present

## 2022-06-27 DIAGNOSIS — F17210 Nicotine dependence, cigarettes, uncomplicated: Secondary | ICD-10-CM | POA: Insufficient documentation

## 2022-06-29 ENCOUNTER — Other Ambulatory Visit: Payer: Self-pay | Admitting: Acute Care

## 2022-06-29 DIAGNOSIS — F17210 Nicotine dependence, cigarettes, uncomplicated: Secondary | ICD-10-CM

## 2022-06-29 DIAGNOSIS — Z87891 Personal history of nicotine dependence: Secondary | ICD-10-CM

## 2022-06-29 DIAGNOSIS — Z122 Encounter for screening for malignant neoplasm of respiratory organs: Secondary | ICD-10-CM

## 2022-07-13 ENCOUNTER — Other Ambulatory Visit: Payer: Self-pay | Admitting: Internal Medicine

## 2022-07-14 ENCOUNTER — Inpatient Hospital Stay: Payer: Medicare Other | Admitting: Internal Medicine

## 2022-07-14 ENCOUNTER — Inpatient Hospital Stay: Payer: Medicare Other | Attending: Internal Medicine | Admitting: Nurse Practitioner

## 2022-07-14 ENCOUNTER — Other Ambulatory Visit: Payer: Self-pay

## 2022-07-14 VITALS — BP 98/76 | HR 78 | Temp 98.6°F | Resp 16 | Wt 160.0 lb

## 2022-07-14 DIAGNOSIS — Z170 Estrogen receptor positive status [ER+]: Secondary | ICD-10-CM

## 2022-07-14 DIAGNOSIS — N644 Mastodynia: Secondary | ICD-10-CM | POA: Diagnosis not present

## 2022-07-14 DIAGNOSIS — C50411 Malignant neoplasm of upper-outer quadrant of right female breast: Secondary | ICD-10-CM | POA: Diagnosis present

## 2022-07-14 DIAGNOSIS — M8580 Other specified disorders of bone density and structure, unspecified site: Secondary | ICD-10-CM | POA: Diagnosis not present

## 2022-07-14 DIAGNOSIS — Z853 Personal history of malignant neoplasm of breast: Secondary | ICD-10-CM

## 2022-07-14 DIAGNOSIS — Z5181 Encounter for therapeutic drug level monitoring: Secondary | ICD-10-CM | POA: Diagnosis not present

## 2022-07-14 DIAGNOSIS — Z79811 Long term (current) use of aromatase inhibitors: Secondary | ICD-10-CM | POA: Diagnosis not present

## 2022-07-14 DIAGNOSIS — Z08 Encounter for follow-up examination after completed treatment for malignant neoplasm: Secondary | ICD-10-CM

## 2022-07-14 DIAGNOSIS — Z720 Tobacco use: Secondary | ICD-10-CM | POA: Diagnosis not present

## 2022-07-14 NOTE — Progress Notes (Signed)
Returns for follow-up of breast cancer. She reports that she had an intense shooting pain in her right breast today that radiated down into her abdomen. She was planning to call 911 if pain did not resolve, however, pain resolved within 5 minutes.

## 2022-07-14 NOTE — Progress Notes (Signed)
[facility] NOTE  Patient Care Team: Kym Groom Guy Begin, MD as PCP - General (Family Medicine) Rico Junker, RN as Oncology Nurse Navigator Cammie Sickle, MD as Consulting Physician (Oncology) Noreene Filbert, MD as Consulting Physician (Radiation Oncology) Erby Pian, MD as Referring Physician (Pulmonary Disease) Benjamine Sprague, DO as Consulting Physician (Surgery) Corey Skains, MD as Consulting Physician (Cardiology)  CHIEF COMPLAINTS/PURPOSE OF CONSULTATION: Breast cancer  #  Oncology History Overview Note  # AUG 2022[facility]; G-1 ER > 90%; PR 51-90%; her 2=0; [Dr.Sakai]  IMPRESSION: 1. There is a highly suspicious mass in the right breast at 1 o'clock measuring 8 mm.   2. There is an indeterminate right breast mass at 2 o'clock measuring 7 mm.   3.  No evidence of right axillary lymphadenopathy.   Carcinoma of upper-outer quadrant of right breast in female, estrogen receptor positive ([place])  08/03/2021 Initial Diagnosis   Carcinoma of upper-outer quadrant of right breast in female, estrogen receptor positive ([place])     HISTORY OF PRESENTING ILLNESS: Alone.  Ambulating independently. Daphine Deutscher 71 y.o. female patient with history of right breast cancer for stage Ia ER/PR positive, her2/neu negative, s/p lumpectomy and whole breast radiation who returns to clinic for follow up.   She completed radiation 10/31/21. Currently on anastrozole. Reports an episode of intense shooting pain in her right breast earlier today. Described as sharp. Resolved in 5 minutes. No shortness of breath or prior episodes. No lumps or masses. Feels at baseline otherwise.   Review of Systems  Constitutional:  Negative for chills, diaphoresis, fever, malaise/fatigue and weight loss.  HENT:  Negative for nosebleeds and sore throat.   Eyes:  Negative for double vision.  Respiratory:  Negative for cough, hemoptysis, sputum production, shortness  of breath and wheezing.   Cardiovascular:  Positive for chest pain (breast pain per hpi). Negative for palpitations, orthopnea and leg swelling.  Gastrointestinal:  Negative for abdominal pain, blood in stool, constipation, diarrhea, heartburn, melena, nausea and vomiting.  Genitourinary:  Negative for dysuria, frequency and urgency.  Musculoskeletal:  Positive for back pain and joint pain.  Skin:  Negative for itching and rash.  Neurological:  Negative for dizziness, tingling, focal weakness, weakness and headaches.  Endo/Heme/Allergies:  Does not bruise/bleed easily.  Psychiatric/Behavioral:  Negative for depression. The patient is not nervous/anxious and does not have insomnia.     MEDICAL HISTORY:  Past Medical History:  Diagnosis Date   Arthritis    Asthma    Back pain    Carotid artery stenosis    Carotid stenosis    Coronary artery disease    Depression    Emphysema of lung (HCC)    GERD (gastroesophageal reflux disease)    Heart murmur    Hip pain    History of Lyme disease    HLD (hyperlipidemia)    Hyperlipemia    Hypertension    IBS (irritable bowel syndrome)    IBS (irritable bowel syndrome)    Lyme disease    Pre-diabetes    Sciatica    Sciatica    Seizures (HCC)    Tremor    Vitamin D deficiency    Vitamin D deficiency    SURGICAL HISTORY: Past Surgical History:  Procedure Laterality Date   ABDOMINAL HYSTERECTOMY     BREAST BIOPSY Right 07/22/2021   [place] bx 1:00 area, venus marker, IMC tubular features   BREAST BIOPSY Right 07/22/2021   [place] bx 2:00  ribbon marker, fibroadenoma   BREAST LUMPECTOMY Right 08/24/2021   re-excision   CAROTID ENDARTERECTOMY Right    CHOLECYSTECTOMY     COLONOSCOPY WITH PROPOFOL N/A 09/13/2018   Procedure: COLONOSCOPY WITH PROPOFOL;  Surgeon: Lollie Sails, MD;  Location: [facility];  Service: Endoscopy;  Laterality: N/A;   ESOPHAGOGASTRODUODENOSCOPY N/A 05/03/2021   Procedure: ESOPHAGOGASTRODUODENOSCOPY (EGD);   Surgeon: Lesly Rubenstein, MD;  Location: [facility] ENDOSCOPY;  Service: Endoscopy;  Laterality: N/A;   paranasal sinusotomy     PART MASTECTOMY,RADIO FREQUENCY LOCALIZER,AXILLARY SENTINEL NODE BIOPSY Right 08/12/2021   Procedure: PART MASTECTOMY,RADIO FREQUENCY LOCALIZER,AXILLARY SENTINEL NODE BIOPSY;  Surgeon: Benjamine Sprague, DO;  Location: [facility] ORS;  Service: General;  Laterality: Right;   RE-EXCISION OF BREAST CANCER,SUPERIOR MARGINS Right 08/24/2021   Procedure: RE-EXCISION OF BREAST CANCER,SUPERIOR MARGINS;  Surgeon: Benjamine Sprague, DO;  Location: [facility] ORS;  Service: General;  Laterality: Right;   SOCIAL HISTORY: Social History   Socioeconomic History   Marital status: Divorced    Spouse name: Not on file   Number of children: Not on file   Years of education: Not on file   Highest education level: Not on file  Occupational History   Not on file  Tobacco Use   Smoking status: Every Day    Packs/day: 0.50    Years: 55.00    Total pack years: 27.50    Types: Cigarettes   Smokeless tobacco: Never  Vaping Use   Vaping Use: Never used  Substance and Sexual Activity   Alcohol use: Yes    Alcohol/week: 0.0 standard drinks of alcohol    Comment: occasional   Drug use: Yes    Types: Marijuana   Sexual activity: Not on file  Other Topics Concern   Not on file  Social History Narrative   [place] ~25-30 mins; lives by self; no children/husband; sister near by. Smoker 1ppd/marijuana. Alcohol- beer/liqqor; retd from sales.    Social Determinants of Health   Financial Resource Strain: Not on file  Food Insecurity: Not on file  Transportation Needs: Not on file  Physical Activity: Not on file  Stress: Not on file  Social Connections: Not on file  Intimate Partner Violence: Not on file   FAMILY HISTORY: Family History  Problem Relation Age of Onset   Alcohol abuse Father    Alzheimer's disease Father    Anxiety disorder Father    Depression Father    Alcohol abuse Mother     CAD Mother    Diabetes Mother    Hyperlipidemia Mother    Depression Mother    ALLERGIES:  is allergic to morphine and related, clindamycin, penicillins, promethazine-phenylephrine, and tape.  MEDICATIONS:  Current Outpatient Medications  Medication Sig Dispense Refill   albuterol (VENTOLIN HFA) 108 (90 Base) MCG/ACT inhaler Inhale 1-2 puffs into the lungs every 6 (six) hours as needed for wheezing or shortness of breath.     amLODipine (NORVASC) 5 MG tablet Take 5 mg by mouth in the morning and at bedtime.     anastrozole (ARIMIDEX) 1 MG tablet Take 1 tablet (1 mg total) by mouth daily. 30 tablet 0   aspirin EC 81 MG tablet Take 81 mg by mouth daily.     atorvastatin (LIPITOR) 40 MG tablet Take 40 mg by mouth every morning.  11   baclofen (LIORESAL) 10 MG tablet Take 5-10 mg by mouth at bedtime as needed for muscle spasms.     calcium carbonate (TUMS EX) 750 MG chewable tablet Chew 1  tablet by mouth daily as needed for heartburn.     Cholecalciferol (VITAMIN D3) 1.25 MG (50000 UT) CAPS Take by mouth.     dicyclomine (BENTYL) 10 MG capsule Take 10 mg by mouth in the morning and at bedtime.     docusate sodium (COLACE) 100 MG capsule Take 100 mg by mouth 2 (two) times daily.     fluticasone (FLONASE) 50 MCG/ACT nasal spray Place 2 sprays into both nostrils daily as needed for allergies.  2   gabapentin (NEURONTIN) 600 MG tablet Take 600 mg by mouth as needed.  5   ibuprofen (ADVIL) 800 MG tablet Take 1 tablet (800 mg total) by mouth every 8 (eight) hours as needed for mild pain or moderate pain. 30 tablet 0   ketoconazole (NIZORAL) 2 % shampoo Apply 1 application topically once a week.     lisinopril-hydrochlorothiazide (PRINZIDE,ZESTORETIC) 20-12.5 MG tablet Take 1 tablet by mouth in the morning and at bedtime.     Multiple Vitamin (MULTIVITAMIN WITH MINERALS) TABS tablet Take 1 tablet by mouth daily.     tiotropium (SPIRIVA) 18 MCG inhalation capsule Place 18 mcg into inhaler and inhale  every morning.     traMADol (ULTRAM) 50 MG tablet Take 1 tablet (50 mg total) by mouth every 6 (six) hours as needed. 10 tablet 0   venlafaxine XR (EFFEXOR-XR) 75 MG 24 hr capsule Take 225 mg by mouth daily with breakfast.     vitamin B-12 (CYANOCOBALAMIN) 1000 MCG tablet Take 1,000 mcg by mouth daily.     VITAMIN D, CHOLECALCIFEROL, PO Take 5,000 Units by mouth daily.     Zinc Sulfate (ZINC 15 PO) Take 15 mg by mouth daily.     doxycycline (MONODOX) 100 MG capsule Take 100 mg by mouth 2 (two) times daily. (Patient not taking: Reported on 10/24/2021)     montelukast (SINGULAIR) 10 MG tablet Take 10 mg by mouth as needed.     traMADol (ULTRAM) 50 MG tablet Take 1 tablet (50 mg total) by mouth every 6 (six) hours as needed. (Patient not taking: Reported on 10/24/2021) 10 tablet 0   No current facility-administered medications for this visit.    PHYSICAL EXAMINATION: ECOG PERFORMANCE STATUS: 1 - Symptomatic but completely ambulatory  Vitals:   07/14/22 1508  BP: 98/76  Pulse: 78  Resp: 16  Temp: 98.6 F (37 C)  SpO2: 97%   Filed Weights   07/14/22 1508  Weight: 160 lb (72.6 kg)   Physical Exam Vitals and nursing note reviewed.  HENT:     Head: Normocephalic and atraumatic.     Mouth/Throat:     Pharynx: Oropharynx is clear.  Eyes:     Extraocular Movements: Extraocular movements intact.     Pupils: Pupils are equal, round, and reactive to light.  Cardiovascular:     Rate and Rhythm: Normal rate and regular rhythm.     Heart sounds: Murmur heard.  Pulmonary:     Comments: Decreased breath sounds bilaterally.  Abdominal:     Palpations: Abdomen is soft.  Musculoskeletal:        General: Normal range of motion.     Cervical back: Normal range of motion.  Skin:    General: Skin is warm.  Neurological:     General: No focal deficit present.     Mental Status: She is alert and oriented to person, place, and time.  Psychiatric:        Behavior: Behavior normal.  Judgment: Judgment normal.   Breast exam was performed in seated and lying down position. Patient is status post right lumpectomy with a well-healed surgical scar. No evidence of any palpable masses. No evidence of axillary adenopathy. No evidence of any palpable masses or lumps in the left breast. No evidence of left axillary adenopathy   LABORATORY DATA:  I have reviewed the data as listed Lab Results  Component Value Date   WBC 5.8 10/13/2021   HGB 16.0 (H) 10/13/2021   HCT 46.8 (H) 10/13/2021   MCV 93.6 10/13/2021   PLT 182 10/13/2021   Recent Labs    08/03/21 1504  NA 137  K 3.6  CL 100  CO2 29  GLUCOSE 107*  BUN 24*  CREATININE 0.88  CALCIUM 9.3  GFRNONAA >60  PROT 7.0  ALBUMIN 4.1  AST 30  ALT 42  ALKPHOS 56  BILITOT 0.4     RADIOGRAPHIC STUDIES: I have personally reviewed the radiological images as listed and agreed with the findings in the report. CT CHEST LUNG CA SCREEN LOW DOSE W/O CM  Result Date: 06/28/2022 CLINICAL DATA:  71 year old female current smoker with 44 pack-year history of smoking. Lung cancer screening examination. EXAM: CT CHEST WITHOUT CONTRAST LOW-DOSE FOR LUNG CANCER SCREENING TECHNIQUE: Multidetector CT imaging of the chest was performed following the standard protocol without IV contrast. RADIATION DOSE REDUCTION: This exam was performed according to the departmental dose-optimization program which includes automated exposure control, adjustment of the mA and/or kV according to patient size and/or use of iterative reconstruction technique. COMPARISON:  Low-dose lung cancer screening chest CT 08/01/2018. Chest CT 03/23/2021. FINDINGS: Cardiovascular: Heart size is normal. There is no significant pericardial fluid, thickening or pericardial calcification. There is aortic atherosclerosis, as well as atherosclerosis of the great vessels of the mediastinum and the coronary arteries, including calcified atherosclerotic plaque in the left circumflex  and right coronary arteries. Calcifications of the aortic valve and mitral annulus. Mediastinum/Nodes: No pathologically enlarged mediastinal or hilar lymph nodes. Please note that accurate exclusion of hilar adenopathy is limited on noncontrast CT scans. Esophagus is unremarkable in appearance. No axillary lymphadenopathy. Lungs/Pleura: Multiple small pulmonary nodules are noted in the lungs bilaterally, predominantly calcified granulomas. A noncalcified pulmonary nodules also noted in the superior segment of the left lower lobe (axial image 158), with a volume derived mean diameter of 3.3 mm. No larger more suspicious appearing pulmonary nodules or masses are noted. No acute consolidative airspace disease. No pleural effusions. Mild diffuse bronchial wall thickening with very mild centrilobular and paraseptal emphysema. Upper Abdomen: Aortic atherosclerosis. 2.2 x 2.2 cm low-attenuation (1 Hounsfield unit) left adrenal nodule, similar to the prior study, compatible with a benign adenoma. Status post cholecystectomy. Colonic diverticulosis. Musculoskeletal: Architectural distortion in the deep soft tissues of the right breast where there is a focal low-attenuation fluid collection measuring 2.1 cm in diameter (axial image 16 of series 2), likely a small postoperative seroma at site of prior lumpectomy. There are no aggressive appearing lytic or blastic lesions noted in the visualized portions of the skeleton. IMPRESSION: 1. Lung-RADS 2S, benign appearance or behavior. Continue annual screening with low-dose chest CT without contrast in 12 months. 2. The "S" modifier above refers to potentially clinically significant non lung cancer related findings. Specifically, there is aortic atherosclerosis, in addition to two-vessel coronary artery disease. Please note that although the presence of coronary artery calcium documents the presence of coronary artery disease, the severity of this disease and any potential stenosis  cannot be assessed on this non-gated CT examination. Assessment for potential risk factor modification, dietary therapy or pharmacologic therapy may be warranted, if clinically indicated. 3. Mild diffuse bronchial wall thickening with very mild centrilobular and paraseptal emphysema; imaging findings suggestive of underlying COPD. 4. Additional incidental findings, as above. Aortic Atherosclerosis (ICD10-I70.0) and Emphysema (ICD10-J43.9). Electronically Signed   By: Vinnie Langton M.D.   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 Given ER/PR positive tumor AI was recommended and she started anastrozole 12/04/21. Tolerating well. Given acute symptoms, plan for diagnostic mammogram (see below). MM was reviewed and was reported without evidence of malignancy in either breast. Plan to repeat diagnostic mammogram in 08/2023. Based on NCCN surveillance guidelines she will follow up with physical exams every 3-6 months for the first 2 years. Survivorship topics and lifestyle topics reviewed. Continue anastrozole.  Osteopenia- baseline bone density 01/11/22 was reported as T score -2.2 consistent with osteopenia. Start calcium 1200 mg and vitamin d 1000 iu daily along with weight bearing exercise as tolerated. Plan to repeat in 01/2023. If worse consider fosamax vs others.  Breast pain- new problem. will order diagnostic mammogram to evaluate further. Imaging was negative for malignancy and symptoms have resolved. Suspect may have been post radiation or post surgical changes. If recurrent symptoms, consider further workup including posisble cardiac etiology vs ct chest.  Tobacco use- patient has previously been followed by low dose ct screening program. Last scan was 2022 with Dr. Raul Del. Recommended that she follow up with Dr. Raul Del or pcp for  scheduling of surveillance scan as well as other health maintenance and cancer screenings.   No problem-specific Assessment & Plan notes found for this encounter.  Disposition: Mammogram asap 3 mo- [place]  All questions were answered. The patient/family knows to call the clinic with any problems, questions or concerns.    Verlon Au, NP 07/14/2022

## 2022-08-04 ENCOUNTER — Ambulatory Visit
Admission: RE | Admit: 2022-08-04 | Discharge: 2022-08-04 | Disposition: A | Discharge status: Home / Self Care | Payer: Medicare Other | Source: Ambulatory Visit | Attending: Nurse Practitioner | Admitting: Nurse Practitioner

## 2022-08-04 DIAGNOSIS — C50411 Malignant neoplasm of upper-outer quadrant of right female breast: Secondary | ICD-10-CM

## 2022-08-04 DIAGNOSIS — Z170 Estrogen receptor positive status [ER+]: Secondary | ICD-10-CM | POA: Insufficient documentation

## 2022-08-24 ENCOUNTER — Ambulatory Visit
Admission: RE | Admit: 2022-08-24 | Discharge: 2022-08-24 | Disposition: A | Discharge status: Home / Self Care | Payer: Medicare Other | Source: Ambulatory Visit | Attending: Radiation Oncology | Admitting: Radiation Oncology

## 2022-08-24 VITALS — BP 114/68 | HR 89 | Temp 98.3°F | Resp 16 | Ht 65.0 in | Wt 162.4 lb

## 2022-08-24 DIAGNOSIS — C50411 Malignant neoplasm of upper-outer quadrant of right female breast: Secondary | ICD-10-CM | POA: Diagnosis not present

## 2022-08-24 DIAGNOSIS — Z79811 Long term (current) use of aromatase inhibitors: Secondary | ICD-10-CM | POA: Diagnosis not present

## 2022-08-24 DIAGNOSIS — Z923 Personal history of irradiation: Secondary | ICD-10-CM | POA: Insufficient documentation

## 2022-08-24 DIAGNOSIS — Z170 Estrogen receptor positive status [ER+]: Secondary | ICD-10-CM | POA: Insufficient documentation

## 2022-08-24 NOTE — Progress Notes (Signed)
Radiation Oncology Follow up Note  Name: Barbara Contreras   Date:   08/24/2022 MRN:  [id number] DOB: [birth year]    This 71 y.o. female presents to the clinic today for 29-monthfollow-up status post whole breast radiation to her right breast for stage Ia (T1 a N0 M0) ER/PR positive invasive mammary carcinoma.  REFERRING PROVIDER: OValera Castle *  HPI: Patient is a 71year old female now about 9 months having completed whole breast radiation to her right breast for ER/PR positive stage I invasive mammary carcinoma.  Seen today in routine follow-up she is doing well.  She specifically denies breast tenderness cough or bone pain..  She had recent mammograms which I have reviewed were BI-RADS 2 benign.  She is currently on Arimidex tolerating that well without side effect.  COMPLICATIONS OF TREATMENT: none  FOLLOW UP COMPLIANCE: keeps appointments   PHYSICAL EXAM:  BP 114/68   Pulse 89   Temp 98.3 F (36.8 C)   Resp 16   Ht '5\' 5"'$  (1.651 m)   Wt 162 lb 6.4 oz (73.7 kg)   BMI 27.02 kg/m  Lungs are clear to A&P cardiac examination essentially unremarkable with regular rate and rhythm. No dominant mass or nodularity is noted in either breast in 2 positions examined. Incision is well-healed. No axillary or supraclavicular adenopathy is appreciated. Cosmetic result is excellent.  Well-developed well-nourished patient in NAD. HEENT reveals PERLA, EOMI, discs not visualized.  Oral cavity is clear. No oral mucosal lesions are identified. Neck is clear without evidence of cervical or supraclavicular adenopathy. Lungs are clear to A&P. Cardiac examination is essentially unremarkable with regular rate and rhythm without murmur rub or thrill. Abdomen is benign with no organomegaly or masses noted. Motor sensory and DTR levels are equal and symmetric in the upper and lower extremities. Cranial nerves II through XII are grossly intact. Proprioception is intact. No peripheral adenopathy or edema is  identified. No motor or sensory levels are noted. Crude visual fields are within normal range.  RADIOLOGY RESULTS: Mammograms reviewed compatible with above-stated findings  PLAN: Present time patient is doing well 9 months out from whole breast radiation with no evidence of disease.  I have asked to see her back in 1 year for follow-up.  She continues on Arimidex without side effect.  Patient is to call with any concerns.  I would like to take this opportunity to thank you for allowing me to participate in the care of your patient..Noreene Filbert MD

## 2022-08-30 DIAGNOSIS — M8580 Other specified disorders of bone density and structure, unspecified site: Secondary | ICD-10-CM | POA: Insufficient documentation

## 2022-09-19 ENCOUNTER — Encounter (INDEPENDENT_AMBULATORY_CARE_PROVIDER_SITE_OTHER): Payer: Medicare Other | Admitting: Vascular Surgery

## 2022-09-22 ENCOUNTER — Other Ambulatory Visit: Payer: Self-pay | Admitting: Internal Medicine

## 2022-10-01 ENCOUNTER — Observation Stay: Payer: Medicare Other

## 2022-10-01 ENCOUNTER — Emergency Department: Payer: Medicare Other

## 2022-10-01 ENCOUNTER — Observation Stay
Admission: RE | Admit: 2022-10-01 | Discharge: 2022-10-01 | Disposition: A | Discharge status: Home / Self Care | Payer: Medicare Other | Source: Ambulatory Visit | Attending: Emergency Medicine | Admitting: Emergency Medicine

## 2022-10-01 ENCOUNTER — Observation Stay
Admission: EM | Admit: 2022-10-01 | Discharge: 2022-10-03 | Disposition: A | Discharge status: Home Health | Payer: Medicare Other | Attending: Internal Medicine | Admitting: Internal Medicine

## 2022-10-01 ENCOUNTER — Other Ambulatory Visit: Payer: Self-pay

## 2022-10-01 ENCOUNTER — Encounter: Payer: Self-pay | Admitting: Emergency Medicine

## 2022-10-01 DIAGNOSIS — F32A Depression, unspecified: Secondary | ICD-10-CM | POA: Insufficient documentation

## 2022-10-01 DIAGNOSIS — Z23 Encounter for immunization: Secondary | ICD-10-CM | POA: Insufficient documentation

## 2022-10-01 DIAGNOSIS — R55 Syncope and collapse: Secondary | ICD-10-CM | POA: Diagnosis present

## 2022-10-01 DIAGNOSIS — F17210 Nicotine dependence, cigarettes, uncomplicated: Secondary | ICD-10-CM | POA: Insufficient documentation

## 2022-10-01 DIAGNOSIS — R2689 Other abnormalities of gait and mobility: Secondary | ICD-10-CM | POA: Diagnosis not present

## 2022-10-01 DIAGNOSIS — G8929 Other chronic pain: Secondary | ICD-10-CM | POA: Insufficient documentation

## 2022-10-01 DIAGNOSIS — K589 Irritable bowel syndrome without diarrhea: Secondary | ICD-10-CM | POA: Insufficient documentation

## 2022-10-01 DIAGNOSIS — R2681 Unsteadiness on feet: Secondary | ICD-10-CM | POA: Insufficient documentation

## 2022-10-01 DIAGNOSIS — S32019A Unspecified fracture of first lumbar vertebra, initial encounter for closed fracture: Secondary | ICD-10-CM | POA: Diagnosis present

## 2022-10-01 DIAGNOSIS — I6522 Occlusion and stenosis of left carotid artery: Secondary | ICD-10-CM | POA: Insufficient documentation

## 2022-10-01 DIAGNOSIS — R4182 Altered mental status, unspecified: Secondary | ICD-10-CM

## 2022-10-01 DIAGNOSIS — S32028A Other fracture of second lumbar vertebra, initial encounter for closed fracture: Secondary | ICD-10-CM

## 2022-10-01 DIAGNOSIS — I2510 Atherosclerotic heart disease of native coronary artery without angina pectoris: Secondary | ICD-10-CM | POA: Insufficient documentation

## 2022-10-01 DIAGNOSIS — E512 Wernicke's encephalopathy: Secondary | ICD-10-CM

## 2022-10-01 DIAGNOSIS — S2241XA Multiple fractures of ribs, right side, initial encounter for closed fracture: Secondary | ICD-10-CM

## 2022-10-01 DIAGNOSIS — R296 Repeated falls: Secondary | ICD-10-CM

## 2022-10-01 DIAGNOSIS — F1010 Alcohol abuse, uncomplicated: Secondary | ICD-10-CM | POA: Diagnosis not present

## 2022-10-01 DIAGNOSIS — S2231XA Fracture of one rib, right side, initial encounter for closed fracture: Secondary | ICD-10-CM | POA: Diagnosis not present

## 2022-10-01 DIAGNOSIS — W19XXXA Unspecified fall, initial encounter: Secondary | ICD-10-CM | POA: Diagnosis present

## 2022-10-01 DIAGNOSIS — R7989 Other specified abnormal findings of blood chemistry: Secondary | ICD-10-CM | POA: Diagnosis present

## 2022-10-01 DIAGNOSIS — E878 Other disorders of electrolyte and fluid balance, not elsewhere classified: Secondary | ICD-10-CM | POA: Insufficient documentation

## 2022-10-01 DIAGNOSIS — S32029A Unspecified fracture of second lumbar vertebra, initial encounter for closed fracture: Secondary | ICD-10-CM | POA: Diagnosis not present

## 2022-10-01 DIAGNOSIS — M6281 Muscle weakness (generalized): Secondary | ICD-10-CM | POA: Diagnosis not present

## 2022-10-01 DIAGNOSIS — I10 Essential (primary) hypertension: Secondary | ICD-10-CM | POA: Diagnosis present

## 2022-10-01 DIAGNOSIS — F1090 Alcohol use, unspecified, uncomplicated: Secondary | ICD-10-CM | POA: Diagnosis present

## 2022-10-01 DIAGNOSIS — E119 Type 2 diabetes mellitus without complications: Secondary | ICD-10-CM | POA: Diagnosis not present

## 2022-10-01 DIAGNOSIS — F17200 Nicotine dependence, unspecified, uncomplicated: Secondary | ICD-10-CM | POA: Diagnosis present

## 2022-10-01 DIAGNOSIS — E559 Vitamin D deficiency, unspecified: Secondary | ICD-10-CM | POA: Diagnosis not present

## 2022-10-01 DIAGNOSIS — R569 Unspecified convulsions: Secondary | ICD-10-CM | POA: Insufficient documentation

## 2022-10-01 DIAGNOSIS — N179 Acute kidney failure, unspecified: Secondary | ICD-10-CM | POA: Diagnosis not present

## 2022-10-01 LAB — URINALYSIS, ROUTINE W REFLEX MICROSCOPIC
Bilirubin Urine: NEGATIVE
Glucose, UA: NEGATIVE mg/dL
Hgb urine dipstick: NEGATIVE
Ketones, ur: 5 mg/dL — AB
Leukocytes,Ua: NEGATIVE
Nitrite: NEGATIVE
Protein, ur: NEGATIVE mg/dL
Specific Gravity, Urine: 1.017 (ref 1.005–1.030)
pH: 5 (ref 5.0–8.0)

## 2022-10-01 LAB — URINE DRUG SCREEN, QUALITATIVE (ARMC ONLY)
Amphetamines, Ur Screen: NOT DETECTED
Barbiturates, Ur Screen: NOT DETECTED
Benzodiazepine, Ur Scrn: NOT DETECTED
Cannabinoid 50 Ng, Ur ~~LOC~~: POSITIVE — AB
Cocaine Metabolite,Ur ~~LOC~~: NOT DETECTED
MDMA (Ecstasy)Ur Screen: NOT DETECTED
Methadone Scn, Ur: NOT DETECTED
Opiate, Ur Screen: NOT DETECTED
Phencyclidine (PCP) Ur S: NOT DETECTED
Tricyclic, Ur Screen: NOT DETECTED

## 2022-10-01 LAB — CBC WITH DIFFERENTIAL/PLATELET
Abs Immature Granulocytes: 0.03 10*3/uL (ref 0.00–0.07)
Basophils Absolute: 0.1 10*3/uL (ref 0.0–0.1)
Basophils Relative: 1 %
Eosinophils Absolute: 0.2 10*3/uL (ref 0.0–0.5)
Eosinophils Relative: 2 %
HCT: 44.6 % (ref 36.0–46.0)
Hemoglobin: 14.7 g/dL (ref 12.0–15.0)
Immature Granulocytes: 0 %
Lymphocytes Relative: 17 %
Lymphs Abs: 1.5 10*3/uL (ref 0.7–4.0)
MCH: 30.5 pg (ref 26.0–34.0)
MCHC: 33 g/dL (ref 30.0–36.0)
MCV: 92.5 fL (ref 80.0–100.0)
Monocytes Absolute: 0.6 10*3/uL (ref 0.1–1.0)
Monocytes Relative: 6 %
Neutro Abs: 6.5 10*3/uL (ref 1.7–7.7)
Neutrophils Relative %: 74 %
Platelets: 173 10*3/uL (ref 150–400)
RBC: 4.82 MIL/uL (ref 3.87–5.11)
RDW: 13.7 % (ref 11.5–15.5)
WBC: 8.9 10*3/uL (ref 4.0–10.5)
nRBC: 0 % (ref 0.0–0.2)

## 2022-10-01 LAB — TROPONIN I (HIGH SENSITIVITY)
Troponin I (High Sensitivity): 54 ng/L — ABNORMAL HIGH (ref <18)
Troponin I (High Sensitivity): 41 ng/L — ABNORMAL HIGH (ref <18)

## 2022-10-01 LAB — COMPREHENSIVE METABOLIC PANEL
ALT: 33 U/L (ref 0–44)
AST: 54 U/L — ABNORMAL HIGH (ref 15–41)
Albumin: 4.1 g/dL (ref 3.5–5.0)
Alkaline Phosphatase: 67 U/L (ref 38–126)
Anion gap: 12 (ref 5–15)
BUN: 42 mg/dL — ABNORMAL HIGH (ref 8–23)
CO2: 28 mmol/L (ref 22–32)
Calcium: 9.4 mg/dL (ref 8.9–10.3)
Chloride: 101 mmol/L (ref 98–111)
Creatinine, Ser: 1.33 mg/dL — ABNORMAL HIGH (ref 0.44–1.00)
GFR, Estimated: 43 mL/min — ABNORMAL LOW (ref >60)
Glucose, Bld: 96 mg/dL (ref 70–99)
Potassium: 3.8 mmol/L (ref 3.5–5.1)
Sodium: 141 mmol/L (ref 135–145)
Total Bilirubin: 1.4 mg/dL — ABNORMAL HIGH (ref 0.3–1.2)
Total Protein: 7.5 g/dL (ref 6.5–8.1)

## 2022-10-01 LAB — MAGNESIUM: Magnesium: 2.3 mg/dL (ref 1.7–2.4)

## 2022-10-01 LAB — ETHANOL: Alcohol, Ethyl (B): <10 mg/dL (ref <10)

## 2022-10-01 MED ORDER — HEPARIN SODIUM (PORCINE) 5000 UNIT/ML IJ SOLN
5000.0000 [IU] | Freq: Two times a day (BID) | INTRAMUSCULAR | Status: DC
  Administered 2022-10-01 – 2022-10-03 (×4): 5000 [IU] via SUBCUTANEOUS
  Filled 2022-10-01 (×4): qty 1

## 2022-10-01 MED ORDER — TIOTROPIUM BROMIDE MONOHYDRATE 18 MCG IN CAPS
18.0000 ug | ORAL_CAPSULE | RESPIRATORY_TRACT | Status: DC
  Administered 2022-10-02 – 2022-10-03 (×2): 18 ug via RESPIRATORY_TRACT
  Filled 2022-10-01: qty 5

## 2022-10-01 MED ORDER — VENLAFAXINE HCL ER 75 MG PO CP24: 225.0000 mg | ORAL_CAPSULE | Freq: Every day | ORAL | Status: DC

## 2022-10-01 MED ORDER — ACETAMINOPHEN 650 MG RE SUPP: 650.0000 mg | Freq: Four times a day (QID) | RECTAL | Status: DC | PRN

## 2022-10-01 MED ORDER — VENLAFAXINE HCL ER 75 MG PO CP24
75.0000 mg | ORAL_CAPSULE | Freq: Three times a day (TID) | ORAL | Status: DC
  Administered 2022-10-02 – 2022-10-03 (×4): 75 mg via ORAL
  Filled 2022-10-01 (×6): qty 1

## 2022-10-01 MED ORDER — FOLIC ACID 1 MG PO TABS
1.0000 mg | ORAL_TABLET | Freq: Every day | ORAL | Status: DC
  Administered 2022-10-02 – 2022-10-03 (×3): 1 mg via ORAL
  Filled 2022-10-01 (×3): qty 1

## 2022-10-01 MED ORDER — PANTOPRAZOLE SODIUM 40 MG IV SOLR
40.0000 mg | Freq: Two times a day (BID) | INTRAVENOUS | Status: DC
  Administered 2022-10-02 – 2022-10-03 (×4): 40 mg via INTRAVENOUS
  Filled 2022-10-01 (×4): qty 10

## 2022-10-01 MED ORDER — LACTATED RINGERS IV SOLN: INTRAVENOUS | Status: DC

## 2022-10-01 MED ORDER — AMLODIPINE BESYLATE 5 MG PO TABS: 10.0000 mg | ORAL_TABLET | Freq: Every day | ORAL | Status: DC

## 2022-10-01 MED ORDER — THIAMINE MONONITRATE 100 MG PO TABS: 100.0000 mg | ORAL_TABLET | Freq: Every day | ORAL | Status: DC

## 2022-10-01 MED ORDER — SODIUM CHLORIDE 0.9% FLUSH
3.0000 mL | Freq: Two times a day (BID) | INTRAVENOUS | Status: DC
  Administered 2022-10-02: 3 mL via INTRAVENOUS

## 2022-10-01 MED ORDER — ALBUTEROL SULFATE (2.5 MG/3ML) 0.083% IN NEBU: 3.0000 mL | INHALATION_SOLUTION | Freq: Four times a day (QID) | RESPIRATORY_TRACT | Status: DC | PRN

## 2022-10-01 MED ORDER — LORAZEPAM 2 MG/ML IJ SOLN: 1.0000 mg | INTRAMUSCULAR | Status: DC | PRN

## 2022-10-01 MED ORDER — ASPIRIN 81 MG PO TBEC
81.0000 mg | DELAYED_RELEASE_TABLET | Freq: Every day | ORAL | Status: DC
  Administered 2022-10-02 – 2022-10-03 (×2): 81 mg via ORAL
  Filled 2022-10-01 (×2): qty 1

## 2022-10-01 MED ORDER — ANASTROZOLE 1 MG PO TABS
1.0000 mg | ORAL_TABLET | Freq: Every day | ORAL | Status: DC
  Administered 2022-10-02 – 2022-10-03 (×2): 1 mg via ORAL
  Filled 2022-10-01 (×2): qty 1

## 2022-10-01 MED ORDER — ACETAMINOPHEN 325 MG PO TABS
650.0000 mg | ORAL_TABLET | Freq: Four times a day (QID) | ORAL | Status: DC | PRN
  Administered 2022-10-02 (×2): 650 mg via ORAL
  Filled 2022-10-01 (×2): qty 2

## 2022-10-01 MED ORDER — AMLODIPINE BESYLATE 5 MG PO TABS
5.0000 mg | ORAL_TABLET | Freq: Two times a day (BID) | ORAL | Status: DC
  Administered 2022-10-02 – 2022-10-03 (×3): 5 mg via ORAL
  Filled 2022-10-01 (×3): qty 1

## 2022-10-01 MED ORDER — THIAMINE HCL 100 MG/ML IJ SOLN
100.0000 mg | Freq: Every day | INTRAMUSCULAR | Status: DC
  Filled 2022-10-01: qty 2

## 2022-10-01 MED ORDER — SODIUM CHLORIDE 0.9 % IV SOLN: INTRAVENOUS | Status: DC

## 2022-10-01 MED ORDER — LORAZEPAM 1 MG PO TABS
1.0000 mg | ORAL_TABLET | ORAL | Status: DC | PRN
  Administered 2022-10-01: 2 mg via ORAL
  Administered 2022-10-03: 1 mg via ORAL
  Filled 2022-10-01: qty 2
  Filled 2022-10-01: qty 1

## 2022-10-01 MED ORDER — ADULT MULTIVITAMIN W/MINERALS CH
1.0000 | ORAL_TABLET | Freq: Every day | ORAL | Status: DC
  Administered 2022-10-02 – 2022-10-03 (×3): 1 via ORAL
  Filled 2022-10-01 (×3): qty 1

## 2022-10-01 MED ORDER — IOHEXOL 300 MG/ML  SOLN
80.0000 mL | Freq: Once | INTRAMUSCULAR | Status: AC | PRN
  Administered 2022-10-01: 80 mL via INTRAVENOUS

## 2022-10-01 MED ORDER — THIAMINE HCL 100 MG/ML IJ SOLN
500.0000 mg | Freq: Once | INTRAMUSCULAR | Status: AC
  Administered 2022-10-01: 500 mg via INTRAVENOUS
  Filled 2022-10-01: qty 6

## 2022-10-01 MED ORDER — NICOTINE 21 MG/24HR TD PT24
21.0000 mg | MEDICATED_PATCH | Freq: Every day | TRANSDERMAL | Status: DC
  Administered 2022-10-02 – 2022-10-03 (×3): 21 mg via TRANSDERMAL
  Filled 2022-10-01 (×3): qty 1

## 2022-10-01 NOTE — ED Provider Notes (Signed)
----------------------------------------- 8:01 PM on 10/01/2022 -----------------------------------------  Blood pressure 115/63, pulse 67, temperature 97.8 F (36.6 C), temperature source Oral, resp. rate 18, SpO2 97 %.  Assuming care from Dr. Charna Archer.  In short, Barbara Contreras is a 71 y.o. female with a chief complaint of Fall and Back Pain .  Refer to the original H&P for additional details.  Patient reportedly brought to the emergency department for multiple falls and confusion at home.  Patient drinks daily and is a poor historian, unable to recall the details of her falls.  She reports that she has right-sided chest and flank pain, and was found to have a large area of ecchymosis.   The current plan of care is to await CT chest, abdomen, pelvis, and admit to hospitalist. CT scan reveals rib fractures of 9 and 10, as well as transverse process fractures.  Patient declined analgesia.  ____________________________________________    ED Results / Procedures / Treatments   Labs (all labs ordered are listed, but only abnormal results are displayed) Labs Reviewed  COMPREHENSIVE METABOLIC PANEL - Abnormal; Notable for the following components:      Result Value   BUN 42 (*)    Creatinine, Ser 1.33 (*)    AST 54 (*)    Total Bilirubin 1.4 (*)    GFR, Estimated 43 (*)    All other components within normal limits  URINALYSIS, ROUTINE W REFLEX MICROSCOPIC - Abnormal; Notable for the following components:   Color, Urine YELLOW (*)    APPearance CLEAR (*)    Ketones, ur 5 (*)    All other components within normal limits  URINE DRUG SCREEN, QUALITATIVE ([facility] ONLY) - Abnormal; Notable for the following components:   Cannabinoid 50 Ng, Ur [place] POSITIVE (*)    All other components within normal limits  TROPONIN I (HIGH SENSITIVITY) - Abnormal; Notable for the following components:   Troponin I (High Sensitivity) 54 (*)    All other components within normal limits  TROPONIN I (HIGH  SENSITIVITY) - Abnormal; Notable for the following components:   Troponin I (High Sensitivity) 41 (*)    All other components within normal limits  CBC WITH DIFFERENTIAL/PLATELET  ETHANOL  MAGNESIUM  CK  CBC  COMPREHENSIVE METABOLIC PANEL  CBC  MAGNESIUM  PHOSPHORUS     EKG     RADIOLOGY I personally viewed and evaluated these images as part of my medical decision making, as well as reviewing the written report by the radiologist.  ED Provider Interpretation:   CT CHEST ABDOMEN PELVIS W CONTRAST  Result Date: 10/01/2022 CLINICAL DATA:  Trauma. EXAM: CT CHEST, ABDOMEN, AND PELVIS WITH CONTRAST TECHNIQUE: Multidetector CT imaging of the chest, abdomen and pelvis was performed following the standard protocol during bolus administration of intravenous contrast. RADIATION DOSE REDUCTION: This exam was performed according to the departmental dose-optimization program which includes automated exposure control, adjustment of the mA and/or kV according to patient size and/or use of iterative reconstruction technique. CONTRAST:  60m OMNIPAQUE IOHEXOL 300 MG/ML  SOLN COMPARISON:  Chest CT dated 06/27/2022. FINDINGS: CT CHEST FINDINGS Cardiovascular: There is no cardiomegaly or pericardial effusion there is coronary vascular calcification and calcification of the mitral annulus. Moderate atherosclerotic calcification of the thoracic aorta. No aneurysmal dilatation or dissection. The origins of the great vessels of the aortic arch and the central pulmonary arteries appear patent. Mediastinum/Nodes: No hilar or mediastinal adenopathy. The esophagus is grossly unremarkable. No mediastinal fluid collection. Lungs/Pleura: Bibasilar linear atelectasis/scarring. There is a 3  mm left upper lobe calcified granuloma. No focal consolidation or pneumothorax. Trace right pleural effusion may be present. Musculoskeletal: Nondisplaced fracture of the posterior right eleventh rib at the costovertebral articulation  as well as fracture of the posterior right eleventh rib. Displaced fracture of the posterior right tenth rib. CT ABDOMEN PELVIS FINDINGS No intra-abdominal free air or free fluid. Hepatobiliary: Mild fatty liver. No biliary dilatation. Cholecystectomy. No retained calcified stone noted in the central CBD. Pancreas: Unremarkable. No pancreatic ductal dilatation or surrounding inflammatory changes. Spleen: Normal in size without focal abnormality. Adrenals/Urinary Tract: The right adrenal gland is unremarkable. Indeterminate 2 cm left adrenal nodule as seen on the prior CT as adenoma. No follow-up. There is no hydronephrosis on either side. There is symmetric enhancement and excretion of contrast by both kidneys. The visualized ureters and urinary bladder appear unremarkable. Stomach/Bowel: There is severe sigmoid diverticulosis and scattered colonic diverticula without active inflammatory changes. There is no bowel obstruction or active inflammation. The appendix is normal. Vascular/Lymphatic: Advanced aortoiliac atherosclerotic disease. The IVC is unremarkable. No portal venous gas. There is no adenopathy. Reproductive: Hysterectomy.  No adnexal masses. Other: None Musculoskeletal: Minimally displaced fractures of the right L1 and L2 transverse processes. There is degenerative changes of the spine and hips. No acute osseous pathology. Grade 1 L4-L5 anterolisthesis. IMPRESSION: 1. Fractures of the posterior right eleventh and tenth ribs. No pneumothorax. 2. Minimally displaced fractures of the right L1 and L2 transverse processes. 3. Colonic diverticulosis. No bowel obstruction. Normal appendix. 4.  Aortic Atherosclerosis (ICD10-I70.0). Electronically Signed   By: Anner Crete M.D.   On: 10/01/2022 19:29   CT Head Wo Contrast  Result Date: 10/01/2022 CLINICAL DATA:  Trauma, fall EXAM: CT HEAD WITHOUT CONTRAST TECHNIQUE: Contiguous axial images were obtained from the base of the skull through the vertex  without intravenous contrast. RADIATION DOSE REDUCTION: This exam was performed according to the departmental dose-optimization program which includes automated exposure control, adjustment of the mA and/or kV according to patient size and/or use of iterative reconstruction technique. COMPARISON:  06/16/2017 FINDINGS: Brain: No acute intracranial findings are seen. There are no signs of bleeding within the cranium. Ventricles are not dilated. Cortical sulci are prominent. Vascular: Unremarkable. Skull: Unremarkable. Sinuses/Orbits: There is mucosal thickening in ethmoid and left maxillary sinuses. Other: None. IMPRESSION: No acute intracranial findings are seen in noncontrast CT brain. Atrophy. Chronic sinusitis. Electronically Signed   By: Elmer Picker M.D.   On: 10/01/2022 15:14   CT Cervical Spine Wo Contrast  Result Date: 10/01/2022 CLINICAL DATA:  Pain after a fall. EXAM: CT CERVICAL SPINE WITHOUT CONTRAST TECHNIQUE: Multidetector CT imaging of the cervical spine was performed without intravenous contrast. Multiplanar CT image reconstructions were also generated. RADIATION DOSE REDUCTION: This exam was performed according to the departmental dose-optimization program which includes automated exposure control, adjustment of the mA and/or kV according to patient size and/or use of iterative reconstruction technique. COMPARISON:  None Available. FINDINGS: Alignment: Trace retrolisthesis of C4 on 5 is compatible with the advanced loss of disc space at this level. No findings to suggest traumatic subluxation. Skull base and vertebrae: No acute fracture. No primary bone lesion or focal pathologic process. Soft tissues and spinal canal: No prevertebral fluid or swelling. No visible canal hematoma. Disc levels: Marked loss of disc height C4-5 and C6-7 with more mild loss of disc height noted at C3-4 and C5-6. Upper chest: Negative. Other: None. IMPRESSION: 1. No evidence for cervical spine fracture or  traumatic subluxation. 2.  Degenerative changes in the cervical spine as above. Electronically Signed   By: Misty Stanley M.D.   On: 10/01/2022 15:13   DG Lumbar Spine Complete  Result Date: 10/01/2022 CLINICAL DATA:  Fall 2 weeks ago.  Pain. EXAM: LUMBAR SPINE - COMPLETE 4+ VIEW COMPARISON:  None Available. FINDINGS: Mild curvature of the lumbar spine, apex to the left. 5 mm retrolisthesis of L5 versus S1. Trace retrolisthesis of L2 versus L3. No other malalignment. No fractures. Multilevel degenerative disc disease most prominent at L2-3. Lower lumbar facet degenerative changes are noted. Calcified atherosclerotic changes are identified in the abdominal aorta. IMPRESSION: 1. Mild curvature of the lumbar spine, apex to the left. 5 mm retrolisthesis of L5 versus S1. Trace retrolisthesis of L2 versus L3. 2. Degenerative changes as above. 3. Calcified atherosclerotic changes in the abdominal aorta. Electronically Signed   By: Dorise Bullion III M.D.   On: 10/01/2022 15:01     PROCEDURES:  Critical Care performed: No  Procedures   MEDICATIONS ORDERED IN ED: Medications  albuterol (VENTOLIN HFA) 108 (90 Base) MCG/ACT inhaler 1-2 puff (has no administration in time range)  anastrozole (ARIMIDEX) tablet 1 mg (has no administration in time range)  aspirin EC tablet 81 mg (has no administration in time range)  tiotropium (SPIRIVA) inhalation capsule ([facility] use ONLY) 18 mcg (has no administration in time range)  venlafaxine XR (EFFEXOR-XR) 24 hr capsule 225 mg (has no administration in time range)  amLODipine (NORVASC) tablet 10 mg (has no administration in time range)  LORazepam (ATIVAN) tablet 1-4 mg (2 mg Oral Given 10/01/22 2146)    Or  LORazepam (ATIVAN) injection 1-4 mg ( Intravenous See Alternative 10/01/22 2146)  thiamine (VITAMIN B1) tablet 100 mg (has no administration in time range)    Or  thiamine (VITAMIN B1) injection 100 mg (has no administration in time range)  folic acid (FOLVITE)  tablet 1 mg (has no administration in time range)  multivitamin with minerals tablet 1 tablet (has no administration in time range)  heparin injection 5,000 Units (5,000 Units Subcutaneous Given 10/01/22 2147)  sodium chloride flush (NS) 0.9 % injection 3 mL (has no administration in time range)  acetaminophen (TYLENOL) tablet 650 mg (has no administration in time range)    Or  acetaminophen (TYLENOL) suppository 650 mg (has no administration in time range)  nicotine (NICODERM CQ - dosed in mg/24 hours) patch 21 mg (has no administration in time range)  pantoprazole (PROTONIX) injection 40 mg (has no administration in time range)  0.9 %  sodium chloride infusion (has no administration in time range)  thiamine (VITAMIN B1) injection 500 mg (500 mg Intravenous Given 10/01/22 1855)  iohexol (OMNIPAQUE) 300 MG/ML solution 80 mL (80 mLs Intravenous Contrast Given 10/01/22 1903)     IMPRESSION / MDM / ASSESSMENT AND PLAN / ED COURSE  I reviewed the triage vital signs and the nursing notes.                              Differential diagnosis includes, but is not limited to, metabolic encephalopathy, Warnicke's encephalopathy, fracture, intracranial hemorrhage, UTI.  Patient's presentation is most consistent with encephalopathy/confusion.  She is being treated for Warnicke's encephalopathy with IV thiamine.  The patient is on the cardiac monitor to evaluate for evidence of arrhythmia and/or significant heart rate changes.  Patient's diagnosis is consistent with altered mental status. Patient will be admitted to the hospitalist service, accepted by Dr.  Patel.  Patient's sisters at bedside agree with plan.  Clinical Course as of 10/01/22 2241  Sun Oct 01, 2022  1922 Assumed care from Colin Rhein, MD. Pening CT CAP. Admit for wernickes encephalopathy [JP]    Clinical Course User Index [JP] Davyn Morandi, Clarnce Flock, PA-C    FINAL CLINICAL IMPRESSION(S) / ED DIAGNOSES   Final diagnoses:  Altered mental  status, unspecified altered mental status type  Wernicke's encephalopathy  Multiple falls     Rx / DC Orders   ED Discharge Orders     None        Note:  This document was prepared using Dragon voice recognition software and may include unintentional dictation errors.    Emeline Gins 10/01/22 2242    Blake Divine, MD 10/02/22 [phone]

## 2022-10-01 NOTE — Hospital Course (Signed)
Admission request:

## 2022-10-01 NOTE — ED Notes (Signed)
Report to [employer]

## 2022-10-01 NOTE — ED Notes (Signed)
Assumed care of pt. Pt awake and alert.

## 2022-10-01 NOTE — ED Provider Notes (Signed)
[facility] Provider Note    Event Date/Time   First MD Initiated Contact with Patient 10/01/22 1650     (approximate)   History   Chief Complaint Fall and Back Pain   HPI  Barbara Contreras is a 71 y.o. female with past medical history of hypertension, hyperlipidemia, CAD, asthma, and seizures who presents to the ED for multiple falls.  History is limited due to patient's current confusion and sisters at bedside states she seems more confused than usual today.  Patient reports that 2 days ago she fell while "going up the stairs."  She states that she hit her head but is not sure whether she lost consciousness, states that she additionally hit her right side and has significant pain there.  She then reports that she had another fall this morning, has a hard time describing the fall but states she ended up between her bed and the dresser.  She again states that she hit her head but is unsure whether she lost consciousness.  She primarily complains of pain along her right chest wall and right flank.  She states that she lives alone and has been having a hard time getting around due to the pain.  She does not think she takes any blood thinners, denies any numbness or weakness in her extremities.  She denies any fevers, cough, shortness of breath, nausea, vomiting, diarrhea, or dysuria.  She does admit to regular alcohol consumption with her last drink being yesterday, but is unable to quantify how much she drinks.  She additionally admits to marijuana use but denies other drug use.     Physical Exam   Triage Vital Signs: ED Triage Vitals  Enc Vitals Group     BP 10/01/22 1303 115/63     Pulse Rate 10/01/22 1303 67     Resp 10/01/22 1303 18     Temp 10/01/22 1303 97.8 F (36.6 C)     Temp Source 10/01/22 1303 Oral     SpO2 10/01/22 1303 97 %     Weight --      Height --      Head Circumference --      Peak Flow --      Pain Score 10/01/22 1301 7     Pain Loc  --      Pain Edu? --      Excl. in [place]? --     Most recent vital signs: Vitals:   10/01/22 1303  BP: 115/63  Pulse: 67  Resp: 18  Temp: 97.8 F (36.6 C)  SpO2: 97%    Constitutional: Alert and oriented to person, place, and time but not situation. Eyes: Conjunctivae are normal.  Pupils equal, round, and reactive bilaterally.  Extraocular movements intact with lateral nystagmus. Head: Atraumatic. Nose: No congestion/rhinnorhea. Mouth/Throat: Mucous membranes are moist.  Neck: No midline cervical spine tenderness to palpation. Cardiovascular: Normal rate, regular rhythm. Grossly normal heart sounds.  2+ radial pulses bilaterally. Respiratory: Normal respiratory effort.  No retractions. Lungs CTAB.  Right chest wall tenderness to palpation. Gastrointestinal: Soft and nontender. No distention. Musculoskeletal: No lower extremity tenderness nor edema.  Right paraspinal lumbar tenderness to palpation, no midline thoracic or lumbar spinal tenderness.  No upper or lower extremity bony tenderness to palpation. Neurologic:  Normal speech and language. No gross focal neurologic deficits are appreciated.    ED Results / Procedures / Treatments   Labs (all labs ordered are listed, but only abnormal results are  displayed) Labs Reviewed  COMPREHENSIVE METABOLIC PANEL - Abnormal; Notable for the following components:      Result Value   BUN 42 (*)    Creatinine, Ser 1.33 (*)    AST 54 (*)    Total Bilirubin 1.4 (*)    GFR, Estimated 43 (*)    All other components within normal limits  URINALYSIS, ROUTINE W REFLEX MICROSCOPIC - Abnormal; Notable for the following components:   Color, Urine YELLOW (*)    APPearance CLEAR (*)    Ketones, ur 5 (*)    All other components within normal limits  URINE DRUG SCREEN, QUALITATIVE ([facility] ONLY) - Abnormal; Notable for the following components:   Cannabinoid 50 Ng, Ur [place] POSITIVE (*)    All other components within normal limits  TROPONIN I (HIGH  SENSITIVITY) - Abnormal; Notable for the following components:   Troponin I (High Sensitivity) 54 (*)    All other components within normal limits  CBC WITH DIFFERENTIAL/PLATELET  ETHANOL  MAGNESIUM  TROPONIN I (HIGH SENSITIVITY)     EKG  ED ECG REPORT I, Blake Divine, the attending physician, personally viewed and interpreted this ECG.   Date: 10/01/2022  EKG Time: 17:44  Rate: 76  Rhythm: normal sinus rhythm  Axis: Normal  Intervals:none  ST&T Change: LVH  RADIOLOGY CT head reviewed and interpreted by me with no hemorrhage or midline shift.  CT cervical spine reviewed and interpreted by me with no fracture or dislocation.  PROCEDURES:  Critical Care performed: No  Procedures   MEDICATIONS ORDERED IN ED: Medications  thiamine (VITAMIN B1) injection 500 mg (500 mg Intravenous Given 10/01/22 1855)  iohexol (OMNIPAQUE) 300 MG/ML solution 80 mL (80 mLs Intravenous Contrast Given 10/01/22 1903)     IMPRESSION / MDM / ASSESSMENT AND PLAN / ED COURSE  I reviewed the triage vital signs and the nursing notes.                              71 y.o. female with past medical history of hypertension, hyperlipidemia, CAD, asthma, and seizures who presents to the ED for multiple falls over the past 3 days and increased confusion per sisters at bedside.  Patient's presentation is most consistent with acute presentation with potential threat to life or bodily function.  Differential diagnosis includes, but is not limited to, intracranial traumatic injury, cervical spine injury, rib fracture, hemothorax, pneumothorax, chest wall contusion, alcohol intoxication, Wernicke's encephalopathy, electrolyte abnormality, AKI, UTI, substance abuse, dementia.  Patient nontoxic-appearing and in no acute distress, vital signs are unremarkable and she has no focal neurologic deficits on exam.  Imaging of her head and cervical spine obtained from triage are both negative for acute process, lumbar  spinal x-ray is also unremarkable.  She does have significant bruising to her right lower chest wall and flank area, will further assess with CT of chest/abdomen/pelvis.  Patient declines pain medication, does seem quite confused and potentially intoxicated.  Labs are pending including ethanol level and urinalysis, would also strongly consider Wernicke's encephalopathy.  Ethanol level is undetectable, urinalysis shows no signs of infection.  We will cover for Warnicke's encephalopathy with IV thiamine.  Remainder of labs remarkable for AKI without significant electrolyte abnormality, magnesium level within normal limits.  Patient does have mildly elevated troponin however I suspect this is due to her AKI, plan to trend second set troponin.  No significant anemia or leukocytosis noted and no findings to suggest  infectious process.  Patient turned over to oncoming rider pending additional CT results and admission to the hospitalist.   FINAL CLINICAL IMPRESSION(S) / ED DIAGNOSES   Final diagnoses:  Altered mental status, unspecified altered mental status type  Wernicke's encephalopathy  Multiple falls     Rx / DC Orders   ED Discharge Orders     None        Note:  This document was prepared using Dragon voice recognition software and may include unintentional dictation errors.   Blake Divine, MD 10/01/22 1932

## 2022-10-01 NOTE — ED Triage Notes (Signed)
Pt states she fell 2 weeks ago and has had progressing back pain. Also reports she just remembered she fell again this morning between bed and dresser and woke herself up with that fall.  Lower back pain has increased and is unable to ambulate without assistance.

## 2022-10-01 NOTE — ED Triage Notes (Signed)
C/o lower right back pain. Reports fall X2 days ago and pain has gotten worse. EMS reports able to ambulate with assistance.  Hx Right breast cancer  EMS vitals: 139/72 b/p 14RR

## 2022-10-02 ENCOUNTER — Observation Stay: Payer: Medicare Other

## 2022-10-02 ENCOUNTER — Observation Stay
Admit: 2022-10-02 | Discharge: 2022-10-02 | Disposition: A | Discharge status: Home / Self Care | Payer: Medicare Other | Attending: Internal Medicine | Admitting: Internal Medicine

## 2022-10-02 ENCOUNTER — Encounter (INDEPENDENT_AMBULATORY_CARE_PROVIDER_SITE_OTHER): Payer: Self-pay

## 2022-10-02 ENCOUNTER — Encounter: Payer: Self-pay | Admitting: Internal Medicine

## 2022-10-02 DIAGNOSIS — F1090 Alcohol use, unspecified, uncomplicated: Secondary | ICD-10-CM | POA: Diagnosis present

## 2022-10-02 DIAGNOSIS — W19XXXA Unspecified fall, initial encounter: Secondary | ICD-10-CM | POA: Diagnosis present

## 2022-10-02 DIAGNOSIS — S32028A Other fracture of second lumbar vertebra, initial encounter for closed fracture: Secondary | ICD-10-CM

## 2022-10-02 DIAGNOSIS — I6523 Occlusion and stenosis of bilateral carotid arteries: Secondary | ICD-10-CM | POA: Diagnosis not present

## 2022-10-02 DIAGNOSIS — S32018A Other fracture of first lumbar vertebra, initial encounter for closed fracture: Secondary | ICD-10-CM

## 2022-10-02 DIAGNOSIS — R55 Syncope and collapse: Principal | ICD-10-CM

## 2022-10-02 DIAGNOSIS — S32029A Unspecified fracture of second lumbar vertebra, initial encounter for closed fracture: Secondary | ICD-10-CM | POA: Diagnosis present

## 2022-10-02 DIAGNOSIS — S2241XA Multiple fractures of ribs, right side, initial encounter for closed fracture: Secondary | ICD-10-CM

## 2022-10-02 DIAGNOSIS — S2231XA Fracture of one rib, right side, initial encounter for closed fracture: Secondary | ICD-10-CM | POA: Diagnosis present

## 2022-10-02 DIAGNOSIS — S32019A Unspecified fracture of first lumbar vertebra, initial encounter for closed fracture: Secondary | ICD-10-CM | POA: Diagnosis present

## 2022-10-02 DIAGNOSIS — N179 Acute kidney failure, unspecified: Secondary | ICD-10-CM

## 2022-10-02 DIAGNOSIS — F1010 Alcohol abuse, uncomplicated: Secondary | ICD-10-CM | POA: Diagnosis present

## 2022-10-02 LAB — COMPREHENSIVE METABOLIC PANEL
ALT: 25 U/L (ref 0–44)
AST: 39 U/L (ref 15–41)
Albumin: 3.5 g/dL (ref 3.5–5.0)
Alkaline Phosphatase: 59 U/L (ref 38–126)
Anion gap: 10 (ref 5–15)
BUN: 35 mg/dL — ABNORMAL HIGH (ref 8–23)
CO2: 26 mmol/L (ref 22–32)
Calcium: 8.7 mg/dL — ABNORMAL LOW (ref 8.9–10.3)
Chloride: 104 mmol/L (ref 98–111)
Creatinine, Ser: 1.03 mg/dL — ABNORMAL HIGH (ref 0.44–1.00)
GFR, Estimated: 58 mL/min — ABNORMAL LOW (ref >60)
Glucose, Bld: 96 mg/dL (ref 70–99)
Potassium: 3.8 mmol/L (ref 3.5–5.1)
Sodium: 140 mmol/L (ref 135–145)
Total Bilirubin: 1.4 mg/dL — ABNORMAL HIGH (ref 0.3–1.2)
Total Protein: 6.5 g/dL (ref 6.5–8.1)

## 2022-10-02 LAB — CBC
HCT: 43.7 % (ref 36.0–46.0)
Hemoglobin: 14.4 g/dL (ref 12.0–15.0)
MCH: 30.4 pg (ref 26.0–34.0)
MCHC: 33 g/dL (ref 30.0–36.0)
MCV: 92.2 fL (ref 80.0–100.0)
Platelets: 166 10*3/uL (ref 150–400)
RBC: 4.74 MIL/uL (ref 3.87–5.11)
RDW: 13.8 % (ref 11.5–15.5)
WBC: 6.6 10*3/uL (ref 4.0–10.5)
nRBC: 0 % (ref 0.0–0.2)

## 2022-10-02 LAB — PHOSPHORUS: Phosphorus: 3.4 mg/dL (ref 2.5–4.6)

## 2022-10-02 LAB — MAGNESIUM: Magnesium: 2.2 mg/dL (ref 1.7–2.4)

## 2022-10-02 LAB — CK: Total CK: 299 U/L — ABNORMAL HIGH (ref 38–234)

## 2022-10-02 MED ORDER — HYDROMORPHONE HCL 1 MG/ML IJ SOLN
0.5000 mg | INTRAMUSCULAR | Status: DC | PRN
  Administered 2022-10-03: 0.5 mg via INTRAVENOUS
  Filled 2022-10-02: qty 1

## 2022-10-02 MED ORDER — INFLUENZA VAC A&B SA ADJ QUAD 0.5 ML IM PRSY
0.5000 mL | PREFILLED_SYRINGE | INTRAMUSCULAR | Status: AC
  Administered 2022-10-03: 0.5 mL via INTRAMUSCULAR
  Filled 2022-10-02: qty 0.5

## 2022-10-02 MED ORDER — THIAMINE HCL 100 MG/ML IJ SOLN: 100.0000 mg | Freq: Every day | INTRAMUSCULAR | Status: DC

## 2022-10-02 MED ORDER — OXYCODONE HCL 5 MG PO TABS
5.0000 mg | ORAL_TABLET | Freq: Three times a day (TID) | ORAL | Status: DC | PRN
  Administered 2022-10-02 – 2022-10-03 (×2): 5 mg via ORAL
  Filled 2022-10-02 (×2): qty 1

## 2022-10-02 MED ORDER — THIAMINE MONONITRATE 100 MG PO TABS
100.0000 mg | ORAL_TABLET | Freq: Every day | ORAL | Status: DC
  Administered 2022-10-03: 100 mg via ORAL
  Filled 2022-10-02: qty 1

## 2022-10-02 MED ORDER — IOHEXOL 350 MG/ML SOLN
80.0000 mL | Freq: Once | INTRAVENOUS | Status: AC | PRN
  Administered 2022-10-02: 75 mL via INTRAVENOUS

## 2022-10-02 MED ORDER — OXYCODONE HCL 5 MG PO TABS
5.0000 mg | ORAL_TABLET | Freq: Once | ORAL | Status: AC
  Administered 2022-10-02: 5 mg via ORAL
  Filled 2022-10-02: qty 1

## 2022-10-02 NOTE — Progress Notes (Signed)
Prior-To-Admission Oral Chemotherapy for Treatment of Oncologic Disease   Order noted from Dr. Posey Pronto to continue prior-to-admission oral chemotherapy regimen of anastrozole.  Procedure Per Pharmacy & Therapeutics Committee Policy: Orders for continuation of home oral chemotherapy for treatment of an oncologic disease will be held unless approved by an oncologist during current admission.    For patients receiving oncology care at [facility], inpatient pharmacist contacts patient's oncologist during regular office hours to review. If earlier review is medically necessary, attending physician consults [facility] on-call oncologist   For patients receiving oncology care outside of [facility], attending physician consults patient's oncologist to review. If this oncologist or their coverage cannot be reached, attending physician consults [facility] on-call oncologist   Oral chemotherapy continuation order is on hold pending oncologist review, [facility] oncologist Dr. Rogue Bussing will be notified by inpatient pharmacy during office hours   Lu Duffel, PharmD, MBA

## 2022-10-02 NOTE — Assessment & Plan Note (Signed)
Lab Results  Component Value Date   CREATININE 1.33 (H) 10/01/2022   CREATININE 0.88 08/03/2021   CREATININE 0.74 06/17/2017  Will follow, avoid contrast, renally dose all needed medications. We will hold patient's lisinopril and HCTZ.

## 2022-10-02 NOTE — Assessment & Plan Note (Signed)
CIWA precautions Aspiration/fall/seizure precautions Thiamine 100 mg daily. IV PPI. Counseling once patient is stable.

## 2022-10-02 NOTE — ED Notes (Signed)
Pt 02 is in the high 80s-low90s when asleep, pt in 2L [place]

## 2022-10-02 NOTE — Progress Notes (Signed)
*  PRELIMINARY RESULTS* Echocardiogram 2D Echocardiogram has been performed.  Barbara Contreras 10/02/2022, 12:31 PM

## 2022-10-02 NOTE — Assessment & Plan Note (Signed)
Troponin is mildly elevated I do not suspect it is a cardiac source. From the initial troponins of 54 is gone down to 41. Suspect may be related to the AKI that the patient has.

## 2022-10-02 NOTE — Progress Notes (Signed)
       CROSS COVER NOTE  NAME: ATHALIAH BAUMBACH MRN: [id number] DOB : Aug 10, 1951 ATTENDING PHYSICIAN: Para Skeans, MD    Date of Service   10/02/2022   HPI/Events of Note   Medication request received for patient report of 10/10 rib and back pain refractory to tylenol.  Interventions   Assessment/Plan:  Oxycodone x1     This document was prepared using Dragon voice recognition software and may include unintentional dictation errors.  Neomia Glass DNP, MBA, FNP-BC Nurse Practitioner [facility] Pager [phone]

## 2022-10-02 NOTE — Plan of Care (Signed)
°  Problem: Coping: °Goal: Level of anxiety will decrease °Outcome: Progressing °  °

## 2022-10-02 NOTE — Consult Note (Signed)
[facility] Vascular Consult Note  MRN : [id number]  DEAIRA Contreras is a 71 y.o. (1951/11/09) female who presents with chief complaint of  Chief Complaint  Patient presents with   Fall   Back Pain  .   Consulting Physician: Jennye Boroughs MD Reason for consult: High grade left carotid stenosis History of Present Illness: Patient is a 71 Y.O female that presented to the emergency room with syncope and collapse. In a discussion with the patient she states she has a Hx of back problems and normally takes Baclofen at night time. On the day of syncope and collapse she states she took Baclofen 2 tablets = 10 mg with a pain pill for severe back pain prior to going to her niece's wedding. Later that evening after going home from the wedding she fell between her bed and dresser. She denies blacking out and remembers falling. Time line is not entirely clear as the patient is a poor historian. She denied hitting her head. She denies any headaches or blurred vision. She denies any vertigo or dizziness. She states she has had an issue with falling due to her back. She reports knowing about her carotid disease in both carotid arteries. Left greater then right side. She admits to having prior carotid surgery on her right side. Patient tells [place] she saw Dr. Nehemiah Massed last month and she was referred to see a specialist about her carotid arteries. In Epic she was referred to see [place] here in vascular surgery later this month. Patient stated if she needs surgery she is amenable to it.   Current Facility-Administered Medications  Medication Dose Route Frequency Provider Last Rate Last Admin   0.9 %  sodium chloride infusion   Intravenous Continuous Para Skeans, MD 75 mL/hr at 10/02/22 0038 New Bag at 10/02/22 0038   acetaminophen (TYLENOL) tablet 650 mg  650 mg Oral Q6H PRN Para Skeans, MD   650 mg at 10/02/22 2725   Or   acetaminophen (TYLENOL) suppository 650 mg  650 mg Rectal Q6H PRN  Para Skeans, MD       albuterol (PROVENTIL) (2.5 MG/3ML) 0.083% nebulizer solution 3 mL  3 mL Inhalation Q6H PRN Para Skeans, MD       amLODipine (NORVASC) tablet 5 mg  5 mg Oral BID Wynelle Cleveland, RPH   5 mg at 10/02/22 1013   anastrozole (ARIMIDEX) tablet 1 mg  1 mg Oral Daily Florina Ou V, MD   1 mg at 10/02/22 1322   aspirin EC tablet 81 mg  81 mg Oral Daily Florina Ou V, MD   81 mg at 36/64/40 3474   folic acid (FOLVITE) tablet 1 mg  1 mg Oral Daily Florina Ou V, MD   1 mg at 10/02/22 1013   heparin injection 5,000 Units  5,000 Units Subcutaneous Q12H Florina Ou V, MD   5,000 Units at 10/02/22 1013   HYDROmorphone (DILAUDID) injection 0.5 mg  0.5 mg Intravenous Q3H PRN Jennye Boroughs, MD       LORazepam (ATIVAN) tablet 1-4 mg  1-4 mg Oral Q1H PRN Para Skeans, MD   2 mg at 10/01/22 2146   Or   LORazepam (ATIVAN) injection 1-4 mg  1-4 mg Intravenous Q1H PRN Para Skeans, MD       multivitamin with minerals tablet 1 tablet  1 tablet Oral Daily Para Skeans, MD   1 tablet at 10/02/22 1013   nicotine (NICODERM  CQ - dosed in mg/24 hours) patch 21 mg  21 mg Transdermal Daily Para Skeans, MD   21 mg at 10/02/22 1014   oxyCODONE (Oxy IR/ROXICODONE) immediate release tablet 5 mg  5 mg Oral Q8H PRN Jennye Boroughs, MD       pantoprazole (PROTONIX) injection 40 mg  40 mg Intravenous Q12H Para Skeans, MD   40 mg at 10/02/22 1013   thiamine (VITAMIN B1) tablet 100 mg  100 mg Oral Daily Wynelle Cleveland, RPH       Or   thiamine (VITAMIN B1) injection 100 mg  100 mg Intravenous Daily Wynelle Cleveland, RPH       tiotropium (SPIRIVA) inhalation capsule ([facility] use ONLY) 18 mcg  18 mcg Inhalation Valrie Hart, MD   18 mcg at 10/02/22 1323   venlafaxine XR (EFFEXOR-XR) 24 hr capsule 75 mg  75 mg Oral TID Wynelle Cleveland, RPH   75 mg at 10/02/22 1323   Current Outpatient Medications  Medication Sig Dispense Refill   albuterol (VENTOLIN HFA) 108 (90 Base) MCG/ACT inhaler Inhale  1-2 puffs into the lungs every 6 (six) hours as needed for wheezing or shortness of breath.     amLODipine (NORVASC) 5 MG tablet Take 5 mg by mouth daily.     anastrozole (ARIMIDEX) 1 MG tablet TAKE 1 TABLET BY MOUTH EVERY DAY 90 tablet 0   aspirin EC 81 MG tablet Take 81 mg by mouth daily.     atorvastatin (LIPITOR) 80 MG tablet Take 80 mg by mouth daily.     baclofen (LIORESAL) 10 MG tablet Take 5-10 mg by mouth at bedtime as needed for muscle spasms.     calcium carbonate (TUMS EX) 750 MG chewable tablet Chew 1 tablet by mouth daily as needed for heartburn.     Cholecalciferol (VITAMIN D3) 10 MCG (400 UNIT) tablet Take 400 Units by mouth daily.     clobetasol (TEMOVATE) 0.05 % external solution Apply 1 Application topically daily.     dicyclomine (BENTYL) 10 MG capsule Take 10 mg by mouth in the morning and at bedtime.     docusate sodium (COLACE) 100 MG capsule Take 100 mg by mouth 2 (two) times daily.     famotidine (PEPCID) 20 MG tablet Take 20 mg by mouth 2 (two) times daily.     gabapentin (NEURONTIN) 600 MG tablet Take 600 mg by mouth 3 (three) times daily.  5   ketoconazole (NIZORAL) 2 % shampoo Apply 1 application topically once a week.     lisinopril-hydrochlorothiazide (PRINZIDE,ZESTORETIC) 20-12.5 MG tablet Take 1 tablet by mouth in the morning and at bedtime.     Multiple Vitamin (MULTIVITAMIN WITH MINERALS) TABS tablet Take 1 tablet by mouth daily.     venlafaxine XR (EFFEXOR-XR) 75 MG 24 hr capsule Take 75 mg by mouth 3 (three) times daily.     vitamin B-12 (CYANOCOBALAMIN) 1000 MCG tablet Take 1,000 mcg by mouth daily.     Zinc Sulfate (ZINC 15 PO) Take 15 mg by mouth daily.     atorvastatin (LIPITOR) 40 MG tablet Take 40 mg by mouth every morning. (Patient not taking: Reported on 10/01/2022)  11   doxycycline (MONODOX) 100 MG capsule Take 100 mg by mouth 2 (two) times daily. (Patient not taking: Reported on 10/24/2021)     fluticasone (FLONASE) 50 MCG/ACT nasal spray Place 2  sprays into both nostrils daily as needed for allergies. (Patient not taking: Reported on 10/01/2022)  2   ibuprofen (ADVIL) 800 MG tablet Take 1 tablet (800 mg total) by mouth every 8 (eight) hours as needed for mild pain or moderate pain. (Patient not taking: Reported on 10/01/2022) 30 tablet 0   montelukast (SINGULAIR) 10 MG tablet Take 10 mg by mouth as needed. (Patient not taking: Reported on 10/01/2022)     tiotropium (SPIRIVA) 18 MCG inhalation capsule Place 18 mcg into inhaler and inhale every morning. (Patient not taking: Reported on 10/01/2022)      Past Medical History:  Diagnosis Date   Arthritis    Asthma    Back pain    Breast cancer ([place]) 2022   Carotid artery stenosis    Carotid stenosis    Coronary artery disease    Depression    Emphysema of lung (HCC)    GERD (gastroesophageal reflux disease)    Heart murmur    Hip pain    History of Lyme disease    HLD (hyperlipidemia)    Hyperlipemia    Hypertension    IBS (irritable bowel syndrome)    IBS (irritable bowel syndrome)    Lyme disease    Pre-diabetes    Sciatica    Sciatica    Seizures (HCC)    Tremor    Vitamin D deficiency    Vitamin D deficiency     Past Surgical History:  Procedure Laterality Date   ABDOMINAL HYSTERECTOMY     BREAST BIOPSY Right 07/22/2021   [place] bx 1:00 area, venus marker, IMC tubular features   BREAST BIOPSY Right 07/22/2021   [place] bx 2:00 ribbon marker, fibroadenoma   BREAST LUMPECTOMY Right 08/24/2021   re-excision   BREAST LUMPECTOMY Right 08/12/2021   CAROTID ENDARTERECTOMY Right    CHOLECYSTECTOMY     COLONOSCOPY WITH PROPOFOL N/A 09/13/2018   Procedure: COLONOSCOPY WITH PROPOFOL;  Surgeon: Lollie Sails, MD;  Location: [facility] ENDOSCOPY;  Service: Endoscopy;  Laterality: N/A;   ESOPHAGOGASTRODUODENOSCOPY N/A 05/03/2021   Procedure: ESOPHAGOGASTRODUODENOSCOPY (EGD);  Surgeon: Lesly Rubenstein, MD;  Location: [facility] ENDOSCOPY;  Service: Endoscopy;  Laterality: N/A;    paranasal sinusotomy     PART MASTECTOMY,RADIO FREQUENCY LOCALIZER,AXILLARY SENTINEL NODE BIOPSY Right 08/12/2021   Procedure: PART MASTECTOMY,RADIO FREQUENCY LOCALIZER,AXILLARY SENTINEL NODE BIOPSY;  Surgeon: Benjamine Sprague, DO;  Location: [facility] ORS;  Service: General;  Laterality: Right;   RE-EXCISION OF BREAST CANCER,SUPERIOR MARGINS Right 08/24/2021   Procedure: RE-EXCISION OF BREAST CANCER,SUPERIOR MARGINS;  Surgeon: Benjamine Sprague, DO;  Location: [facility] ORS;  Service: General;  Laterality: Right;    Social History Social History   Tobacco Use   Smoking status: Every Day    Packs/day: 0.50    Years: 55.00    Total pack years: 27.50    Types: Cigarettes   Smokeless tobacco: Never  Vaping Use   Vaping Use: Never used  Substance Use Topics   Alcohol use: Yes    Alcohol/week: 0.0 standard drinks of alcohol    Comment: occasional   Drug use: Yes    Types: Marijuana    Family History Family History  Problem Relation Age of Onset   Alcohol abuse Father    Alzheimer's disease Father    Anxiety disorder Father    Depression Father    Alcohol abuse Mother    CAD Mother    Diabetes Mother    Hyperlipidemia Mother    Depression Mother     Allergies  Allergen Reactions   Morphine And Related Itching   Clindamycin Other (See Comments)  Erythromycin     Other reaction(s): Other (see comments)   Other     Other reaction(s): Other (See Comments) Redness and itching   Penicillins Other (See Comments)    TOLERATED CEFAZOLIN PRIOR Reaction: unknown Has patient had a PCN reaction causing immediate rash, facial/tongue/throat swelling, SOB or lightheadedness with hypotension: Unknown Has patient had a PCN reaction causing severe rash involving mucus membranes or skin necrosis: Unknown Has patient had a PCN reaction that required hospitalization: Unknown Has patient had a PCN reaction occurring within the last 10 years: no If all of the above answers are "NO", then may proceed with  Cephalosporin use.   Promethazine-Phenylephrine Other (See Comments)    Muscle spasm   Tape Other (See Comments)    Redness and itching     REVIEW OF SYSTEMS (Negative unless checked)  Constitutional: '[]'$ Weight loss  '[]'$ Fever  '[]'$ Chills Cardiac: '[]'$ Chest pain   '[]'$ Chest pressure   '[]'$ Palpitations   '[]'$ Shortness of breath when laying flat   '[]'$ Shortness of breath at rest   '[]'$ Shortness of breath with exertion. Vascular:  '[]'$ Pain in legs with walking   '[]'$ Pain in legs at rest   '[]'$ Pain in legs when laying flat   '[]'$ Claudication   '[]'$ Pain in feet when walking  '[]'$ Pain in feet at rest  '[]'$ Pain in feet when laying flat   '[]'$ History of DVT   '[]'$ Phlebitis   '[]'$ Swelling in legs   '[]'$ Varicose veins   '[]'$ Non-healing ulcers Pulmonary:   '[]'$ Uses home oxygen   '[]'$ Productive cough   '[]'$ Hemoptysis   '[]'$ Wheeze  '[]'$ COPD   '[]'$ Asthma Neurologic:  '[]'$ Dizziness  '[]'$ Blackouts   '[]'$ Seizures   '[]'$ History of stroke   '[]'$ History of TIA  '[]'$ Aphasia   '[]'$ Temporary blindness   '[]'$ Dysphagia   '[]'$ Weakness or numbness in arms   '[x]'$ Weakness or numbness in legs Musculoskeletal:  '[]'$ Arthritis   '[]'$ Joint swelling   '[]'$ Joint pain   '[x]'$ Low back pain Hematologic:  '[]'$ Easy bruising  '[]'$ Easy bleeding   '[]'$ Hypercoagulable state   '[]'$ Anemic  '[]'$ Hepatitis Gastrointestinal:  '[]'$ Blood in stool   '[]'$ Vomiting blood  '[]'$ Gastroesophageal reflux/heartburn   '[]'$ Difficulty swallowing. Genitourinary:  '[]'$ Chronic kidney disease   '[]'$ Difficult urination  '[]'$ Frequent urination  '[]'$ Burning with urination   '[]'$ Blood in urine Skin:  '[]'$ Rashes   '[]'$ Ulcers   '[]'$ Wounds Psychological:  '[]'$ History of anxiety   '[]'$  History of major depression.  Physical Examination  Vitals:   10/02/22 0513 10/02/22 0700 10/02/22 0800 10/02/22 1200  BP: 136/65 (!) 168/71 104/81 (!) 142/92  Pulse: 76 83 85 70  Resp: '17 16 17 14  '$ Temp: 98.5 F (36.9 C)  98.6 F (37 C) 98.2 F (36.8 C)  TempSrc: Oral  Oral Oral  SpO2: 97% 98% 97% 98%  Weight:      Height:       Body mass index is 27.15 kg/m. Gen:  WD/WN, NAD Head:  [place]/AT, No temporalis wasting. Prominent temp pulse not noted. Ear/Nose/Throat: Hearing grossly intact, nares w/o erythema or drainage, oropharynx w/o Erythema/Exudate Eyes: Sclera non-icteric, conjunctiva clear Neck: Trachea midline.  No JVD.  Pulmonary:  Good air movement, respirations not labored, equal bilaterally.  Cardiac: RRR, normal S1, S2. Gastrointestinal: soft, non-tender/non-distended. No guarding/reflex.  Musculoskeletal: M/S 5/5 throughout.  Extremities without ischemic changes.  No deformity or atrophy. No edema. Neurologic: Sensation grossly intact in extremities.  Symmetrical.  Speech is fluent. Motor exam as listed above. Psychiatric: Judgment intact, Mood & affect appropriate for pt's clinical situation. Dermatologic: No rashes or ulcers noted.  No cellulitis or open wounds.  Lymph : No Cervical, Axillary, or Inguinal lymphadenopathy.    CBC Lab Results  Component Value Date   WBC 6.6 10/02/2022   HGB 14.4 10/02/2022   HCT 43.7 10/02/2022   MCV 92.2 10/02/2022   PLT 166 10/02/2022    BMET    Component Value Date/Time   NA 140 10/02/2022 0630   K 3.8 10/02/2022 0630   CL 104 10/02/2022 0630   CO2 26 10/02/2022 0630   GLUCOSE 96 10/02/2022 0630   BUN 35 (H) 10/02/2022 0630   CREATININE 1.03 (H) 10/02/2022 0630   CALCIUM 8.7 (L) 10/02/2022 0630   GFRNONAA 58 (L) 10/02/2022 0630   GFRAA >60 06/17/2017 0302   Estimated Creatinine Clearance: 49.7 mL/min (A) (by C-G formula based on SCr of 1.03 mg/dL (H)).  COAG Lab Results  Component Value Date   INR 1.0 08/03/2021    Radiology US Carotid Bilateral  Result Date: 10/02/2022 CLINICAL DATA:  Syncope and collapse. History prior right carotid endarterectomy and carotid atherosclerosis. Hypertension, hyperlipidemia and smoking history. EXAM: BILATERAL CAROTID DUPLEX ULTRASOUND TECHNIQUE: Pearline Cables scale imaging, color Doppler and duplex ultrasound were performed of bilateral carotid and vertebral arteries in the  neck. COMPARISON:  Prior carotid duplex ultrasound at [facility] on 07/25/2022 FINDINGS: Criteria: Quantification of carotid stenosis is based on velocity parameters that correlate the residual internal carotid diameter with NASCET-based stenosis levels, using the diameter of the distal internal carotid lumen as the denominator for stenosis measurement. The following velocity measurements were obtained: RIGHT ICA:  181/28 cm/sec CCA:  73/71 cm/sec SYSTOLIC ICA/CCA RATIO:  2.0 ECA:  123 cm/sec LEFT ICA:  422/117 cm/sec CCA:  06/26 cm/sec SYSTOLIC ICA/CCA RATIO:  5.1 ECA:  164 cm/sec RIGHT CAROTID ARTERY: Moderate, predominately calcified plaque again noted at the level of the distal bulb and proximal right ICA. Estimated right ICA stenosis is 50-69%. RIGHT VERTEBRAL ARTERY: Antegrade flow with normal waveform and velocity. LEFT CAROTID ARTERY: Large amount of predominately calcified plaque is again noted at the level of the distal bulb and proximal left ICA. Markedly elevated velocities are consistent with a greater than 70% stenosis and likely critical stenosis. LEFT VERTEBRAL ARTERY: Antegrade flow with normal waveform and velocity. IMPRESSION: 1. Large amount of plaque at the level of the left carotid bulb and proximal left ICA. Markedly elevated velocities are consistent with a greater than 70% stenosis and likely critical stenosis. 2. Moderate plaque at the level of the right carotid bulb and proximal right ICA. Estimated right ICA stenosis is 50-69%. Electronically Signed   By: Aletta Edouard M.D.   On: 10/02/2022 10:10   MR BRAIN WO CONTRAST  Result Date: 10/01/2022 CLINICAL DATA:  Delirium, syncope/presyncope EXAM: MRI HEAD WITHOUT CONTRAST TECHNIQUE: Multiplanar, multiecho pulse sequences of the brain and surrounding structures were obtained without intravenous contrast. COMPARISON:  12/29/2019 MRI head, correlation is also made with 10/01/2022 CT head FINDINGS: Brain: No restricted diffusion to  suggest acute or subacute infarct. No acute hemorrhage, mass, mass effect, or midline shift. No hydrocephalus or extra-axial collection. No hemosiderin deposition to suggest remote hemorrhage. Vascular: Normal arterial flow voids. Skull and upper cervical spine: Normal marrow signal. Sinuses/Orbits: Clear paranasal sinuses. The orbits are unremarkable. Other: Trace fluid in the right mastoid tip. IMPRESSION: No acute intracranial process. No evidence of acute or subacute infarct. Electronically Signed   By: Merilyn Baba M.D.   On: 10/01/2022 23:11   CT CHEST ABDOMEN PELVIS W CONTRAST  Result Date: 10/01/2022 CLINICAL DATA:  Trauma. EXAM:  CT CHEST, ABDOMEN, AND PELVIS WITH CONTRAST TECHNIQUE: Multidetector CT imaging of the chest, abdomen and pelvis was performed following the standard protocol during bolus administration of intravenous contrast. RADIATION DOSE REDUCTION: This exam was performed according to the departmental dose-optimization program which includes automated exposure control, adjustment of the mA and/or kV according to patient size and/or use of iterative reconstruction technique. CONTRAST:  68m OMNIPAQUE IOHEXOL 300 MG/ML  SOLN COMPARISON:  Chest CT dated 06/27/2022. FINDINGS: CT CHEST FINDINGS Cardiovascular: There is no cardiomegaly or pericardial effusion there is coronary vascular calcification and calcification of the mitral annulus. Moderate atherosclerotic calcification of the thoracic aorta. No aneurysmal dilatation or dissection. The origins of the great vessels of the aortic arch and the central pulmonary arteries appear patent. Mediastinum/Nodes: No hilar or mediastinal adenopathy. The esophagus is grossly unremarkable. No mediastinal fluid collection. Lungs/Pleura: Bibasilar linear atelectasis/scarring. There is a 3 mm left upper lobe calcified granuloma. No focal consolidation or pneumothorax. Trace right pleural effusion may be present. Musculoskeletal: Nondisplaced fracture of  the posterior right eleventh rib at the costovertebral articulation as well as fracture of the posterior right eleventh rib. Displaced fracture of the posterior right tenth rib. CT ABDOMEN PELVIS FINDINGS No intra-abdominal free air or free fluid. Hepatobiliary: Mild fatty liver. No biliary dilatation. Cholecystectomy. No retained calcified stone noted in the central CBD. Pancreas: Unremarkable. No pancreatic ductal dilatation or surrounding inflammatory changes. Spleen: Normal in size without focal abnormality. Adrenals/Urinary Tract: The right adrenal gland is unremarkable. Indeterminate 2 cm left adrenal nodule as seen on the prior CT as adenoma. No follow-up. There is no hydronephrosis on either side. There is symmetric enhancement and excretion of contrast by both kidneys. The visualized ureters and urinary bladder appear unremarkable. Stomach/Bowel: There is severe sigmoid diverticulosis and scattered colonic diverticula without active inflammatory changes. There is no bowel obstruction or active inflammation. The appendix is normal. Vascular/Lymphatic: Advanced aortoiliac atherosclerotic disease. The IVC is unremarkable. No portal venous gas. There is no adenopathy. Reproductive: Hysterectomy.  No adnexal masses. Other: None Musculoskeletal: Minimally displaced fractures of the right L1 and L2 transverse processes. There is degenerative changes of the spine and hips. No acute osseous pathology. Grade 1 L4-L5 anterolisthesis. IMPRESSION: 1. Fractures of the posterior right eleventh and tenth ribs. No pneumothorax. 2. Minimally displaced fractures of the right L1 and L2 transverse processes. 3. Colonic diverticulosis. No bowel obstruction. Normal appendix. 4.  Aortic Atherosclerosis (ICD10-I70.0). Electronically Signed   By: AAnner CreteM.D.   On: 10/01/2022 19:29   CT Head Wo Contrast  Result Date: 10/01/2022 CLINICAL DATA:  Trauma, fall EXAM: CT HEAD WITHOUT CONTRAST TECHNIQUE: Contiguous axial  images were obtained from the base of the skull through the vertex without intravenous contrast. RADIATION DOSE REDUCTION: This exam was performed according to the departmental dose-optimization program which includes automated exposure control, adjustment of the mA and/or kV according to patient size and/or use of iterative reconstruction technique. COMPARISON:  06/16/2017 FINDINGS: Brain: No acute intracranial findings are seen. There are no signs of bleeding within the cranium. Ventricles are not dilated. Cortical sulci are prominent. Vascular: Unremarkable. Skull: Unremarkable. Sinuses/Orbits: There is mucosal thickening in ethmoid and left maxillary sinuses. Other: None. IMPRESSION: No acute intracranial findings are seen in noncontrast CT brain. Atrophy. Chronic sinusitis. Electronically Signed   By: PElmer PickerM.D.   On: 10/01/2022 15:14   CT Cervical Spine Wo Contrast  Result Date: 10/01/2022 CLINICAL DATA:  Pain after a fall. EXAM: CT CERVICAL SPINE WITHOUT CONTRAST  TECHNIQUE: Multidetector CT imaging of the cervical spine was performed without intravenous contrast. Multiplanar CT image reconstructions were also generated. RADIATION DOSE REDUCTION: This exam was performed according to the departmental dose-optimization program which includes automated exposure control, adjustment of the mA and/or kV according to patient size and/or use of iterative reconstruction technique. COMPARISON:  None Available. FINDINGS: Alignment: Trace retrolisthesis of C4 on 5 is compatible with the advanced loss of disc space at this level. No findings to suggest traumatic subluxation. Skull base and vertebrae: No acute fracture. No primary bone lesion or focal pathologic process. Soft tissues and spinal canal: No prevertebral fluid or swelling. No visible canal hematoma. Disc levels: Marked loss of disc height C4-5 and C6-7 with more mild loss of disc height noted at C3-4 and C5-6. Upper chest: Negative. Other:  None. IMPRESSION: 1. No evidence for cervical spine fracture or traumatic subluxation. 2. Degenerative changes in the cervical spine as above. Electronically Signed   By: Misty Stanley M.D.   On: 10/01/2022 15:13   DG Lumbar Spine Complete  Result Date: 10/01/2022 CLINICAL DATA:  Fall 2 weeks ago.  Pain. EXAM: LUMBAR SPINE - COMPLETE 4+ VIEW COMPARISON:  None Available. FINDINGS: Mild curvature of the lumbar spine, apex to the left. 5 mm retrolisthesis of L5 versus S1. Trace retrolisthesis of L2 versus L3. No other malalignment. No fractures. Multilevel degenerative disc disease most prominent at L2-3. Lower lumbar facet degenerative changes are noted. Calcified atherosclerotic changes are identified in the abdominal aorta. IMPRESSION: 1. Mild curvature of the lumbar spine, apex to the left. 5 mm retrolisthesis of L5 versus S1. Trace retrolisthesis of L2 versus L3. 2. Degenerative changes as above. 3. Calcified atherosclerotic changes in the abdominal aorta. Electronically Signed   By: Dorise Bullion III M.D.   On: 10/01/2022 15:01      Assessment/Plan 1. Carotid Stenosis  The patient has known carotid stenosis of  left ICA greater then 70%. The patient was referred to [place] and was awaiting an upcoming appointment. Based on her carotid stenosis we will have the patient undergo a CT Angio of her carotid arteries to better evaluate her level of stenosis. We will plan on an intervention on an outpatient basis.   At this time we do not feel her carotid stenosis is the direct cause of her syncope and collapse. We feel that a large contributing cause is likely related to her overuse of her muscle relaxer and opioid medication.   2. Hypertension  Continue hypertension medications as directed by primary team.   3. Alcohol Abuse  Patient notes she went to wedding following the use of 2 baclofen. If patient utilized alcohol at this event it could be a contributing cause to her syncope and collapse.    Plan of care discussed with Dr Leotis Pain and he is in agreement with plan noted above.   Family Communication: None at bedside  Total Time:75 Minutes  I spent 75 minutes in this encounter including personally reviewing extensive medical records, personally reviewing imaging studies and compared to prior scans, counseling the patient, placing orders, coordinating care and performing appropriate documentation  Thank you for allowing [place] to participate in the care of this patient.   Kris Hartmann, NP [facility] [phone] (Office Phone) [phone] (Office Fax) [phone] (Pager)  10/02/2022 2:54 PM  Staff may message me via secure chat in [place]  but this may not receive immediate response,  please page for urgent matters!  Dictation software  was used to generate the above note. Typos may occur and escape review, as with typed/written notes. Any error is purely unintentional.  Please contact me directly for clarity if needed.

## 2022-10-02 NOTE — Assessment & Plan Note (Signed)
-  Nicotine patch 

## 2022-10-02 NOTE — H&P (Signed)
History and Physical    Chief Complaint: Syncope    HISTORY OF PRESENT ILLNESS: Barbara Contreras is an 71 y.o. female presenting with complaints of falls. Family at bedside reports that patient was at a picnic at a wedding and had a syncopal episode where Family member caught her and she recovered transiently.  Patient was confused.  She did hit her head. Patient is intermittently confused difficulty word finding is an alcoholic and has been drinking every day essentially since the age of 11. Patient does smoke.  No blood thinners no bleeding.  No incontinence. Admission requested for syncope and collapse acute kidney injury alcohol abuse and falls.  Pt has PMH as below: Past Medical History:  Diagnosis Date   Arthritis    Asthma    Back pain    Breast cancer ([place]) 2022   Carotid artery stenosis    Carotid stenosis    Coronary artery disease    Depression    Emphysema of lung (HCC)    GERD (gastroesophageal reflux disease)    Heart murmur    Hip pain    History of Lyme disease    HLD (hyperlipidemia)    Hyperlipemia    Hypertension    IBS (irritable bowel syndrome)    IBS (irritable bowel syndrome)    Lyme disease    Pre-diabetes    Sciatica    Sciatica    Seizures (HCC)    Tremor    Vitamin D deficiency    Vitamin D deficiency      Review of Systems  Neurological:  Positive for dizziness and syncope.  All other systems reviewed and are negative.     Allergies  Allergen Reactions   Morphine And Related Itching   Clindamycin Other (See Comments)   Erythromycin     Other reaction(s): Other (see comments)   Other     Other reaction(s): Other (See Comments) Redness and itching   Penicillins Other (See Comments)    TOLERATED CEFAZOLIN PRIOR Reaction: unknown Has patient had a PCN reaction causing immediate rash, facial/tongue/throat swelling, SOB or lightheadedness with hypotension: Unknown Has patient had a PCN reaction causing severe rash involving  mucus membranes or skin necrosis: Unknown Has patient had a PCN reaction that required hospitalization: Unknown Has patient had a PCN reaction occurring within the last 10 years: no If all of the above answers are "NO", then may proceed with Cephalosporin use.   Promethazine-Phenylephrine Other (See Comments)    Muscle spasm   Tape Other (See Comments)    Redness and itching     Past Surgical History:  Procedure Laterality Date   ABDOMINAL HYSTERECTOMY     BREAST BIOPSY Right 07/22/2021   [place] bx 1:00 area, venus marker, IMC tubular features   BREAST BIOPSY Right 07/22/2021   [place] bx 2:00 ribbon marker, fibroadenoma   BREAST LUMPECTOMY Right 08/24/2021   re-excision   BREAST LUMPECTOMY Right 08/12/2021   CAROTID ENDARTERECTOMY Right    CHOLECYSTECTOMY     COLONOSCOPY WITH PROPOFOL N/A 09/13/2018   Procedure: COLONOSCOPY WITH PROPOFOL;  Surgeon: Lollie Sails, MD;  Location: [facility] ENDOSCOPY;  Service: Endoscopy;  Laterality: N/A;   ESOPHAGOGASTRODUODENOSCOPY N/A 05/03/2021   Procedure: ESOPHAGOGASTRODUODENOSCOPY (EGD);  Surgeon: Lesly Rubenstein, MD;  Location: [facility] ENDOSCOPY;  Service: Endoscopy;  Laterality: N/A;   paranasal sinusotomy     PART MASTECTOMY,RADIO FREQUENCY LOCALIZER,AXILLARY SENTINEL NODE BIOPSY Right 08/12/2021   Procedure: PART MASTECTOMY,RADIO FREQUENCY LOCALIZER,AXILLARY SENTINEL NODE BIOPSY;  Surgeon: Benjamine Sprague, DO;  Location: [facility] ORS;  Service: General;  Laterality: Right;   RE-EXCISION OF BREAST CANCER,SUPERIOR MARGINS Right 08/24/2021   Procedure: RE-EXCISION OF BREAST CANCER,SUPERIOR MARGINS;  Surgeon: Benjamine Sprague, DO;  Location: [facility] ORS;  Service: General;  Laterality: Right;      Social History   Socioeconomic History   Marital status: Divorced    Spouse name: Not on file   Number of children: Not on file   Years of education: Not on file   Highest education level: Not on file  Occupational History   Not on file  Tobacco Use   Smoking  status: Every Day    Packs/day: 0.50    Years: 55.00    Total pack years: 27.50    Types: Cigarettes   Smokeless tobacco: Never  Vaping Use   Vaping Use: Never used  Substance and Sexual Activity   Alcohol use: Yes    Alcohol/week: 0.0 standard drinks of alcohol    Comment: occasional   Drug use: Yes    Types: Marijuana   Sexual activity: Not on file  Other Topics Concern   Not on file  Social History Narrative   [place] ~25-30 mins; lives by self; no children/husband; sister near by. Smoker 1ppd/marijuana. Alcohol- beer/liqqor; retd from sales.    Social Determinants of Health   Financial Resource Strain: Not on file  Food Insecurity: Not on file  Transportation Needs: Not on file  Physical Activity: Not on file  Stress: Not on file  Social Connections: Not on file      CURRENT MEDS:    Current Facility-Administered Medications (Cardiovascular):    amLODipine (NORVASC) tablet 5 mg  Current Outpatient Medications (Cardiovascular):    amLODipine (NORVASC) 5 MG tablet, Take 5 mg by mouth daily.   atorvastatin (LIPITOR) 80 MG tablet, Take 80 mg by mouth daily.   lisinopril-hydrochlorothiazide (PRINZIDE,ZESTORETIC) 20-12.5 MG tablet, Take 1 tablet by mouth in the morning and at bedtime.   atorvastatin (LIPITOR) 40 MG tablet, Take 40 mg by mouth every morning. (Patient not taking: Reported on 10/01/2022)  Current Facility-Administered Medications (Respiratory):    albuterol (PROVENTIL) (2.5 MG/3ML) 0.083% nebulizer solution 3 mL   tiotropium (SPIRIVA) inhalation capsule ([facility] use ONLY) 18 mcg  Current Outpatient Medications (Respiratory):    albuterol (VENTOLIN HFA) 108 (90 Base) MCG/ACT inhaler, Inhale 1-2 puffs into the lungs every 6 (six) hours as needed for wheezing or shortness of breath.   fluticasone (FLONASE) 50 MCG/ACT nasal spray, Place 2 sprays into both nostrils daily as needed for allergies. (Patient not taking: Reported on 10/01/2022)   montelukast  (SINGULAIR) 10 MG tablet, Take 10 mg by mouth as needed. (Patient not taking: Reported on 10/01/2022)   tiotropium (SPIRIVA) 18 MCG inhalation capsule, Place 18 mcg into inhaler and inhale every morning. (Patient not taking: Reported on 10/01/2022)  Current Facility-Administered Medications (Analgesics):    acetaminophen (TYLENOL) tablet 650 mg **OR** acetaminophen (TYLENOL) suppository 650 mg   aspirin EC tablet 81 mg  Current Outpatient Medications (Analgesics):    aspirin EC 81 MG tablet, Take 81 mg by mouth daily.   ibuprofen (ADVIL) 800 MG tablet, Take 1 tablet (800 mg total) by mouth every 8 (eight) hours as needed for mild pain or moderate pain. (Patient not taking: Reported on 10/01/2022)  Current Facility-Administered Medications (Hematological):    folic acid (FOLVITE) tablet 1 mg   heparin injection 5,000 Units  Current Outpatient Medications (Hematological):    vitamin B-12 (CYANOCOBALAMIN) 1000 MCG tablet, Take 1,000 mcg  by mouth daily.  Current Facility-Administered Medications (Other):    0.9 %  sodium chloride infusion   anastrozole (ARIMIDEX) tablet 1 mg   LORazepam (ATIVAN) tablet 1-4 mg **OR** LORazepam (ATIVAN) injection 1-4 mg   multivitamin with minerals tablet 1 tablet   nicotine (NICODERM CQ - dosed in mg/24 hours) patch 21 mg   pantoprazole (PROTONIX) injection 40 mg   sodium chloride flush (NS) 0.9 % injection 3 mL   thiamine (VITAMIN B1) tablet 100 mg **OR** thiamine (VITAMIN B1) injection 100 mg   venlafaxine XR (EFFEXOR-XR) 24 hr capsule 75 mg  Current Outpatient Medications (Other):    anastrozole (ARIMIDEX) 1 MG tablet, TAKE 1 TABLET BY MOUTH EVERY DAY   baclofen (LIORESAL) 10 MG tablet, Take 5-10 mg by mouth at bedtime as needed for muscle spasms.   calcium carbonate (TUMS EX) 750 MG chewable tablet, Chew 1 tablet by mouth daily as needed for heartburn.   Cholecalciferol (VITAMIN D3) 10 MCG (400 UNIT) tablet, Take 400 Units by mouth daily.    clobetasol (TEMOVATE) 0.05 % external solution, Apply 1 Application topically daily.   dicyclomine (BENTYL) 10 MG capsule, Take 10 mg by mouth in the morning and at bedtime.   docusate sodium (COLACE) 100 MG capsule, Take 100 mg by mouth 2 (two) times daily.   famotidine (PEPCID) 20 MG tablet, Take 20 mg by mouth 2 (two) times daily.   gabapentin (NEURONTIN) 600 MG tablet, Take 600 mg by mouth 3 (three) times daily.   ketoconazole (NIZORAL) 2 % shampoo, Apply 1 application topically once a week.   Multiple Vitamin (MULTIVITAMIN WITH MINERALS) TABS tablet, Take 1 tablet by mouth daily.   venlafaxine XR (EFFEXOR-XR) 75 MG 24 hr capsule, Take 75 mg by mouth 3 (three) times daily.   Zinc Sulfate (ZINC 15 PO), Take 15 mg by mouth daily.   doxycycline (MONODOX) 100 MG capsule, Take 100 mg by mouth 2 (two) times daily. (Patient not taking: Reported on 10/24/2021)    ED Course: Pt in Ed patient is alert oriented perseverates with answers difficulty with recall. Vitals:   10/01/22 1303 10/01/22 2014 10/01/22 2329 10/01/22 2336  BP: 115/63 130/67 (!) 154/77 (!) 154/77  Pulse: 67 78 91 90  Resp: '18 18 18   '$ Temp: 97.8 F (36.6 C) 98 F (36.7 C) 98.1 F (36.7 C)   TempSrc: Oral Oral Oral   SpO2: 97% 95% 93%   Weight:   74 kg   Height:   '5\' 5"'$  (1.651 m)    No intake/output data recorded. SpO2: 93 % Blood work in ed shows  Results for orders placed or performed during the hospital encounter of 10/01/22 (from the past 48 hour(s))  CBC with Differential     Status: None   Collection Time: 10/01/22  5:23 PM  Result Value Ref Range   WBC 8.9 4.0 - 10.5 K/uL   RBC 4.82 3.87 - 5.11 MIL/uL   Hemoglobin 14.7 12.0 - 15.0 g/dL   HCT 44.6 36.0 - 46.0 %   MCV 92.5 80.0 - 100.0 fL   MCH 30.5 26.0 - 34.0 pg   MCHC 33.0 30.0 - 36.0 g/dL   RDW 13.7 11.5 - 15.5 %   Platelets 173 150 - 400 K/uL   nRBC 0.0 0.0 - 0.2 %   Neutrophils Relative % 74 %   Neutro Abs 6.5 1.7 - 7.7 K/uL   Lymphocytes Relative  17 %   Lymphs Abs 1.5 0.7 - 4.0 K/uL  Monocytes Relative 6 %   Monocytes Absolute 0.6 0.1 - 1.0 K/uL   Eosinophils Relative 2 %   Eosinophils Absolute 0.2 0.0 - 0.5 K/uL   Basophils Relative 1 %   Basophils Absolute 0.1 0.0 - 0.1 K/uL   Immature Granulocytes 0 %   Abs Immature Granulocytes 0.03 0.00 - 0.07 K/uL    Comment: Performed at [facility], [address], [place], [place] [address]  Comprehensive metabolic panel     Status: Abnormal   Collection Time: 10/01/22  5:23 PM  Result Value Ref Range   Sodium 141 135 - 145 mmol/L   Potassium 3.8 3.5 - 5.1 mmol/L   Chloride 101 98 - 111 mmol/L   CO2 28 22 - 32 mmol/L   Glucose, Bld 96 70 - 99 mg/dL    Comment: Glucose reference range applies only to samples taken after fasting for at least 8 hours.   BUN 42 (H) 8 - 23 mg/dL   Creatinine, Ser 1.33 (H) 0.44 - 1.00 mg/dL   Calcium 9.4 8.9 - 10.3 mg/dL   Total Protein 7.5 6.5 - 8.1 g/dL   Albumin 4.1 3.5 - 5.0 g/dL   AST 54 (H) 15 - 41 U/L   ALT 33 0 - 44 U/L   Alkaline Phosphatase 67 38 - 126 U/L   Total Bilirubin 1.4 (H) 0.3 - 1.2 mg/dL   GFR, Estimated 43 (L) >60 mL/min    Comment: (NOTE) Calculated using the CKD-EPI Creatinine Equation (2021)    Anion gap 12 5 - 15    Comment: Performed at [facility], [address] [place], [place] [address]  Ethanol     Status: None   Collection Time: 10/01/22  5:23 PM  Result Value Ref Range   Alcohol, Ethyl (B) <10 <10 mg/dL    Comment: (NOTE) Lowest detectable limit for serum alcohol is 10 mg/dL.  For medical purposes only. Performed at [facility], [address] [place], [place] [address]   Urinalysis, Routine w reflex microscopic     Status: Abnormal   Collection Time: 10/01/22  5:23 PM  Result Value Ref Range   Color, Urine YELLOW (A) YELLOW   APPearance CLEAR (A) CLEAR   Specific Gravity, Urine 1.017 1.005 - 1.030   pH 5.0 5.0 - 8.0   Glucose, UA NEGATIVE NEGATIVE mg/dL   Hgb urine  dipstick NEGATIVE NEGATIVE   Bilirubin Urine NEGATIVE NEGATIVE   Ketones, ur 5 (A) NEGATIVE mg/dL   Protein, ur NEGATIVE NEGATIVE mg/dL   Nitrite NEGATIVE NEGATIVE   Leukocytes,Ua NEGATIVE NEGATIVE    Comment: Performed at [facility], [address], [place], [place] [address]  Urine Drug Screen, Qualitative     Status: Abnormal   Collection Time: 10/01/22  5:23 PM  Result Value Ref Range   Tricyclic, Ur Screen NONE DETECTED NONE DETECTED   Amphetamines, Ur Screen NONE DETECTED NONE DETECTED   MDMA (Ecstasy)Ur Screen NONE DETECTED NONE DETECTED   Cocaine Metabolite,Ur [place] NONE DETECTED NONE DETECTED   Opiate, Ur Screen NONE DETECTED NONE DETECTED   Phencyclidine (PCP) Ur S NONE DETECTED NONE DETECTED   Cannabinoid 50 Ng, Ur [place] POSITIVE (A) NONE DETECTED   Barbiturates, Ur Screen NONE DETECTED NONE DETECTED   Benzodiazepine, Ur Scrn NONE DETECTED NONE DETECTED   Methadone Scn, Ur NONE DETECTED NONE DETECTED    Comment: (NOTE) Tricyclics + metabolites, urine    Cutoff 1000 ng/mL Amphetamines + metabolites, urine  Cutoff 1000 ng/mL  MDMA (Ecstasy), urine              Cutoff 500 ng/mL Cocaine Metabolite, urine          Cutoff 300 ng/mL Opiate + metabolites, urine        Cutoff 300 ng/mL Phencyclidine (PCP), urine         Cutoff 25 ng/mL Cannabinoid, urine                 Cutoff 50 ng/mL Barbiturates + metabolites, urine  Cutoff 200 ng/mL Benzodiazepine, urine              Cutoff 200 ng/mL Methadone, urine                   Cutoff 300 ng/mL  The urine drug screen provides only a preliminary, unconfirmed analytical test result and should not be used for non-medical purposes. Clinical consideration and professional judgment should be applied to any positive drug screen result due to possible interfering substances. A more specific alternate chemical method must be used in order to obtain a confirmed analytical result. Gas chromatography / mass spectrometry (GC/MS) is the  preferred confirm atory method. Performed at [facility], [place], [place] [address]   Troponin I (High Sensitivity)     Status: Abnormal   Collection Time: 10/01/22  5:23 PM  Result Value Ref Range   Troponin I (High Sensitivity) 54 (H) <18 ng/L    Comment: (NOTE) Elevated high sensitivity troponin I (hsTnI) values and significant  changes across serial measurements may suggest ACS but many other  chronic and acute conditions are known to elevate hsTnI results.  Refer to the "Links" section for chest pain algorithms and additional  guidance. Performed at [facility], [address] [place], [place] [address]   Magnesium     Status: None   Collection Time: 10/01/22  5:25 PM  Result Value Ref Range   Magnesium 2.3 1.7 - 2.4 mg/dL    Comment: Performed at [facility], [place], [place] [address]  Troponin I (High Sensitivity)     Status: Abnormal   Collection Time: 10/01/22  7:43 PM  Result Value Ref Range   Troponin I (High Sensitivity) 41 (H) <18 ng/L    Comment: (NOTE) Elevated high sensitivity troponin I (hsTnI) values and significant  changes across serial measurements may suggest ACS but many other  chronic and acute conditions are known to elevate hsTnI results.  Refer to the "Links" section for chest pain algorithms and additional  guidance. Performed at [facility], [address] [place], [place] [address]     In Ed pt received  Meds ordered this encounter  Medications   thiamine (VITAMIN B1) injection 500 mg   iohexol (OMNIPAQUE) 300 MG/ML solution 80 mL   albuterol (PROVENTIL) (2.5 MG/3ML) 0.083% nebulizer solution 3 mL   anastrozole (ARIMIDEX) tablet 1 mg   aspirin EC tablet 81 mg   tiotropium (SPIRIVA) inhalation capsule ([facility] use ONLY) 18 mcg   DISCONTD: venlafaxine XR (EFFEXOR-XR) 24 hr capsule 225 mg   DISCONTD: amLODipine (NORVASC) tablet 10 mg   OR Linked Order Group     LORazepam (ATIVAN) tablet 1-4 mg     Order Specific Question:   CIWA-AR < 5 =     Answer:   0 mg     Order Specific Question:   CIWA-AR 5 -10 =     Answer:   1 mg  Order Specific Question:   CIWA-AR 11 -15 =     Answer:   2 mg     Order Specific Question:   CIWA-AR 16 -20 =     Answer:   3 mg     Order Specific Question:   CIWA-AR 16 -20 =     Answer:   Recheck CIWA-AR in 1 hour; if > 20 notify MD     Order Specific Question:   CIWA-AR > 20 =     Answer:   4 mg     Order Specific Question:   CIWA-AR > 20 =     Answer:   Call Rapid Response    LORazepam (ATIVAN) injection 1-4 mg     Order Specific Question:   CIWA-AR < 5 =     Answer:   0 mg     Order Specific Question:   CIWA-AR 5 -10 =     Answer:   1 mg     Order Specific Question:   CIWA-AR 11 -15 =     Answer:   2 mg     Order Specific Question:   CIWA-AR 16 -20 =     Answer:   3 mg     Order Specific Question:   CIWA-AR 16 -20 =     Answer:   Recheck CIWA-AR in 1 hour; if > 20 notify MD     Order Specific Question:   CIWA-AR > 20 =     Answer:   4 mg     Order Specific Question:   CIWA-AR > 20 =     Answer:   Call Rapid Response   OR Linked Order Group    thiamine (VITAMIN B1) tablet 100 mg    thiamine (VITAMIN B1) injection 811 mg   folic acid (FOLVITE) tablet 1 mg   multivitamin with minerals tablet 1 tablet   heparin injection 5,000 Units   sodium chloride flush (NS) 0.9 % injection 3 mL   OR Linked Order Group    acetaminophen (TYLENOL) tablet 650 mg    acetaminophen (TYLENOL) suppository 650 mg   DISCONTD: lactated ringers infusion   nicotine (NICODERM CQ - dosed in mg/24 hours) patch 21 mg   pantoprazole (PROTONIX) injection 40 mg   0.9 %  sodium chloride infusion   venlafaxine XR (EFFEXOR-XR) 24 hr capsule 75 mg   amLODipine (NORVASC) tablet 5 mg    Unresulted Labs (From admission, onward)     Start     Ordered   10/02/22 0500  Comprehensive metabolic panel  Tomorrow morning,   STAT        10/01/22  2057   10/02/22 0500  CBC  Tomorrow morning,   STAT        10/01/22 2057   10/02/22 0500  Magnesium  Tomorrow morning,   R        10/01/22 2059   10/02/22 0500  Phosphorus  Tomorrow morning,   R        10/01/22 2059   10/01/22 2350  CBC  Once,   STAT        10/01/22 2350   10/01/22 2350  CK  Once,   R        10/01/22 2350             Admission Imaging : MR BRAIN WO CONTRAST  Result Date: 10/01/2022 CLINICAL DATA:  Delirium, syncope/presyncope EXAM: MRI HEAD WITHOUT CONTRAST TECHNIQUE: Multiplanar, multiecho pulse sequences of  the brain and surrounding structures were obtained without intravenous contrast. COMPARISON:  12/29/2019 MRI head, correlation is also made with 10/01/2022 CT head FINDINGS: Brain: No restricted diffusion to suggest acute or subacute infarct. No acute hemorrhage, mass, mass effect, or midline shift. No hydrocephalus or extra-axial collection. No hemosiderin deposition to suggest remote hemorrhage. Vascular: Normal arterial flow voids. Skull and upper cervical spine: Normal marrow signal. Sinuses/Orbits: Clear paranasal sinuses. The orbits are unremarkable. Other: Trace fluid in the right mastoid tip. IMPRESSION: No acute intracranial process. No evidence of acute or subacute infarct. Electronically Signed   By: Merilyn Baba M.D.   On: 10/01/2022 23:11   CT CHEST ABDOMEN PELVIS W CONTRAST  Result Date: 10/01/2022 CLINICAL DATA:  Trauma. EXAM: CT CHEST, ABDOMEN, AND PELVIS WITH CONTRAST TECHNIQUE: Multidetector CT imaging of the chest, abdomen and pelvis was performed following the standard protocol during bolus administration of intravenous contrast. RADIATION DOSE REDUCTION: This exam was performed according to the departmental dose-optimization program which includes automated exposure control, adjustment of the mA and/or kV according to patient size and/or use of iterative reconstruction technique. CONTRAST:  52m OMNIPAQUE IOHEXOL 300 MG/ML  SOLN COMPARISON:   Chest CT dated 06/27/2022. FINDINGS: CT CHEST FINDINGS Cardiovascular: There is no cardiomegaly or pericardial effusion there is coronary vascular calcification and calcification of the mitral annulus. Moderate atherosclerotic calcification of the thoracic aorta. No aneurysmal dilatation or dissection. The origins of the great vessels of the aortic arch and the central pulmonary arteries appear patent. Mediastinum/Nodes: No hilar or mediastinal adenopathy. The esophagus is grossly unremarkable. No mediastinal fluid collection. Lungs/Pleura: Bibasilar linear atelectasis/scarring. There is a 3 mm left upper lobe calcified granuloma. No focal consolidation or pneumothorax. Trace right pleural effusion may be present. Musculoskeletal: Nondisplaced fracture of the posterior right eleventh rib at the costovertebral articulation as well as fracture of the posterior right eleventh rib. Displaced fracture of the posterior right tenth rib. CT ABDOMEN PELVIS FINDINGS No intra-abdominal free air or free fluid. Hepatobiliary: Mild fatty liver. No biliary dilatation. Cholecystectomy. No retained calcified stone noted in the central CBD. Pancreas: Unremarkable. No pancreatic ductal dilatation or surrounding inflammatory changes. Spleen: Normal in size without focal abnormality. Adrenals/Urinary Tract: The right adrenal gland is unremarkable. Indeterminate 2 cm left adrenal nodule as seen on the prior CT as adenoma. No follow-up. There is no hydronephrosis on either side. There is symmetric enhancement and excretion of contrast by both kidneys. The visualized ureters and urinary bladder appear unremarkable. Stomach/Bowel: There is severe sigmoid diverticulosis and scattered colonic diverticula without active inflammatory changes. There is no bowel obstruction or active inflammation. The appendix is normal. Vascular/Lymphatic: Advanced aortoiliac atherosclerotic disease. The IVC is unremarkable. No portal venous gas. There is no  adenopathy. Reproductive: Hysterectomy.  No adnexal masses. Other: None Musculoskeletal: Minimally displaced fractures of the right L1 and L2 transverse processes. There is degenerative changes of the spine and hips. No acute osseous pathology. Grade 1 L4-L5 anterolisthesis. IMPRESSION: 1. Fractures of the posterior right eleventh and tenth ribs. No pneumothorax. 2. Minimally displaced fractures of the right L1 and L2 transverse processes. 3. Colonic diverticulosis. No bowel obstruction. Normal appendix. 4.  Aortic Atherosclerosis (ICD10-I70.0). Electronically Signed   By: AAnner CreteM.D.   On: 10/01/2022 19:29   CT Head Wo Contrast  Result Date: 10/01/2022 CLINICAL DATA:  Trauma, fall EXAM: CT HEAD WITHOUT CONTRAST TECHNIQUE: Contiguous axial images were obtained from the base of the skull through the vertex without intravenous contrast. RADIATION DOSE REDUCTION: This  exam was performed according to the departmental dose-optimization program which includes automated exposure control, adjustment of the mA and/or kV according to patient size and/or use of iterative reconstruction technique. COMPARISON:  06/16/2017 FINDINGS: Brain: No acute intracranial findings are seen. There are no signs of bleeding within the cranium. Ventricles are not dilated. Cortical sulci are prominent. Vascular: Unremarkable. Skull: Unremarkable. Sinuses/Orbits: There is mucosal thickening in ethmoid and left maxillary sinuses. Other: None. IMPRESSION: No acute intracranial findings are seen in noncontrast CT brain. Atrophy. Chronic sinusitis. Electronically Signed   By: Elmer Picker M.D.   On: 10/01/2022 15:14   CT Cervical Spine Wo Contrast  Result Date: 10/01/2022 CLINICAL DATA:  Pain after a fall. EXAM: CT CERVICAL SPINE WITHOUT CONTRAST TECHNIQUE: Multidetector CT imaging of the cervical spine was performed without intravenous contrast. Multiplanar CT image reconstructions were also generated. RADIATION DOSE  REDUCTION: This exam was performed according to the departmental dose-optimization program which includes automated exposure control, adjustment of the mA and/or kV according to patient size and/or use of iterative reconstruction technique. COMPARISON:  None Available. FINDINGS: Alignment: Trace retrolisthesis of C4 on 5 is compatible with the advanced loss of disc space at this level. No findings to suggest traumatic subluxation. Skull base and vertebrae: No acute fracture. No primary bone lesion or focal pathologic process. Soft tissues and spinal canal: No prevertebral fluid or swelling. No visible canal hematoma. Disc levels: Marked loss of disc height C4-5 and C6-7 with more mild loss of disc height noted at C3-4 and C5-6. Upper chest: Negative. Other: None. IMPRESSION: 1. No evidence for cervical spine fracture or traumatic subluxation. 2. Degenerative changes in the cervical spine as above. Electronically Signed   By: Misty Stanley M.D.   On: 10/01/2022 15:13   DG Lumbar Spine Complete  Result Date: 10/01/2022 CLINICAL DATA:  Fall 2 weeks ago.  Pain. EXAM: LUMBAR SPINE - COMPLETE 4+ VIEW COMPARISON:  None Available. FINDINGS: Mild curvature of the lumbar spine, apex to the left. 5 mm retrolisthesis of L5 versus S1. Trace retrolisthesis of L2 versus L3. No other malalignment. No fractures. Multilevel degenerative disc disease most prominent at L2-3. Lower lumbar facet degenerative changes are noted. Calcified atherosclerotic changes are identified in the abdominal aorta. IMPRESSION: 1. Mild curvature of the lumbar spine, apex to the left. 5 mm retrolisthesis of L5 versus S1. Trace retrolisthesis of L2 versus L3. 2. Degenerative changes as above. 3. Calcified atherosclerotic changes in the abdominal aorta. Electronically Signed   By: Dorise Bullion III M.D.   On: 10/01/2022 15:01      Physical Examination: Vitals:   10/01/22 1303 10/01/22 2014 10/01/22 2329 10/01/22 2336  BP: 115/63 130/67 (!)  154/77 (!) 154/77  Pulse: 67 78 91 90  Temp: 97.8 F (36.6 C) 98 F (36.7 C) 98.1 F (36.7 C)   Resp: '18 18 18   '$ Height:   '5\' 5"'$  (1.651 m)   Weight:   74 kg   SpO2: 97% 95% 93%   TempSrc: Oral Oral Oral   BMI (Calculated):   27.15    Physical Exam Vitals and nursing note reviewed.  Constitutional:      General: She is not in acute distress.    Appearance: Normal appearance. She is not ill-appearing, toxic-appearing or diaphoretic.  HENT:     Head: Normocephalic and atraumatic.     Right Ear: Hearing and external ear normal.     Left Ear: Hearing and external ear normal.     Nose:  Nose normal. No nasal deformity.     Mouth/Throat:     Lips: Pink.     Mouth: Mucous membranes are moist.     Tongue: No lesions.     Pharynx: Oropharynx is clear.  Eyes:     Extraocular Movements: Extraocular movements intact.     Pupils: Pupils are equal, round, and reactive to light.  Neck:     Vascular: No carotid bruit.  Cardiovascular:     Rate and Rhythm: Normal rate and regular rhythm.     Pulses: Normal pulses.     Heart sounds: Normal heart sounds.  Pulmonary:     Effort: Pulmonary effort is normal.     Breath sounds: Normal breath sounds.  Abdominal:     General: Bowel sounds are normal. There is no distension.     Palpations: Abdomen is soft. There is no mass.     Tenderness: There is no abdominal tenderness. There is no guarding.     Hernia: No hernia is present.  Musculoskeletal:     Right lower leg: No edema.     Left lower leg: No edema.  Skin:    General: Skin is warm.  Neurological:     General: No focal deficit present.     Mental Status: She is alert and oriented to person, place, and time.     Cranial Nerves: Cranial nerves 2-12 are intact.     Motor: Motor function is intact.  Psychiatric:        Attention and Perception: Attention normal.        Mood and Affect: Mood normal.        Speech: Speech normal.        Behavior: Behavior normal. Behavior is  cooperative.        Cognition and Memory: Cognition normal.      Assessment and Plan: * Syncope and collapse Differentials include TIA CVA, neuropathy, hypotension, dehydration.,  Cardiac dysrhythmias. We will admit patient on telemetry monitored floor. We will obtain an MRI carotid Dopplers and a 2D echocardiogram. We will obtain a B12 level. Fall precautions aspiration precautions and seizure precautions.   Elevated troponin Troponin is mildly elevated I do not suspect it is a cardiac source. From the initial troponins of 54 is gone down to 41. Suspect may be related to the AKI that the patient has.   HTN (hypertension) Vitals:   10/01/22 1303 10/01/22 2014 10/01/22 2329 10/01/22 2336  BP: 115/63 130/67 (!) 154/77 (!) 154/77  Suspect orthostatic hypotension may have been a component to the patient's transient syncopal and collapse episode. Orthostatic vitals. We will continue patient on amlodipine 5 mg 2 times a day. We will discontinue lisinopril and HCTZ, expect patient will be well controlled on HCTZ 6.25 mg upon discharge along with 5 mg of amlodipine alone for systolic hypertension secondary to her smoking history. We will defer to primary care team for further tailoring of blood pressure regimen upon discharge.   AKI (acute kidney injury) ([employer]) Lab Results  Component Value Date   CREATININE 1.33 (H) 10/01/2022   CREATININE 0.88 08/03/2021   CREATININE 0.74 06/17/2017  Will follow, avoid contrast, renally dose all needed medications. We will hold patient's lisinopril and HCTZ.   Alcohol abuse CIWA precautions Aspiration/fall/seizure precautions Thiamine 100 mg daily. IV PPI. Counseling once patient is stable.   Tobacco use disorder Nicotine patch.    DVT prophylaxis:  Heparin  Code Status:  Full code  Family Communication:  Gant,Teresa (Sister)  [phone]    Disposition Plan:  To be determined  Consults called:  None  Admission  status: Observation  Unit/ Expected LOS: Med telemetry/progressive depending on bed availability/2 days   Para Skeans MD [facility]  6 PM- 2 AM. Please contact me via secure Chat 6 PM-2 AM. [phone] ( Pager ) To contact the [facility] Attending or Consulting provider [place] or covering provider during after hours [place], for this patient.   Check the care team in [facility] and look for a) attending/consulting [facility] provider listed and b) the [facility] team listed Log into [email or web address] and use [place]'s universal password to access. If you do not have the password, please contact the hospital operator. Locate the [facility] provider you are looking for under [facility] and page to a number that you can be directly reached. If you still have difficulty reaching the provider, please page the [facility] (Director on Call) for the Hospitalists listed on amion for assistance. [email or web address] 10/02/2022, 12:47 AM

## 2022-10-02 NOTE — Assessment & Plan Note (Signed)
Vitals:   10/01/22 1303 10/01/22 2014 10/01/22 2329 10/01/22 2336  BP: 115/63 130/67 (!) 154/77 (!) 154/77  Suspect orthostatic hypotension may have been a component to the patient's transient syncopal and collapse episode. Orthostatic vitals. We will continue patient on amlodipine 5 mg 2 times a day. We will discontinue lisinopril and HCTZ, expect patient will be well controlled on HCTZ 6.25 mg upon discharge along with 5 mg of amlodipine alone for systolic hypertension secondary to her smoking history. We will defer to primary care team for further tailoring of blood pressure regimen upon discharge.

## 2022-10-02 NOTE — Progress Notes (Signed)
Progress Note    Barbara Contreras  TMH:[id number] DOB: 10-30-1951  DOA: 10/01/2022 PCP: Valera Castle, MD      Brief Narrative:    Medical records reviewed and are as summarized below:  Barbara Contreras is a 71 y.o. female with multiple medical problems including coronary artery disease, carotid artery stenosis, alcohol use disorder, chronic low back pain, prediabetes, vitamin D deficiency, Lyme disease, depression IBS, seizures, unsteady gait/ambulatory dysfunction at baseline because of chronic back pain.  She presented to the hospital because of multiple falls and confusion.  She said she attended a wedding on 09/30/2022.  She said she fell a day before the wedding and fell a second time in the evening after the wedding at home (fell between her bed and the dresser).  Chart review shows inconsistencies in the timing of her falls.  Reportedly, she had a syncopal episode at a wedding picnic. She had been taking 2 baclofen tablets and a pain pill prior to going to the waiting       Assessment/Plan:   Principal Problem:   Syncope and collapse Active Problems:   Elevated troponin   HTN (hypertension)   Tobacco use disorder   Alcohol abuse   AKI (acute kidney injury) ([place])   Closed fracture of rib of right side   Closed L1 vertebral fracture (HCC)   Closed L2 vertebral fracture (HCC)   Fall   Body mass index is 27.15 kg/m.   Acute toxic metabolic encephalopathy/confusion: Probably related to use of baclofen and alcohol.  S/p syncope: Probably related to baclofen and alcohol  Right 10th and 11th rib fractures, fracture of the transverse processes of L1-L2, s/p falls: Continue analgesics as needed for pain.  Case discussed with Dr. Kathlene Cote, interventional radiologist.  There is no indication for kyphoplasty in this case.  Consult PT and OT.  Severe left ICA stenosis on carotid ultrasound: Continue aspirin.  Patient known to have carotid artery stenosis.   Consulted Dr. Lucky Cowboy, vascular surgeon.  Outpatient follow-up was recommended for further management.  AKI: Continue IV fluids for hydration.  Repeat BMP tomorrow.  Elevated troponin: This is likely due to demand ischemia.  Alcohol use disorder: Use Ativan as needed for alcohol withdrawal syndrome  Other comorbidities include tobacco use disorder, hypertension, chronic back pain, history of breast cancer, anastrozole   Diet Order             Diet Heart Room service appropriate? Yes; Fluid consistency: Thin  Diet effective now                            Consultants: Vascular surgeon  Procedures: None    Medications:    amLODipine  5 mg Oral BID   anastrozole  1 mg Oral Daily   aspirin EC  81 mg Oral Daily   folic acid  1 mg Oral Daily   heparin  5,000 Units Subcutaneous Q12H   multivitamin with minerals  1 tablet Oral Daily   nicotine  21 mg Transdermal Daily   pantoprazole (PROTONIX) IV  40 mg Intravenous Q12H   thiamine  100 mg Oral Daily   Or   thiamine  100 mg Intravenous Daily   tiotropium  18 mcg Inhalation BH-q7a   venlafaxine XR  75 mg Oral TID   Continuous Infusions:  sodium chloride 75 mL/hr at 10/02/22 0038     Anti-infectives (From admission, onward)    None  Family Communication/Anticipated D/C date and plan/Code Status   DVT prophylaxis: heparin injection 5,000 Units Start: 10/01/22 2200     Code Status: Full Code  Family Communication: None Disposition Plan: Discharge home versus SNF     Status is: Observation The patient will require care spanning > 2 midnights and should be moved to inpatient because: Pain is uncontrolled, may need to discharge to SNF       Subjective:   Interval events noted.  She complains of severe low back pain.  Objective:    Vitals:   10/02/22 0513 10/02/22 0700 10/02/22 0800 10/02/22 1200  BP: 136/65 (!) 168/71 104/81 (!) 142/92  Pulse: 76 83 85 70  Resp: '17 16  17 14  '$ Temp: 98.5 F (36.9 C)  98.6 F (37 C) 98.2 F (36.8 C)  TempSrc: Oral  Oral Oral  SpO2: 97% 98% 97% 98%  Weight:      Height:       No data found.  No intake or output data in the 24 hours ending 10/02/22 1600 Filed Weights   10/01/22 2329  Weight: 74 kg    Exam:  GEN: NAD SKIN: Warm and dry.  Bruising on the right lower back EYES: EOMI ENT: MMM CV: RRR PULM: CTA B ABD: soft, ND, NT, +BS CNS: AAO x 3, non focal EXT: No edema or tenderness MSK: Right lumbar paraspinal tenderness         Data Reviewed:   I have personally reviewed following labs and imaging studies:  Labs: Labs show the following:   Basic Metabolic Panel: Recent Labs  Lab 10/01/22 1723 10/01/22 1725 10/02/22 0630  NA 141  --  140  K 3.8  --  3.8  CL 101  --  104  CO2 28  --  26  GLUCOSE 96  --  96  BUN 42*  --  35*  CREATININE 1.33*  --  1.03*  CALCIUM 9.4  --  8.7*  MG  --  2.3 2.2  PHOS  --   --  3.4   GFR Estimated Creatinine Clearance: 49.7 mL/min (A) (by C-G formula based on SCr of 1.03 mg/dL (H)). Liver Function Tests: Recent Labs  Lab 10/01/22 1723 10/02/22 0630  AST 54* 39  ALT 33 25  ALKPHOS 67 59  BILITOT 1.4* 1.4*  PROT 7.5 6.5  ALBUMIN 4.1 3.5   No results for input(s): "LIPASE", "AMYLASE" in the last 168 hours. No results for input(s): "AMMONIA" in the last 168 hours. Coagulation profile No results for input(s): "INR", "PROTIME" in the last 168 hours.  CBC: Recent Labs  Lab 10/01/22 1723 10/02/22 0505  WBC 8.9 6.6  NEUTROABS 6.5  --   HGB 14.7 14.4  HCT 44.6 43.7  MCV 92.5 92.2  PLT 173 166   Cardiac Enzymes: Recent Labs  Lab 10/02/22 0506  CKTOTAL 299*   BNP (last 3 results) No results for input(s): "PROBNP" in the last 8760 hours. CBG: No results for input(s): "GLUCAP" in the last 168 hours. D-Dimer: No results for input(s): "DDIMER" in the last 72 hours. Hgb A1c: No results for input(s): "HGBA1C" in the last 72 hours. Lipid  Profile: No results for input(s): "CHOL", "HDL", "LDLCALC", "TRIG", "CHOLHDL", "LDLDIRECT" in the last 72 hours. Thyroid function studies: No results for input(s): "TSH", "T4TOTAL", "T3FREE", "THYROIDAB" in the last 72 hours.  Invalid input(s): "FREET3" Anemia work up: No results for input(s): "VITAMINB12", "FOLATE", "FERRITIN", "TIBC", "IRON", "RETICCTPCT" in the last 72 hours. Sepsis  Labs: Recent Labs  Lab 10/01/22 1723 10/02/22 0505  WBC 8.9 6.6    Microbiology No results found for this or any previous visit (from the past 240 hour(s)).  Procedures and diagnostic studies:  US Carotid Bilateral  Result Date: 10/02/2022 CLINICAL DATA:  Syncope and collapse. History prior right carotid endarterectomy and carotid atherosclerosis. Hypertension, hyperlipidemia and smoking history. EXAM: BILATERAL CAROTID DUPLEX ULTRASOUND TECHNIQUE: Pearline Cables scale imaging, color Doppler and duplex ultrasound were performed of bilateral carotid and vertebral arteries in the neck. COMPARISON:  Prior carotid duplex ultrasound at [facility] on 07/25/2022 FINDINGS: Criteria: Quantification of carotid stenosis is based on velocity parameters that correlate the residual internal carotid diameter with NASCET-based stenosis levels, using the diameter of the distal internal carotid lumen as the denominator for stenosis measurement. The following velocity measurements were obtained: RIGHT ICA:  181/28 cm/sec CCA:  73/41 cm/sec SYSTOLIC ICA/CCA RATIO:  2.0 ECA:  123 cm/sec LEFT ICA:  422/117 cm/sec CCA:  93/79 cm/sec SYSTOLIC ICA/CCA RATIO:  5.1 ECA:  164 cm/sec RIGHT CAROTID ARTERY: Moderate, predominately calcified plaque again noted at the level of the distal bulb and proximal right ICA. Estimated right ICA stenosis is 50-69%. RIGHT VERTEBRAL ARTERY: Antegrade flow with normal waveform and velocity. LEFT CAROTID ARTERY: Large amount of predominately calcified plaque is again noted at the level of the distal bulb and  proximal left ICA. Markedly elevated velocities are consistent with a greater than 70% stenosis and likely critical stenosis. LEFT VERTEBRAL ARTERY: Antegrade flow with normal waveform and velocity. IMPRESSION: 1. Large amount of plaque at the level of the left carotid bulb and proximal left ICA. Markedly elevated velocities are consistent with a greater than 70% stenosis and likely critical stenosis. 2. Moderate plaque at the level of the right carotid bulb and proximal right ICA. Estimated right ICA stenosis is 50-69%. Electronically Signed   By: Aletta Edouard M.D.   On: 10/02/2022 10:10   MR BRAIN WO CONTRAST  Result Date: 10/01/2022 CLINICAL DATA:  Delirium, syncope/presyncope EXAM: MRI HEAD WITHOUT CONTRAST TECHNIQUE: Multiplanar, multiecho pulse sequences of the brain and surrounding structures were obtained without intravenous contrast. COMPARISON:  12/29/2019 MRI head, correlation is also made with 10/01/2022 CT head FINDINGS: Brain: No restricted diffusion to suggest acute or subacute infarct. No acute hemorrhage, mass, mass effect, or midline shift. No hydrocephalus or extra-axial collection. No hemosiderin deposition to suggest remote hemorrhage. Vascular: Normal arterial flow voids. Skull and upper cervical spine: Normal marrow signal. Sinuses/Orbits: Clear paranasal sinuses. The orbits are unremarkable. Other: Trace fluid in the right mastoid tip. IMPRESSION: No acute intracranial process. No evidence of acute or subacute infarct. Electronically Signed   By: Merilyn Baba M.D.   On: 10/01/2022 23:11   CT CHEST ABDOMEN PELVIS W CONTRAST  Result Date: 10/01/2022 CLINICAL DATA:  Trauma. EXAM: CT CHEST, ABDOMEN, AND PELVIS WITH CONTRAST TECHNIQUE: Multidetector CT imaging of the chest, abdomen and pelvis was performed following the standard protocol during bolus administration of intravenous contrast. RADIATION DOSE REDUCTION: This exam was performed according to the departmental  dose-optimization program which includes automated exposure control, adjustment of the mA and/or kV according to patient size and/or use of iterative reconstruction technique. CONTRAST:  78m OMNIPAQUE IOHEXOL 300 MG/ML  SOLN COMPARISON:  Chest CT dated 06/27/2022. FINDINGS: CT CHEST FINDINGS Cardiovascular: There is no cardiomegaly or pericardial effusion there is coronary vascular calcification and calcification of the mitral annulus. Moderate atherosclerotic calcification of the thoracic aorta. No aneurysmal dilatation or dissection. The origins  of the great vessels of the aortic arch and the central pulmonary arteries appear patent. Mediastinum/Nodes: No hilar or mediastinal adenopathy. The esophagus is grossly unremarkable. No mediastinal fluid collection. Lungs/Pleura: Bibasilar linear atelectasis/scarring. There is a 3 mm left upper lobe calcified granuloma. No focal consolidation or pneumothorax. Trace right pleural effusion may be present. Musculoskeletal: Nondisplaced fracture of the posterior right eleventh rib at the costovertebral articulation as well as fracture of the posterior right eleventh rib. Displaced fracture of the posterior right tenth rib. CT ABDOMEN PELVIS FINDINGS No intra-abdominal free air or free fluid. Hepatobiliary: Mild fatty liver. No biliary dilatation. Cholecystectomy. No retained calcified stone noted in the central CBD. Pancreas: Unremarkable. No pancreatic ductal dilatation or surrounding inflammatory changes. Spleen: Normal in size without focal abnormality. Adrenals/Urinary Tract: The right adrenal gland is unremarkable. Indeterminate 2 cm left adrenal nodule as seen on the prior CT as adenoma. No follow-up. There is no hydronephrosis on either side. There is symmetric enhancement and excretion of contrast by both kidneys. The visualized ureters and urinary bladder appear unremarkable. Stomach/Bowel: There is severe sigmoid diverticulosis and scattered colonic diverticula  without active inflammatory changes. There is no bowel obstruction or active inflammation. The appendix is normal. Vascular/Lymphatic: Advanced aortoiliac atherosclerotic disease. The IVC is unremarkable. No portal venous gas. There is no adenopathy. Reproductive: Hysterectomy.  No adnexal masses. Other: None Musculoskeletal: Minimally displaced fractures of the right L1 and L2 transverse processes. There is degenerative changes of the spine and hips. No acute osseous pathology. Grade 1 L4-L5 anterolisthesis. IMPRESSION: 1. Fractures of the posterior right eleventh and tenth ribs. No pneumothorax. 2. Minimally displaced fractures of the right L1 and L2 transverse processes. 3. Colonic diverticulosis. No bowel obstruction. Normal appendix. 4.  Aortic Atherosclerosis (ICD10-I70.0). Electronically Signed   By: Anner Crete M.D.   On: 10/01/2022 19:29   CT Head Wo Contrast  Result Date: 10/01/2022 CLINICAL DATA:  Trauma, fall EXAM: CT HEAD WITHOUT CONTRAST TECHNIQUE: Contiguous axial images were obtained from the base of the skull through the vertex without intravenous contrast. RADIATION DOSE REDUCTION: This exam was performed according to the departmental dose-optimization program which includes automated exposure control, adjustment of the mA and/or kV according to patient size and/or use of iterative reconstruction technique. COMPARISON:  06/16/2017 FINDINGS: Brain: No acute intracranial findings are seen. There are no signs of bleeding within the cranium. Ventricles are not dilated. Cortical sulci are prominent. Vascular: Unremarkable. Skull: Unremarkable. Sinuses/Orbits: There is mucosal thickening in ethmoid and left maxillary sinuses. Other: None. IMPRESSION: No acute intracranial findings are seen in noncontrast CT brain. Atrophy. Chronic sinusitis. Electronically Signed   By: Elmer Picker M.D.   On: 10/01/2022 15:14   CT Cervical Spine Wo Contrast  Result Date: 10/01/2022 CLINICAL DATA:   Pain after a fall. EXAM: CT CERVICAL SPINE WITHOUT CONTRAST TECHNIQUE: Multidetector CT imaging of the cervical spine was performed without intravenous contrast. Multiplanar CT image reconstructions were also generated. RADIATION DOSE REDUCTION: This exam was performed according to the departmental dose-optimization program which includes automated exposure control, adjustment of the mA and/or kV according to patient size and/or use of iterative reconstruction technique. COMPARISON:  None Available. FINDINGS: Alignment: Trace retrolisthesis of C4 on 5 is compatible with the advanced loss of disc space at this level. No findings to suggest traumatic subluxation. Skull base and vertebrae: No acute fracture. No primary bone lesion or focal pathologic process. Soft tissues and spinal canal: No prevertebral fluid or swelling. No visible canal hematoma. Disc levels:  Marked loss of disc height C4-5 and C6-7 with more mild loss of disc height noted at C3-4 and C5-6. Upper chest: Negative. Other: None. IMPRESSION: 1. No evidence for cervical spine fracture or traumatic subluxation. 2. Degenerative changes in the cervical spine as above. Electronically Signed   By: Misty Stanley M.D.   On: 10/01/2022 15:13   DG Lumbar Spine Complete  Result Date: 10/01/2022 CLINICAL DATA:  Fall 2 weeks ago.  Pain. EXAM: LUMBAR SPINE - COMPLETE 4+ VIEW COMPARISON:  None Available. FINDINGS: Mild curvature of the lumbar spine, apex to the left. 5 mm retrolisthesis of L5 versus S1. Trace retrolisthesis of L2 versus L3. No other malalignment. No fractures. Multilevel degenerative disc disease most prominent at L2-3. Lower lumbar facet degenerative changes are noted. Calcified atherosclerotic changes are identified in the abdominal aorta. IMPRESSION: 1. Mild curvature of the lumbar spine, apex to the left. 5 mm retrolisthesis of L5 versus S1. Trace retrolisthesis of L2 versus L3. 2. Degenerative changes as above. 3. Calcified atherosclerotic  changes in the abdominal aorta. Electronically Signed   By: Dorise Bullion III M.D.   On: 10/01/2022 15:01               LOS: 0 days   Gurnoor Sloop  [facility]   Pager on [email or web address]. If 7PM-7AM, please contact night-coverage at [email or web address]     10/02/2022, 4:00 PM

## 2022-10-02 NOTE — Assessment & Plan Note (Signed)
Differentials include TIA CVA, neuropathy, hypotension, dehydration.,  Cardiac dysrhythmias. We will admit patient on telemetry monitored floor. We will obtain an MRI carotid Dopplers and a 2D echocardiogram. We will obtain a B12 level. Fall precautions aspiration precautions and seizure precautions.

## 2022-10-03 ENCOUNTER — Encounter: Payer: Self-pay | Admitting: Internal Medicine

## 2022-10-03 DIAGNOSIS — R55 Syncope and collapse: Secondary | ICD-10-CM | POA: Diagnosis not present

## 2022-10-03 DIAGNOSIS — W19XXXA Unspecified fall, initial encounter: Secondary | ICD-10-CM

## 2022-10-03 DIAGNOSIS — F1010 Alcohol abuse, uncomplicated: Secondary | ICD-10-CM | POA: Diagnosis not present

## 2022-10-03 DIAGNOSIS — S2241XA Multiple fractures of ribs, right side, initial encounter for closed fracture: Secondary | ICD-10-CM | POA: Diagnosis not present

## 2022-10-03 DIAGNOSIS — N179 Acute kidney failure, unspecified: Secondary | ICD-10-CM | POA: Diagnosis not present

## 2022-10-03 LAB — BASIC METABOLIC PANEL
Anion gap: 8 (ref 5–15)
BUN: 21 mg/dL (ref 8–23)
CO2: 28 mmol/L (ref 22–32)
Calcium: 9 mg/dL (ref 8.9–10.3)
Chloride: 107 mmol/L (ref 98–111)
Creatinine, Ser: 0.89 mg/dL (ref 0.44–1.00)
GFR, Estimated: >60 mL/min (ref >60)
Glucose, Bld: 118 mg/dL — ABNORMAL HIGH (ref 70–99)
Potassium: 3.8 mmol/L (ref 3.5–5.1)
Sodium: 143 mmol/L (ref 135–145)

## 2022-10-03 LAB — PHOSPHORUS: Phosphorus: 2.5 mg/dL (ref 2.5–4.6)

## 2022-10-03 LAB — MAGNESIUM: Magnesium: 1.9 mg/dL (ref 1.7–2.4)

## 2022-10-03 LAB — GLUCOSE, CAPILLARY: Glucose-Capillary: 105 mg/dL — ABNORMAL HIGH (ref 70–99)

## 2022-10-03 MED ORDER — IBUPROFEN 400 MG PO TABS: 800.0000 mg | ORAL_TABLET | Freq: Three times a day (TID) | ORAL | Status: AC | PRN

## 2022-10-03 MED ORDER — ORAL CARE MOUTH RINSE: 15.0000 mL | OROMUCOSAL | Status: DC | PRN

## 2022-10-03 NOTE — Discharge Summary (Signed)
Physician Discharge Summary   Patient: Barbara Contreras MRN: [id number] DOB: 10/29/1951  Admit date:     10/01/2022  Discharge date: 10/03/22  Discharge Physician: Jennye Boroughs   PCP: Valera Castle, MD   Recommendations at discharge:  {Tip this will not be part of the note when signed- Example include specific recommendations for outpatient follow-up, pending tests to follow-up on. (Optional):26781}  ***  Discharge Diagnoses: Principal Problem:   Syncope and collapse Active Problems:   Elevated troponin   HTN (hypertension)   Tobacco use disorder   Alcohol abuse   AKI (acute kidney injury) ([place])   Closed fracture of rib of right side   Closed L1 vertebral fracture ([facility])   Closed L2 vertebral fracture ([place])   Fall  Resolved Problems:   * No resolved hospital problems. [facility] Course:       Assessment and Plan: * Syncope and collapse Differentials include TIA CVA, neuropathy, hypotension, dehydration.,  Cardiac dysrhythmias. We will admit patient on telemetry monitored floor. We will obtain an MRI carotid Dopplers and a 2D echocardiogram. We will obtain a B12 level. Fall precautions aspiration precautions and seizure precautions.   Elevated troponin Troponin is mildly elevated I do not suspect it is a cardiac source. From the initial troponins of 54 is gone down to 41. Suspect may be related to the AKI that the patient has.   HTN (hypertension) Vitals:   10/01/22 1303 10/01/22 2014 10/01/22 2329 10/01/22 2336  BP: 115/63 130/67 (!) 154/77 (!) 154/77  Suspect orthostatic hypotension may have been a component to the patient's transient syncopal and collapse episode. Orthostatic vitals. We will continue patient on amlodipine 5 mg 2 times a day. We will discontinue lisinopril and HCTZ, expect patient will be well controlled on HCTZ 6.25 mg upon discharge along with 5 mg of amlodipine alone for systolic hypertension secondary to her smoking  history. We will defer to primary care team for further tailoring of blood pressure regimen upon discharge.   AKI (acute kidney injury) ([place]) Lab Results  Component Value Date   CREATININE 1.33 (H) 10/01/2022   CREATININE 0.88 08/03/2021   CREATININE 0.74 06/17/2017  Will follow, avoid contrast, renally dose all needed medications. We will hold patient's lisinopril and HCTZ.   Alcohol abuse CIWA precautions Aspiration/fall/seizure precautions Thiamine 100 mg daily. IV PPI. Counseling once patient is stable.   Tobacco use disorder Nicotine patch.       {Tip this will not be part of the note when signed Body mass index is 27.15 kg/m. , ,  (Optional):26781}  {(NOTE) Pain control PDMP Statment (Optional):26782} Consultants: *** Procedures performed: ***  Disposition: {Plan; Disposition:26390} Diet recommendation:  Discharge Diet Orders (From admission, onward)     Start     Ordered   10/03/22 0000  Diet - low sodium heart healthy        10/03/22 1624           {Diet_Plan:26776} DISCHARGE MEDICATION: Allergies as of 10/03/2022       Reactions   Morphine And Related Itching   Clindamycin Other (See Comments)   Erythromycin    Other reaction(s): Other (see comments)   Other    Other reaction(s): Other (See Comments) Redness and itching   Penicillins Other (See Comments)   TOLERATED CEFAZOLIN PRIOR Reaction: unknown Has patient had a PCN reaction causing immediate rash, facial/tongue/throat swelling, SOB or lightheadedness with hypotension: Unknown Has patient had a PCN reaction causing severe rash involving mucus membranes  or skin necrosis: Unknown Has patient had a PCN reaction that required hospitalization: Unknown Has patient had a PCN reaction occurring within the last 10 years: no If all of the above answers are "NO", then may proceed with Cephalosporin use.   Promethazine-phenylephrine Other (See Comments)   Muscle spasm   Tape Other (See  Comments)   Redness and itching        Medication List     STOP taking these medications    doxycycline 100 MG capsule Commonly known as: MONODOX   fluticasone 50 MCG/ACT nasal spray Commonly known as: FLONASE   montelukast 10 MG tablet Commonly known as: SINGULAIR       TAKE these medications    albuterol 108 (90 Base) MCG/ACT inhaler Commonly known as: VENTOLIN HFA Inhale 1-2 puffs into the lungs every 6 (six) hours as needed for wheezing or shortness of breath.   amLODipine 5 MG tablet Commonly known as: NORVASC Take 5 mg by mouth daily.   anastrozole 1 MG tablet Commonly known as: ARIMIDEX TAKE 1 TABLET BY MOUTH EVERY DAY   aspirin EC 81 MG tablet Take 81 mg by mouth daily.   atorvastatin 80 MG tablet Commonly known as: LIPITOR Take 80 mg by mouth daily. What changed: Another medication with the same name was removed. Continue taking this medication, and follow the directions you see here.   baclofen 10 MG tablet Commonly known as: LIORESAL Take 5-10 mg by mouth at bedtime as needed for muscle spasms.   calcium carbonate 750 MG chewable tablet Commonly known as: TUMS EX Chew 1 tablet by mouth daily as needed for heartburn.   clobetasol 0.05 % external solution Commonly known as: TEMOVATE Apply 1 Application topically daily.   cyanocobalamin 1000 MCG tablet Commonly known as: VITAMIN B12 Take 1,000 mcg by mouth daily.   dicyclomine 10 MG capsule Commonly known as: BENTYL Take 10 mg by mouth in the morning and at bedtime.   docusate sodium 100 MG capsule Commonly known as: COLACE Take 100 mg by mouth 2 (two) times daily.   famotidine 20 MG tablet Commonly known as: PEPCID Take 20 mg by mouth 2 (two) times daily.   gabapentin 600 MG tablet Commonly known as: NEURONTIN Take 600 mg by mouth 3 (three) times daily.   ibuprofen 400 MG tablet Commonly known as: ADVIL Take 2 tablets (800 mg total) by mouth every 8 (eight) hours as needed for up  to 3 days for moderate pain. What changed:  medication strength reasons to take this   ketoconazole 2 % shampoo Commonly known as: NIZORAL Apply 1 application topically once a week.   lisinopril-hydrochlorothiazide 20-12.5 MG tablet Commonly known as: ZESTORETIC Take 1 tablet by mouth in the morning and at bedtime.   multivitamin with minerals Tabs tablet Take 1 tablet by mouth daily.   tiotropium 18 MCG inhalation capsule Commonly known as: SPIRIVA Place 18 mcg into inhaler and inhale every morning.   venlafaxine XR 75 MG 24 hr capsule Commonly known as: EFFEXOR-XR Take 75 mg by mouth 3 (three) times daily.   Vitamin D3 10 MCG (400 UNIT) tablet Take 400 Units by mouth daily.   ZINC 15 PO Take 15 mg by mouth daily.               Durable Medical Equipment  (From admission, onward)           Start     Ordered   10/03/22 0000  For home use only  DME 3 n 1        10/03/22 1624   10/03/22 0000  For home use only DME Walker       Question Answer Comment  Patient needs a walker to treat with the following condition General weakness   Patient needs a walker to treat with the following condition Fall      10/03/22 1624            Discharge Exam: Filed Weights   10/01/22 2329  Weight: 74 kg   ***  Condition at discharge: {DC Condition:26389}  The results of significant diagnostics from this hospitalization (including imaging, microbiology, ancillary and laboratory) are listed below for reference.   Imaging Studies: CT ANGIO NECK W OR WO CONTRAST  Result Date: 10/02/2022 CLINICAL DATA:  Carotid stenosis.  Syncope, collapse. EXAM: CT ANGIOGRAPHY NECK TECHNIQUE: Multidetector CT imaging of the neck was performed using the standard protocol during bolus administration of intravenous contrast. Multiplanar CT image reconstructions and MIPs were obtained to evaluate the vascular anatomy. Carotid stenosis measurements (when applicable) are obtained utilizing  NASCET criteria, using the distal internal carotid diameter as the denominator. RADIATION DOSE REDUCTION: This exam was performed according to the departmental dose-optimization program which includes automated exposure control, adjustment of the mA and/or kV according to patient size and/or use of iterative reconstruction technique. CONTRAST:  43m OMNIPAQUE IOHEXOL 350 MG/ML SOLN COMPARISON:  Same day carotid Doppler. FINDINGS: Aortic arch: There is calcified and noncalcified plaque in the imaged aortic arch. The origins of the major branch vessels are patent. The subclavian arteries are patent to the level imaged. Right carotid system: The patient is status post right carotid endarterectomy. There is scattered mixed plaque in the right common carotid artery without hemodynamically significant stenosis or occlusion. There is noncalcified plaque in the proximal internal carotid artery resulting in up to approximately 40% stenosis. The external carotid artery is patent. There is no dissection or aneurysm. Left carotid system: The left common carotid artery is patent. There is bulky mixed plaque in the proximal internal carotid artery resulting in up to approximately 75% stenosis, though note that estimation of the percentage is difficult due to blooming artifact from the adjacent calcium. The distal internal carotid artery is patent. The external carotid artery is patent. There is no evidence of dissection or aneurysm. Vertebral arteries: There is calcified plaque at the origin of both vertebral arteries resulting in up to mild stenosis on the left. There is mixed plaque in the mid left V2 segment resulting in moderate stenosis (8-100). The distal left internal carotid artery is patent. There is moderate stenosis of the right V1/V2 junction (8-140). There is scattered calcified plaque in the remainder of the right vertebral artery without greater than mild stenosis. There is no evidence of dissection or aneurysm.  There is calcified plaque in the carotid siphons without definite hemodynamically significant stenosis or occlusion to the level imaged Skeleton: There is multilevel disc space narrowing and degenerative endplate change of the cervical spine. There is no acute osseous abnormality or suspicious osseous lesion. There is no visible canal hematoma. Other neck: The soft tissues of the neck are unremarkable. Upper chest: The imaged lung apices are clear. IMPRESSION: 1. Bulky mixed plaque in the proximal left internal carotid artery resulting in high grade stenosis, up to approximately 75%, though note that estimation of the percentage is difficult due to blooming artifact from the adjacent calcium and this may be an underestimate. 2. Status post right carotid endarterectomy  with noncalcified plaque in the proximal right internal carotid artery resulting in up to approximately 40% stenosis. 3. Status post right carotid endarterectomy without hemodynamically significant stenosis or occlusion. 4. Moderate stenosis of the the right V1/V2 junction and left V2 segment. Electronically Signed   By: Valetta Mole M.D.   On: 10/02/2022 16:41   US Carotid Bilateral  Result Date: 10/02/2022 CLINICAL DATA:  Syncope and collapse. History prior right carotid endarterectomy and carotid atherosclerosis. Hypertension, hyperlipidemia and smoking history. EXAM: BILATERAL CAROTID DUPLEX ULTRASOUND TECHNIQUE: Pearline Cables scale imaging, color Doppler and duplex ultrasound were performed of bilateral carotid and vertebral arteries in the neck. COMPARISON:  Prior carotid duplex ultrasound at [facility] on 07/25/2022 FINDINGS: Criteria: Quantification of carotid stenosis is based on velocity parameters that correlate the residual internal carotid diameter with NASCET-based stenosis levels, using the diameter of the distal internal carotid lumen as the denominator for stenosis measurement. The following velocity measurements were obtained: RIGHT  ICA:  181/28 cm/sec CCA:  75/10 cm/sec SYSTOLIC ICA/CCA RATIO:  2.0 ECA:  123 cm/sec LEFT ICA:  422/117 cm/sec CCA:  25/85 cm/sec SYSTOLIC ICA/CCA RATIO:  5.1 ECA:  164 cm/sec RIGHT CAROTID ARTERY: Moderate, predominately calcified plaque again noted at the level of the distal bulb and proximal right ICA. Estimated right ICA stenosis is 50-69%. RIGHT VERTEBRAL ARTERY: Antegrade flow with normal waveform and velocity. LEFT CAROTID ARTERY: Large amount of predominately calcified plaque is again noted at the level of the distal bulb and proximal left ICA. Markedly elevated velocities are consistent with a greater than 70% stenosis and likely critical stenosis. LEFT VERTEBRAL ARTERY: Antegrade flow with normal waveform and velocity. IMPRESSION: 1. Large amount of plaque at the level of the left carotid bulb and proximal left ICA. Markedly elevated velocities are consistent with a greater than 70% stenosis and likely critical stenosis. 2. Moderate plaque at the level of the right carotid bulb and proximal right ICA. Estimated right ICA stenosis is 50-69%. Electronically Signed   By: Aletta Edouard M.D.   On: 10/02/2022 10:10   MR BRAIN WO CONTRAST  Result Date: 10/01/2022 CLINICAL DATA:  Delirium, syncope/presyncope EXAM: MRI HEAD WITHOUT CONTRAST TECHNIQUE: Multiplanar, multiecho pulse sequences of the brain and surrounding structures were obtained without intravenous contrast. COMPARISON:  12/29/2019 MRI head, correlation is also made with 10/01/2022 CT head FINDINGS: Brain: No restricted diffusion to suggest acute or subacute infarct. No acute hemorrhage, mass, mass effect, or midline shift. No hydrocephalus or extra-axial collection. No hemosiderin deposition to suggest remote hemorrhage. Vascular: Normal arterial flow voids. Skull and upper cervical spine: Normal marrow signal. Sinuses/Orbits: Clear paranasal sinuses. The orbits are unremarkable. Other: Trace fluid in the right mastoid tip. IMPRESSION: No  acute intracranial process. No evidence of acute or subacute infarct. Electronically Signed   By: Merilyn Baba M.D.   On: 10/01/2022 23:11   CT CHEST ABDOMEN PELVIS W CONTRAST  Result Date: 10/01/2022 CLINICAL DATA:  Trauma. EXAM: CT CHEST, ABDOMEN, AND PELVIS WITH CONTRAST TECHNIQUE: Multidetector CT imaging of the chest, abdomen and pelvis was performed following the standard protocol during bolus administration of intravenous contrast. RADIATION DOSE REDUCTION: This exam was performed according to the departmental dose-optimization program which includes automated exposure control, adjustment of the mA and/or kV according to patient size and/or use of iterative reconstruction technique. CONTRAST:  47m OMNIPAQUE IOHEXOL 300 MG/ML  SOLN COMPARISON:  Chest CT dated 06/27/2022. FINDINGS: CT CHEST FINDINGS Cardiovascular: There is no cardiomegaly or pericardial effusion there is coronary vascular  calcification and calcification of the mitral annulus. Moderate atherosclerotic calcification of the thoracic aorta. No aneurysmal dilatation or dissection. The origins of the great vessels of the aortic arch and the central pulmonary arteries appear patent. Mediastinum/Nodes: No hilar or mediastinal adenopathy. The esophagus is grossly unremarkable. No mediastinal fluid collection. Lungs/Pleura: Bibasilar linear atelectasis/scarring. There is a 3 mm left upper lobe calcified granuloma. No focal consolidation or pneumothorax. Trace right pleural effusion may be present. Musculoskeletal: Nondisplaced fracture of the posterior right eleventh rib at the costovertebral articulation as well as fracture of the posterior right eleventh rib. Displaced fracture of the posterior right tenth rib. CT ABDOMEN PELVIS FINDINGS No intra-abdominal free air or free fluid. Hepatobiliary: Mild fatty liver. No biliary dilatation. Cholecystectomy. No retained calcified stone noted in the central CBD. Pancreas: Unremarkable. No pancreatic  ductal dilatation or surrounding inflammatory changes. Spleen: Normal in size without focal abnormality. Adrenals/Urinary Tract: The right adrenal gland is unremarkable. Indeterminate 2 cm left adrenal nodule as seen on the prior CT as adenoma. No follow-up. There is no hydronephrosis on either side. There is symmetric enhancement and excretion of contrast by both kidneys. The visualized ureters and urinary bladder appear unremarkable. Stomach/Bowel: There is severe sigmoid diverticulosis and scattered colonic diverticula without active inflammatory changes. There is no bowel obstruction or active inflammation. The appendix is normal. Vascular/Lymphatic: Advanced aortoiliac atherosclerotic disease. The IVC is unremarkable. No portal venous gas. There is no adenopathy. Reproductive: Hysterectomy.  No adnexal masses. Other: None Musculoskeletal: Minimally displaced fractures of the right L1 and L2 transverse processes. There is degenerative changes of the spine and hips. No acute osseous pathology. Grade 1 L4-L5 anterolisthesis. IMPRESSION: 1. Fractures of the posterior right eleventh and tenth ribs. No pneumothorax. 2. Minimally displaced fractures of the right L1 and L2 transverse processes. 3. Colonic diverticulosis. No bowel obstruction. Normal appendix. 4.  Aortic Atherosclerosis (ICD10-I70.0). Electronically Signed   By: Anner Crete M.D.   On: 10/01/2022 19:29   CT Head Wo Contrast  Result Date: 10/01/2022 CLINICAL DATA:  Trauma, fall EXAM: CT HEAD WITHOUT CONTRAST TECHNIQUE: Contiguous axial images were obtained from the base of the skull through the vertex without intravenous contrast. RADIATION DOSE REDUCTION: This exam was performed according to the departmental dose-optimization program which includes automated exposure control, adjustment of the mA and/or kV according to patient size and/or use of iterative reconstruction technique. COMPARISON:  06/16/2017 FINDINGS: Brain: No acute intracranial  findings are seen. There are no signs of bleeding within the cranium. Ventricles are not dilated. Cortical sulci are prominent. Vascular: Unremarkable. Skull: Unremarkable. Sinuses/Orbits: There is mucosal thickening in ethmoid and left maxillary sinuses. Other: None. IMPRESSION: No acute intracranial findings are seen in noncontrast CT brain. Atrophy. Chronic sinusitis. Electronically Signed   By: Elmer Picker M.D.   On: 10/01/2022 15:14   CT Cervical Spine Wo Contrast  Result Date: 10/01/2022 CLINICAL DATA:  Pain after a fall. EXAM: CT CERVICAL SPINE WITHOUT CONTRAST TECHNIQUE: Multidetector CT imaging of the cervical spine was performed without intravenous contrast. Multiplanar CT image reconstructions were also generated. RADIATION DOSE REDUCTION: This exam was performed according to the departmental dose-optimization program which includes automated exposure control, adjustment of the mA and/or kV according to patient size and/or use of iterative reconstruction technique. COMPARISON:  None Available. FINDINGS: Alignment: Trace retrolisthesis of C4 on 5 is compatible with the advanced loss of disc space at this level. No findings to suggest traumatic subluxation. Skull base and vertebrae: No acute fracture. No primary bone  lesion or focal pathologic process. Soft tissues and spinal canal: No prevertebral fluid or swelling. No visible canal hematoma. Disc levels: Marked loss of disc height C4-5 and C6-7 with more mild loss of disc height noted at C3-4 and C5-6. Upper chest: Negative. Other: None. IMPRESSION: 1. No evidence for cervical spine fracture or traumatic subluxation. 2. Degenerative changes in the cervical spine as above. Electronically Signed   By: Misty Stanley M.D.   On: 10/01/2022 15:13   DG Lumbar Spine Complete  Result Date: 10/01/2022 CLINICAL DATA:  Fall 2 weeks ago.  Pain. EXAM: LUMBAR SPINE - COMPLETE 4+ VIEW COMPARISON:  None Available. FINDINGS: Mild curvature of the lumbar  spine, apex to the left. 5 mm retrolisthesis of L5 versus S1. Trace retrolisthesis of L2 versus L3. No other malalignment. No fractures. Multilevel degenerative disc disease most prominent at L2-3. Lower lumbar facet degenerative changes are noted. Calcified atherosclerotic changes are identified in the abdominal aorta. IMPRESSION: 1. Mild curvature of the lumbar spine, apex to the left. 5 mm retrolisthesis of L5 versus S1. Trace retrolisthesis of L2 versus L3. 2. Degenerative changes as above. 3. Calcified atherosclerotic changes in the abdominal aorta. Electronically Signed   By: Dorise Bullion III M.D.   On: 10/01/2022 15:01    Microbiology: Results for orders placed or performed during the hospital encounter of 08/24/21  Aerobic/Anaerobic Culture w Gram Stain (surgical/deep wound)     Status: None   Collection Time: 08/24/21  9:52 AM   Specimen: Other Source; Body Fluid  Result Value Ref Range Status   Specimen Description   Final    BREAST RIGHT SEROMA Performed at [facility], [address] [place], [place] [address]    Special Requests   Final    NONE Performed at [facility], [place] [place], [place] [address]    Gram Stain   Final    RARE WBC PRESENT, PREDOMINANTLY PMN NO ORGANISMS SEEN    Culture   Final    No growth aerobically or anaerobically. Performed at [facility], [place] [address] [place], [place] [address]    Report Status 08/29/2021 FINAL  Final    Labs: CBC: Recent Labs  Lab 10/01/22 1723 10/02/22 0505  WBC 8.9 6.6  NEUTROABS 6.5  --   HGB 14.7 14.4  HCT 44.6 43.7  MCV 92.5 92.2  PLT 173 242   Basic Metabolic Panel: Recent Labs  Lab 10/01/22 1723 10/01/22 1725 10/02/22 0630 10/03/22 0656  NA 141  --  140 143  K 3.8  --  3.8 3.8  CL 101  --  104 107  CO2 28  --  26 28  GLUCOSE 96  --  96 118*  BUN 42*  --  35* 21  CREATININE 1.33*  --  1.03* 0.89  CALCIUM 9.4  --  8.7* 9.0  MG  --  2.3 2.2 1.9  PHOS  --    --  3.4 2.5   Liver Function Tests: Recent Labs  Lab 10/01/22 1723 10/02/22 0630  AST 54* 39  ALT 33 25  ALKPHOS 67 59  BILITOT 1.4* 1.4*  PROT 7.5 6.5  ALBUMIN 4.1 3.5   CBG: Recent Labs  Lab 10/03/22 0739  GLUCAP 105*    Discharge time spent: {LESS THAN/GREATER THAN:26388} 30 minutes.  Signed: Jennye Boroughs, MD [facility] 10/03/2022

## 2022-10-03 NOTE — Evaluation (Signed)
Physical Therapy Evaluation Patient Details Name: Barbara Contreras MRN: [id number] DOB: 1951/04/06 Today's Date: 10/03/2022  History of Present Illness  71 y.o. female with multiple medical problems including coronary artery disease, carotid artery stenosis, alcohol use disorder, chronic low back pain, prediabetes, vitamin D deficiency, Lyme disease, depression IBS, seizures, unsteady gait/ambulatory dysfunction at baseline because of chronic back pain.  She presented to the hospital because of multiple falls and confusion.   Clinical Impression  Patient sleeping, and initially hard to keep awake but with time and interaction able to follow all commands. Pt reported at baseline she lives alone, but said her sister can come stay with her at discharge as needed. Utilized rollator intermittently.   The patient was able to perform bed mobility without physical assistance with encouragement, use of bed rails. Noted to be at 84% on room air while supine, monitored throughout session and RN notified, but did improve to 89-92% with upright mobility. She ambulated ~12f with RW and CGA, cued for transfer technique and safe use of RW with ambulation.  Overall the patient demonstrated deficits (see "PT Problem List") that impede the patient's functional abilities, safety, and mobility and would benefit from skilled PT intervention. Recommendation at this time is HHPT with frequent/constant supervision/assistance to maximize safety and mobility.          Recommendations for follow up therapy are one component of a multi-disciplinary discharge planning process, led by the attending physician.  Recommendations may be updated based on patient status, additional functional criteria and insurance authorization.  Follow Up Recommendations Home health PT      Assistance Recommended at Discharge Frequent or constant Supervision/Assistance  Patient can return home with the following  A little help with  bathing/dressing/bathroom;A little help with walking and/or transfers;Assistance with cooking/housework;Assist for transportation;Help with stairs or ramp for entrance;Direct supervision/assist for medications management    Equipment Recommendations Rolling walker (2 wheels)  Recommendations for Other Services       Functional Status Assessment Patient has had a recent decline in their functional status and demonstrates the ability to make significant improvements in function in a reasonable and predictable amount of time.     Precautions / Restrictions Precautions Precautions: Fall Restrictions Weight Bearing Restrictions: No      Mobility  Bed Mobility Overal bed mobility: Needs Assistance Bed Mobility: Rolling, Sidelying to Sit, Sit to Supine Rolling: Min guard Sidelying to sit: Min guard   Sit to supine: Min guard   General bed mobility comments: extra time, reliance on bed rails    Transfers Overall transfer level: Needs assistance Equipment used: Rolling walker (2 wheels) Transfers: Sit to/from Stand Sit to Stand: Min guard           General transfer comment: VC for RW mgt    Ambulation/Gait Ambulation/Gait assistance: Min guard Gait Distance (Feet): 40 Feet Assistive device: Rolling walker (2 wheels)         General Gait Details: slow, cautious. no LOB noted  Stairs            Wheelchair Mobility    Modified Rankin (Stroke Patients Only)       Balance Overall balance assessment: Needs assistance Sitting-balance support: Feet supported Sitting balance-Leahy Scale: Fair     Standing balance support: Reliant on assistive device for balance, Bilateral upper extremity supported Standing balance-Leahy Scale: Fair  Pertinent Vitals/Pain Pain Assessment Pain Assessment: 0-10 Pain Score: 10-Worst pain ever Pain Location: low back, worse with spasms Pain Descriptors / Indicators: Spasm,  Aching Pain Intervention(s): Limited activity within patient's tolerance, Monitored during session, Repositioned    Home Living Family/patient expects to be discharged to:: Private residence Living Arrangements: Alone Available Help at Discharge: Family;Available 24 hours/day (sister is planning to stay with pt for as long as needed) Type of Home: House Home Access: Stairs to enter Entrance Stairs-Rails: Right;Left;Can reach both Entrance Stairs-Number of Steps: 5   Home Layout: One level Home Equipment: Shower seat;Cane - single point;Grab bars - tub/shower Additional Comments: pt reports sister has a rollator she can bring    Prior Function Prior Level of Function : Independent/Modified Independent;Driving;History of Falls (last six months)             Mobility Comments: no AD at baseline, reports 4-5 falls in past 19modue to LOB ADLs Comments: indep     Hand Dominance        Extremity/Trunk Assessment   Upper Extremity Assessment Upper Extremity Assessment: Defer to OT evaluation    Lower Extremity Assessment Lower Extremity Assessment: Generalized weakness       Communication   Communication: No difficulties  Cognition Arousal/Alertness: Awake/alert Behavior During Therapy: WFL for tasks assessed/performed Overall Cognitive Status: No family/caregiver present to determine baseline cognitive functioning                                          General Comments      Exercises     Assessment/Plan    PT Assessment Patient needs continued PT services  PT Problem List Decreased strength;Decreased mobility;Decreased activity tolerance;Decreased balance;Pain;Decreased knowledge of use of DME;Decreased knowledge of precautions       PT Treatment Interventions Therapeutic exercise;Balance training;Stair training;Gait training;DME instruction;Neuromuscular re-education;Functional mobility training;Therapeutic activities;Patient/family education     PT Goals (Current goals can be found in the Care Plan section)  Acute Rehab PT Goals Patient Stated Goal: to feel better PT Goal Formulation: With patient Time For Goal Achievement: 10/17/22 Potential to Achieve Goals: Good    Frequency Min 2X/week     Co-evaluation               AM-PAC PT "6 Clicks" Mobility  Outcome Measure Help needed turning from your back to your side while in a flat bed without using bedrails?: A Little Help needed moving from lying on your back to sitting on the side of a flat bed without using bedrails?: A Little Help needed moving to and from a bed to a chair (including a wheelchair)?: A Little Help needed standing up from a chair using your arms (e.g., wheelchair or bedside chair)?: A Little Help needed to walk in hospital room?: A Little Help needed climbing 3-5 steps with a railing? : A Little 6 Click Score: 18    End of Session Equipment Utilized During Treatment: Gait belt Activity Tolerance: Patient tolerated treatment well Patient left: in bed;with call bell/phone within reach;with bed alarm set Nurse Communication: Mobility status PT Visit Diagnosis: Difficulty in walking, not elsewhere classified (R26.2);Other abnormalities of gait and mobility (R26.89);Muscle weakness (generalized) (M62.81)    Time: 09735-3299PT Time Calculation (min) (ACUTE ONLY): 30 min   Charges:   PT Evaluation $PT Eval Low Complexity: 1 Low PT Treatments $Therapeutic Activity: 23-37 mins  Lieutenant Diego PT, DPT 1:31 PM,10/03/22

## 2022-10-03 NOTE — Evaluation (Signed)
Occupational Therapy Evaluation Patient Details Name: Barbara Contreras MRN: [id number] DOB: [birth year] Today's Date: 10/03/2022   History of Present Illness 71 y.o. female with multiple medical problems including coronary artery disease, carotid artery stenosis, alcohol use disorder, chronic low back pain, prediabetes, vitamin D deficiency, Lyme disease, depression IBS, seizures, unsteady gait/ambulatory dysfunction at baseline because of chronic back pain.  She presented to the hospital because of multiple falls and confusion.   Clinical Impression   Pt was seen for OT evaluation this date. Prior to hospital admission, pt was independent with mobility and ADL, living by herself in a 1 story home with 5 STE and bilat rails. Pt endorses 4-5 falls in past 94modue to a LOB. She states her sister is planning to stay with her "as long as I need it" after discharge. Pt presents to acute OT demonstrating impaired ADL performance and functional mobility 2/2 impairments in strength, balance, low back pain, and safety (See OT problem list). Pt currently requires MIN A for bed mobility, CGA for ADL transfers and short distance mobility with VC for RW mgt, and PRN MIN A for LB ADL tasks. Pt declined to ambulate further due to pain and wanting to keep eating breakfast. Pt educated in RW, ADL transfers, AE for LB ADL Tasks to minimize LBP, falls prevention. Pt verbalized understanding. Pt would benefit from skilled OT services to address noted impairments and functional limitations (see below for any additional details) in order to maximize safety and independence while minimizing falls risk and caregiver burden. Upon hospital discharge, recommend HHOT to maximize pt safety and return to functional independence during meaningful occupations of daily life.      Recommendations for follow up therapy are one component of a multi-disciplinary discharge planning process, led by the attending physician.  Recommendations  may be updated based on patient status, additional functional criteria and insurance authorization.   Follow Up Recommendations  Home health OT    Assistance Recommended at Discharge Intermittent Supervision/Assistance  Patient can return home with the following A little help with walking and/or transfers;A little help with bathing/dressing/bathroom;Assistance with cooking/housework;Assist for transportation;Help with stairs or ramp for entrance    Functional Status Assessment  Patient has had a recent decline in their functional status and demonstrates the ability to make significant improvements in function in a reasonable and predictable amount of time.  Equipment Recommendations  BSC/3in1;Other (comment) (2WW)    Recommendations for Other Services       Precautions / Restrictions Precautions Precautions: Fall Restrictions Weight Bearing Restrictions: No      Mobility Bed Mobility Overal bed mobility: Needs Assistance Bed Mobility: Rolling, Sidelying to Sit Rolling: Min assist Sidelying to sit: Min assist, HOB elevated       General bed mobility comments: VC for sequencing    Transfers Overall transfer level: Needs assistance Equipment used: Rolling walker (2 wheels) Transfers: Sit to/from Stand Sit to Stand: Min guard           General transfer comment: VC for RW mgt      Balance Overall balance assessment: Needs assistance Sitting-balance support: No upper extremity supported, Single extremity supported, Bilateral upper extremity supported Sitting balance-Leahy Scale: Fair     Standing balance support: Reliant on assistive device for balance, Bilateral upper extremity supported Standing balance-Leahy Scale: Poor Standing balance comment: reliant on RW, trembly  ADL either performed or assessed with clinical judgement   ADL                                         General ADL Comments: Pt requires  CGA for ADL transfers, PRN MIN A for LB ADL Tasks 2/2 LBP     Vision         Perception     Praxis      Pertinent Vitals/Pain Pain Assessment Pain Assessment: 0-10 Pain Score: 5  Pain Location: low back, worse with spasms Pain Descriptors / Indicators: Spasm, Aching Pain Intervention(s): Limited activity within patient's tolerance, Monitored during session, Premedicated before session, Repositioned     Hand Dominance     Extremity/Trunk Assessment Upper Extremity Assessment Upper Extremity Assessment: Generalized weakness   Lower Extremity Assessment Lower Extremity Assessment: Defer to PT evaluation       Communication Communication Communication: No difficulties   Cognition Arousal/Alertness: Awake/alert Behavior During Therapy: WFL for tasks assessed/performed Overall Cognitive Status: No family/caregiver present to determine baseline cognitive functioning                                 General Comments: slow processing, VC for simple commands     General Comments  pt trembly throughout session, pt reports this is baseline    Exercises Other Exercises Other Exercises: Pt educated in RW, ADL transfers, AE for LB ADL Tasks to minimize LBP, falls prevention   Shoulder Instructions      Home Living Family/patient expects to be discharged to:: Private residence Living Arrangements: Alone Available Help at Discharge: Family;Available 24 hours/day (sister is planning to stay with pt for as long as needed) Type of Home: House Home Access: Stairs to enter [employer] of Steps: 5 Entrance Stairs-Rails: Right;Left;Can reach both Home Layout: One level     Bathroom Shower/Tub: Teacher, early years/pre: Standard     Home Equipment: Marine scientist - single point;Grab bars - tub/shower   Additional Comments: pt reports sister has a rollator she can bring      Prior Functioning/Environment Prior Level of Function :  Independent/Modified Independent;Driving;History of Falls (last six months)             Mobility Comments: no AD at baseline, reports 4-5 falls in past 65modue to LOB ADLs Comments: indep        OT Problem List: Decreased strength;Pain;Decreased safety awareness;Impaired balance (sitting and/or standing);Decreased knowledge of use of DME or AE      OT Treatment/Interventions: Self-care/ADL training;Therapeutic exercise;Therapeutic activities;DME and/or AE instruction;Patient/family education;Balance training    OT Goals(Current goals can be found in the care plan section) Acute Rehab OT Goals Patient Stated Goal: get better OT Goal Formulation: With patient Time For Goal Achievement: 10/04/22 Potential to Achieve Goals: Good ADL Goals Pt Will Perform Lower Body Dressing: with modified independence;with adaptive equipment;sit to/from stand Pt Will Transfer to Toilet: with modified independence;ambulating;regular height toilet (LRAD) Pt Will Perform Toileting - Clothing Manipulation and hygiene: with modified independence Additional ADL Goal #1: Pt will verbalize plan to implement at least 2 learned falls prevention strategies to maximize safety/indep with ADL/IADL/mobility.  OT Frequency: Min 2X/week    Co-evaluation              AM-PAC OT "6 Clicks" Daily Activity  Outcome Measure Help from another person eating meals?: None Help from another person taking care of personal grooming?: None Help from another person toileting, which includes using toliet, bedpan, or urinal?: A Little Help from another person bathing (including washing, rinsing, drying)?: A Little Help from another person to put on and taking off regular upper body clothing?: None Help from another person to put on and taking off regular lower body clothing?: A Little 6 Click Score: 21   End of Session Equipment Utilized During Treatment: Gait belt;Rolling walker (2 wheels)  Activity Tolerance: Patient  tolerated treatment well Patient left: in chair;with call bell/phone within reach;with chair alarm set  OT Visit Diagnosis: Other abnormalities of gait and mobility (R26.89);Repeated falls (R29.6);Muscle weakness (generalized) (M62.81);Pain Pain - Right/Left:  (low back)                Time: 5885-0277 OT Time Calculation (min): 23 min Charges:  OT General Charges $OT Visit: 1 Visit OT Evaluation $OT Eval Low Complexity: 1 Low OT Treatments $Self Care/Home Management : 8-22 mins  Ardeth Perfect., MPH, MS, OTR/L ascom [phone] 10/03/22, 9:25 AM

## 2022-10-04 LAB — ECHOCARDIOGRAM COMPLETE BUBBLE STUDY
AR max vel: 1.71 cm2
AV Area VTI: 1.68 cm2
AV Area mean vel: 1.65 cm2
AV Mean grad: 7 mmHg
AV Peak grad: 13.8 mmHg
Ao pk vel: 1.86 m/s
Area-P 1/2: 2.71 cm2
S' Lateral: 2.3 cm

## 2022-10-16 ENCOUNTER — Other Ambulatory Visit: Payer: Medicare Other

## 2022-10-16 ENCOUNTER — Ambulatory Visit: Payer: Medicare Other | Admitting: Internal Medicine

## 2022-10-24 ENCOUNTER — Encounter (INDEPENDENT_AMBULATORY_CARE_PROVIDER_SITE_OTHER): Payer: Medicare Other | Admitting: Vascular Surgery

## 2022-10-30 ENCOUNTER — Other Ambulatory Visit: Payer: Self-pay | Admitting: Internal Medicine

## 2022-11-06 ENCOUNTER — Inpatient Hospital Stay: Payer: Medicare Other | Attending: Internal Medicine

## 2022-11-06 ENCOUNTER — Encounter: Payer: Self-pay | Admitting: Internal Medicine

## 2022-11-06 ENCOUNTER — Other Ambulatory Visit: Payer: Self-pay

## 2022-11-06 ENCOUNTER — Emergency Department: Payer: Medicare Other

## 2022-11-06 ENCOUNTER — Inpatient Hospital Stay (HOSPITAL_BASED_OUTPATIENT_CLINIC_OR_DEPARTMENT_OTHER): Payer: Medicare Other | Admitting: Internal Medicine

## 2022-11-06 ENCOUNTER — Observation Stay
Admission: EM | Admit: 2022-11-06 | Discharge: 2022-11-07 | Disposition: A | Discharge status: Home / Self Care | Payer: Medicare Other | Attending: Internal Medicine | Admitting: Internal Medicine

## 2022-11-06 DIAGNOSIS — I10 Essential (primary) hypertension: Secondary | ICD-10-CM

## 2022-11-06 DIAGNOSIS — Z79811 Long term (current) use of aromatase inhibitors: Secondary | ICD-10-CM | POA: Diagnosis not present

## 2022-11-06 DIAGNOSIS — R42 Dizziness and giddiness: Secondary | ICD-10-CM | POA: Insufficient documentation

## 2022-11-06 DIAGNOSIS — C50411 Malignant neoplasm of upper-outer quadrant of right female breast: Secondary | ICD-10-CM | POA: Diagnosis present

## 2022-11-06 DIAGNOSIS — K219 Gastro-esophageal reflux disease without esophagitis: Secondary | ICD-10-CM | POA: Diagnosis not present

## 2022-11-06 DIAGNOSIS — R112 Nausea with vomiting, unspecified: Secondary | ICD-10-CM | POA: Insufficient documentation

## 2022-11-06 DIAGNOSIS — F17210 Nicotine dependence, cigarettes, uncomplicated: Secondary | ICD-10-CM | POA: Insufficient documentation

## 2022-11-06 DIAGNOSIS — E785 Hyperlipidemia, unspecified: Secondary | ICD-10-CM | POA: Diagnosis not present

## 2022-11-06 DIAGNOSIS — J449 Chronic obstructive pulmonary disease, unspecified: Secondary | ICD-10-CM | POA: Diagnosis not present

## 2022-11-06 DIAGNOSIS — K529 Noninfective gastroenteritis and colitis, unspecified: Secondary | ICD-10-CM

## 2022-11-06 DIAGNOSIS — R197 Diarrhea, unspecified: Secondary | ICD-10-CM | POA: Diagnosis not present

## 2022-11-06 DIAGNOSIS — D849 Immunodeficiency, unspecified: Secondary | ICD-10-CM | POA: Diagnosis not present

## 2022-11-06 DIAGNOSIS — Z7982 Long term (current) use of aspirin: Secondary | ICD-10-CM | POA: Diagnosis not present

## 2022-11-06 DIAGNOSIS — J45909 Unspecified asthma, uncomplicated: Secondary | ICD-10-CM | POA: Diagnosis not present

## 2022-11-06 DIAGNOSIS — I959 Hypotension, unspecified: Secondary | ICD-10-CM | POA: Diagnosis not present

## 2022-11-06 DIAGNOSIS — N179 Acute kidney failure, unspecified: Secondary | ICD-10-CM | POA: Diagnosis not present

## 2022-11-06 DIAGNOSIS — E860 Dehydration: Secondary | ICD-10-CM | POA: Insufficient documentation

## 2022-11-06 DIAGNOSIS — Z170 Estrogen receptor positive status [ER+]: Secondary | ICD-10-CM

## 2022-11-06 DIAGNOSIS — Z1152 Encounter for screening for COVID-19: Secondary | ICD-10-CM | POA: Diagnosis not present

## 2022-11-06 DIAGNOSIS — Z79899 Other long term (current) drug therapy: Secondary | ICD-10-CM | POA: Insufficient documentation

## 2022-11-06 DIAGNOSIS — M8580 Other specified disorders of bone density and structure, unspecified site: Secondary | ICD-10-CM | POA: Diagnosis not present

## 2022-11-06 DIAGNOSIS — Z853 Personal history of malignant neoplasm of breast: Secondary | ICD-10-CM | POA: Diagnosis not present

## 2022-11-06 DIAGNOSIS — I2510 Atherosclerotic heart disease of native coronary artery without angina pectoris: Secondary | ICD-10-CM | POA: Diagnosis not present

## 2022-11-06 LAB — CBC WITH DIFFERENTIAL/PLATELET
Abs Immature Granulocytes: 0.05 10*3/uL (ref 0.00–0.07)
Basophils Absolute: 0 10*3/uL (ref 0.0–0.1)
Basophils Relative: 0 %
Eosinophils Absolute: 0 10*3/uL (ref 0.0–0.5)
Eosinophils Relative: 0 %
HCT: 47 % — ABNORMAL HIGH (ref 36.0–46.0)
Hemoglobin: 15.5 g/dL — ABNORMAL HIGH (ref 12.0–15.0)
Immature Granulocytes: 1 %
Lymphocytes Relative: 15 %
Lymphs Abs: 1.4 10*3/uL (ref 0.7–4.0)
MCH: 31 pg (ref 26.0–34.0)
MCHC: 33 g/dL (ref 30.0–36.0)
MCV: 94 fL (ref 80.0–100.0)
Monocytes Absolute: 0.5 10*3/uL (ref 0.1–1.0)
Monocytes Relative: 6 %
Neutro Abs: 7.2 10*3/uL (ref 1.7–7.7)
Neutrophils Relative %: 78 %
Platelets: 183 10*3/uL (ref 150–400)
RBC: 5 MIL/uL (ref 3.87–5.11)
RDW: 14 % (ref 11.5–15.5)
WBC: 9.2 10*3/uL (ref 4.0–10.5)
nRBC: 0 % (ref 0.0–0.2)

## 2022-11-06 LAB — CBC
HCT: 47.5 % — ABNORMAL HIGH (ref 36.0–46.0)
Hemoglobin: 15.5 g/dL — ABNORMAL HIGH (ref 12.0–15.0)
MCH: 31.1 pg (ref 26.0–34.0)
MCHC: 32.6 g/dL (ref 30.0–36.0)
MCV: 95.2 fL (ref 80.0–100.0)
Platelets: 160 10*3/uL (ref 150–400)
RBC: 4.99 MIL/uL (ref 3.87–5.11)
RDW: 14.1 % (ref 11.5–15.5)
WBC: 9.6 10*3/uL (ref 4.0–10.5)
nRBC: 0 % (ref 0.0–0.2)

## 2022-11-06 LAB — COMPREHENSIVE METABOLIC PANEL
ALT: 21 U/L (ref 0–44)
ALT: 21 U/L (ref 0–44)
AST: 22 U/L (ref 15–41)
AST: 25 U/L (ref 15–41)
Albumin: 4.1 g/dL (ref 3.5–5.0)
Albumin: 4.1 g/dL (ref 3.5–5.0)
Alkaline Phosphatase: 74 U/L (ref 38–126)
Alkaline Phosphatase: 75 U/L (ref 38–126)
Anion gap: 14 (ref 5–15)
Anion gap: 14 (ref 5–15)
BUN: 29 mg/dL — ABNORMAL HIGH (ref 8–23)
BUN: 30 mg/dL — ABNORMAL HIGH (ref 8–23)
CO2: 23 mmol/L (ref 22–32)
CO2: 20 mmol/L — ABNORMAL LOW (ref 22–32)
Calcium: 8.9 mg/dL (ref 8.9–10.3)
Calcium: 9.2 mg/dL (ref 8.9–10.3)
Chloride: 101 mmol/L (ref 98–111)
Chloride: 103 mmol/L (ref 98–111)
Creatinine, Ser: 3.34 mg/dL — ABNORMAL HIGH (ref 0.44–1.00)
Creatinine, Ser: 3.34 mg/dL — ABNORMAL HIGH (ref 0.44–1.00)
GFR, Estimated: 14 mL/min — ABNORMAL LOW (ref >60)
GFR, Estimated: 14 mL/min — ABNORMAL LOW (ref >60)
Glucose, Bld: 114 mg/dL — ABNORMAL HIGH (ref 70–99)
Glucose, Bld: 92 mg/dL (ref 70–99)
Potassium: 3.5 mmol/L (ref 3.5–5.1)
Potassium: 3.8 mmol/L (ref 3.5–5.1)
Sodium: 138 mmol/L (ref 135–145)
Sodium: 137 mmol/L (ref 135–145)
Total Bilirubin: 0.6 mg/dL (ref 0.3–1.2)
Total Bilirubin: 0.6 mg/dL (ref 0.3–1.2)
Total Protein: 7.4 g/dL (ref 6.5–8.1)
Total Protein: 7.6 g/dL (ref 6.5–8.1)

## 2022-11-06 LAB — RESP PANEL BY RT-PCR (FLU A&B, COVID) ARPGX2
Influenza A by PCR: NEGATIVE
Influenza B by PCR: NEGATIVE
SARS Coronavirus 2 by RT PCR: NEGATIVE

## 2022-11-06 LAB — LACTIC ACID, PLASMA: Lactic Acid, Venous: 2.1 mmol/L (ref 0.5–1.9)

## 2022-11-06 LAB — TROPONIN I (HIGH SENSITIVITY): Troponin I (High Sensitivity): 27 ng/L — ABNORMAL HIGH (ref <18)

## 2022-11-06 LAB — PROCALCITONIN: Procalcitonin: 0.1 ng/mL

## 2022-11-06 MED ORDER — KETOCONAZOLE 2 % EX SHAM
1.0000 | MEDICATED_SHAMPOO | CUTANEOUS | Status: DC
  Filled 2022-11-06: qty 120

## 2022-11-06 MED ORDER — ALBUTEROL SULFATE (2.5 MG/3ML) 0.083% IN NEBU: 2.5000 mg | INHALATION_SOLUTION | Freq: Four times a day (QID) | RESPIRATORY_TRACT | Status: DC | PRN

## 2022-11-06 MED ORDER — ZINC SULFATE 220 (50 ZN) MG PO CAPS
220.0000 mg | ORAL_CAPSULE | Freq: Every day | ORAL | Status: DC
  Administered 2022-11-07: 220 mg via ORAL
  Filled 2022-11-06: qty 1

## 2022-11-06 MED ORDER — VENLAFAXINE HCL ER 75 MG PO CP24
75.0000 mg | ORAL_CAPSULE | Freq: Three times a day (TID) | ORAL | Status: DC
  Administered 2022-11-06 – 2022-11-07 (×2): 75 mg via ORAL
  Filled 2022-11-06 (×3): qty 1

## 2022-11-06 MED ORDER — DICYCLOMINE HCL 10 MG PO CAPS
10.0000 mg | ORAL_CAPSULE | Freq: Two times a day (BID) | ORAL | Status: DC
  Administered 2022-11-07: 10 mg via ORAL
  Filled 2022-11-06 (×2): qty 1

## 2022-11-06 MED ORDER — ONDANSETRON HCL 4 MG/2ML IJ SOLN: 4.0000 mg | Freq: Four times a day (QID) | INTRAMUSCULAR | Status: DC | PRN

## 2022-11-06 MED ORDER — BACLOFEN 10 MG PO TABS: 5.0000 mg | ORAL_TABLET | Freq: Every evening | ORAL | Status: DC | PRN

## 2022-11-06 MED ORDER — FAMOTIDINE 20 MG PO TABS
20.0000 mg | ORAL_TABLET | Freq: Two times a day (BID) | ORAL | Status: DC
  Administered 2022-11-06 – 2022-11-07 (×2): 20 mg via ORAL
  Filled 2022-11-06 (×2): qty 1

## 2022-11-06 MED ORDER — ONDANSETRON HCL 4 MG PO TABS: 4.0000 mg | ORAL_TABLET | Freq: Four times a day (QID) | ORAL | Status: DC | PRN

## 2022-11-06 MED ORDER — ENOXAPARIN SODIUM 30 MG/0.3ML IJ SOSY
30.0000 mg | PREFILLED_SYRINGE | INTRAMUSCULAR | Status: DC
  Administered 2022-11-06: 30 mg via SUBCUTANEOUS
  Filled 2022-11-06: qty 0.3

## 2022-11-06 MED ORDER — SODIUM CHLORIDE 0.9 % IV SOLN: INTRAVENOUS | Status: DC

## 2022-11-06 MED ORDER — VITAMIN D 25 MCG (1000 UNIT) PO TABS
500.0000 [IU] | ORAL_TABLET | Freq: Every day | ORAL | Status: DC
  Administered 2022-11-07: 500 [IU] via ORAL
  Filled 2022-11-06: qty 1

## 2022-11-06 MED ORDER — ATORVASTATIN CALCIUM 20 MG PO TABS: 80.0000 mg | ORAL_TABLET | Freq: Every day | ORAL | Status: DC

## 2022-11-06 MED ORDER — ANASTROZOLE 1 MG PO TABS
1.0000 mg | ORAL_TABLET | Freq: Every day | ORAL | Status: DC
  Administered 2022-11-07: 1 mg via ORAL
  Filled 2022-11-06: qty 1

## 2022-11-06 MED ORDER — SODIUM CHLORIDE 0.9 % IV BOLUS
500.0000 mL | Freq: Once | INTRAVENOUS | Status: AC
  Administered 2022-11-06: 500 mL via INTRAVENOUS

## 2022-11-06 MED ORDER — DOCUSATE SODIUM 100 MG PO CAPS
100.0000 mg | ORAL_CAPSULE | Freq: Two times a day (BID) | ORAL | Status: DC
  Administered 2022-11-06 – 2022-11-07 (×2): 100 mg via ORAL
  Filled 2022-11-06 (×2): qty 1

## 2022-11-06 MED ORDER — ACETAMINOPHEN 650 MG RE SUPP: 650.0000 mg | Freq: Four times a day (QID) | RECTAL | Status: DC | PRN

## 2022-11-06 MED ORDER — ADULT MULTIVITAMIN W/MINERALS CH
1.0000 | ORAL_TABLET | Freq: Every day | ORAL | Status: DC
  Administered 2022-11-07: 1 via ORAL
  Filled 2022-11-06: qty 1

## 2022-11-06 MED ORDER — ASPIRIN 81 MG PO TBEC
81.0000 mg | DELAYED_RELEASE_TABLET | Freq: Every day | ORAL | Status: DC
  Administered 2022-11-07: 81 mg via ORAL
  Filled 2022-11-06: qty 1

## 2022-11-06 MED ORDER — ACETAMINOPHEN 325 MG PO TABS: 650.0000 mg | ORAL_TABLET | Freq: Four times a day (QID) | ORAL | Status: DC | PRN

## 2022-11-06 MED ORDER — TRAZODONE HCL 50 MG PO TABS: 25.0000 mg | ORAL_TABLET | Freq: Every evening | ORAL | Status: DC | PRN

## 2022-11-06 MED ORDER — SODIUM CHLORIDE 0.9 % IV BOLUS
1000.0000 mL | Freq: Once | INTRAVENOUS | Status: AC
  Administered 2022-11-06: 1000 mL via INTRAVENOUS

## 2022-11-06 MED ORDER — GABAPENTIN 300 MG PO CAPS
600.0000 mg | ORAL_CAPSULE | Freq: Three times a day (TID) | ORAL | Status: DC
  Administered 2022-11-06 – 2022-11-07 (×3): 600 mg via ORAL
  Filled 2022-11-06 (×3): qty 2

## 2022-11-06 MED ORDER — CALCIUM CARBONATE ANTACID 500 MG PO CHEW: 750.0000 mg | CHEWABLE_TABLET | Freq: Every day | ORAL | Status: DC | PRN

## 2022-11-06 MED ORDER — AMLODIPINE BESYLATE 5 MG PO TABS
5.0000 mg | ORAL_TABLET | Freq: Every day | ORAL | Status: DC
  Administered 2022-11-07: 5 mg via ORAL
  Filled 2022-11-06: qty 1

## 2022-11-06 MED ORDER — CLOBETASOL PROPIONATE 0.05 % EX CREA
1.0000 | TOPICAL_CREAM | Freq: Every day | CUTANEOUS | Status: DC
  Filled 2022-11-06: qty 15

## 2022-11-06 MED ORDER — VITAMIN B-12 1000 MCG PO TABS
1000.0000 ug | ORAL_TABLET | Freq: Every day | ORAL | Status: DC
  Administered 2022-11-07: 1000 ug via ORAL
  Filled 2022-11-06: qty 2

## 2022-11-06 NOTE — ED Notes (Signed)
Lactic 2.1 reported to Dr Lasandra Beech

## 2022-11-06 NOTE — Assessment & Plan Note (Addendum)
-   This is likely the culprit for #1. - Management as above. - As needed antiemetics and antidiarrheals will be utilized.

## 2022-11-06 NOTE — Assessment & Plan Note (Signed)
-   We will continue her inhalers.

## 2022-11-06 NOTE — ED Notes (Signed)
Pt disconnected from Bipap at this time. Placed on 2l T[place], Sao2 97%

## 2022-11-06 NOTE — Assessment & Plan Note (Signed)
-   We will continue statin therapy. 

## 2022-11-06 NOTE — Assessment & Plan Note (Signed)
-   With improvement of her blood pressure amlodipine will be continued while holding off Zestoretic given acute kidney injury.

## 2022-11-06 NOTE — ED Provider Triage Note (Signed)
Emergency Medicine Provider Triage Evaluation Note  Barbara Contreras , a 71 y.o. female  was evaluated in triage.  Pt complains of breast cancer undergoing chemo and radiation. Reports very dizzy, diarrhea and vomiting.  Last radiation was 11/28  Review of Systems  Positive: Lightheaded Negative: Pain, fever  Physical Exam  BP (!) 71/53   Pulse 80   Temp 97.7 F (36.5 C) (Oral)   Resp 18   Ht '5\' 5"'$  (1.651 m)   Wt 73 kg   SpO2 97%   BMI 26.79 kg/m  Gen:   Awake, no distress   Resp:  Normal effort  MSK:   Moves extremities without difficulty  Other:    Medical Decision Making  Medically screening exam initiated at 4:36 PM.  Appropriate orders placed.  Daphine Deutscher was informed that the remainder of the evaluation will be completed by another provider, this initial triage assessment does not replace that evaluation, and the importance of remaining in the ED until their evaluation is complete.     Marquette Old, PA-C 11/06/22 1639

## 2022-11-06 NOTE — Assessment & Plan Note (Signed)
-   She was placed on PPI therapy.

## 2022-11-06 NOTE — Assessment & Plan Note (Addendum)
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  S/p postlumpectomy radiation; 11/28 last RT. Currently on Anastrazole [since jan 2023]  # Continue anastrozole as ordered anastrozole; patient started on December 04, 2021.  See discussion below-   # OSTEOPENIA- [FEB 2022]T-score of -2.2.  Continue calcium plus vitamin D.  # Acute renal failure- GFR 12/prerenal versus others-diarrhea poor p.o. intake.  Systolic blood pressure 12W.  Recommend holding hydrochlorothiazide/lisinopril.  Discussed with nephrology.  Recommend urgent evaluation in the emergency room for IV fluids and further workup.  # Dizzy spells likely secondary to hypotension/prerenal diarrhea-further workup in the emergency room.  # DISPOSITION: # follow up in 3 months-  MD;  labs- cbc/cmp-- Dr.B

## 2022-11-06 NOTE — ED Notes (Signed)
Attempted IV access x2.  ?

## 2022-11-06 NOTE — H&P (Signed)
[place]   PATIENT NAME: Barbara Contreras    MR#:  [id number]  DATE OF BIRTH:  1951-09-19  DATE OF ADMISSION:  11/06/2022  PRIMARY CARE PHYSICIAN: Barbara Castle, MD   Patient is coming from: Home  REQUESTING/REFERRING PHYSICIAN: Duffy Bruce, MD  CHIEF COMPLAINT:   Chief Complaint  Patient presents with   Dizziness    HISTORY OF PRESENT ILLNESS:  Barbara Contreras is a 71 y.o. Caucasian female with medical history significant for asthma, carotid stenosis, coronary artery disease, depression, emphysema, GERD, hypertension, dyslipidemia, seizure disorder, IBS and breast cancer status post lumpectomy and radiation on Arimidex since 12/2021, who presented to the emergency room with acute on of recurrent diarrhea with 4-5 loose bowel movements since last night and associated poor appetite and extreme dizziness.  She has been having associated nausea and vomiting.  She had a left neck CTA that showed significant left-sided occlusion and has been evaluated by vascular surgery for her dizziness.  She had rib fracture after a fall and was recently admitted for altered mental status that was thought to be related to alcohol and baclofen and as well as acute kidney injury.  No fever or chills.  No chest pain or palpitations.  No cough or wheezing or dyspnea.  ED Course: When she came to the ER, temperature was 95.9-97.7, BP was 128/117 and later dropped to 77/50 with heart rate of 116.  It came up later on with hydration to 101/55 with heart rate of 73.  CBC showed hemoconcentration CMP revealed a BUN of 30 and creatinine 3.34 which were previously normal.  High sensitive troponin was 27 and lactic acid 2.1 procalcitonin 0.1.  Fons antigens and COVID-19 PCR came back -2 blood cultures were drawn.  EKG as reviewed by me : EKG showed likely ectopic rhythm with a rate of 77 with short PR interval and LVH and lateral T wave inversion in lateral leads.  Imaging: Abdominal and pelvic CT  scan revealed left colonic diverticulosis without diverticulitis, aortic atherosclerosis with no acute findings in the abdomen or pelvis.  The patient was given 2.5 L bolus of IV normal saline.  She will be admitted to a medical bed for further evaluation and management. PAST MEDICAL HISTORY:   Past Medical History:  Diagnosis Date   Arthritis    Asthma    Back pain    Breast cancer ([place]) 2022   Carotid artery stenosis    Carotid stenosis    Coronary artery disease    Depression    Emphysema of lung (HCC)    GERD (gastroesophageal reflux disease)    Heart murmur    Hip pain    History of Lyme disease    HLD (hyperlipidemia)    Hyperlipemia    Hypertension    IBS (irritable bowel syndrome)    IBS (irritable bowel syndrome)    Lyme disease    Pre-diabetes    Sciatica    Sciatica    Seizures (HCC)    Tremor    Vitamin D deficiency    Vitamin D deficiency     PAST SURGICAL HISTORY:   Past Surgical History:  Procedure Laterality Date   ABDOMINAL HYSTERECTOMY     BREAST BIOPSY Right 07/22/2021   [place] bx 1:00 area, venus marker, IMC tubular features   BREAST BIOPSY Right 07/22/2021   [place] bx 2:00 ribbon marker, fibroadenoma   BREAST LUMPECTOMY Right 08/24/2021   re-excision   BREAST LUMPECTOMY Right 08/12/2021  CAROTID ENDARTERECTOMY Right    CHOLECYSTECTOMY     COLONOSCOPY WITH PROPOFOL N/A 09/13/2018   Procedure: COLONOSCOPY WITH PROPOFOL;  Surgeon: Lollie Sails, MD;  Location: [facility] ENDOSCOPY;  Service: Endoscopy;  Laterality: N/A;   ESOPHAGOGASTRODUODENOSCOPY N/A 05/03/2021   Procedure: ESOPHAGOGASTRODUODENOSCOPY (EGD);  Surgeon: Lesly Rubenstein, MD;  Location: [facility] ENDOSCOPY;  Service: Endoscopy;  Laterality: N/A;   paranasal sinusotomy     PART MASTECTOMY,RADIO FREQUENCY LOCALIZER,AXILLARY SENTINEL NODE BIOPSY Right 08/12/2021   Procedure: PART MASTECTOMY,RADIO FREQUENCY LOCALIZER,AXILLARY SENTINEL NODE BIOPSY;  Surgeon: Benjamine Sprague, DO;  Location: [facility]  ORS;  Service: General;  Laterality: Right;   RE-EXCISION OF BREAST CANCER,SUPERIOR MARGINS Right 08/24/2021   Procedure: RE-EXCISION OF BREAST CANCER,SUPERIOR MARGINS;  Surgeon: Benjamine Sprague, DO;  Location: [facility] ORS;  Service: General;  Laterality: Right;    SOCIAL HISTORY:   Social History   Tobacco Use   Smoking status: Every Day    Packs/day: 0.50    Years: 55.00    Total pack years: 27.50    Types: Cigarettes   Smokeless tobacco: Never  Substance Use Topics   Alcohol use: Yes    Alcohol/week: 0.0 standard drinks of alcohol    Comment: occasional    FAMILY HISTORY:   Family History  Problem Relation Age of Onset   Alcohol abuse Father    Alzheimer's disease Father    Anxiety disorder Father    Depression Father    Alcohol abuse Mother    CAD Mother    Diabetes Mother    Hyperlipidemia Mother    Depression Mother     DRUG ALLERGIES:   Allergies  Allergen Reactions   Morphine And Related Itching   Clindamycin Other (See Comments)   Erythromycin     Other reaction(s): Other (see comments)   Other     Other reaction(s): Other (See Comments) Redness and itching   Penicillins Other (See Comments)    TOLERATED CEFAZOLIN PRIOR Reaction: unknown Has patient had a PCN reaction causing immediate rash, facial/tongue/throat swelling, SOB or lightheadedness with hypotension: Unknown Has patient had a PCN reaction causing severe rash involving mucus membranes or skin necrosis: Unknown Has patient had a PCN reaction that required hospitalization: Unknown Has patient had a PCN reaction occurring within the last 10 years: no If all of the above answers are "NO", then may proceed with Cephalosporin use.   Promethazine-Phenylephrine Other (See Comments)    Muscle spasm   Tape Other (See Comments)    Redness and itching    REVIEW OF SYSTEMS:   ROS As per history of present illness. All pertinent systems were reviewed above. Constitutional, HEENT, cardiovascular,  respiratory, GI, GU, musculoskeletal, neuro, psychiatric, endocrine, integumentary and hematologic systems were reviewed and are otherwise negative/unremarkable except for positive findings mentioned above in the HPI.   MEDICATIONS AT HOME:   Prior to Admission medications   Medication Sig Start Date End Date Taking? Authorizing Provider  albuterol (VENTOLIN HFA) 108 (90 Base) MCG/ACT inhaler Inhale 1-2 puffs into the lungs every 6 (six) hours as needed for wheezing or shortness of breath.    [provider]  amLODipine (NORVASC) 5 MG tablet Take 5 mg by mouth daily. 03/10/22   [provider]  anastrozole (ARIMIDEX) 1 MG tablet TAKE 1 TABLET BY MOUTH EVERY DAY 10/30/22   Cammie Sickle, MD  aspirin EC 81 MG tablet Take 81 mg by mouth daily.    [provider]  atorvastatin (LIPITOR) 80 MG tablet Take 80 mg  by mouth daily. 10/01/22   [provider]  baclofen (LIORESAL) 10 MG tablet Take 5-10 mg by mouth at bedtime as needed for muscle spasms. 06/08/21   [provider]  calcium carbonate (TUMS EX) 750 MG chewable tablet Chew 1 tablet by mouth daily as needed for heartburn.    [provider]  Cholecalciferol (VITAMIN D3) 10 MCG (400 UNIT) tablet Take 400 Units by mouth daily.    [provider]  clobetasol (TEMOVATE) 0.05 % external solution Apply 1 Application topically daily. 09/18/22   [provider]  dicyclomine (BENTYL) 10 MG capsule Take 10 mg by mouth in the morning and at bedtime.    [provider]  docusate sodium (COLACE) 100 MG capsule Take 100 mg by mouth 2 (two) times daily. 08/24/21   [provider]  famotidine (PEPCID) 20 MG tablet Take 20 mg by mouth 2 (two) times daily. 07/17/22   [provider]  gabapentin (NEURONTIN) 600 MG tablet Take 600 mg by mouth 3 (three) times daily. 01/24/16   [provider]  ketoconazole (NIZORAL) 2 % shampoo Apply 1 application topically  once a week.    [provider]  lisinopril-hydrochlorothiazide (PRINZIDE,ZESTORETIC) 20-12.5 MG tablet Take 1 tablet by mouth in the morning and at bedtime.    [provider]  Multiple Vitamin (MULTIVITAMIN WITH MINERALS) TABS tablet Take 1 tablet by mouth daily.    [provider]  tiotropium (SPIRIVA) 18 MCG inhalation capsule Place 18 mcg into inhaler and inhale every morning. Patient not taking: Reported on 10/01/2022    [provider]  venlafaxine XR (EFFEXOR-XR) 75 MG 24 hr capsule Take 75 mg by mouth 3 (three) times daily.    [provider]  vitamin B-12 (CYANOCOBALAMIN) 1000 MCG tablet Take 1,000 mcg by mouth daily.    [provider]  Zinc Sulfate (ZINC 15 PO) Take 15 mg by mouth daily.    [provider]      VITAL SIGNS:  Blood pressure (!) 101/55, pulse 73, temperature 97.8 F (36.6 C), resp. rate 16, height '5\' 5"'$  (1.651 m), weight 73 kg, SpO2 92 %.  PHYSICAL EXAMINATION:  Physical Exam  GENERAL:  71 y.o.-year-old Caucasian patient lying in the bed with no acute distress.  EYES: Pupils equal, round, reactive to light and accommodation. No scleral icterus. Extraocular muscles intact.  HEENT: Head atraumatic, normocephalic. Oropharynx and nasopharynx clear.  NECK:  Supple, no jugular venous distention. No thyroid enlargement, no tenderness.  LUNGS: Normal breath sounds bilaterally, no wheezing, rales,rhonchi or crepitation. No use of accessory muscles of respiration.  CARDIOVASCULAR: Regular rate and rhythm, S1, S2 normal.  2/6 systolic ejection murmur at the left lower sternal border with no rubs, or gallops.  ABDOMEN: Soft, nondistended, nontender. Bowel sounds present. No organomegaly or mass.  EXTREMITIES: No pedal edema, cyanosis, or clubbing.  NEUROLOGIC: Cranial nerves II through XII are intact. Muscle strength 5/5 in all extremities. Sensation intact. Gait not checked.  PSYCHIATRIC: The patient is alert  and oriented x 3.  Normal affect and good eye contact. SKIN: No obvious rash, lesion, or ulcer.   LABORATORY PANEL:   CBC Recent Labs  Lab 11/06/22 1638  WBC 9.6  HGB 15.5*  HCT 47.5*  PLT 160   ------------------------------------------------------------------------------------------------------------------  Chemistries  Recent Labs  Lab 11/06/22 1638  NA 137  K 3.8  CL 103  CO2 20*  GLUCOSE 92  BUN 30*  CREATININE 3.34*  CALCIUM 9.2  AST 25  ALT 21  ALKPHOS 75  BILITOT 0.6   ------------------------------------------------------------------------------------------------------------------  Cardiac Enzymes No results for input(s): "TROPONINI" in the last 168 hours. ------------------------------------------------------------------------------------------------------------------  RADIOLOGY:  CT ABDOMEN PELVIS WO CONTRAST  Result Date: 11/06/2022 CLINICAL DATA:  Abdominal pain EXAM: CT ABDOMEN AND PELVIS WITHOUT CONTRAST TECHNIQUE: Multidetector CT imaging of the abdomen and pelvis was performed following the standard protocol without IV contrast. RADIATION DOSE REDUCTION: This exam was performed according to the departmental dose-optimization program which includes automated exposure control, adjustment of the mA and/or kV according to patient size and/or use of iterative reconstruction technique. COMPARISON:  09/01/2022 FINDINGS: Lower chest: Scarring in the right middle lobe at the lung base. No acute abnormality. Aortic atherosclerosis. Hepatobiliary: No focal liver abnormality is seen. Status post cholecystectomy. No biliary dilatation. Pancreas: No focal abnormality or ductal dilatation. Spleen: No focal abnormality.  Normal size. Adrenals/Urinary Tract: Low-density left adrenal nodule measures 1.7 cm with diffuse enlargement of the left adrenal gland. Findings most compatible with adenoma and/or hyperplasia. Mild fullness of the right adrenal gland compatible with  hyperplasia. No renal or ureteral stones. No hydronephrosis. Urinary bladder unremarkable. Stomach/Bowel: Normal appendix. Left colonic diverticulosis. No active diverticulitis. Stomach and small bowel decompressed, unremarkable. Vascular/Lymphatic: Aortic atherosclerosis. Reproductive: Prior hysterectomy.  No adnexal masses. Other: No free fluid or free air. Musculoskeletal: No acute bony abnormality. IMPRESSION: No acute findings in the abdomen or pelvis. Left colonic diverticulosis.  No active diverticulitis. Aortic atherosclerosis. Electronically Signed   By: Rolm Baptise M.D.   On: 11/06/2022 19:25   DG Chest Port 1 View  Result Date: 11/06/2022 CLINICAL DATA:  Hypotension and dizziness. EXAM: PORTABLE CHEST 1 VIEW COMPARISON:  Chest x-ray 06/16/2017 FINDINGS: The heart size and mediastinal contours are within normal limits. Both lungs are clear. The visualized skeletal structures are unremarkable. IMPRESSION: No active disease. Electronically Signed   By: Ronney Asters M.D.   On: 11/06/2022 18:05      IMPRESSION AND PLAN:  Assessment and Plan: * AKI (acute kidney injury) ([place]) -This is likely prerenal due to volume depletion and dehydration from acute gastroenteritis. - The patient will be admitted to a medical bed.  Next-we will continue hydration with IV normal saline. - We will follow her BMP. - We will avoid nephrotoxins.  Dyslipidemia - We will continue statin therapy.  Acute gastroenteritis - This is likely the culprit for #1. - Management as above. - As needed antiemetics and antidiarrheals will be utilized.  Asthma, chronic - We will continue her inhalers.  Essential hypertension - With improvement of her blood pressure amlodipine will be continued while holding off Zestoretic given acute kidney injury.  GERD without esophagitis - She was placed on PPI therapy.    DVT prophylaxis: Lovenox. Advanced Care Planning:  Code Status: full code. Family Communication:  The  plan of care was discussed in details with the patient (and family). I answered all questions. The patient agreed to proceed with the above mentioned plan. Further management will depend upon hospital course. Disposition Plan: Back to previous home environment Consults called: none. All the records are reviewed and case discussed with ED provider.  Status is: Inpatient  At the time of the admission, it appears that the appropriate admission status for this patient is inpatient.  This is judged to be reasonable and necessary in order to provide the required intensity of service to ensure the patient's safety given the presenting symptoms, physical exam findings and initial radiographic and laboratory data in the context of  comorbid conditions.  The patient requires inpatient status due to high intensity of service, high risk of further deterioration and high frequency of surveillance required.  I certify that at the time of admission, it is my clinical judgment that the patient will require inpatient hospital care extending more than 2 midnights.                            Dispo: The patient is from: Home              Anticipated d/c is to: Home              Patient currently is not medically stable to d/c.              Difficult to place patient: No  Christel Mormon M.D on 11/06/2022 at 10:26 PM  [facility]   From 7 PM-7 AM, contact night-coverage [email or web address]  CC: Primary care physician; Barbara Castle, MD

## 2022-11-06 NOTE — Progress Notes (Signed)
Patient states she think she has a [employer]vertising account planner" because she is extremely dizzy. Patient states she has thrown up and has diarrhea also. Patient has no energy.

## 2022-11-06 NOTE — Assessment & Plan Note (Addendum)
-  This is likely prerenal due to volume depletion and dehydration from acute gastroenteritis. - The patient will be admitted to a medical bed.  Next-we will continue hydration with IV normal saline. - We will follow her BMP. - We will avoid nephrotoxins.

## 2022-11-06 NOTE — ED Provider Notes (Signed)
[facility] Provider Note    Event Date/Time   First MD Initiated Contact with Patient 11/06/22 1652     (approximate)   History   Dizziness   HPI  Barbara Contreras is a 71 y.o. female with past medical history of breast cancer, asthma, GERD, COPD, hypertension, hyperlipidemia, here with generalized fatigue and lightheadedness.  Patient was sent from oncology clinic.  She reportedly has had decreasing appetite and has had persistent diarrhea.  She states that she went to an oncology follow-up today and was told to come to the ER due to being hypotensive.  Patient states that she has been very lightheaded whenever she stands.  She states she had some decreased urine output.  She has had persistent, watery diarrhea that has not been bloody.  No recent antibiotic use.     Physical Exam   Triage Vital Signs: ED Triage Vitals  Enc Vitals Group     BP 11/06/22 1635 (!) 71/53     Pulse Rate 11/06/22 1633 (!) 103     Resp 11/06/22 1633 18     Temp 11/06/22 1633 97.7 F (36.5 C)     Temp Source 11/06/22 1633 Oral     SpO2 11/06/22 1635 97 %     Weight 11/06/22 1634 161 lb (73 kg)     Height 11/06/22 1634 '5\' 5"'$  (1.651 m)     Head Circumference --      Peak Flow --      Pain Score 11/06/22 1634 0     Pain Loc --      Pain Edu? --      Excl. in [place]? --     Most recent vital signs: Vitals:   11/06/22 1800 11/06/22 2301  BP: (!) 101/55 (!) 112/56  Pulse: 73 72  Resp: 16 16  Temp: 97.8 F (36.6 C) 97.7 F (36.5 C)  SpO2: 92% 98%     General: Awake, no distress.  CV:  Good peripheral perfusion.  Regular rate and rhythm.  Systolic murmur appreciated. Resp:  Normal effort.  Abd:  No distention.  Minimal lower abdominal tenderness.  No rebound or guarding.  No distention. Other:  Dry mucous membranes.   ED Results / Procedures / Treatments   Labs (all labs ordered are listed, but only abnormal results are displayed) Labs Reviewed  CBC -  Abnormal; Notable for the following components:      Result Value   Hemoglobin 15.5 (*)    HCT 47.5 (*)    All other components within normal limits  COMPREHENSIVE METABOLIC PANEL - Abnormal; Notable for the following components:   CO2 20 (*)    BUN 30 (*)    Creatinine, Ser 3.34 (*)    GFR, Estimated 14 (*)    All other components within normal limits  LACTIC ACID, PLASMA - Abnormal; Notable for the following components:   Lactic Acid, Venous 2.1 (*)    All other components within normal limits  TROPONIN I (HIGH SENSITIVITY) - Abnormal; Notable for the following components:   Troponin I (High Sensitivity) 27 (*)    All other components within normal limits  RESP PANEL BY RT-PCR (FLU A&B, COVID) ARPGX2  CULTURE, BLOOD (ROUTINE X 2)  CULTURE, BLOOD (ROUTINE X 2)  PROCALCITONIN  URINALYSIS, ROUTINE W REFLEX MICROSCOPIC  LACTIC ACID, PLASMA  BASIC METABOLIC PANEL  CBC     EKG Sinus rhythm, ventricular rate 77.  PR 112, QRS 91, QTc 457.  No acute ST elevations or depressions.  Nonspecific T wave inversions.   RADIOLOGY Chest x-ray: No active disease CT abdomen/pelvis: No acute findings no active diverticulitis  I also independently reviewed and agree with radiologist interpretations.   PROCEDURES:  Critical Care performed: No  .1-3 Lead EKG Interpretation  Performed by: Duffy Bruce, MD Authorized by: Duffy Bruce, MD     Interpretation: normal     ECG rate:  70-90   ECG rate assessment: normal     Rhythm: sinus rhythm     Ectopy: none     Conduction: normal   Comments:     Indication: weakness     MEDICATIONS ORDERED IN ED: Medications  aspirin EC tablet 81 mg (has no administration in time range)  anastrozole (ARIMIDEX) tablet 1 mg (has no administration in time range)  amLODipine (NORVASC) tablet 5 mg (has no administration in time range)  atorvastatin (LIPITOR) tablet 80 mg (has no administration in time range)  venlafaxine XR (EFFEXOR-XR) 24 hr  capsule 75 mg (75 mg Oral Given 11/06/22 2254)  calcium carbonate (TUMS - dosed in mg elemental calcium) chewable tablet 750 mg (has no administration in time range)  dicyclomine (BENTYL) capsule 10 mg (has no administration in time range)  docusate sodium (COLACE) capsule 100 mg (100 mg Oral Given 11/06/22 2255)  famotidine (PEPCID) tablet 20 mg (20 mg Oral Given 11/06/22 2254)  cyanocobalamin (VITAMIN B12) tablet 1,000 mcg (has no administration in time range)  baclofen (LIORESAL) tablet 5-10 mg (has no administration in time range)  gabapentin (NEURONTIN) capsule 600 mg (600 mg Oral Given 11/06/22 2254)  cholecalciferol (VITAMIN D3) 25 MCG (1000 UNIT) tablet 500 Units (has no administration in time range)  multivitamin with minerals tablet 1 tablet (has no administration in time range)  zinc sulfate capsule 220 mg (has no administration in time range)  albuterol (PROVENTIL) (2.5 MG/3ML) 0.083% nebulizer solution 2.5 mg (has no administration in time range)  clobetasol (TEMOVATE) 0.05 % external solution 1 Application (has no administration in time range)  ketoconazole (NIZORAL) 2 % shampoo 1 Application (has no administration in time range)  enoxaparin (LOVENOX) injection 30 mg (30 mg Subcutaneous Given 11/06/22 2255)  0.9 %  sodium chloride infusion ( Intravenous New Bag/Given 11/06/22 2224)  acetaminophen (TYLENOL) tablet 650 mg (has no administration in time range)    Or  acetaminophen (TYLENOL) suppository 650 mg (has no administration in time range)  traZODone (DESYREL) tablet 25 mg (has no administration in time range)  ondansetron (ZOFRAN) tablet 4 mg (has no administration in time range)    Or  ondansetron (ZOFRAN) injection 4 mg (has no administration in time range)  sodium chloride 0.9 % bolus 1,000 mL (0 mLs Intravenous Stopped 11/06/22 1929)  sodium chloride 0.9 % bolus 1,000 mL (0 mLs Intravenous Stopped 11/06/22 1930)  sodium chloride 0.9 % bolus 500 mL (0 mLs Intravenous Stopped  11/06/22 2022)     IMPRESSION / MDM / ASSESSMENT AND PLAN / ED COURSE  I reviewed the triage vital signs and the nursing notes.                              Differential diagnosis includes, but is not limited to, colitis, diverticulitis, medication/chemo adverse effect, dehydration, food-borne illness, anemia.  Patient's presentation is most consistent with acute presentation with potential threat to life or bodily function.  The patient is on the cardiac monitor to evaluate for evidence of  arrhythmia and/or significant heart rate changes.  72 yo F with PMHx asthma, carotid stenosis, CAD, GERD, breast CA on Arimidex here with generalized weakness, lightheadendess. Pt hypotensive on arrival, tachycardic. IVF started. She has been having significant diarrhea and suspect she is hypovolemic. LA mildly elevated at 2.1. No fevers, abd is soft, no signs of significant infection at this time. COVID negative. WBC norma. CMP shows significant AKI with Cr 3.3, baseline <1. CT A/P negative for acute abnormality on mine and radiology read. CXR is clear.   Will admit for IVF hydration, monitoring of AKI and possibly GI work-up if diarrhea persists.     FINAL CLINICAL IMPRESSION(S) / ED DIAGNOSES   Final diagnoses:  Dehydration  AKI (acute kidney injury) ([place])     Rx / DC Orders   ED Discharge Orders     None        Note:  This document was prepared using Dragon voice recognition software and may include unintentional dictation errors.   Duffy Bruce, MD 11/07/22 [phone]

## 2022-11-06 NOTE — ED Notes (Signed)
Pt moved to CPOD RM 36

## 2022-11-06 NOTE — Progress Notes (Signed)
[facility] [place] NOTE  Patient Care Team: Kym Groom Guy Begin, MD as PCP - General (Family Medicine) Rico Junker, RN as Oncology Nurse Navigator Cammie Sickle, MD as Consulting Physician (Oncology) Noreene Filbert, MD as Consulting Physician (Radiation Oncology) Erby Pian, MD as Referring Physician (Pulmonary Disease) Benjamine Sprague, DO as Consulting Physician (Surgery) Corey Skains, MD as Consulting Physician (Cardiology)  CHIEF COMPLAINTS/PURPOSE OF CONSULTATION: Breast cancer  #  Oncology History Overview Note  # AUG 2022[facility] -Tubular; G-1 ER > 90%; PR 51-90%; her 2=0; [Dr.Sakai]  IMPRESSION: 1. There is a highly suspicious mass in the right breast at 1 o'clock measuring 8 mm.   2. There is an indeterminate right breast mass at 2 o'clock measuring 7 mm.   3.  No evidence of right axillary lymphadenopathy.   Carcinoma of upper-outer quadrant of right breast in female, estrogen receptor positive ([place])  08/03/2021 Initial Diagnosis   Carcinoma of upper-outer quadrant of right breast in female, estrogen receptor positive ([place])      HISTORY OF PRESENTING ILLNESS: Alone.  Ambulating independently.  Barbara Contreras 71 y.o.  female patient breast cancer ER/PR positive HER2 negative of positive stage I breast cancer currently status post lumpectomy followed by radiation.  Patient is currently on anastrozole since January 2023.  Patient stated she was recently admitted to hospital for mental status changes [alcohol plus baclofen]-acute renal failure.  Patient noted to have cracked ribs s/p fall.  In the interim patient also been evaluated by vascular surgery for ongoing dizziness.  Had a neck CTA that showed left-sided significant occlusion.  Patient currently complains of extreme dizziness.  Also complains of nausea vomiting and diarrhea.  At least 4-5 loose stools since last night. Appetite is poor.    Review of Systems   Constitutional:  Negative for chills, diaphoresis, fever, malaise/fatigue and weight loss.  HENT:  Negative for nosebleeds and sore throat.   Eyes:  Negative for double vision.  Respiratory:  Negative for cough, hemoptysis, sputum production, shortness of breath and wheezing.   Cardiovascular:  Negative for chest pain, palpitations, orthopnea and leg swelling.  Gastrointestinal:  Positive for diarrhea and vomiting. Negative for abdominal pain, blood in stool, constipation, heartburn, melena and nausea.  Genitourinary:  Negative for dysuria, frequency and urgency.  Musculoskeletal:  Positive for back pain, falls and joint pain.  Skin: Negative.  Negative for itching and rash.  Neurological:  Positive for dizziness and weakness. Negative for tingling, focal weakness and headaches.  Endo/Heme/Allergies:  Does not bruise/bleed easily.  Psychiatric/Behavioral:  Negative for depression. The patient is not nervous/anxious and does not have insomnia.      MEDICAL HISTORY:  Past Medical History:  Diagnosis Date   Arthritis    Asthma    Back pain    Breast cancer ([place]) 2022   Carotid artery stenosis    Carotid stenosis    Coronary artery disease    Depression    Emphysema of lung (HCC)    GERD (gastroesophageal reflux disease)    Heart murmur    Hip pain    History of Lyme disease    HLD (hyperlipidemia)    Hyperlipemia    Hypertension    IBS (irritable bowel syndrome)    IBS (irritable bowel syndrome)    Lyme disease    Pre-diabetes    Sciatica    Sciatica    Seizures (HCC)    Tremor    Vitamin D deficiency  Vitamin D deficiency     SURGICAL HISTORY: Past Surgical History:  Procedure Laterality Date   ABDOMINAL HYSTERECTOMY     BREAST BIOPSY Right 07/22/2021   [place] bx 1:00 area, venus marker, IMC tubular features   BREAST BIOPSY Right 07/22/2021   [place] bx 2:00 ribbon marker, fibroadenoma   BREAST LUMPECTOMY Right 08/24/2021   re-excision   BREAST LUMPECTOMY Right  08/12/2021   CAROTID ENDARTERECTOMY Right    CHOLECYSTECTOMY     COLONOSCOPY WITH PROPOFOL N/A 09/13/2018   Procedure: COLONOSCOPY WITH PROPOFOL;  Surgeon: Lollie Sails, MD;  Location[facility] ENDOSCOPY;  Service: Endoscopy;  Laterality: N/A;   ESOPHAGOGASTRODUODENOSCOPY N/A 05/03/2021   Procedure: ESOPHAGOGASTRODUODENOSCOPY (EGD);  Surgeon: Lesly Rubenstein, MD;  Location: [facility] ENDOSCOPY;  Service: Endoscopy;  Laterality: N/A;   paranasal sinusotomy     PART MASTECTOMY,RADIO FREQUENCY LOCALIZER,AXILLARY SENTINEL NODE BIOPSY Right 08/12/2021   Procedure: PART MASTECTOMY,RADIO FREQUENCY LOCALIZER,AXILLARY SENTINEL NODE BIOPSY;  Surgeon: Benjamine Sprague, DO;  Location: [facility] ORS;  Service: General;  Laterality: Right;   RE-EXCISION OF BREAST CANCER,SUPERIOR MARGINS Right 08/24/2021   Procedure: RE-EXCISION OF BREAST CANCER,SUPERIOR MARGINS;  Surgeon: Benjamine Sprague, DO;  Location: [facility] ORS;  Service: General;  Laterality: Right;    SOCIAL HISTORY: Social History   Socioeconomic History   Marital status: Divorced    Spouse name: Not on file   Number of children: Not on file   Years of education: Not on file   Highest education level: Not on file  Occupational History   Not on file  Tobacco Use   Smoking status: Every Day    Packs/day: 0.50    Years: 55.00    Total pack years: 27.50    Types: Cigarettes   Smokeless tobacco: Never  Vaping Use   Vaping Use: Never used  Substance and Sexual Activity   Alcohol use: Yes    Alcohol/week: 0.0 standard drinks of alcohol    Comment: occasional   Drug use: Yes    Types: Marijuana   Sexual activity: Not on file  Other Topics Concern   Not on file  Social History Narrative   [place] ~25-30 mins; lives by self; no children/husband; sister near by. Smoker 1ppd/marijuana. Alcohol- beer/liqqor; retd from sales.    Social Determinants of Health   Financial Resource Strain: Not on file  Food Insecurity: No Food Insecurity  (10/02/2022)   Hunger Vital Sign    Worried About Running Out of Food in the Last Year: Never true    Ran Out of Food in the Last Year: Never true  Transportation Needs: No Transportation Needs (10/02/2022)   PRAPARE - Hydrologist (Medical): No    Lack of Transportation (Non-Medical): No  Physical Activity: Not on file  Stress: Not on file  Social Connections: Not on file  Intimate Partner Violence: Not At Risk (10/02/2022)   Humiliation, Afraid, Rape, and Kick questionnaire    Fear of Current or Ex-Partner: No    Emotionally Abused: No    Physically Abused: No    Sexually Abused: No    FAMILY HISTORY: Family History  Problem Relation Age of Onset   Alcohol abuse Father    Alzheimer's disease Father    Anxiety disorder Father    Depression Father    Alcohol abuse Mother    CAD Mother    Diabetes Mother    Hyperlipidemia Mother    Depression Mother     ALLERGIES:  is allergic to morphine  and related, clindamycin, erythromycin, other, penicillins, promethazine-phenylephrine, and tape.  MEDICATIONS:  No current facility-administered medications for this visit.   Current Outpatient Medications  Medication Sig Dispense Refill   albuterol (VENTOLIN HFA) 108 (90 Base) MCG/ACT inhaler Inhale 1-2 puffs into the lungs every 6 (six) hours as needed for wheezing or shortness of breath.     amLODipine (NORVASC) 5 MG tablet Take 5 mg by mouth daily.     anastrozole (ARIMIDEX) 1 MG tablet TAKE 1 TABLET BY MOUTH EVERY DAY 90 tablet 0   aspirin EC 81 MG tablet Take 81 mg by mouth daily.     atorvastatin (LIPITOR) 80 MG tablet Take 80 mg by mouth daily.     baclofen (LIORESAL) 10 MG tablet Take 5-10 mg by mouth at bedtime as needed for muscle spasms.     calcium carbonate (TUMS EX) 750 MG chewable tablet Chew 1 tablet by mouth daily as needed for heartburn.     Cholecalciferol (VITAMIN D3) 10 MCG (400 UNIT) tablet Take 400 Units by mouth daily.      clobetasol (TEMOVATE) 0.05 % external solution Apply 1 Application topically daily.     dicyclomine (BENTYL) 10 MG capsule Take 10 mg by mouth in the morning and at bedtime.     docusate sodium (COLACE) 100 MG capsule Take 100 mg by mouth 2 (two) times daily.     famotidine (PEPCID) 20 MG tablet Take 20 mg by mouth 2 (two) times daily.     gabapentin (NEURONTIN) 600 MG tablet Take 600 mg by mouth 3 (three) times daily.  5   ketoconazole (NIZORAL) 2 % shampoo Apply 1 application topically once a week.     lisinopril-hydrochlorothiazide (PRINZIDE,ZESTORETIC) 20-12.5 MG tablet Take 1 tablet by mouth in the morning and at bedtime.     Multiple Vitamin (MULTIVITAMIN WITH MINERALS) TABS tablet Take 1 tablet by mouth daily.     venlafaxine XR (EFFEXOR-XR) 75 MG 24 hr capsule Take 75 mg by mouth 3 (three) times daily.     vitamin B-12 (CYANOCOBALAMIN) 1000 MCG tablet Take 1,000 mcg by mouth daily.     Zinc Sulfate (ZINC 15 PO) Take 15 mg by mouth daily.     tiotropium (SPIRIVA) 18 MCG inhalation capsule Place 18 mcg into inhaler and inhale every morning. (Patient not taking: Reported on 10/01/2022)     Facility-Administered Medications Ordered in Other Visits  Medication Dose Route Frequency Provider Last Rate Last Admin   sodium chloride 0.9 % bolus 500 mL  500 mL Intravenous Once Duffy Bruce, MD          .  PHYSICAL EXAMINATION: ECOG PERFORMANCE STATUS: 0 - Asymptomatic  Vitals:   11/06/22 1530 11/06/22 1600  BP: (!) 128/117 (!) 77/50  Pulse: 96 (!) 116  Resp: 19   Temp: (!) 95.2 F (35.1 C)   SpO2: 95%    Filed Weights   11/06/22 1530  Weight: 161 lb 1.6 oz (73.1 kg)    Physical Exam Vitals and nursing note reviewed.  HENT:     Head: Normocephalic and atraumatic.     Mouth/Throat:     Pharynx: Oropharynx is clear.  Eyes:     Extraocular Movements: Extraocular movements intact.     Pupils: Pupils are equal, round, and reactive to light.  Cardiovascular:     Rate and  Rhythm: Normal rate and regular rhythm.     Heart sounds: Murmur heard.  Pulmonary:     Comments: Decreased breath sounds bilaterally.  Abdominal:     Palpations: Abdomen is soft.  Musculoskeletal:        General: Normal range of motion.     Cervical back: Normal range of motion.  Skin:    General: Skin is warm.  Neurological:     General: No focal deficit present.     Mental Status: She is alert and oriented to person, place, and time.  Psychiatric:        Behavior: Behavior normal.        Judgment: Judgment normal.      LABORATORY DATA:  I have reviewed the data as listed Lab Results  Component Value Date   WBC 9.6 11/06/2022   HGB 15.5 (H) 11/06/2022   HCT 47.5 (H) 11/06/2022   MCV 95.2 11/06/2022   PLT 160 11/06/2022   Recent Labs    10/02/22 0630 10/03/22 0656 11/06/22 1515 11/06/22 1638  NA 140 143 138 137  K 3.8 3.8 3.5 3.8  CL 104 107 101 103  CO2 _0 20*  GLUCOSE 96 118* 114* 92  BUN 35* 21 29* 30*  CREATININE 1.03* 0.89 3.34* 3.34*  CALCIUM 8.7* 9.0 8.9 9.2  GFRNONAA 58* >60 14* 14*  PROT 6.5  --  7.4 7.6  ALBUMIN 3.5  --  4.1 4.1  AST 39  --  22 25  ALT 25  --  21 21  ALKPHOS 59  --  74 75  BILITOT 1.4*  --  0.6 0.6    RADIOGRAPHIC STUDIES: I have personally reviewed the radiological images as listed and agreed with the findings in the report. DG Chest Port 1 View  Result Date: 11/06/2022 CLINICAL DATA:  Hypotension and dizziness. EXAM: PORTABLE CHEST 1 VIEW COMPARISON:  Chest x-ray 06/16/2017 FINDINGS: The heart size and mediastinal contours are within normal limits. Both lungs are clear. The visualized skeletal structures are unremarkable. IMPRESSION: No active disease. Electronically Signed   By: Ronney Asters M.D.   On: 11/06/2022 18:05    ASSESSMENT & PLAN:   Carcinoma of upper-outer quadrant of right breast in female, estrogen receptor positive ([place]) #Stage I pT1a grade 1- ER/PR positive; her2 NEU-NEGATIVE.  S/p postlumpectomy  radiation; 11/28 last RT. Currently on Anastrazole [since jan 2023]  # Continue anastrozole as ordered anastrozole; patient started on December 04, 2021.  See discussion below-   # OSTEOPENIA- [FEB 2022]T-score of -2.2.  Continue calcium plus vitamin D.  # Acute renal failure- GFR 12/prerenal versus others-diarrhea poor p.o. intake.  Systolic blood pressure 38G.  Recommend holding hydrochlorothiazide/lisinopril.  Discussed with nephrology.  Recommend urgent evaluation in the emergency room for IV fluids and further workup.  # Dizzy spells likely secondary to hypotension/prerenal diarrhea-further workup in the emergency room.  # DISPOSITION: # follow up in 3 months-  MD;  labs- cbc/cmp-- Dr.B  All questions were answered. The patient/family knows to call the clinic with any problems, questions or concerns.    Cammie Sickle, MD 11/06/2022 6:49 PM

## 2022-11-06 NOTE — ED Triage Notes (Signed)
Pt to ED for hypotension and dizziness. Reports takes meds for HTN, last taken this am.

## 2022-11-07 ENCOUNTER — Encounter: Payer: Self-pay | Admitting: Family Medicine

## 2022-11-07 DIAGNOSIS — N179 Acute kidney failure, unspecified: Secondary | ICD-10-CM | POA: Diagnosis not present

## 2022-11-07 LAB — CBC
HCT: 39.5 % (ref 36.0–46.0)
Hemoglobin: 12.6 g/dL (ref 12.0–15.0)
MCH: 30.6 pg (ref 26.0–34.0)
MCHC: 31.9 g/dL (ref 30.0–36.0)
MCV: 95.9 fL (ref 80.0–100.0)
Platelets: 149 10*3/uL — ABNORMAL LOW (ref 150–400)
RBC: 4.12 MIL/uL (ref 3.87–5.11)
RDW: 14 % (ref 11.5–15.5)
WBC: 5.7 10*3/uL (ref 4.0–10.5)
nRBC: 0 % (ref 0.0–0.2)

## 2022-11-07 LAB — BASIC METABOLIC PANEL
Anion gap: 8 (ref 5–15)
BUN: 25 mg/dL — ABNORMAL HIGH (ref 8–23)
CO2: 23 mmol/L (ref 22–32)
Calcium: 7.8 mg/dL — ABNORMAL LOW (ref 8.9–10.3)
Chloride: 112 mmol/L — ABNORMAL HIGH (ref 98–111)
Creatinine, Ser: 1.72 mg/dL — ABNORMAL HIGH (ref 0.44–1.00)
GFR, Estimated: 31 mL/min — ABNORMAL LOW (ref >60)
Glucose, Bld: 100 mg/dL — ABNORMAL HIGH (ref 70–99)
Potassium: 3 mmol/L — ABNORMAL LOW (ref 3.5–5.1)
Sodium: 143 mmol/L (ref 135–145)

## 2022-11-07 LAB — URINALYSIS, ROUTINE W REFLEX MICROSCOPIC
Bilirubin Urine: NEGATIVE
Glucose, UA: NEGATIVE mg/dL
Hgb urine dipstick: NEGATIVE
Ketones, ur: NEGATIVE mg/dL
Leukocytes,Ua: NEGATIVE
Nitrite: NEGATIVE
Protein, ur: NEGATIVE mg/dL
Specific Gravity, Urine: 1.006 (ref 1.005–1.030)
pH: 5 (ref 5.0–8.0)

## 2022-11-07 LAB — MAGNESIUM: Magnesium: 1.7 mg/dL (ref 1.7–2.4)

## 2022-11-07 LAB — LACTIC ACID, PLASMA: Lactic Acid, Venous: 1.5 mmol/L (ref 0.5–1.9)

## 2022-11-07 MED ORDER — MAGNESIUM SULFATE 2 GM/50ML IV SOLN
2.0000 g | Freq: Once | INTRAVENOUS | Status: AC
  Administered 2022-11-07: 2 g via INTRAVENOUS
  Filled 2022-11-07: qty 50

## 2022-11-07 MED ORDER — POTASSIUM CHLORIDE CRYS ER 20 MEQ PO TBCR
40.0000 meq | EXTENDED_RELEASE_TABLET | ORAL | Status: AC
  Administered 2022-11-07 (×2): 40 meq via ORAL
  Filled 2022-11-07 (×2): qty 2

## 2022-11-07 MED ORDER — LISINOPRIL-HYDROCHLOROTHIAZIDE 20-12.5 MG PO TABS: 1.0000 | ORAL_TABLET | Freq: Two times a day (BID) | ORAL | Status: DC

## 2022-11-07 NOTE — Discharge Summary (Signed)
Physician Discharge Summary   Patient: Barbara Contreras MRN: [id number] DOB: 18-Oct-1951  Admit date:     11/06/2022  Discharge date: 11/07/22  Discharge Physician: Lorella Nimrod   PCP: Valera Castle, MD   Recommendations at discharge:  Please obtain CBC and BMP within next few days Keep holding Zestoretic until renal function normalized Follow-up with primary care provider within next 2 to 3 days.  Discharge Diagnoses: Principal Problem:   AKI (acute kidney injury) ([place]) Active Problems:   Acute gastroenteritis   Dyslipidemia   Asthma, chronic   Essential hypertension   GERD without esophagitis   Hospital Course: Taken from H&P.  Barbara Contreras is a 71 y.o. Caucasian female with medical history significant for asthma, carotid stenosis, coronary artery disease, depression, emphysema, GERD, hypertension, dyslipidemia, seizure disorder, IBS and breast cancer status post lumpectomy and radiation on Arimidex since 12/2021, who presented to the emergency room with acute on of recurrent diarrhea with 4-5 loose bowel movements since last night and associated poor appetite and extreme dizziness.  She has been having associated nausea and vomiting.  She had a left neck CTA that showed significant left-sided occlusion and has been evaluated by vascular surgery for her dizziness.  She had rib fracture after a fall and was recently admitted for altered mental status that was thought to be related to alcohol and baclofen and as well as acute kidney injury.  No fever or chills.  No chest pain or palpitations.  No cough or wheezing or dyspnea.   ED course.  She was found to be hypothermic on arrival with temperature of 95.9, initial blood pressure 128/117 with later dropped to 77/50, heart rate of 116.  Responded well to IV fluid.  Labs pertinent for concern of hemoconcentration, BUN of 30, creatinine 3.34.  Troponin 27, lactic acid 2.1.  Procalcitonin 0.1.  COVID-19 and flu PCR negative. EKG  showed likely ectopic rhythm with a rate of 77 with short PR interval and LVH and lateral T wave inversion in lateral leads.  Abdominal and pelvic CT scan revealed left colonic diverticulosis without diverticulitis, aortic atherosclerosis with no acute findings in the abdomen or pelvis. Patient received 2.5 L of IV bolus with normal saline.  12/5: Seems hemodynamically stable, was placed on 2 L of oxygen during sleep, labs with potassium of 3, BUN of 25 and creatinine improved to 1.72, baseline appears to be <1. UA does not look infected.  Preliminary blood cultures negative in 12-hour  Patient feels much improved with IV fluid.  Apparently just came to get some IV fluid due to softer blood pressure and dizziness.  She was on Zestoretic which is being held until she sees her primary care doctor and have her renal functions repeated before restarted. Potassium and magnesium was repleted before discharge, for hypokalemia and hypomagnesemia.  Most likely secondary to GI losses with vomiting. Patient was told to keep herself well-hydrated.  She was requesting a discharge as she was feeling at baseline.  No more nausea or vomiting.  Able to tolerate diet.  She will continue with rest of her home medications except holding Zestoretic as mentioned above for few days and need to have a close follow-up with her provide for further recommendations.  Assessment and Plan: * AKI (acute kidney injury) ([place]) -This is likely prerenal due to volume depletion and dehydration from acute gastroenteritis. - The patient will be admitted to a medical bed.  Next-we will continue hydration with IV normal saline. - We  will follow her BMP. - We will avoid nephrotoxins.  Dyslipidemia - We will continue statin therapy.  Acute gastroenteritis - This is likely the culprit for #1. - Management as above. - As needed antiemetics and antidiarrheals will be utilized.  Asthma, chronic - We will continue her  inhalers.  Essential hypertension - With improvement of her blood pressure amlodipine will be continued while holding off Zestoretic given acute kidney injury.  GERD without esophagitis - She was placed on PPI therapy.   Consultants: None Procedures performed: None Disposition: Home Diet recommendation:  Discharge Diet Orders (From admission, onward)     Start     Ordered   11/07/22 0000  Diet - low sodium heart healthy        11/07/22 1419           Cardiac diet DISCHARGE MEDICATION: Allergies as of 11/07/2022       Reactions   Morphine And Related Itching   Clindamycin Other (See Comments)   Erythromycin    Other reaction(s): Other (see comments)   Other    Other reaction(s): Other (See Comments) Redness and itching   Penicillins Other (See Comments)   TOLERATED CEFAZOLIN PRIOR Reaction: unknown Has patient had a PCN reaction causing immediate rash, facial/tongue/throat swelling, SOB or lightheadedness with hypotension: Unknown Has patient had a PCN reaction causing severe rash involving mucus membranes or skin necrosis: Unknown Has patient had a PCN reaction that required hospitalization: Unknown Has patient had a PCN reaction occurring within the last 10 years: no If all of the above answers are "NO", then may proceed with Cephalosporin use.   Promethazine-phenylephrine Other (See Comments)   Muscle spasm   Tape Other (See Comments)   Redness and itching        Medication List     STOP taking these medications    tiotropium 18 MCG inhalation capsule Commonly known as: SPIRIVA       TAKE these medications    albuterol 108 (90 Base) MCG/ACT inhaler Commonly known as: VENTOLIN HFA Inhale 1-2 puffs into the lungs every 6 (six) hours as needed for wheezing or shortness of breath.   amLODipine 5 MG tablet Commonly known as: NORVASC Take 5 mg by mouth daily.   anastrozole 1 MG tablet Commonly known as: ARIMIDEX TAKE 1 TABLET BY MOUTH EVERY DAY    aspirin EC 81 MG tablet Take 81 mg by mouth daily.   atorvastatin 80 MG tablet Commonly known as: LIPITOR Take 80 mg by mouth daily.   baclofen 10 MG tablet Commonly known as: LIORESAL Take 5-10 mg by mouth at bedtime as needed for muscle spasms.   calcium carbonate 750 MG chewable tablet Commonly known as: TUMS EX Chew 1 tablet by mouth daily as needed for heartburn.   clobetasol 0.05 % external solution Commonly known as: TEMOVATE Apply 1 Application topically daily.   cyanocobalamin 1000 MCG tablet Commonly known as: VITAMIN B12 Take 1,000 mcg by mouth daily.   dicyclomine 10 MG capsule Commonly known as: BENTYL Take 10 mg by mouth in the morning and at bedtime.   docusate sodium 100 MG capsule Commonly known as: COLACE Take 100 mg by mouth 2 (two) times daily.   famotidine 20 MG tablet Commonly known as: PEPCID Take 20 mg by mouth 2 (two) times daily.   gabapentin 600 MG tablet Commonly known as: NEURONTIN Take 600 mg by mouth 3 (three) times daily.   ketoconazole 2 % shampoo Commonly known as: NIZORAL Apply 1  application topically once a week.   lisinopril-hydrochlorothiazide 20-12.5 MG tablet Commonly known as: ZESTORETIC Take 1 tablet by mouth in the morning and at bedtime. Hold until you see your doctor What changed: additional instructions   multivitamin with minerals Tabs tablet Take 1 tablet by mouth daily.   venlafaxine XR 75 MG 24 hr capsule Commonly known as: EFFEXOR-XR Take 75 mg by mouth 3 (three) times daily.   Vitamin D3 10 MCG (400 UNIT) tablet Take 400 Units by mouth daily.   ZINC 15 PO Take 15 mg by mouth daily.        Discharge Exam: Filed Weights   11/06/22 1634  Weight: 73 kg   General.  Well-developed lady, in no acute distress. Pulmonary.  Lungs clear bilaterally, normal respiratory effort. CV.  Regular rate and rhythm, no JVD, rub or murmur. Abdomen.  Soft, nontender, nondistended, BS positive. CNS.  Alert and  oriented .  No focal neurologic deficit. Extremities.  No edema, no cyanosis, pulses intact and symmetrical. Psychiatry.  Judgment and insight appears normal.   Condition at discharge: stable  The results of significant diagnostics from this hospitalization (including imaging, microbiology, ancillary and laboratory) are listed below for reference.   Imaging Studies: CT ABDOMEN PELVIS WO CONTRAST  Result Date: 11/06/2022 CLINICAL DATA:  Abdominal pain EXAM: CT ABDOMEN AND PELVIS WITHOUT CONTRAST TECHNIQUE: Multidetector CT imaging of the abdomen and pelvis was performed following the standard protocol without IV contrast. RADIATION DOSE REDUCTION: This exam was performed according to the departmental dose-optimization program which includes automated exposure control, adjustment of the mA and/or kV according to patient size and/or use of iterative reconstruction technique. COMPARISON:  09/01/2022 FINDINGS: Lower chest: Scarring in the right middle lobe at the lung base. No acute abnormality. Aortic atherosclerosis. Hepatobiliary: No focal liver abnormality is seen. Status post cholecystectomy. No biliary dilatation. Pancreas: No focal abnormality or ductal dilatation. Spleen: No focal abnormality.  Normal size. Adrenals/Urinary Tract: Low-density left adrenal nodule measures 1.7 cm with diffuse enlargement of the left adrenal gland. Findings most compatible with adenoma and/or hyperplasia. Mild fullness of the right adrenal gland compatible with hyperplasia. No renal or ureteral stones. No hydronephrosis. Urinary bladder unremarkable. Stomach/Bowel: Normal appendix. Left colonic diverticulosis. No active diverticulitis. Stomach and small bowel decompressed, unremarkable. Vascular/Lymphatic: Aortic atherosclerosis. Reproductive: Prior hysterectomy.  No adnexal masses. Other: No free fluid or free air. Musculoskeletal: No acute bony abnormality. IMPRESSION: No acute findings in the abdomen or pelvis. Left  colonic diverticulosis.  No active diverticulitis. Aortic atherosclerosis. Electronically Signed   By: Rolm Baptise M.D.   On: 11/06/2022 19:25   DG Chest Port 1 View  Result Date: 11/06/2022 CLINICAL DATA:  Hypotension and dizziness. EXAM: PORTABLE CHEST 1 VIEW COMPARISON:  Chest x-ray 06/16/2017 FINDINGS: The heart size and mediastinal contours are within normal limits. Both lungs are clear. The visualized skeletal structures are unremarkable. IMPRESSION: No active disease. Electronically Signed   By: Ronney Asters M.D.   On: 11/06/2022 18:05    Microbiology: Results for orders placed or performed during the hospital encounter of 11/06/22  Resp Panel by RT-PCR (Flu A&B, Covid) Anterior Nasal Swab     Status: None   Collection Time: 11/06/22  5:03 PM   Specimen: Anterior Nasal Swab  Result Value Ref Range Status   SARS Coronavirus 2 by RT PCR NEGATIVE NEGATIVE Final    Comment: (NOTE) SARS-CoV-2 target nucleic acids are NOT DETECTED.  The SARS-CoV-2 RNA is generally detectable in upper respiratory specimens during  the acute phase of infection. The lowest concentration of SARS-CoV-2 viral copies this assay can detect is 138 copies/mL. A negative result does not preclude SARS-Cov-2 infection and should not be used as the sole basis for treatment or other patient management decisions. A negative result may occur with  improper specimen collection/handling, submission of specimen other than nasopharyngeal swab, presence of viral mutation(s) within the areas targeted by this assay, and inadequate number of viral copies(<138 copies/mL). A negative result must be combined with clinical observations, patient history, and epidemiological information. The expected result is Negative.  Fact Sheet for Patients:  EntrepreneurPulse.com.au  Fact Sheet for Healthcare Providers:  IncredibleEmployment.be  This test is no t yet approved or cleared by the [place] FDA and  has been authorized for detection and/or diagnosis of SARS-CoV-2 by FDA under an Emergency Use Authorization (EUA). This EUA will remain  in effect (meaning this test can be used) for the duration of the COVID-19 declaration under Section 564(b)(1) of the Act, 21 U.S.C.section 360bbb-3(b)(1), unless the authorization is terminated  or revoked sooner.       Influenza A by PCR NEGATIVE NEGATIVE Final   Influenza B by PCR NEGATIVE NEGATIVE Final    Comment: (NOTE) The Xpert Xpress SARS-CoV-2/FLU/RSV plus assay is intended as an aid in the diagnosis of influenza from Nasopharyngeal swab specimens and should not be used as a sole basis for treatment. Nasal washings and aspirates are unacceptable for Xpert Xpress SARS-CoV-2/FLU/RSV testing.  Fact Sheet for Patients: EntrepreneurPulse.com.au  Fact Sheet for Healthcare Providers: IncredibleEmployment.be  This test is not yet approved or cleared by the [place] FDA and has been authorized for detection and/or diagnosis of SARS-CoV-2 by FDA under an Emergency Use Authorization (EUA). This EUA will remain in effect (meaning this test can be used) for the duration of the COVID-19 declaration under Section 564(b)(1) of the Act, 21 U.S.C. section 360bbb-3(b)(1), unless the authorization is terminated or revoked.  Performed at [facility], [address] [place], [place] [address]   Culture, blood (routine x 2)     Status: None (Preliminary result)   Collection Time: 11/06/22  5:19 PM   Specimen: BLOOD  Result Value Ref Range Status   Specimen Description BLOOD BLOOD LEFT HAND  Final   Special Requests   Final    BOTTLES DRAWN AEROBIC ONLY Blood Culture adequate volume   Culture   Final    NO GROWTH < 12 HOURS Performed at [facility], [address], [place], [place] [address]    Report Status PENDING  Incomplete  Culture, blood (routine x 2)     Status:  None (Preliminary result)   Collection Time: 11/06/22  5:19 PM   Specimen: BLOOD  Result Value Ref Range Status   Specimen Description BLOOD BLOOD LEFT ARM  Final   Special Requests   Final    BOTTLES DRAWN AEROBIC AND ANAEROBIC Blood Culture adequate volume   Culture   Final    NO GROWTH < 12 HOURS Performed at [facility], [address] [place], [place] [address]    Report Status PENDING  Incomplete    Labs: CBC: Recent Labs  Lab 11/06/22 1515 11/06/22 1638 11/07/22 0532  WBC 9.2 9.6 5.7  NEUTROABS 7.2  --   --   HGB 15.5* 15.5* 12.6  HCT 47.0* 47.5* 39.5  MCV 94.0 95.2 95.9  PLT 183 160 924*   Basic Metabolic Panel: Recent Labs  Lab 11/06/22 1515 11/06/22  1638 11/07/22 0532  NA 138 137 143  K 3.5 3.8 3.0*  CL 101 103 112*  CO2 23 20* 23  GLUCOSE 114* 92 100*  BUN 29* 30* 25*  CREATININE 3.34* 3.34* 1.72*  CALCIUM 8.9 9.2 7.8*  MG  --   --  1.7   Liver Function Tests: Recent Labs  Lab 11/06/22 1515 11/06/22 1638  AST 22 25  ALT 21 21  ALKPHOS 74 75  BILITOT 0.6 0.6  PROT 7.4 7.6  ALBUMIN 4.1 4.1   CBG: No results for input(s): "GLUCAP" in the last 168 hours.  Discharge time spent: greater than 30 minutes.  This record has been created using Systems analyst. Errors have been sought and corrected,but may not always be located. Such creation errors do not reflect on the standard of care.   Signed: Lorella Nimrod, MD [facility] 11/07/2022

## 2022-11-07 NOTE — TOC Progression Note (Signed)
Transition of Care [facility]) - Progression Note    Patient Details  Name: Barbara Contreras MRN: [id number] Date of Birth: [birth year]  Transition of Care [facility]) CM/SW [place], RN Phone Number: 11/07/2022, 2:51 PM  Clinical Narrative:     Reviewed the code 68 with the patient verbally, she stated understanding She stated she has no needs  Expected Discharge Plan: Home/Self Care Barriers to Discharge: Barriers Resolved  Expected Discharge Plan and Services Expected Discharge Plan: Home/Self Care   Discharge Planning Services: CM Consult   Living arrangements for the past 2 months: Single Family Home Expected Discharge Date: 11/07/22               DME Arranged: N/A DME Agency: NA                   Social Determinants of Health (SDOH) Interventions    Readmission Risk Interventions     No data to display

## 2022-11-07 NOTE — ED Notes (Signed)
Pt placed on oxygen 2 liters via nasal canula while sleeping

## 2022-11-07 NOTE — Care Management CC44 (Signed)
Condition Code 44 Documentation Completed  Patient Details  Name: Barbara Contreras MRN: [id number] Date of Birth: [birth year]   Condition Code 44 given:  Yes Patient signature on Condition Code 44 notice:  Yes Documentation of 2 MD's agreement:  Yes Code 44 added to claim:  Yes    Conception Oms, RN 11/07/2022, 2:48 PM

## 2022-11-07 NOTE — Hospital Course (Addendum)
Taken from H&P.  Barbara Contreras is a 71 y.o. Caucasian female with medical history significant for asthma, carotid stenosis, coronary artery disease, depression, emphysema, GERD, hypertension, dyslipidemia, seizure disorder, IBS and breast cancer status post lumpectomy and radiation on Arimidex since 12/2021, who presented to the emergency room with acute on of recurrent diarrhea with 4-5 loose bowel movements since last night and associated poor appetite and extreme dizziness.  She has been having associated nausea and vomiting.  She had a left neck CTA that showed significant left-sided occlusion and has been evaluated by vascular surgery for her dizziness.  She had rib fracture after a fall and was recently admitted for altered mental status that was thought to be related to alcohol and baclofen and as well as acute kidney injury.  No fever or chills.  No chest pain or palpitations.  No cough or wheezing or dyspnea.   ED course.  She was found to be hypothermic on arrival with temperature of 95.9, initial blood pressure 128/117 with later dropped to 77/50, heart rate of 116.  Responded well to IV fluid.  Labs pertinent for concern of hemoconcentration, BUN of 30, creatinine 3.34.  Troponin 27, lactic acid 2.1.  Procalcitonin 0.1.  COVID-19 and flu PCR negative. EKG showed likely ectopic rhythm with a rate of 77 with short PR interval and LVH and lateral T wave inversion in lateral leads.  Abdominal and pelvic CT scan revealed left colonic diverticulosis without diverticulitis, aortic atherosclerosis with no acute findings in the abdomen or pelvis. Patient received 2.5 L of IV bolus with normal saline.  12/5: Seems hemodynamically stable, was placed on 2 L of oxygen during sleep, labs with potassium of 3, BUN of 25 and creatinine improved to 1.72, baseline appears to be <1. UA does not look infected.  Preliminary blood cultures negative in 12-hour  Patient feels much improved with IV fluid.   Apparently just came to get some IV fluid due to softer blood pressure and dizziness.  She was on Zestoretic which is being held until she sees her primary care doctor and have her renal functions repeated before restarted. Potassium and magnesium was repleted before discharge, for hypokalemia and hypomagnesemia.  Most likely secondary to GI losses with vomiting. Patient was told to keep herself well-hydrated.  She was requesting a discharge as she was feeling at baseline.  No more nausea or vomiting.  Able to tolerate diet.  She will continue with rest of her home medications except holding Zestoretic as mentioned above for few days and need to have a close follow-up with her provide for further recommendations.

## 2022-11-07 NOTE — TOC Initial Note (Signed)
Transition of Care [facility]) - Initial/Assessment Note    Patient Details  Name: Barbara Contreras MRN: [id number] Date of Birth: Nov 04, 1951  Transition of Care [facility]) CM/SW Contact:    Conception Oms, RN Phone Number: 11/07/2022, 2:37 PM  Clinical Narrative:                   Transition of Care (TOC) Screening Note   Patient Details  Name: Barbara Contreras Date of Birth: [birth year]   Transition of Care [facility]) CM/SW Contact:    Conception Oms, RN Phone Number: 11/07/2022, 2:37 PM  Patient lives alone, reports she is feeling at baseline Has a PCP and pharmacy in place  Transition of Care Department [facility]) has reviewed patient and no TOC needs have been identified at this time. We will continue to monitor patient advancement through interdisciplinary progression rounds. If new patient transition needs arise, please place a TOC consult.   Expected Discharge Plan: Home/Self Care Barriers to Discharge: Barriers Resolved   Patient Goals and CMS Choice        Expected Discharge Plan and Services Expected Discharge Plan: Home/Self Care   Discharge Planning Services: CM Consult   Living arrangements for the past 2 months: Single Family Home Expected Discharge Date: 11/07/22               DME Arranged: N/A DME Agency: NA                  Prior Living Arrangements/Services Living arrangements for the past 2 months: Single Family Home Lives with:: Self   Do you feel safe going back to the place where you live?: Yes          Current home services: DME (rollator, cane, grab bars, shower seat)    Activities of Daily Living Home Assistive Devices/Equipment: Eyeglasses ADL Screening (condition at time of admission) Patient's cognitive ability adequate to safely complete daily activities?: Yes Is the patient deaf or have difficulty hearing?: No Does the patient have difficulty seeing, even when wearing glasses/contacts?: No Does the patient have difficulty  concentrating, remembering, or making decisions?: No Patient able to express need for assistance with ADLs?: Yes Does the patient have difficulty dressing or bathing?: No Independently performs ADLs?: Yes (appropriate for developmental age) Does the patient have difficulty walking or climbing stairs?: No Weakness of Legs: None Weakness of Arms/Hands: None  Permission Sought/Granted                  Emotional Assessment              Admission diagnosis:  Dehydration [E86.0] AKI (acute kidney injury) ([place]) [N17.9] Patient Active Problem List   Diagnosis Date Noted   Acute gastroenteritis 11/06/2022   Dyslipidemia 11/06/2022   Essential hypertension 11/06/2022   GERD without esophagitis 11/06/2022   Asthma, chronic 11/06/2022   Alcohol abuse 10/02/2022   AKI (acute kidney injury) ([place]) 10/02/2022   Closed fracture of rib of right side 10/02/2022   Closed L1 vertebral fracture ([place]) 10/02/2022   Closed L2 vertebral fracture ([place]) 10/02/2022   Fall 10/02/2022   Electrolyte abnormality 10/01/2022   Osteopenia 08/30/2022   Carcinoma of upper-outer quadrant of right breast in female, estrogen receptor positive ([place]) 08/03/2021   Carotid stenosis 08/13/2018   Tobacco use disorder 08/13/2018   Elevated troponin 06/16/2017   Syncope and collapse 03/09/2016   HTN (hypertension) 03/09/2016   HLD (hyperlipidemia) 03/09/2016   Depression 03/09/2016   Heart murmur 03/09/2016  PCP:  Valera Castle, MD Pharmacy:   [id number] [place], [id number]: [phone]: [phone] [id number] [id number]: [phone]: [phone]   Social Determinants of Health (SDOH) Interventions    Readmission Risk Interventions     No data to display

## 2022-11-11 LAB — CULTURE, BLOOD (ROUTINE X 2)
Culture: NO GROWTH
Culture: NO GROWTH
Special Requests: ADEQUATE
Special Requests: ADEQUATE

## 2022-12-15 ENCOUNTER — Encounter (INDEPENDENT_AMBULATORY_CARE_PROVIDER_SITE_OTHER): Payer: Self-pay

## 2022-12-15 ENCOUNTER — Encounter (INDEPENDENT_AMBULATORY_CARE_PROVIDER_SITE_OTHER): Payer: Medicare Other | Admitting: Vascular Surgery

## 2023-01-09 ENCOUNTER — Encounter (INDEPENDENT_AMBULATORY_CARE_PROVIDER_SITE_OTHER): Payer: Self-pay | Admitting: Vascular Surgery

## 2023-01-09 ENCOUNTER — Ambulatory Visit (INDEPENDENT_AMBULATORY_CARE_PROVIDER_SITE_OTHER): Payer: 59 | Admitting: Vascular Surgery

## 2023-01-09 VITALS — BP 136/72 | HR 79 | Ht 65.0 in | Wt 161.0 lb

## 2023-01-09 DIAGNOSIS — E785 Hyperlipidemia, unspecified: Secondary | ICD-10-CM

## 2023-01-09 DIAGNOSIS — F17200 Nicotine dependence, unspecified, uncomplicated: Secondary | ICD-10-CM | POA: Diagnosis not present

## 2023-01-09 DIAGNOSIS — I10 Essential (primary) hypertension: Secondary | ICD-10-CM | POA: Diagnosis not present

## 2023-01-09 DIAGNOSIS — I6523 Occlusion and stenosis of bilateral carotid arteries: Secondary | ICD-10-CM | POA: Diagnosis not present

## 2023-01-09 MED ORDER — CLOPIDOGREL BISULFATE 75 MG PO TABS: 75.0000 mg | ORAL_TABLET | Freq: Every day | ORAL | 6 refills | Status: DC

## 2023-01-09 NOTE — Assessment & Plan Note (Signed)
Represents a significant atherosclerotic risk factor and smoking cessation would be of benefit for her vascular disease.

## 2023-01-09 NOTE — Patient Instructions (Signed)
Carotid Angioplasty With Stent Carotid angioplasty with stent is a procedure to open or widen an artery in the neck (carotid artery) that has become narrowed. This is done by inflating a small balloon inside the artery and then placing a small piece of metal that looks like a coil or spring (stent) inside the artery. The stent helps keep the artery open by supporting the artery walls. The carotid arteries supply blood to the brain. When fats, cholesterol, and other materials (plaque) build up in an artery, the artery becomes narrow and can become blocked. This can reduce or block blood flow to certain areas of the brain, which can cause serious health problems, including stroke. The stent helps to keep the artery open so that blood can flow to the brain. Tell a health care provider about: Any allergies you have. All medicines you are taking, including vitamins, herbs, eye drops, creams, and over-the-counter medicines. Any problems you or family members have had with anesthesia. Any bleeding problems you have. Any surgeries you have had. Any medical conditions you have. Whether you are pregnant or may be pregnant. What are the risks? Your health care provider will talk with you about risks. These may include: Stroke. The stent becoming blocked. Problems at the access site, such as a large amount of blood collecting under your skin (hematoma). Allergic reactions to medicines or dyes. Damage to other structures or organs, or to the carotid artery. Infection. Heart attack. Death. This is rare. What happens before the procedure? Follow instructions from your health care provider about what you may eat and drink. Ask your health care provider about: Changing or stopping your regular medicines. These include any diabetes medicines or blood thinners (anticoagulants) you take. Whether aspirin or other blood thinners are recommended before this procedure. Taking over-the-counter medicines, vitamins,  herbs, and supplements. Do not use any products that contain nicotine or tobacco for at least 4 weeks before the procedure. These products include cigarettes, chewing tobacco, and vaping devices, such as e-cigarettes. If you need help quitting, ask your health care provider. Ask your health care provider: How your surgery site will be marked. What steps will be taken to help prevent infection. These steps may include washing skin with a soap that kills germs. You may have blood tests and imaging tests. What happens during the procedure?  An IV will be inserted into one of your veins. You may be given: A sedative. This helps you relax. Anesthesia. This will: Numb certain areas of your body. Make you fall asleep for surgery. Most commonly, an incision will be made in your groin. In some cases, an incision may be made in your wrist or forearm instead of your groin. A small, thin tube (catheter) will be inserted through an incision and into an artery. The catheter will be threaded upward into your carotid artery. An X-ray machine will help your health care provider guide the catheter to the correct place in your artery. Dye will be injected into the catheter and will travel to the narrow or blocked part of your carotid artery. X-ray images will be taken of how the dye flows through your artery. While the images are being taken, you may be given instructions about breathing, swallowing, moving, or talking. A filter will be inserted into your artery. This will be used to catch plaque that comes loose in your artery during the procedure. This reduces the risk of plaque moving into your brain. A small balloon will be inserted into your artery.  The balloon will be inflated for a few seconds to widen your artery and will then be removed. The stent will be placed in your artery. A second small balloon will be inserted into your artery and inflated. This expands the stent inside of your artery so that the  stent holds the artery walls open. The second balloon will then be removed. The catheter, filter, and first balloon will be removed from your artery and pressure will be held on your carotid artery to stop bleeding. Your incision may be closed with stitches (sutures), skin glue, or adhesive strips. A bandage (dressing) will be placed over your incision. The procedure may vary among health care providers and hospitals. What happens after the procedure? Your blood pressure, heart rate, breathing rate, and blood oxygen level will be monitored until you leave the hospital or clinic. Your mental status and movements (neurological status) will be monitored. You may need to have pressure placed on the incision site to prevent bleeding. You will need to keep the area still for a few hours, or as long as told by your health care provider. If the procedure was done in your groin, you will be told not to bend or cross your legs. Most people stay in the hospital overnight. This information is not intended to replace advice given to you by your health care provider. Make sure you discuss any questions you have with your health care provider. Document Revised: 04/18/2022 Document Reviewed: 04/18/2022 Elsevier Patient Education  [place].

## 2023-01-09 NOTE — Assessment & Plan Note (Signed)
The patient reports a previous history of carotid stenosis and she is about 10 to 15 years status post right carotid endarterectomy.  This was checked with an ultrasound which had shown significant velocity increase in her left carotid artery.  This was followed by CT angiogram which I have independently reviewed.  The reports from the duplex were close 70 to 99% left ICA stenosis.  The CT scan was interpreted as a 75% left ICA stenosis but it was noted to be somewhat calcific and that this degree of stenosis may be an underestimate.  I believe it is greater than 75% on the left.  It was reported as mild recurrent narrowing in the right carotid artery although I would estimate this is approaching 50% as well.  Had a long discussion today with the patient regarding the risks and benefits of carotid endarterectomy versus carotid artery stenting.  She had a very difficult time with her carotid endarterectomy previously and will be very desirous of a carotid stent which I think is reasonable given her anatomy.  I have discussed the risks and benefits of both carotid arterectomy and carotid stenting.  I have discussed the differences between the 2 procedure and the expected course.  She voices her understanding and would like to proceed with left carotid stent placement in the near future.

## 2023-01-09 NOTE — Assessment & Plan Note (Signed)
blood pressure control important in reducing the progression of atherosclerotic disease. On appropriate oral medications.  

## 2023-01-09 NOTE — Progress Notes (Signed)
  Patient ID: Barbara Contreras, female   DOB: 03/23/1951, 71 y.o.   MRN: [id number]  Chief Complaint  Patient presents with   New Patient (Initial Visit)    Specialty    HPI Barbara Contreras is a 71 y.o. female.  I am asked to see the patient by Dr. Kowalski for evaluation of carotid stenosis.  The patient reports a previous history of carotid stenosis and she is about 10 to 15 years status post right carotid endarterectomy.  This was checked with an ultrasound which had shown significant velocity increase in her left carotid artery.  This was followed by CT angiogram which I have independently reviewed.  The reports from the duplex were close 70 to 99% left ICA stenosis.  The CT scan was interpreted as a 75% left ICA stenosis but it was noted to be somewhat calcific and that this degree of stenosis may be an underestimate.  I believe it is greater than 75% on the left.  It was reported as mild recurrent narrowing in the right carotid artery although I would estimate this is approaching 50% as well.  No focal neurologic symptoms. Specifically, the patient denies amaurosis fugax, speech or swallowing difficulties, or arm or leg weakness or numbness    Past Medical History:  Diagnosis Date   Arthritis    Asthma    Back pain    Breast cancer (HCC) 2022   Carotid artery stenosis    Carotid stenosis    Coronary artery disease    Depression    Emphysema of lung (HCC)    GERD (gastroesophageal reflux disease)    Heart murmur    Hip pain    History of Lyme disease    HLD (hyperlipidemia)    Hyperlipemia    Hypertension    IBS (irritable bowel syndrome)    IBS (irritable bowel syndrome)    Lyme disease    Pre-diabetes    Sciatica    Sciatica    Seizures (HCC)    Tremor    Vitamin D deficiency    Vitamin D deficiency     Past Surgical History:  Procedure Laterality Date   ABDOMINAL HYSTERECTOMY     BREAST BIOPSY Right 07/22/2021   us bx 1:00 area, venus marker, IMC tubular  features   BREAST BIOPSY Right 07/22/2021   us bx 2:00 ribbon marker, fibroadenoma   BREAST LUMPECTOMY Right 08/24/2021   re-excision   BREAST LUMPECTOMY Right 08/12/2021   CAROTID ENDARTERECTOMY Right    CHOLECYSTECTOMY     COLONOSCOPY WITH PROPOFOL N/A 09/13/2018   Procedure: COLONOSCOPY WITH PROPOFOL;  Surgeon: Skulskie, Martin U, MD;  Location: [facility];  Service: Endoscopy;  Laterality: N/A;   ESOPHAGOGASTRODUODENOSCOPY N/A 05/03/2021   Procedure: ESOPHAGOGASTRODUODENOSCOPY (EGD);  Surgeon: Locklear, Cameron T, MD;  Location: [facility];  Service: Endoscopy;  Laterality: N/A;   paranasal sinusotomy     PART MASTECTOMY,RADIO FREQUENCY LOCALIZER,AXILLARY SENTINEL NODE BIOPSY Right 08/12/2021   Procedure: PART MASTECTOMY,RADIO FREQUENCY LOCALIZER,AXILLARY SENTINEL NODE BIOPSY;  Surgeon: Sakai, Isami, DO;  Location: [facility];  Service: General;  Laterality: Right;   RE-EXCISION OF BREAST CANCER,SUPERIOR MARGINS Right 08/24/2021   Procedure: RE-EXCISION OF BREAST CANCER,SUPERIOR MARGINS;  Surgeon: Sakai, Isami, DO;  Location: [facility];  Service: General;  Laterality: Right;    Family History  Problem Relation Age of Onset   Alcohol abuse Father    Alzheimer's disease Father    Anxiety disorder Father    Depression Father      Alcohol abuse Mother    CAD Mother    Diabetes Mother    Hyperlipidemia Mother    Depression Mother       Social History   Tobacco Use   Smoking status: Every Day    Packs/day: 0.50    Years: 55.00    Total pack years: 27.50    Types: Cigarettes   Smokeless tobacco: Never  Vaping Use   Vaping Use: Never used  Substance Use Topics   Alcohol use: Yes    Alcohol/week: 0.0 standard drinks of alcohol    Comment: occasional   Drug use: Yes    Types: Marijuana     Allergies  Allergen Reactions   Morphine And Related Itching   Clindamycin Other (See Comments)   Erythromycin     Other reaction(s): Other (see comments)   Other     Other  reaction(s): Other (See Comments) Redness and itching   Penicillins Other (See Comments)    TOLERATED CEFAZOLIN PRIOR Reaction: unknown Has patient had a PCN reaction causing immediate rash, facial/tongue/throat swelling, SOB or lightheadedness with hypotension: Unknown Has patient had a PCN reaction causing severe rash involving mucus membranes or skin necrosis: Unknown Has patient had a PCN reaction that required hospitalization: Unknown Has patient had a PCN reaction occurring within the last 10 years: no If all of the above answers are "NO", then may proceed with Cephalosporin use.   Promethazine-Phenylephrine Other (See Comments)    Muscle spasm   Tape Other (See Comments)    Redness and itching    Current Outpatient Medications  Medication Sig Dispense Refill   albuterol (VENTOLIN HFA) 108 (90 Base) MCG/ACT inhaler Inhale 1-2 puffs into the lungs every 6 (six) hours as needed for wheezing or shortness of breath.     amLODipine (NORVASC) 5 MG tablet Take 5 mg by mouth daily.     anastrozole (ARIMIDEX) 1 MG tablet TAKE 1 TABLET BY MOUTH EVERY DAY 90 tablet 0   aspirin EC 81 MG tablet Take 81 mg by mouth daily.     atorvastatin (LIPITOR) 80 MG tablet Take 80 mg by mouth daily.     baclofen (LIORESAL) 10 MG tablet Take 5-10 mg by mouth at bedtime as needed for muscle spasms.     calcium carbonate (TUMS EX) 750 MG chewable tablet Chew 1 tablet by mouth daily as needed for heartburn.     Cholecalciferol (VITAMIN D3) 10 MCG (400 UNIT) tablet Take 400 Units by mouth daily.     clobetasol (TEMOVATE) 0.05 % external solution Apply 1 Application topically daily.     clopidogrel (PLAVIX) 75 MG tablet Take 1 tablet (75 mg total) by mouth daily. 30 tablet 6   dicyclomine (BENTYL) 10 MG capsule Take 10 mg by mouth in the morning and at bedtime.     docusate sodium (COLACE) 100 MG capsule Take 100 mg by mouth 2 (two) times daily.     famotidine (PEPCID) 20 MG tablet Take 20 mg by mouth 2 (two)  times daily.     gabapentin (NEURONTIN) 600 MG tablet Take 600 mg by mouth 3 (three) times daily.  5   ketoconazole (NIZORAL) 2 % shampoo Apply 1 application topically once a week.     lisinopril-hydrochlorothiazide (ZESTORETIC) 20-12.5 MG tablet Take 1 tablet by mouth in the morning and at bedtime. Hold until you see your doctor     Multiple Vitamin (MULTIVITAMIN WITH MINERALS) TABS tablet Take 1 tablet by mouth daily.       venlafaxine XR (EFFEXOR-XR) 75 MG 24 hr capsule Take 75 mg by mouth 3 (three) times daily.     vitamin B-12 (CYANOCOBALAMIN) 1000 MCG tablet Take 1,000 mcg by mouth daily.     Zinc Sulfate (ZINC 15 PO) Take 15 mg by mouth daily.     No current facility-administered medications for this visit.      REVIEW OF SYSTEMS (Negative unless checked)  Constitutional: []Weight loss  []Fever  []Chills Cardiac: []Chest pain   []Chest pressure   []Palpitations   []Shortness of breath when laying flat   []Shortness of breath at rest   [x]Shortness of breath with exertion. 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   []Asthma Neurologic:  []Dizziness  []Blackouts   [x]Seizures   []History of stroke   []History of TIA  []Aphasia   []Temporary blindness   []Dysphagia   []Weakness or numbness in arms   []Weakness or numbness in legs Musculoskeletal:  [x]Arthritis   []Joint swelling   []Joint pain   [x]Low back pain Hematologic:  []Easy bruising  []Easy bleeding   []Hypercoagulable state   []Anemic  []Hepatitis Gastrointestinal:  []Blood in stool   []Vomiting blood  [x]Gastroesophageal reflux/heartburn   []Abdominal pain Genitourinary:  []Chronic kidney  disease   []Difficult urination  []Frequent urination  []Burning with urination   []Hematuria Skin:  []Rashes   []Ulcers   []Wounds Psychological:  []History of anxiety   [] History of major depression.    Physical Exam BP 136/72   Pulse 79   Ht 5' 5" (1.651 m)   Wt 161 lb (73 kg)   BMI 26.79 kg/m  Gen:  WD/WN, NAD Head: [place]/AT, No temporalis wasting.  Ear/Nose/Throat: Hearing grossly intact, nares w/o erythema or drainage, oropharynx w/o Erythema/Exudate Eyes: Conjunctiva clear, sclera non-icteric  Neck: trachea midline.  Pulmonary:  Good air movement, clear to auscultation bilaterally.  Cardiac: RRR, no JVD Vascular:  Vessel Right Left  Radial Palpable Palpable           Musculoskeletal: M/S 5/5 throughout.  Extremities without ischemic changes.  No deformity or atrophy. No edema. Neurologic: Sensation grossly intact in extremities.  Symmetrical.  Speech is fluent. Motor exam as listed above. Psychiatric: Judgment intact, Mood & affect appropriate for pt's clinical situation. Dermatologic: No rashes or ulcers noted.  No cellulitis or open wounds. Lymph : No Cervical, Axillary, or Inguinal lymphadenopathy.   Radiology No results found.  Labs Recent Results (from the past 2160 hour(s))  Comprehensive metabolic panel     Status: Abnormal   Collection Time: 11/06/22  3:15 PM  Result Value Ref Range   Sodium 138 135 - 145 mmol/L   Potassium 3.5 3.5 - 5.1 mmol/L   Chloride 101 98 - 111 mmol/L   CO2 23 22 - 32 mmol/L   Glucose, Bld 114 (H) 70 - 99 mg/dL    Comment: Glucose reference range applies only to samples taken after fasting for at least 8 hours.   BUN 29 (H) 8 - 23 mg/dL   Creatinine, Ser 3.34 (  H) 0.44 - 1.00 mg/dL   Calcium 8.9 8.9 - 10.3 mg/dL   Total Protein 7.4 6.5 - 8.1 g/dL   Albumin 4.1 3.5 - 5.0 g/dL   AST 22 15 - 41 U/L   ALT 21 0 - 44 U/L   Alkaline Phosphatase 74 38 - 126 U/L   Total Bilirubin 0.6 0.3 - 1.2 mg/dL   GFR, Estimated 14 (L) >60 mL/min     Comment: (NOTE) Calculated using the CKD-EPI Creatinine Equation (2021)    Anion gap 14 5 - 15    Comment: Performed at [facility], [address] [place], [place] [address]  CBC with Differential/Platelet     Status: Abnormal   Collection Time: 11/06/22  3:15 PM  Result Value Ref Range   WBC 9.2 4.0 - 10.5 K/uL   RBC 5.00 3.87 - 5.11 MIL/uL   Hemoglobin 15.5 (H) 12.0 - 15.0 g/dL   HCT 47.0 (H) 36.0 - 46.0 %   MCV 94.0 80.0 - 100.0 fL   MCH 31.0 26.0 - 34.0 pg   MCHC 33.0 30.0 - 36.0 g/dL   RDW 14.0 11.5 - 15.5 %   Platelets 183 150 - 400 K/uL   nRBC 0.0 0.0 - 0.2 %   Neutrophils Relative % 78 %   Neutro Abs 7.2 1.7 - 7.7 K/uL   Lymphocytes Relative 15 %   Lymphs Abs 1.4 0.7 - 4.0 K/uL   Monocytes Relative 6 %   Monocytes Absolute 0.5 0.1 - 1.0 K/uL   Eosinophils Relative 0 %   Eosinophils Absolute 0.0 0.0 - 0.5 K/uL   Basophils Relative 0 %   Basophils Absolute 0.0 0.0 - 0.1 K/uL   Immature Granulocytes 1 %   Abs Immature Granulocytes 0.05 0.00 - 0.07 K/uL    Comment: Performed at [facility], [address] [place], [place] [address]  CBC     Status: Abnormal   Collection Time: 11/06/22  4:38 PM  Result Value Ref Range   WBC 9.6 4.0 - 10.5 K/uL   RBC 4.99 3.87 - 5.11 MIL/uL   Hemoglobin 15.5 (H) 12.0 - 15.0 g/dL   HCT 47.5 (H) 36.0 - 46.0 %   MCV 95.2 80.0 - 100.0 fL   MCH 31.1 26.0 - 34.0 pg   MCHC 32.6 30.0 - 36.0 g/dL   RDW 14.1 11.5 - 15.5 %   Platelets 160 150 - 400 K/uL   nRBC 0.0 0.0 - 0.2 %    Comment: Performed at [facility], [address] [place], [place] [address]  Comprehensive metabolic panel     Status: Abnormal   Collection Time: 11/06/22  4:38 PM  Result Value Ref Range   Sodium 137 135 - 145 mmol/L   Potassium 3.8 3.5 - 5.1 mmol/L   Chloride 103 98 - 111 mmol/L   CO2 20 (L) 22 - 32 mmol/L   Glucose, Bld 92 70 - 99 mg/dL    Comment: Glucose reference range applies only to samples taken after fasting for at  least 8 hours.   BUN 30 (H) 8 - 23 mg/dL   Creatinine, Ser 3.34 (H) 0.44 - 1.00 mg/dL   Calcium 9.2 8.9 - 10.3 mg/dL   Total Protein 7.6 6.5 - 8.1 g/dL   Albumin 4.1 3.5 - 5.0 g/dL   AST 25 15 - 41 U/L   ALT 21 0 - 44 U/L   Alkaline Phosphatase 75 38 - 126 U/L   Total Bilirubin   0.6 0.3 - 1.2 mg/dL   GFR, Estimated 14 (L) >60 mL/min    Comment: (NOTE) Calculated using the CKD-EPI Creatinine Equation (2021)    Anion gap 14 5 - 15    Comment: Performed at [facility], [address] [place], [place] [address]  Resp Panel by RT-PCR (Flu A&B, Covid) Anterior Nasal Swab     Status: None   Collection Time: 11/06/22  5:03 PM   Specimen: Anterior Nasal Swab  Result Value Ref Range   SARS Coronavirus 2 by RT PCR NEGATIVE NEGATIVE    Comment: (NOTE) SARS-CoV-2 target nucleic acids are NOT DETECTED.  The SARS-CoV-2 RNA is generally detectable in upper respiratory specimens during the acute phase of infection. The lowest concentration of SARS-CoV-2 viral copies this assay can detect is 138 copies/mL. A negative result does not preclude SARS-Cov-2 infection and should not be used as the sole basis for treatment or other patient management decisions. A negative result may occur with  improper specimen collection/handling, submission of specimen other than nasopharyngeal swab, presence of viral mutation(s) within the areas targeted by this assay, and inadequate number of viral copies(<138 copies/mL). A negative result must be combined with clinical observations, patient history, and epidemiological information. The expected result is Negative.  Fact Sheet for Patients:  https://www.fda.gov/media/152166/download  Fact Sheet for Healthcare Providers:  https://www.fda.gov/media/152162/download  This test is no t yet approved or cleared by the [place] FDA and  has been authorized for detection and/or diagnosis of SARS-CoV-2 by FDA under an Emergency Use Authorization (EUA).  This EUA will remain  in effect (meaning this test can be used) for the duration of the COVID-19 declaration under Section 564(b)(1) of the Act, 21 U.S.C.section 360bbb-3(b)(1), unless the authorization is terminated  or revoked sooner.       Influenza A by PCR NEGATIVE NEGATIVE   Influenza B by PCR NEGATIVE NEGATIVE    Comment: (NOTE) The Xpert Xpress SARS-CoV-2/FLU/RSV plus assay is intended as an aid in the diagnosis of influenza from Nasopharyngeal swab specimens and should not be used as a sole basis for treatment. Nasal washings and aspirates are unacceptable for Xpert Xpress SARS-CoV-2/FLU/RSV testing.  Fact Sheet for Patients: https://www.fda.gov/media/152166/download  Fact Sheet for Healthcare Providers: https://www.fda.gov/media/152162/download  This test is not yet approved or cleared by the [place] FDA and has been authorized for detection and/or diagnosis of SARS-CoV-2 by FDA under an Emergency Use Authorization (EUA). This EUA will remain in effect (meaning this test can be used) for the duration of the COVID-19 declaration under Section 564(b)(1) of the Act, 21 U.S.C. section 360bbb-3(b)(1), unless the authorization is terminated or revoked.  Performed at [facility], [address] [place], [place] [address]   Culture, blood (routine x 2)     Status: None   Collection Time: 11/06/22  5:19 PM   Specimen: BLOOD  Result Value Ref Range   Specimen Description BLOOD BLOOD LEFT HAND    Special Requests      BOTTLES DRAWN AEROBIC ONLY Blood Culture adequate volume   Culture      NO GROWTH 5 DAYS Performed at [facility], [address] [place], [place] [address]    Report Status 11/11/2022 FINAL   Culture, blood (routine x 2)     Status: None   Collection Time: 11/06/22  5:19 PM   Specimen: BLOOD  Result Value Ref Range   Specimen Description BLOOD BLOOD LEFT ARM    Special Requests        BOTTLES DRAWN AEROBIC AND ANAEROBIC  Blood Culture adequate volume   Culture      NO GROWTH 5 DAYS Performed at [facility], [address] [place], [place] [address]    Report Status 11/11/2022 FINAL   Lactic acid, plasma     Status: Abnormal   Collection Time: 11/06/22  5:19 PM  Result Value Ref Range   Lactic Acid, Venous 2.1 (HH) 0.5 - 1.9 mmol/L    Comment: CRITICAL RESULT CALLED TO, READ BACK BY AND VERIFIED WITH JOHN HAGEN @1802 ON 11/06/22 SKL Performed at [facility], [address] [place], [place] [address]   Troponin I (High Sensitivity)     Status: Abnormal   Collection Time: 11/06/22  5:19 PM  Result Value Ref Range   Troponin I (High Sensitivity) 27 (H) <18 ng/L    Comment: (NOTE) Elevated high sensitivity troponin I (hsTnI) values and significant  changes across serial measurements may suggest ACS but many other  chronic and acute conditions are known to elevate hsTnI results.  Refer to the "Links" section for chest pain algorithms and additional  guidance. Performed at [facility], [address] , [place] [address]   Procalcitonin - Baseline     Status: None   Collection Time: 11/06/22  5:19 PM  Result Value Ref Range   Procalcitonin 0.10 ng/mL    Comment:        Interpretation: PCT (Procalcitonin) <= 0.5 ng/mL: Systemic infection (sepsis) is not likely. Local bacterial infection is possible. (NOTE)       Sepsis PCT Algorithm           Lower Respiratory Tract                                      Infection PCT Algorithm    ----------------------------     ----------------------------         PCT < 0.25 ng/mL                PCT < 0.10 ng/mL          Strongly encourage             Strongly discourage   discontinuation of antibiotics    initiation of antibiotics    ----------------------------     -----------------------------       PCT 0.25 - 0.50 ng/mL            PCT 0.10 - 0.25 ng/mL               OR       >80% decrease in PCT            Discourage  initiation of                                            antibiotics      Encourage discontinuation           of antibiotics    ----------------------------     -----------------------------         PCT >= 0.50 ng/mL              PCT 0.26 - 0.50 ng/mL               AND        <  80% decrease in PCT             Encourage initiation of                                             antibiotics       Encourage continuation           of antibiotics    ----------------------------     -----------------------------        PCT >= 0.50 ng/mL                  PCT > 0.50 ng/mL               AND         increase in PCT                  Strongly encourage                                      initiation of antibiotics    Strongly encourage escalation           of antibiotics                                     -----------------------------                                           PCT <= 0.25 ng/mL                                                 OR                                        > 80% decrease in PCT                                      Discontinue / Do not initiate                                             antibiotics  Performed at [facility], [address] [place], [place] [address]   Urinalysis, Routine w reflex microscopic Urine, Clean Catch     Status: Abnormal   Collection Time: 11/07/22  5:32 AM  Result Value Ref Range   Color, Urine STRAW (A) YELLOW   APPearance CLEAR (A) CLEAR   Specific Gravity, Urine 1.006 1.005 - 1.030   pH 5.0 5.0 - 8.0   Glucose, UA NEGATIVE NEGATIVE mg/dL   Hgb urine dipstick NEGATIVE NEGATIVE   Bilirubin Urine NEGATIVE NEGATIVE   Ketones, ur NEGATIVE NEGATIVE mg/dL   Protein, ur NEGATIVE NEGATIVE mg/dL   Nitrite NEGATIVE   NEGATIVE   Leukocytes,Ua NEGATIVE NEGATIVE    Comment: Performed at [facility], [address] [place], [place] [address]  Basic metabolic panel     Status: Abnormal   Collection Time: 11/07/22  5:32 AM   Result Value Ref Range   Sodium 143 135 - 145 mmol/L   Potassium 3.0 (L) 3.5 - 5.1 mmol/L   Chloride 112 (H) 98 - 111 mmol/L   CO2 23 22 - 32 mmol/L   Glucose, Bld 100 (H) 70 - 99 mg/dL    Comment: Glucose reference range applies only to samples taken after fasting for at least 8 hours.   BUN 25 (H) 8 - 23 mg/dL   Creatinine, Ser 1.72 (H) 0.44 - 1.00 mg/dL   Calcium 7.8 (L) 8.9 - 10.3 mg/dL   GFR, Estimated 31 (L) >60 mL/min    Comment: (NOTE) Calculated using the CKD-EPI Creatinine Equation (2021)    Anion gap 8 5 - 15    Comment: Performed at [facility], [address] [place], [place] [address]  CBC     Status: Abnormal   Collection Time: 11/07/22  5:32 AM  Result Value Ref Range   WBC 5.7 4.0 - 10.5 K/uL   RBC 4.12 3.87 - 5.11 MIL/uL   Hemoglobin 12.6 12.0 - 15.0 g/dL   HCT 39.5 36.0 - 46.0 %   MCV 95.9 80.0 - 100.0 fL   MCH 30.6 26.0 - 34.0 pg   MCHC 31.9 30.0 - 36.0 g/dL   RDW 14.0 11.5 - 15.5 %   Platelets 149 (L) 150 - 400 K/uL   nRBC 0.0 0.0 - 0.2 %    Comment: Performed at [facility], [address] [place], [place] [address]  Magnesium     Status: None   Collection Time: 11/07/22  5:32 AM  Result Value Ref Range   Magnesium 1.7 1.7 - 2.4 mg/dL    Comment: Performed at  Hospital Lab, [address] [place], [place] [address]  Lactic acid, plasma     Status: None   Collection Time: 11/07/22  1:19 PM  Result Value Ref Range   Lactic Acid, Venous 1.5 0.5 - 1.9 mmol/L    Comment: Performed at  Hospital Lab, [address] [place],  [address]    Assessment/Plan:  Carotid stenosis The patient reports a previous history of carotid stenosis and she is about 10 to 15 years status post right carotid endarterectomy.  This was checked with an ultrasound which had shown significant velocity increase in her left carotid artery.  This was followed by CT angiogram which I have independently reviewed.  The reports from the  duplex were close 70 to 99% left ICA stenosis.  The CT scan was interpreted as a 75% left ICA stenosis but it was noted to be somewhat calcific and that this degree of stenosis may be an underestimate.  I believe it is greater than 75% on the left.  It was reported as mild recurrent narrowing in the right carotid artery although I would estimate this is approaching 50% as well.  Had a long discussion today with the patient regarding the risks and benefits of carotid endarterectomy versus carotid artery stenting.  She had a very difficult time with her carotid endarterectomy previously and will be very desirous of a carotid stent which I think is reasonable given her anatomy.  I have discussed the risks and benefits of both carotid arterectomy and carotid stenting.  I have   discussed the differences between the 2 procedure and the expected course.  She voices her understanding and would like to proceed with left carotid stent placement in the near future.  Essential hypertension blood pressure control important in reducing the progression of atherosclerotic disease. On appropriate oral medications.   Dyslipidemia lipid control important in reducing the progression of atherosclerotic disease. Continue statin therapy   Tobacco use disorder Represents a significant atherosclerotic risk factor and smoking cessation would be of benefit for her vascular disease.      Tamarah Bhullar 01/09/2023, 2:35 PM   This note was created with Dragon medical transcription system.  Any errors from dictation are unintentional.   

## 2023-01-09 NOTE — Assessment & Plan Note (Signed)
lipid control important in reducing the progression of atherosclerotic disease. Continue statin therapy  

## 2023-01-09 NOTE — H&P (View-Only) (Signed)
Patient ID: Barbara Contreras, female   DOB: [birth year], 72 y.o.   MRN: [id number]  Chief Complaint  Patient presents with   New Patient (Initial Visit)    Specialty    HPI Barbara Contreras is a 72 y.o. female.  I am asked to see the patient by Dr. Nehemiah Massed for evaluation of carotid stenosis.  The patient reports a previous history of carotid stenosis and she is about 10 to 15 years status post right carotid endarterectomy.  This was checked with an ultrasound which had shown significant velocity increase in her left carotid artery.  This was followed by CT angiogram which I have independently reviewed.  The reports from the duplex were close 70 to 99% left ICA stenosis.  The CT scan was interpreted as a 75% left ICA stenosis but it was noted to be somewhat calcific and that this degree of stenosis may be an underestimate.  I believe it is greater than 75% on the left.  It was reported as mild recurrent narrowing in the right carotid artery although I would estimate this is approaching 50% as well.  No focal neurologic symptoms. Specifically, the patient denies amaurosis fugax, speech or swallowing difficulties, or arm or leg weakness or numbness    Past Medical History:  Diagnosis Date   Arthritis    Asthma    Back pain    Breast cancer ([place]) 2022   Carotid artery stenosis    Carotid stenosis    Coronary artery disease    Depression    Emphysema of lung (HCC)    GERD (gastroesophageal reflux disease)    Heart murmur    Hip pain    History of Lyme disease    HLD (hyperlipidemia)    Hyperlipemia    Hypertension    IBS (irritable bowel syndrome)    IBS (irritable bowel syndrome)    Lyme disease    Pre-diabetes    Sciatica    Sciatica    Seizures (HCC)    Tremor    Vitamin D deficiency    Vitamin D deficiency     Past Surgical History:  Procedure Laterality Date   ABDOMINAL HYSTERECTOMY     BREAST BIOPSY Right 07/22/2021   [place] bx 1:00 area, venus marker, IMC tubular  features   BREAST BIOPSY Right 07/22/2021   [place] bx 2:00 ribbon marker, fibroadenoma   BREAST LUMPECTOMY Right 08/24/2021   re-excision   BREAST LUMPECTOMY Right 08/12/2021   CAROTID ENDARTERECTOMY Right    CHOLECYSTECTOMY     COLONOSCOPY WITH PROPOFOL N/A 09/13/2018   Procedure: COLONOSCOPY WITH PROPOFOL;  Surgeon: Lollie Sails, MD;  Location: [facility] [place] ENDOSCOPY;  Service: Endoscopy;  Laterality: N/A;   ESOPHAGOGASTRODUODENOSCOPY N/A 05/03/2021   Procedure: ESOPHAGOGASTRODUODENOSCOPY (EGD);  Surgeon: Lesly Rubenstein, MD;  Location: [facility] ENDOSCOPY;  Service: Endoscopy;  Laterality: N/A;   paranasal sinusotomy     PART MASTECTOMY,RADIO FREQUENCY LOCALIZER,AXILLARY SENTINEL NODE BIOPSY Right 08/12/2021   Procedure: PART MASTECTOMY,RADIO FREQUENCY LOCALIZER,AXILLARY SENTINEL NODE BIOPSY;  Surgeon: Benjamine Sprague, DO;  Location: [facility] ORS;  Service: General;  Laterality: Right;   RE-EXCISION OF BREAST CANCER,SUPERIOR MARGINS Right 08/24/2021   Procedure: RE-EXCISION OF BREAST CANCER,SUPERIOR MARGINS;  Surgeon: Benjamine Sprague, DO;  Location: [facility] ORS;  Service: General;  Laterality: Right;    Family History  Problem Relation Age of Onset   Alcohol abuse Father    Alzheimer's disease Father    Anxiety disorder Father    Depression Father  Alcohol abuse Mother    CAD Mother    Diabetes Mother    Hyperlipidemia Mother    Depression Mother       Social History   Tobacco Use   Smoking status: Every Day    Packs/day: 0.50    Years: 55.00    Total pack years: 27.50    Types: Cigarettes   Smokeless tobacco: Never  Vaping Use   Vaping Use: Never used  Substance Use Topics   Alcohol use: Yes    Alcohol/week: 0.0 standard drinks of alcohol    Comment: occasional   Drug use: Yes    Types: Marijuana     Allergies  Allergen Reactions   Morphine And Related Itching   Clindamycin Other (See Comments)   Erythromycin     Other reaction(s): Other (see comments)   Other     Other  reaction(s): Other (See Comments) Redness and itching   Penicillins Other (See Comments)    TOLERATED CEFAZOLIN PRIOR Reaction: unknown Has patient had a PCN reaction causing immediate rash, facial/tongue/throat swelling, SOB or lightheadedness with hypotension: Unknown Has patient had a PCN reaction causing severe rash involving mucus membranes or skin necrosis: Unknown Has patient had a PCN reaction that required hospitalization: Unknown Has patient had a PCN reaction occurring within the last 10 years: no If all of the above answers are "NO", then may proceed with Cephalosporin use.   Promethazine-Phenylephrine Other (See Comments)    Muscle spasm   Tape Other (See Comments)    Redness and itching    Current Outpatient Medications  Medication Sig Dispense Refill   albuterol (VENTOLIN HFA) 108 (90 Base) MCG/ACT inhaler Inhale 1-2 puffs into the lungs every 6 (six) hours as needed for wheezing or shortness of breath.     amLODipine (NORVASC) 5 MG tablet Take 5 mg by mouth daily.     anastrozole (ARIMIDEX) 1 MG tablet TAKE 1 TABLET BY MOUTH EVERY DAY 90 tablet 0   aspirin EC 81 MG tablet Take 81 mg by mouth daily.     atorvastatin (LIPITOR) 80 MG tablet Take 80 mg by mouth daily.     baclofen (LIORESAL) 10 MG tablet Take 5-10 mg by mouth at bedtime as needed for muscle spasms.     calcium carbonate (TUMS EX) 750 MG chewable tablet Chew 1 tablet by mouth daily as needed for heartburn.     Cholecalciferol (VITAMIN D3) 10 MCG (400 UNIT) tablet Take 400 Units by mouth daily.     clobetasol (TEMOVATE) 0.05 % external solution Apply 1 Application topically daily.     clopidogrel (PLAVIX) 75 MG tablet Take 1 tablet (75 mg total) by mouth daily. 30 tablet 6   dicyclomine (BENTYL) 10 MG capsule Take 10 mg by mouth in the morning and at bedtime.     docusate sodium (COLACE) 100 MG capsule Take 100 mg by mouth 2 (two) times daily.     famotidine (PEPCID) 20 MG tablet Take 20 mg by mouth 2 (two)  times daily.     gabapentin (NEURONTIN) 600 MG tablet Take 600 mg by mouth 3 (three) times daily.  5   ketoconazole (NIZORAL) 2 % shampoo Apply 1 application topically once a week.     lisinopril-hydrochlorothiazide (ZESTORETIC) 20-12.5 MG tablet Take 1 tablet by mouth in the morning and at bedtime. Hold until you see your doctor     Multiple Vitamin (MULTIVITAMIN WITH MINERALS) TABS tablet Take 1 tablet by mouth daily.  venlafaxine XR (EFFEXOR-XR) 75 MG 24 hr capsule Take 75 mg by mouth 3 (three) times daily.     vitamin B-12 (CYANOCOBALAMIN) 1000 MCG tablet Take 1,000 mcg by mouth daily.     Zinc Sulfate (ZINC 15 PO) Take 15 mg by mouth daily.     No current facility-administered medications for this visit.      REVIEW OF SYSTEMS (Negative unless checked)  Constitutional: []$ Weight loss  []$ Fever  []$ Chills Cardiac: []$ Chest pain   []$ Chest pressure   []$ Palpitations   []$ Shortness of breath when laying flat   []$ Shortness of breath at rest   [x]$ Shortness of breath with exertion. Vascular:  []$ Pain in legs with walking   []$ Pain in legs at rest   []$ Pain in legs when laying flat   []$ Claudication   []$ Pain in feet when walking  []$ Pain in feet at rest  []$ Pain in feet when laying flat   []$ History of DVT   []$ Phlebitis   []$ Swelling in legs   []$ Varicose veins   []$ Non-healing ulcers Pulmonary:   []$ Uses home oxygen   []$ Productive cough   []$ Hemoptysis   []$ Wheeze  [x]$ COPD   []$ Asthma Neurologic:  []$ Dizziness  []$ Blackouts   [x]$ Seizures   []$ History of stroke   []$ History of TIA  []$ Aphasia   []$ Temporary blindness   []$ Dysphagia   []$ Weakness or numbness in arms   []$ Weakness or numbness in legs Musculoskeletal:  [x]$ Arthritis   []$ Joint swelling   []$ Joint pain   [x]$ Low back pain Hematologic:  []$ Easy bruising  []$ Easy bleeding   []$ Hypercoagulable state   []$ Anemic  []$ Hepatitis Gastrointestinal:  []$ Blood in stool   []$ Vomiting blood  [x]$ Gastroesophageal reflux/heartburn   []$ Abdominal pain Genitourinary:  []$ Chronic kidney  disease   []$ Difficult urination  []$ Frequent urination  []$ Burning with urination   []$ Hematuria Skin:  []$ Rashes   []$ Ulcers   []$ Wounds Psychological:  []$ History of anxiety   []$  History of major depression.    Physical Exam BP 136/72   Pulse 79   Ht 5' 5"$  (1.651 m)   Wt 161 lb (73 kg)   BMI 26.79 kg/m  Gen:  WD/WN, NAD Head: [place]/AT, No temporalis wasting.  Ear/Nose/Throat: Hearing grossly intact, nares w/o erythema or drainage, oropharynx w/o Erythema/Exudate Eyes: Conjunctiva clear, sclera non-icteric  Neck: trachea midline.  Pulmonary:  Good air movement, clear to auscultation bilaterally.  Cardiac: RRR, no JVD Vascular:  Vessel Right Left  Radial Palpable Palpable           Musculoskeletal: M/S 5/5 throughout.  Extremities without ischemic changes.  No deformity or atrophy. No edema. Neurologic: Sensation grossly intact in extremities.  Symmetrical.  Speech is fluent. Motor exam as listed above. Psychiatric: Judgment intact, Mood & affect appropriate for pt's clinical situation. Dermatologic: No rashes or ulcers noted.  No cellulitis or open wounds. Lymph : No Cervical, Axillary, or Inguinal lymphadenopathy.   Radiology No results found.  Labs Recent Results (from the past 2160 hour(s))  Comprehensive metabolic panel     Status: Abnormal   Collection Time: 11/06/22  3:15 PM  Result Value Ref Range   Sodium 138 135 - 145 mmol/L   Potassium 3.5 3.5 - 5.1 mmol/L   Chloride 101 98 - 111 mmol/L   CO2 23 22 - 32 mmol/L   Glucose, Bld 114 (H) 70 - 99 mg/dL    Comment: Glucose reference range applies only to samples taken after fasting for at least 8 hours.   BUN 29 (H) 8 - 23 mg/dL   Creatinine, Ser 3.34 (  H) 0.44 - 1.00 mg/dL   Calcium 8.9 8.9 - 10.3 mg/dL   Total Protein 7.4 6.5 - 8.1 g/dL   Albumin 4.1 3.5 - 5.0 g/dL   AST 22 15 - 41 U/L   ALT 21 0 - 44 U/L   Alkaline Phosphatase 74 38 - 126 U/L   Total Bilirubin 0.6 0.3 - 1.2 mg/dL   GFR, Estimated 14 (L) >60 mL/min     Comment: (NOTE) Calculated using the CKD-EPI Creatinine Equation (2021)    Anion gap 14 5 - 15    Comment: Performed at [facility], [address] [place], [place] [address]  CBC with Differential/Platelet     Status: Abnormal   Collection Time: 11/06/22  3:15 PM  Result Value Ref Range   WBC 9.2 4.0 - 10.5 K/uL   RBC 5.00 3.87 - 5.11 MIL/uL   Hemoglobin 15.5 (H) 12.0 - 15.0 g/dL   HCT 47.0 (H) 36.0 - 46.0 %   MCV 94.0 80.0 - 100.0 fL   MCH 31.0 26.0 - 34.0 pg   MCHC 33.0 30.0 - 36.0 g/dL   RDW 14.0 11.5 - 15.5 %   Platelets 183 150 - 400 K/uL   nRBC 0.0 0.0 - 0.2 %   Neutrophils Relative % 78 %   Neutro Abs 7.2 1.7 - 7.7 K/uL   Lymphocytes Relative 15 %   Lymphs Abs 1.4 0.7 - 4.0 K/uL   Monocytes Relative 6 %   Monocytes Absolute 0.5 0.1 - 1.0 K/uL   Eosinophils Relative 0 %   Eosinophils Absolute 0.0 0.0 - 0.5 K/uL   Basophils Relative 0 %   Basophils Absolute 0.0 0.0 - 0.1 K/uL   Immature Granulocytes 1 %   Abs Immature Granulocytes 0.05 0.00 - 0.07 K/uL    Comment: Performed at [facility], [address] [place], [place] [address]  CBC     Status: Abnormal   Collection Time: 11/06/22  4:38 PM  Result Value Ref Range   WBC 9.6 4.0 - 10.5 K/uL   RBC 4.99 3.87 - 5.11 MIL/uL   Hemoglobin 15.5 (H) 12.0 - 15.0 g/dL   HCT 47.5 (H) 36.0 - 46.0 %   MCV 95.2 80.0 - 100.0 fL   MCH 31.1 26.0 - 34.0 pg   MCHC 32.6 30.0 - 36.0 g/dL   RDW 14.1 11.5 - 15.5 %   Platelets 160 150 - 400 K/uL   nRBC 0.0 0.0 - 0.2 %    Comment: Performed at [facility], [address] [place], [place] [address]  Comprehensive metabolic panel     Status: Abnormal   Collection Time: 11/06/22  4:38 PM  Result Value Ref Range   Sodium 137 135 - 145 mmol/L   Potassium 3.8 3.5 - 5.1 mmol/L   Chloride 103 98 - 111 mmol/L   CO2 20 (L) 22 - 32 mmol/L   Glucose, Bld 92 70 - 99 mg/dL    Comment: Glucose reference range applies only to samples taken after fasting for at  least 8 hours.   BUN 30 (H) 8 - 23 mg/dL   Creatinine, Ser 3.34 (H) 0.44 - 1.00 mg/dL   Calcium 9.2 8.9 - 10.3 mg/dL   Total Protein 7.6 6.5 - 8.1 g/dL   Albumin 4.1 3.5 - 5.0 g/dL   AST 25 15 - 41 U/L   ALT 21 0 - 44 U/L   Alkaline Phosphatase 75 38 - 126 U/L   Total Bilirubin  0.6 0.3 - 1.2 mg/dL   GFR, Estimated 14 (L) >60 mL/min    Comment: (NOTE) Calculated using the CKD-EPI Creatinine Equation (2021)    Anion gap 14 5 - 15    Comment: Performed at [facility], [address] [place], [place] [address]  Resp Panel by RT-PCR (Flu A&B, Covid) Anterior Nasal Swab     Status: None   Collection Time: 11/06/22  5:03 PM   Specimen: Anterior Nasal Swab  Result Value Ref Range   SARS Coronavirus 2 by RT PCR NEGATIVE NEGATIVE    Comment: (NOTE) SARS-CoV-2 target nucleic acids are NOT DETECTED.  The SARS-CoV-2 RNA is generally detectable in upper respiratory specimens during the acute phase of infection. The lowest concentration of SARS-CoV-2 viral copies this assay can detect is 138 copies/mL. A negative result does not preclude SARS-Cov-2 infection and should not be used as the sole basis for treatment or other patient management decisions. A negative result may occur with  improper specimen collection/handling, submission of specimen other than nasopharyngeal swab, presence of viral mutation(s) within the areas targeted by this assay, and inadequate number of viral copies(<138 copies/mL). A negative result must be combined with clinical observations, patient history, and epidemiological information. The expected result is Negative.  Fact Sheet for Patients:  EntrepreneurPulse.com.au  Fact Sheet for Healthcare Providers:  IncredibleEmployment.be  This test is no t yet approved or cleared by the [place] FDA and  has been authorized for detection and/or diagnosis of SARS-CoV-2 by FDA under an Emergency Use Authorization (EUA).  This EUA will remain  in effect (meaning this test can be used) for the duration of the COVID-19 declaration under Section 564(b)(1) of the Act, 21 U.S.C.section 360bbb-3(b)(1), unless the authorization is terminated  or revoked sooner.       Influenza A by PCR NEGATIVE NEGATIVE   Influenza B by PCR NEGATIVE NEGATIVE    Comment: (NOTE) The Xpert Xpress SARS-CoV-2/FLU/RSV plus assay is intended as an aid in the diagnosis of influenza from Nasopharyngeal swab specimens and should not be used as a sole basis for treatment. Nasal washings and aspirates are unacceptable for Xpert Xpress SARS-CoV-2/FLU/RSV testing.  Fact Sheet for Patients: EntrepreneurPulse.com.au  Fact Sheet for Healthcare Providers: IncredibleEmployment.be  This test is not yet approved or cleared by the [place] FDA and has been authorized for detection and/or diagnosis of SARS-CoV-2 by FDA under an Emergency Use Authorization (EUA). This EUA will remain in effect (meaning this test can be used) for the duration of the COVID-19 declaration under Section 564(b)(1) of the Act, 21 U.S.C. section 360bbb-3(b)(1), unless the authorization is terminated or revoked.  Performed at [facility], [address] [place], [place] [address]   Culture, blood (routine x 2)     Status: None   Collection Time: 11/06/22  5:19 PM   Specimen: BLOOD  Result Value Ref Range   Specimen Description BLOOD BLOOD LEFT HAND    Special Requests      BOTTLES DRAWN AEROBIC ONLY Blood Culture adequate volume   Culture      NO GROWTH 5 DAYS Performed at [facility], [address], [place], [place] [address]    Report Status 11/11/2022 FINAL   Culture, blood (routine x 2)     Status: None   Collection Time: 11/06/22  5:19 PM   Specimen: BLOOD  Result Value Ref Range   Specimen Description BLOOD BLOOD LEFT ARM    Special Requests  BOTTLES DRAWN AEROBIC AND ANAEROBIC  Blood Culture adequate volume   Culture      NO GROWTH 5 DAYS Performed at [facility], [address] [place], [place] [address]    Report Status 11/11/2022 FINAL   Lactic acid, plasma     Status: Abnormal   Collection Time: 11/06/22  5:19 PM  Result Value Ref Range   Lactic Acid, Venous 2.1 (HH) 0.5 - 1.9 mmol/L    Comment: CRITICAL RESULT CALLED TO, READ BACK BY AND VERIFIED WITH JOHN HAGEN @1802$  ON 11/06/22 SKL Performed at [facility], [place], [place] [address]   Troponin I (High Sensitivity)     Status: Abnormal   Collection Time: 11/06/22  5:19 PM  Result Value Ref Range   Troponin I (High Sensitivity) 27 (H) <18 ng/L    Comment: (NOTE) Elevated high sensitivity troponin I (hsTnI) values and significant  changes across serial measurements may suggest ACS but many other  chronic and acute conditions are known to elevate hsTnI results.  Refer to the "Links" section for chest pain algorithms and additional  guidance. Performed at [facility], [address] [place], [place] [address]   Procalcitonin - Baseline     Status: None   Collection Time: 11/06/22  5:19 PM  Result Value Ref Range   Procalcitonin 0.10 ng/mL    Comment:        Interpretation: PCT (Procalcitonin) <= 0.5 ng/mL: Systemic infection (sepsis) is not likely. Local bacterial infection is possible. (NOTE)       Sepsis PCT Algorithm           Lower Respiratory Tract                                      Infection PCT Algorithm    ----------------------------     ----------------------------         PCT < 0.25 ng/mL                PCT < 0.10 ng/mL          Strongly encourage             Strongly discourage   discontinuation of antibiotics    initiation of antibiotics    ----------------------------     -----------------------------       PCT 0.25 - 0.50 ng/mL            PCT 0.10 - 0.25 ng/mL               OR       >80% decrease in PCT            Discourage  initiation of                                            antibiotics      Encourage discontinuation           of antibiotics    ----------------------------     -----------------------------         PCT >= 0.50 ng/mL              PCT 0.26 - 0.50 ng/mL               AND        <  80% decrease in PCT             Encourage initiation of                                             antibiotics       Encourage continuation           of antibiotics    ----------------------------     -----------------------------        PCT >= 0.50 ng/mL                  PCT > 0.50 ng/mL               AND         increase in PCT                  Strongly encourage                                      initiation of antibiotics    Strongly encourage escalation           of antibiotics                                     -----------------------------                                           PCT <= 0.25 ng/mL                                                 OR                                        > 80% decrease in PCT                                      Discontinue / Do not initiate                                             antibiotics  Performed at [facility], [address] [place], [place] [address]   Urinalysis, Routine w reflex microscopic Urine, Clean Catch     Status: Abnormal   Collection Time: 11/07/22  5:32 AM  Result Value Ref Range   Color, Urine STRAW (A) YELLOW   APPearance CLEAR (A) CLEAR   Specific Gravity, Urine 1.006 1.005 - 1.030   pH 5.0 5.0 - 8.0   Glucose, UA NEGATIVE NEGATIVE mg/dL   Hgb urine dipstick NEGATIVE NEGATIVE   Bilirubin Urine NEGATIVE NEGATIVE   Ketones, ur NEGATIVE NEGATIVE mg/dL   Protein, ur NEGATIVE NEGATIVE mg/dL   Nitrite NEGATIVE  NEGATIVE   Leukocytes,Ua NEGATIVE NEGATIVE    Comment: Performed at [facility], [address] [place], [place] [id number]  Basic metabolic panel     Status: Abnormal   Collection Time: 11/07/22  5:32 AM   Result Value Ref Range   Sodium 143 135 - 145 mmol/L   Potassium 3.0 (L) 3.5 - 5.1 mmol/L   Chloride 112 (H) 98 - 111 mmol/L   CO2 23 22 - 32 mmol/L   Glucose, Bld 100 (H) 70 - 99 mg/dL    Comment: Glucose reference range applies only to samples taken after fasting for at least 8 hours.   BUN 25 (H) 8 - 23 mg/dL   Creatinine, Ser 1.72 (H) 0.44 - 1.00 mg/dL   Calcium 7.8 (L) 8.9 - 10.3 mg/dL   GFR, Estimated 31 (L) >60 mL/min    Comment: (NOTE) Calculated using the CKD-EPI Creatinine Equation (2021)    Anion gap 8 5 - 15    Comment: Performed at [facility], [address] [place], [place] [address]  CBC     Status: Abnormal   Collection Time: 11/07/22  5:32 AM  Result Value Ref Range   WBC 5.7 4.0 - 10.5 K/uL   RBC 4.12 3.87 - 5.11 MIL/uL   Hemoglobin 12.6 12.0 - 15.0 g/dL   HCT 39.5 36.0 - 46.0 %   MCV 95.9 80.0 - 100.0 fL   MCH 30.6 26.0 - 34.0 pg   MCHC 31.9 30.0 - 36.0 g/dL   RDW 14.0 11.5 - 15.5 %   Platelets 149 (L) 150 - 400 K/uL   nRBC 0.0 0.0 - 0.2 %    Comment: Performed at [facility], [address] [place], [place] [address]  Magnesium     Status: None   Collection Time: 11/07/22  5:32 AM  Result Value Ref Range   Magnesium 1.7 1.7 - 2.4 mg/dL    Comment: Performed at [facility], [address] [place], [place] [address]  Lactic acid, plasma     Status: None   Collection Time: 11/07/22  1:19 PM  Result Value Ref Range   Lactic Acid, Venous 1.5 0.5 - 1.9 mmol/L    Comment: Performed at [facility], [address] [place], [place] [address]    Assessment/Plan:  Carotid stenosis The patient reports a previous history of carotid stenosis and she is about 10 to 15 years status post right carotid endarterectomy.  This was checked with an ultrasound which had shown significant velocity increase in her left carotid artery.  This was followed by CT angiogram which I have independently reviewed.  The reports from the  duplex were close 70 to 99% left ICA stenosis.  The CT scan was interpreted as a 75% left ICA stenosis but it was noted to be somewhat calcific and that this degree of stenosis may be an underestimate.  I believe it is greater than 75% on the left.  It was reported as mild recurrent narrowing in the right carotid artery although I would estimate this is approaching 50% as well.  Had a long discussion today with the patient regarding the risks and benefits of carotid endarterectomy versus carotid artery stenting.  She had a very difficult time with her carotid endarterectomy previously and will be very desirous of a carotid stent which I think is reasonable given her anatomy.  I have discussed the risks and benefits of both carotid arterectomy and carotid stenting.  I have  discussed the differences between the 2 procedure and the expected course.  She voices her understanding and would like to proceed with left carotid stent placement in the near future.  Essential hypertension blood pressure control important in reducing the progression of atherosclerotic disease. On appropriate oral medications.   Dyslipidemia lipid control important in reducing the progression of atherosclerotic disease. Continue statin therapy   Tobacco use disorder Represents a significant atherosclerotic risk factor and smoking cessation would be of benefit for her vascular disease.      Leotis Pain 01/09/2023, 2:35 PM   This note was created with Dragon medical transcription system.  Any errors from dictation are unintentional.

## 2023-01-15 ENCOUNTER — Telehealth (INDEPENDENT_AMBULATORY_CARE_PROVIDER_SITE_OTHER): Payer: Self-pay

## 2023-01-15 NOTE — Telephone Encounter (Signed)
Spoke with the patient and she is scheduled with  Dr. Lucky Cowboy on 01/25/23 for [facility]. Pre-procedure instructions were discussed and will be mailed. Patient was offered 01/18/23, 01/22/23 and 01/25/23. 01/18/23 was not available when I rechecked and patient had something to do so could not do 01/22/23.

## 2023-01-25 ENCOUNTER — Encounter: Payer: Self-pay | Admitting: Vascular Surgery

## 2023-01-25 ENCOUNTER — Encounter
Admission: RE | Disposition: A | Discharge status: Home / Self Care | Payer: Self-pay | Source: Home / Self Care | Attending: Vascular Surgery

## 2023-01-25 ENCOUNTER — Inpatient Hospital Stay
Admission: RE | Admit: 2023-01-25 | Discharge: 2023-01-29 | DRG: 035 | Disposition: A | Discharge status: Home / Self Care | Payer: 59 | Attending: Vascular Surgery | Admitting: Vascular Surgery

## 2023-01-25 ENCOUNTER — Other Ambulatory Visit: Payer: Self-pay

## 2023-01-25 DIAGNOSIS — R001 Bradycardia, unspecified: Secondary | ICD-10-CM | POA: Diagnosis not present

## 2023-01-25 DIAGNOSIS — K219 Gastro-esophageal reflux disease without esophagitis: Secondary | ICD-10-CM | POA: Diagnosis present

## 2023-01-25 DIAGNOSIS — J9811 Atelectasis: Secondary | ICD-10-CM | POA: Diagnosis not present

## 2023-01-25 DIAGNOSIS — Z881 Allergy status to other antibiotic agents status: Secondary | ICD-10-CM | POA: Diagnosis not present

## 2023-01-25 DIAGNOSIS — Z8249 Family history of ischemic heart disease and other diseases of the circulatory system: Secondary | ICD-10-CM

## 2023-01-25 DIAGNOSIS — J45909 Unspecified asthma, uncomplicated: Secondary | ICD-10-CM | POA: Diagnosis present

## 2023-01-25 DIAGNOSIS — Z90710 Acquired absence of both cervix and uterus: Secondary | ICD-10-CM

## 2023-01-25 DIAGNOSIS — I6522 Occlusion and stenosis of left carotid artery: Secondary | ICD-10-CM | POA: Diagnosis present

## 2023-01-25 DIAGNOSIS — I4891 Unspecified atrial fibrillation: Secondary | ICD-10-CM | POA: Diagnosis not present

## 2023-01-25 DIAGNOSIS — Z79899 Other long term (current) drug therapy: Secondary | ICD-10-CM | POA: Diagnosis not present

## 2023-01-25 DIAGNOSIS — Z880 Allergy status to penicillin: Secondary | ICD-10-CM | POA: Diagnosis not present

## 2023-01-25 DIAGNOSIS — F32A Depression, unspecified: Secondary | ICD-10-CM | POA: Diagnosis present

## 2023-01-25 DIAGNOSIS — Z818 Family history of other mental and behavioral disorders: Secondary | ICD-10-CM

## 2023-01-25 DIAGNOSIS — Z885 Allergy status to narcotic agent status: Secondary | ICD-10-CM

## 2023-01-25 DIAGNOSIS — E785 Hyperlipidemia, unspecified: Secondary | ICD-10-CM | POA: Diagnosis present

## 2023-01-25 DIAGNOSIS — Z820 Family history of epilepsy and other diseases of the nervous system: Secondary | ICD-10-CM

## 2023-01-25 DIAGNOSIS — Z888 Allergy status to other drugs, medicaments and biological substances status: Secondary | ICD-10-CM | POA: Diagnosis not present

## 2023-01-25 DIAGNOSIS — I70201 Unspecified atherosclerosis of native arteries of extremities, right leg: Secondary | ICD-10-CM

## 2023-01-25 DIAGNOSIS — I10 Essential (primary) hypertension: Secondary | ICD-10-CM | POA: Diagnosis present

## 2023-01-25 DIAGNOSIS — I708 Atherosclerosis of other arteries: Secondary | ICD-10-CM | POA: Diagnosis present

## 2023-01-25 DIAGNOSIS — Z9011 Acquired absence of right breast and nipple: Secondary | ICD-10-CM

## 2023-01-25 DIAGNOSIS — R7303 Prediabetes: Secondary | ICD-10-CM | POA: Diagnosis present

## 2023-01-25 DIAGNOSIS — F17210 Nicotine dependence, cigarettes, uncomplicated: Secondary | ICD-10-CM | POA: Diagnosis present

## 2023-01-25 DIAGNOSIS — Z9889 Other specified postprocedural states: Secondary | ICD-10-CM

## 2023-01-25 DIAGNOSIS — Z9049 Acquired absence of other specified parts of digestive tract: Secondary | ICD-10-CM

## 2023-01-25 DIAGNOSIS — I2510 Atherosclerotic heart disease of native coronary artery without angina pectoris: Secondary | ICD-10-CM | POA: Diagnosis present

## 2023-01-25 DIAGNOSIS — Z853 Personal history of malignant neoplasm of breast: Secondary | ICD-10-CM | POA: Diagnosis not present

## 2023-01-25 DIAGNOSIS — K589 Irritable bowel syndrome without diarrhea: Secondary | ICD-10-CM | POA: Diagnosis present

## 2023-01-25 DIAGNOSIS — R0902 Hypoxemia: Secondary | ICD-10-CM | POA: Diagnosis not present

## 2023-01-25 DIAGNOSIS — Z811 Family history of alcohol abuse and dependence: Secondary | ICD-10-CM

## 2023-01-25 DIAGNOSIS — I723 Aneurysm of iliac artery: Secondary | ICD-10-CM | POA: Diagnosis not present

## 2023-01-25 DIAGNOSIS — J439 Emphysema, unspecified: Secondary | ICD-10-CM | POA: Diagnosis present

## 2023-01-25 DIAGNOSIS — Q2549 Other congenital malformations of aorta: Secondary | ICD-10-CM

## 2023-01-25 DIAGNOSIS — Z833 Family history of diabetes mellitus: Secondary | ICD-10-CM

## 2023-01-25 DIAGNOSIS — Z8619 Personal history of other infectious and parasitic diseases: Secondary | ICD-10-CM

## 2023-01-25 DIAGNOSIS — Z83438 Family history of other disorder of lipoprotein metabolism and other lipidemia: Secondary | ICD-10-CM

## 2023-01-25 DIAGNOSIS — Z7982 Long term (current) use of aspirin: Secondary | ICD-10-CM

## 2023-01-25 HISTORY — PX: CAROTID PTA/STENT INTERVENTION: CATH118231

## 2023-01-25 LAB — CREATININE, SERUM
Creatinine, Ser: 0.77 mg/dL (ref 0.44–1.00)
GFR, Estimated: >60 mL/min (ref >60)

## 2023-01-25 LAB — BUN: BUN: 19 mg/dL (ref 8–23)

## 2023-01-25 LAB — MRSA NEXT GEN BY PCR, NASAL: MRSA by PCR Next Gen: NOT DETECTED

## 2023-01-25 SURGERY — CAROTID PTA/STENT INTERVENTION
Anesthesia: Moderate Sedation | Laterality: Left

## 2023-01-25 MED ORDER — ACETAMINOPHEN 325 MG PO TABS: 325.0000 mg | ORAL_TABLET | ORAL | Status: DC | PRN

## 2023-01-25 MED ORDER — SODIUM CHLORIDE 0.9 % IV SOLN: INTRAVENOUS | Status: DC

## 2023-01-25 MED ORDER — PHENYLEPHRINE HCL-NACL 20-0.9 MG/250ML-% IV SOLN
25.0000 ug/min | INTRAVENOUS | Status: DC
  Administered 2023-01-25: 50 ug/min via INTRAVENOUS
  Administered 2023-01-25: 20 ug/min via INTRAVENOUS
  Administered 2023-01-26: 60 ug/min via INTRAVENOUS
  Administered 2023-01-26: 50 ug/min via INTRAVENOUS
  Filled 2023-01-25 (×3): qty 250

## 2023-01-25 MED ORDER — ONDANSETRON HCL 4 MG/2ML IJ SOLN
INTRAMUSCULAR | Status: AC
  Filled 2023-01-25: qty 2

## 2023-01-25 MED ORDER — HYDROCHLOROTHIAZIDE 25 MG PO TABS: 12.5000 mg | ORAL_TABLET | Freq: Every day | ORAL | Status: DC

## 2023-01-25 MED ORDER — FENTANYL CITRATE (PF) 100 MCG/2ML IJ SOLN
INTRAMUSCULAR | Status: AC
  Filled 2023-01-25: qty 2

## 2023-01-25 MED ORDER — LISINOPRIL 5 MG PO TABS: 20.0000 mg | ORAL_TABLET | Freq: Every day | ORAL | Status: DC

## 2023-01-25 MED ORDER — ANASTROZOLE 1 MG PO TABS
1.0000 mg | ORAL_TABLET | Freq: Every day | ORAL | Status: DC
  Administered 2023-01-26 – 2023-01-29 (×4): 1 mg via ORAL
  Filled 2023-01-25 (×4): qty 1

## 2023-01-25 MED ORDER — VITAMIN D 25 MCG (1000 UNIT) PO TABS
500.0000 [IU] | ORAL_TABLET | Freq: Every day | ORAL | Status: DC
  Administered 2023-01-26 – 2023-01-29 (×4): 500 [IU] via ORAL
  Filled 2023-01-25 (×5): qty 1

## 2023-01-25 MED ORDER — SODIUM CHLORIDE 0.9 % IV SOLN: 500.0000 mL | Freq: Once | INTRAVENOUS | Status: DC | PRN

## 2023-01-25 MED ORDER — VITAMIN B-12 1000 MCG PO TABS
1000.0000 ug | ORAL_TABLET | Freq: Every day | ORAL | Status: DC
  Administered 2023-01-26 – 2023-01-29 (×4): 1000 ug via ORAL
  Filled 2023-01-25 (×4): qty 1

## 2023-01-25 MED ORDER — FAMOTIDINE 20 MG PO TABS: 40.0000 mg | ORAL_TABLET | Freq: Once | ORAL | Status: DC | PRN

## 2023-01-25 MED ORDER — SODIUM CHLORIDE 0.9 % IV SOLN: 250.0000 mL | INTRAVENOUS | Status: DC

## 2023-01-25 MED ORDER — SODIUM CHLORIDE 0.9 % IV BOLUS
INTRAVENOUS | Status: DC | PRN
  Administered 2023-01-25: 500 mL via INTRAVENOUS

## 2023-01-25 MED ORDER — FAMOTIDINE 20 MG PO TABS: 20.0000 mg | ORAL_TABLET | Freq: Two times a day (BID) | ORAL | Status: DC

## 2023-01-25 MED ORDER — DIPHENHYDRAMINE HCL 50 MG/ML IJ SOLN: 50.0000 mg | Freq: Once | INTRAMUSCULAR | Status: DC | PRN

## 2023-01-25 MED ORDER — PHENYLEPHRINE 80 MCG/ML (10ML) SYRINGE FOR IV PUSH (FOR BLOOD PRESSURE SUPPORT)
PREFILLED_SYRINGE | INTRAVENOUS | Status: DC | PRN
  Administered 2023-01-25: 2 ug
  Administered 2023-01-25: 2 ug via INTRAVENOUS

## 2023-01-25 MED ORDER — MAGNESIUM SULFATE 2 GM/50ML IV SOLN: 2.0000 g | Freq: Every day | INTRAVENOUS | Status: DC | PRN

## 2023-01-25 MED ORDER — FENTANYL CITRATE PF 50 MCG/ML IJ SOSY
PREFILLED_SYRINGE | INTRAMUSCULAR | Status: AC
  Filled 2023-01-25: qty 1

## 2023-01-25 MED ORDER — HYDROCOD POLI-CHLORPHE POLI ER 10-8 MG/5ML PO SUER
ORAL | Status: DC | PRN
  Administered 2023-01-25: 5 mL via ORAL

## 2023-01-25 MED ORDER — PHENYLEPHRINE HCL-NACL 20-0.9 MG/250ML-% IV SOLN
INTRAVENOUS | Status: AC
  Administered 2023-01-25: 10 ug/min via INTRAVENOUS
  Filled 2023-01-25: qty 250

## 2023-01-25 MED ORDER — PHENYLEPHRINE HCL-NACL 20-0.9 MG/250ML-% IV SOLN: 0.0000 ug/min | INTRAVENOUS | Status: DC

## 2023-01-25 MED ORDER — VENLAFAXINE HCL ER 75 MG PO CP24
75.0000 mg | ORAL_CAPSULE | Freq: Three times a day (TID) | ORAL | Status: DC
  Administered 2023-01-26 – 2023-01-29 (×10): 75 mg via ORAL
  Filled 2023-01-25 (×12): qty 1

## 2023-01-25 MED ORDER — OXYCODONE-ACETAMINOPHEN 5-325 MG PO TABS: 1.0000 | ORAL_TABLET | ORAL | Status: DC | PRN

## 2023-01-25 MED ORDER — MAGNESIUM HYDROXIDE 400 MG/5ML PO SUSP: 30.0000 mL | Freq: Every day | ORAL | Status: DC | PRN

## 2023-01-25 MED ORDER — METHYLPREDNISOLONE SODIUM SUCC 125 MG IJ SOLR: 125.0000 mg | Freq: Once | INTRAMUSCULAR | Status: DC | PRN

## 2023-01-25 MED ORDER — MIDAZOLAM HCL 2 MG/2ML IJ SOLN
INTRAMUSCULAR | Status: DC | PRN
  Administered 2023-01-25: 1 mg via INTRAVENOUS
  Administered 2023-01-25: .5 mg via INTRAVENOUS
  Administered 2023-01-25: 1 mg via INTRAVENOUS

## 2023-01-25 MED ORDER — GUAIFENESIN-DM 100-10 MG/5ML PO SYRP: 15.0000 mL | ORAL_SOLUTION | ORAL | Status: DC | PRN

## 2023-01-25 MED ORDER — ALUM & MAG HYDROXIDE-SIMETH 200-200-20 MG/5ML PO SUSP: 15.0000 mL | ORAL | Status: DC | PRN

## 2023-01-25 MED ORDER — LISINOPRIL-HYDROCHLOROTHIAZIDE 20-12.5 MG PO TABS: 1.0000 | ORAL_TABLET | Freq: Every day | ORAL | Status: DC

## 2023-01-25 MED ORDER — VANCOMYCIN HCL IN DEXTROSE 1-5 GM/200ML-% IV SOLN
1000.0000 mg | Freq: Two times a day (BID) | INTRAVENOUS | Status: AC
  Administered 2023-01-25 – 2023-01-26 (×2): 1000 mg via INTRAVENOUS
  Filled 2023-01-25 (×3): qty 200

## 2023-01-25 MED ORDER — ATORVASTATIN CALCIUM 20 MG PO TABS
80.0000 mg | ORAL_TABLET | Freq: Every day | ORAL | Status: DC
  Administered 2023-01-26 – 2023-01-29 (×4): 80 mg via ORAL
  Filled 2023-01-25 (×4): qty 4

## 2023-01-25 MED ORDER — IODIXANOL 320 MG/ML IV SOLN
INTRAVENOUS | Status: DC | PRN
  Administered 2023-01-25: 75 mL

## 2023-01-25 MED ORDER — LABETALOL HCL 5 MG/ML IV SOLN: 10.0000 mg | INTRAVENOUS | Status: DC | PRN

## 2023-01-25 MED ORDER — AMLODIPINE BESYLATE 5 MG PO TABS: 5.0000 mg | ORAL_TABLET | Freq: Every day | ORAL | Status: DC

## 2023-01-25 MED ORDER — HYDROCOD POLI-CHLORPHE POLI ER 10-8 MG/5ML PO SUER
ORAL | Status: AC
  Filled 2023-01-25: qty 5

## 2023-01-25 MED ORDER — POTASSIUM CHLORIDE CRYS ER 20 MEQ PO TBCR: 20.0000 meq | EXTENDED_RELEASE_TABLET | Freq: Every day | ORAL | Status: DC | PRN

## 2023-01-25 MED ORDER — MIDAZOLAM HCL 2 MG/ML PO SYRP: 8.0000 mg | ORAL_SOLUTION | Freq: Once | ORAL | Status: DC | PRN

## 2023-01-25 MED ORDER — HYDROMORPHONE HCL 1 MG/ML IJ SOLN: 1.0000 mg | Freq: Once | INTRAMUSCULAR | Status: DC | PRN

## 2023-01-25 MED ORDER — CEFAZOLIN SODIUM-DEXTROSE 2-4 GM/100ML-% IV SOLN
INTRAVENOUS | Status: AC
  Administered 2023-01-25: 2 g via INTRAVENOUS
  Filled 2023-01-25: qty 100

## 2023-01-25 MED ORDER — GABAPENTIN 300 MG PO CAPS
600.0000 mg | ORAL_CAPSULE | Freq: Three times a day (TID) | ORAL | Status: DC
  Administered 2023-01-25 – 2023-01-29 (×9): 600 mg via ORAL
  Filled 2023-01-25 (×12): qty 2

## 2023-01-25 MED ORDER — ATROPINE SULFATE 1 MG/10ML IJ SOSY
PREFILLED_SYRINGE | INTRAMUSCULAR | Status: AC
  Filled 2023-01-25: qty 10

## 2023-01-25 MED ORDER — ATROPINE SULFATE 1 MG/ML IV SOLN
INTRAVENOUS | Status: DC | PRN
  Administered 2023-01-25: 1 mg via INTRAVENOUS

## 2023-01-25 MED ORDER — ZINC SULFATE 220 (50 ZN) MG PO CAPS
220.0000 mg | ORAL_CAPSULE | Freq: Every day | ORAL | Status: DC
  Administered 2023-01-26 – 2023-01-29 (×4): 220 mg via ORAL
  Filled 2023-01-25 (×4): qty 1

## 2023-01-25 MED ORDER — ONDANSETRON HCL 4 MG/2ML IJ SOLN: 4.0000 mg | Freq: Four times a day (QID) | INTRAMUSCULAR | Status: DC | PRN

## 2023-01-25 MED ORDER — MIDAZOLAM HCL 5 MG/5ML IJ SOLN
INTRAMUSCULAR | Status: AC
  Filled 2023-01-25: qty 5

## 2023-01-25 MED ORDER — CLOBETASOL PROPIONATE 0.05 % EX SOLN: 1.0000 | Freq: Every day | CUTANEOUS | Status: DC

## 2023-01-25 MED ORDER — NOREPINEPHRINE 4 MG/250ML-% IV SOLN
2.0000 ug/min | INTRAVENOUS | Status: DC
  Administered 2023-01-25 – 2023-01-26 (×2): 2 ug/min via INTRAVENOUS
  Filled 2023-01-25 (×2): qty 250

## 2023-01-25 MED ORDER — ONDANSETRON HCL 4 MG/2ML IJ SOLN
4.0000 mg | Freq: Four times a day (QID) | INTRAMUSCULAR | Status: DC | PRN
  Administered 2023-01-25: 4 mg via INTRAVENOUS

## 2023-01-25 MED ORDER — ACETAMINOPHEN 325 MG RE SUPP: 325.0000 mg | RECTAL | Status: DC | PRN

## 2023-01-25 MED ORDER — HYDRALAZINE HCL 20 MG/ML IJ SOLN: 5.0000 mg | INTRAMUSCULAR | Status: DC | PRN

## 2023-01-25 MED ORDER — FAMOTIDINE IN NACL 20-0.9 MG/50ML-% IV SOLN
20.0000 mg | Freq: Two times a day (BID) | INTRAVENOUS | Status: DC
  Administered 2023-01-25 – 2023-01-26 (×2): 20 mg via INTRAVENOUS
  Filled 2023-01-25 (×2): qty 50

## 2023-01-25 MED ORDER — ORAL CARE MOUTH RINSE: 15.0000 mL | OROMUCOSAL | Status: DC | PRN

## 2023-01-25 MED ORDER — ASPIRIN 81 MG PO TBEC
81.0000 mg | DELAYED_RELEASE_TABLET | Freq: Every day | ORAL | Status: DC
  Administered 2023-01-25 – 2023-01-29 (×5): 81 mg via ORAL
  Filled 2023-01-25 (×5): qty 1

## 2023-01-25 MED ORDER — METOPROLOL TARTRATE 5 MG/5ML IV SOLN: 2.0000 mg | INTRAVENOUS | Status: DC | PRN

## 2023-01-25 MED ORDER — HEPARIN SODIUM (PORCINE) 1000 UNIT/ML IJ SOLN
INTRAMUSCULAR | Status: AC
  Filled 2023-01-25: qty 10

## 2023-01-25 MED ORDER — CEFAZOLIN SODIUM-DEXTROSE 2-4 GM/100ML-% IV SOLN: 2.0000 g | INTRAVENOUS | Status: AC

## 2023-01-25 MED ORDER — PHENYLEPHRINE 80 MCG/ML (10ML) SYRINGE FOR IV PUSH (FOR BLOOD PRESSURE SUPPORT)
PREFILLED_SYRINGE | INTRAVENOUS | Status: AC
  Administered 2023-01-25: 2 ug
  Filled 2023-01-25: qty 10

## 2023-01-25 MED ORDER — ADULT MULTIVITAMIN W/MINERALS CH
1.0000 | ORAL_TABLET | Freq: Every day | ORAL | Status: DC
  Administered 2023-01-25 – 2023-01-29 (×5): 1 via ORAL
  Filled 2023-01-25 (×5): qty 1

## 2023-01-25 MED ORDER — PHENOL 1.4 % MT LIQD: 1.0000 | OROMUCOSAL | Status: DC | PRN

## 2023-01-25 MED ORDER — DOPAMINE-DEXTROSE 3.2-5 MG/ML-% IV SOLN
0.0000 ug/kg/min | INTRAVENOUS | Status: DC
  Administered 2023-01-25: 5 ug/kg/min via INTRAVENOUS

## 2023-01-25 MED ORDER — PHENYLEPHRINE 80 MCG/ML (10ML) SYRINGE FOR IV PUSH (FOR BLOOD PRESSURE SUPPORT): 80.0000 ug | PREFILLED_SYRINGE | Freq: Once | INTRAVENOUS | Status: DC | PRN

## 2023-01-25 MED ORDER — ALBUTEROL SULFATE (2.5 MG/3ML) 0.083% IN NEBU: 3.0000 mL | INHALATION_SOLUTION | Freq: Four times a day (QID) | RESPIRATORY_TRACT | Status: DC | PRN

## 2023-01-25 MED ORDER — FENTANYL CITRATE (PF) 100 MCG/2ML IJ SOLN
INTRAMUSCULAR | Status: DC | PRN
  Administered 2023-01-25 (×2): 25 ug via INTRAVENOUS
  Administered 2023-01-25: 50 ug via INTRAVENOUS

## 2023-01-25 MED ORDER — CLOPIDOGREL BISULFATE 75 MG PO TABS
75.0000 mg | ORAL_TABLET | Freq: Every day | ORAL | Status: DC
  Administered 2023-01-26 – 2023-01-29 (×4): 75 mg via ORAL
  Filled 2023-01-25 (×4): qty 1

## 2023-01-25 MED ORDER — CHLORHEXIDINE GLUCONATE CLOTH 2 % EX PADS
6.0000 | MEDICATED_PAD | Freq: Every day | CUTANEOUS | Status: DC
  Administered 2023-01-25 – 2023-01-27 (×3): 6 via TOPICAL

## 2023-01-25 MED ORDER — HEPARIN SODIUM (PORCINE) 1000 UNIT/ML IJ SOLN
INTRAMUSCULAR | Status: DC | PRN
  Administered 2023-01-25: 8000 [IU] via INTRAVENOUS

## 2023-01-25 MED ORDER — DOCUSATE SODIUM 100 MG PO CAPS
100.0000 mg | ORAL_CAPSULE | Freq: Every day | ORAL | Status: DC
  Administered 2023-01-26 – 2023-01-29 (×4): 100 mg via ORAL
  Filled 2023-01-25 (×5): qty 1

## 2023-01-25 SURGICAL SUPPLY — 28 items
BALLN LUTONIX AV 6X60X75 (BALLOONS) ×1
BALLN VIATRAC 5X20X135 (BALLOONS)
BALLN VIATRAC 5X30X135 (BALLOONS) ×1
BALLOON LUTONIX AV 6X60X75 (BALLOONS) IMPLANT
BALLOON VIATRAC 5X20X135 (BALLOONS) IMPLANT
BALLOON VIATRAC 5X30X135 (BALLOONS) IMPLANT
CATH BEACON 5 .035 100 JB2 TIP (CATHETERS) IMPLANT
CATH BEACON 5 .035 40 KMP TP (CATHETERS) IMPLANT
CATH BEACON 5 .038 40 KMP TP (CATHETERS) ×1
CATH G 5FX100 (CATHETERS) IMPLANT
CATH SIZING 5F 100CM .035 PIG (CATHETERS) IMPLANT
DEVICE EMBOSHIELD NAV6 4.0-7.0 (FILTER) IMPLANT
DEVICE SAFEGUARD 24CM (GAUZE/BANDAGES/DRESSINGS) IMPLANT
DEVICE STARCLOSE SE CLOSURE (Vascular Products) IMPLANT
DEVICE TORQUE .025-.038 (MISCELLANEOUS) IMPLANT
GLIDEWIRE ADV .035X180CM (WIRE) IMPLANT
GLIDEWIRE ANGLED SS 035X260CM (WIRE) IMPLANT
GUIDEWIRE VASC STIFF .038X260 (WIRE) IMPLANT
KIT CAROTID MANIFOLD (MISCELLANEOUS) IMPLANT
KIT ENCORE 26 ADVANTAGE (KITS) IMPLANT
PACK ANGIOGRAPHY (CUSTOM PROCEDURE TRAY) ×1 IMPLANT
SHEATH BRITE TIP 6FRX11 (SHEATH) IMPLANT
SHEATH NEURON MAX 6FR 80CM (SHEATH) IMPLANT
STENT XACT CAR 9-7X30X136 (Permanent Stent) IMPLANT
STENT XACT CAR 9X30X136 (Permanent Stent) IMPLANT
SYR MEDRAD MARK 7 150ML (SYRINGE) IMPLANT
WIRE GUIDERIGHT .035X150 (WIRE) IMPLANT
WIRE SUPRACORE 300CM (WIRE) IMPLANT

## 2023-01-25 NOTE — Op Note (Signed)
OPERATIVE NOTE DATE: 01/25/2023  PROCEDURE:  Ultrasound guidance for vascular access right femoral artery Right ileofemoral angiogram Angioplasty of the right common iliac artery with 6 mm x 6 cm Lutonix drug coated angioplasty balloon  Placement of a 9 mm proximal 7 mm distal 3 cm long exact stent and a 9 mm proximal by 9 mm distal by 3 cm long Exact stent extender with the use of the NAV-6 embolic protection device in the left carotid artery  PRE-OPERATIVE DIAGNOSIS: 1.  High-grade left carotid artery stenosis. 2.  Previous carotid endarterectomy  POST-OPERATIVE DIAGNOSIS:  Same as above with small right iliac artery aneurysm and high-grade stenosis of the right common iliac artery  SURGEON: Leotis Pain, MD  ASSISTANT(S): None  ANESTHESIA: local/MCS  ESTIMATED BLOOD LOSS: 25 cc  CONTRAST: 75 cc  FLUORO TIME: 1.3 low minutes  MODERATE CONSCIOUS SEDATION TIME:  Approximately 74 minutes using 2.5 mg of Versed and 100 mcg of Fentanyl  FINDING(S): 1.   80% left carotid artery stenosis 2.   90% right common iliac artery stenosis with small aneurysm or poststenotic dilatation which was marked after the stenosis  SPECIMEN(S):   none  INDICATIONS:   Patient is a 72 y.o. female who presents with high-grade left carotid artery stenosis.  The patient has a high lesion and carotid artery stenting was felt to be preferred to endarterectomy for that reason. I have completed Share Decision Making with Barbara Contreras prior to surgery.  Conversations included: -Discussion of all treatment options including carotid endarterectomy (CEA), CAS (which includes transcarotid artery revascularization (TCAR)), and optimal medical therapy (OMT)). -Explanation of risks and benefits for each option specific to Barbara Contreras. -Integration of clinical guidelines as it relates to the patient's history and co-morbidities -Discussion and incorporation of Barbara Contreras in choosing a treatment plan.   Risks and benefits were discussed and informed consent was obtained.   DESCRIPTION: After obtaining full informed written consent, the patient was brought back to the vascular suite and placed supine upon the table.  The patient received IV antibiotics prior to induction. Moderate conscious sedation was administered during a face to face encounter with the patient throughout the procedure with my supervision of the RN administering medicines and monitoring the patients vital signs and mental status throughout from the start of the procedure until the patient was taken to the recovery room.  After obtaining adequate anesthesia, the patient was prepped and draped in the standard fashion.   The right femoral artery was visualized with ultrasound and found to be widely patent. It was then accessed under direct ultrasound guidance without difficulty with a Seldinger needle. A permanent image was recorded. A J-wire was placed but would not traverse the right common iliac artery and we then placed a 6 French sheath. The patient was then heparinized and a total of 8000 units of intravenous heparin were given and an ACT was checked to confirm successful anticoagulation.  The pigtail catheter would not cross the right common iliac artery.  I then performed imaging through the right femoral sheath of the right iliac arteries and right femoral arteries.  There was about a 90% stenosis in the right common iliac artery with poststenotic dilatation that was essentially a small aneurysm.  Using an advantage wire and a Kumpe catheter I was able to cross the stenosis.  This was quite tight and to be able to treat anything in the neck,  I elected to perform angioplasty on this right common iliac artery stenosis.  6 mm diameter by 6 cm length Lutonix drug-coated angioplasty balloons inflated to 10 atm for 1 minute.  Following this there was still moderate  stenosis in the 40 to 50% range, but was improved and there was less stored energy and allowed [place] to manipulate catheters in the thoracic aorta.  A pigtail catheter was then placed into the ascending aorta. 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  There were no obvious proximal stenosis in the great vessels. I then selectively cannulated the left common carotid artery without difficulty with a JB2 catheter and advanced into the mid left common carotid artery.  This was tedious and difficult due to the marked tortuosity and reverse curve, but ultimately is able to get a wire up into the external iliac artery for improved purchase and sneak the JB2 catheter up.  Cervical and cerebral carotid angiography was then performed. There were no obvious intracranial filling defects with reasonable filling of both the anterior and middle cerebral arteries.  The distal internal carotid artery near the base of the skull had moderate disease in the 50 to 70% range that was diffuse and throughout its course. The carotid bifurcation demonstrated an approximately 80% stenosis in the distal common carotid artery and proximal internal carotid artery.  I then advanced into the external carotid artery with a Glidewire and the JB2 catheter and then exchanged for the a supra core wire and the Amplatz Super Stiff wire would not track due to the marked tortuosity in the thoracic portion. Over the Supracore wire, a 6 French neuron max sheath was placed into the mid common carotid artery. I then used the NAV-6  Embolic protection device and crossed the lesion and parked this in the distal internal carotid artery at the base of the skull.  I then selected a 9 mm proximal, 7 mm distal, 3 cm long exact stent. This did not entirely cross the lesion due to its long length of the lesion but this was the only length stent we had available in the 9 mm proximal 7 mm  distal size.  An additional 9 mm x 30 mm length exact stent extender was deployed proximally about 2 cm in the common carotid artery. A 5 mm diameter by 3 cm length balloon was used to post dilate the stent. Only about a 10% residual stenosis was present after angioplasty. Completion angiogram showed normal intracranial filling without new defects. At this point I elected to terminate the procedure. The sheath was removed and StarClose closure device was deployed in the right femoral artery with excellent hemostatic result. The patient was taken to the recovery room in stable condition having tolerated the procedure well.  COMPLICATIONS: none  CONDITION: stable  Leotis Pain 01/25/2023 4:22 PM   This note was created with Dragon Medical transcription system. Any errors in dictation are purely unintentional.

## 2023-01-25 NOTE — Interval H&P Note (Signed)
History and Physical Interval Note:  01/25/2023 12:52 PM  Barbara Contreras  has presented today for surgery, with the diagnosis of L Carotid Stent   ABBOTT  Carotid artery stenosis.  The various methods of treatment have been discussed with the patient and family. After consideration of risks, benefits and other options for treatment, the patient has consented to  Procedure(s): CAROTID PTA/STENT INTERVENTION (Left) as a surgical intervention.  The patient's history has been reviewed, patient examined, no change in status, stable for surgery.  I have reviewed the patient's chart and labs.  Questions were answered to the patient's satisfaction.     Leotis Pain

## 2023-01-25 NOTE — Progress Notes (Signed)
An USGPIV (ultrasound guided PIV) has been placed for short-term vasopressor infusion. A correctly placed ivWatch must be used when administering Vasopressors. Should this treatment be needed beyond 72 hours, central line access should be obtained.  It will be the responsibility of the bedside nurse to follow best practice to prevent extravasations.   

## 2023-01-25 NOTE — Progress Notes (Signed)
Received Pt from cath lab, S/P left Carotid stent placement x2 to ICU 11. Pt is awake alert and oriented x4. Pupils equal and reactive to light, pt moves all extremities x4. Doppler pulses to DP/PP to both left and right leg. Pt oriented to room and call light. Pt denies any major pain. PAD in place to right groin and C/D/I.

## 2023-01-25 NOTE — Progress Notes (Signed)
eLink Physician-Brief Progress Note Patient Name: Barbara Contreras DOB: [birth year] MRN: [id number]   Date of Service  01/25/2023  HPI/Events of Note  72 year old female that presents to the intensive care unit after angioplasty of the right common iliac artery and placement of stent with extender in the left carotid artery.  Placed on dopamine and phenylephrine since 4 PM with MAP goal greater than 65.  Postoperative fluids with normal saline.  Perioperative prophylactic antibiotics per primary team.  No focal neurological deficits.  No groin bleeding or discomfort.  Eating comfortably, speaking full sentences.    eICU Interventions  On 90 mcg of phenylephrine.  5 of dopamine.  Would prefer to switch to norepinephrine  Ultrasound-guided IV placement.  Norepinephrine with same MAP goal greater than 65.  Prefer to discontinue dopamine first then minimize norepinephrine second per primary team preference.  Add lactate for a.m. labs.     Intervention Category Minor Interventions: Clinical assessment - ordering diagnostic tests Evaluation Type: New Patient Evaluation  Barbara Contreras 01/25/2023, 7:26 PM

## 2023-01-26 ENCOUNTER — Encounter: Payer: Self-pay | Admitting: Vascular Surgery

## 2023-01-26 LAB — CBC
HCT: 37.3 % (ref 36.0–46.0)
Hemoglobin: 11.8 g/dL — ABNORMAL LOW (ref 12.0–15.0)
MCH: 30.5 pg (ref 26.0–34.0)
MCHC: 31.6 g/dL (ref 30.0–36.0)
MCV: 96.4 fL (ref 80.0–100.0)
Platelets: 178 10*3/uL (ref 150–400)
RBC: 3.87 MIL/uL (ref 3.87–5.11)
RDW: 13.7 % (ref 11.5–15.5)
WBC: 8.3 10*3/uL (ref 4.0–10.5)
nRBC: 0 % (ref 0.0–0.2)

## 2023-01-26 LAB — BASIC METABOLIC PANEL
Anion gap: 6 (ref 5–15)
BUN: 15 mg/dL (ref 8–23)
CO2: 29 mmol/L (ref 22–32)
Calcium: 8.3 mg/dL — ABNORMAL LOW (ref 8.9–10.3)
Chloride: 105 mmol/L (ref 98–111)
Creatinine, Ser: 0.93 mg/dL (ref 0.44–1.00)
GFR, Estimated: >60 mL/min (ref >60)
Glucose, Bld: 114 mg/dL — ABNORMAL HIGH (ref 70–99)
Potassium: 3.9 mmol/L (ref 3.5–5.1)
Sodium: 140 mmol/L (ref 135–145)

## 2023-01-26 MED ORDER — FAMOTIDINE 20 MG PO TABS
20.0000 mg | ORAL_TABLET | Freq: Two times a day (BID) | ORAL | Status: DC
  Administered 2023-01-26 – 2023-01-29 (×5): 20 mg via ORAL
  Filled 2023-01-26 (×6): qty 1

## 2023-01-26 NOTE — Progress Notes (Signed)
PHARMACIST - PHYSICIAN COMMUNICATION  CONCERNING: IV to Oral Route Change Policy  RECOMMENDATION: This patient is receiving famotidine by the intravenous route.  Based on criteria approved by the Pharmacy and Therapeutics Committee, the intravenous medication(s) is/are being converted to the equivalent oral dose form(s).  DESCRIPTION: These criteria include: The patient is eating (either orally or via tube) and/or has been taking other orally administered medications for a least 24 hours The patient has no evidence of active gastrointestinal bleeding or impaired GI absorption (gastrectomy, short bowel, patient on TNA or NPO).  If you have questions about this conversion, please contact the [place], [facility] 01/26/2023 3:09 PM

## 2023-01-26 NOTE — TOC Initial Note (Signed)
Transition of Care [facility]) - Initial/Assessment Note    Patient Details  Name: Barbara Contreras MRN: [id number] Date of Birth: Nov 17, 1951  Transition of Care [facility]) CM/SW Contact:    Shelbie Hutching, RN Phone Number: 01/26/2023, 10:23 AM  Clinical Narrative:                  Transition of Care [facility]) Screening Note   Patient Details  Name: Barbara Contreras Date of Birth: [birth year]   Transition of Care [facility]) CM/SW Contact:    Shelbie Hutching, RN Phone Number: 01/26/2023, 10:23 AM    Transition of Care Department [facility]) has reviewed patient and no TOC needs have been identified at this time. We will continue to monitor patient advancement through interdisciplinary progression rounds. If new patient transition needs arise, please place a TOC consult.          Patient Goals and CMS Choice            Expected Discharge Plan and Services                                              Prior Living Arrangements/Services                       Activities of Daily Living Home Assistive Devices/Equipment: Cane (specify quad or straight) ADL Screening (condition at time of admission) Patient's cognitive ability adequate to safely complete daily activities?: Yes Is the patient deaf or have difficulty hearing?: No Does the patient have difficulty seeing, even when wearing glasses/contacts?: Yes Does the patient have difficulty concentrating, remembering, or making decisions?: No Patient able to express need for assistance with ADLs?: Yes Does the patient have difficulty dressing or bathing?: No Independently performs ADLs?: Yes (appropriate for developmental age) Does the patient have difficulty walking or climbing stairs?: No Weakness of Legs: None Weakness of Arms/Hands: None  Permission Sought/Granted                  Emotional Assessment              Admission diagnosis:  Carotid stenosis, left [I65.22] Patient Active Problem List    Diagnosis Date Noted   Carotid stenosis, left 01/25/2023   Acute gastroenteritis 11/06/2022   Dyslipidemia 11/06/2022   Essential hypertension 11/06/2022   GERD without esophagitis 11/06/2022   Asthma, chronic 11/06/2022   Alcohol abuse 10/02/2022   AKI (acute kidney injury) ([place]) 10/02/2022   Closed fracture of rib of right side 10/02/2022   Closed L1 vertebral fracture ([place]) 10/02/2022   Closed L2 vertebral fracture ([place]) 10/02/2022   Fall 10/02/2022   Electrolyte abnormality 10/01/2022   Osteopenia 08/30/2022   Carcinoma of upper-outer quadrant of right breast in female, estrogen receptor positive ([place]) 08/03/2021   Carotid stenosis 08/13/2018   Tobacco use disorder 08/13/2018   Elevated troponin 06/16/2017   Syncope and collapse 03/09/2016   HTN (hypertension) 03/09/2016   HLD (hyperlipidemia) 03/09/2016   Depression 03/09/2016   Heart murmur 03/09/2016   PCP:  Valera Castle, MD Pharmacy:   [id number] [place] [facility] [address]: [phone]: [phone] [id number] [place]: [phone]: [phone]  Social Determinants of Health (SDOH) Social History: SDOH Screenings   Food Insecurity: No Food Insecurity (11/07/2022)  Housing: Low Risk  (11/07/2022)  Transportation Needs: No Transportation Needs (11/07/2022)  Utilities: Not At Risk (11/07/2022)  Tobacco Use: High Risk (01/25/2023)   SDOH Interventions:     Readmission Risk Interventions     No data to display

## 2023-01-27 LAB — COMPREHENSIVE METABOLIC PANEL
ALT: 13 U/L (ref 0–44)
AST: 14 U/L — ABNORMAL LOW (ref 15–41)
Albumin: 3.1 g/dL — ABNORMAL LOW (ref 3.5–5.0)
Alkaline Phosphatase: 40 U/L (ref 38–126)
Anion gap: 3 — ABNORMAL LOW (ref 5–15)
BUN: 16 mg/dL (ref 8–23)
CO2: 29 mmol/L (ref 22–32)
Calcium: 8.3 mg/dL — ABNORMAL LOW (ref 8.9–10.3)
Chloride: 108 mmol/L (ref 98–111)
Creatinine, Ser: 0.83 mg/dL (ref 0.44–1.00)
GFR, Estimated: >60 mL/min (ref >60)
Glucose, Bld: 119 mg/dL — ABNORMAL HIGH (ref 70–99)
Potassium: 4.2 mmol/L (ref 3.5–5.1)
Sodium: 140 mmol/L (ref 135–145)
Total Bilirubin: 0.6 mg/dL (ref 0.3–1.2)
Total Protein: 5.5 g/dL — ABNORMAL LOW (ref 6.5–8.1)

## 2023-01-27 LAB — CBC
HCT: 34.3 % — ABNORMAL LOW (ref 36.0–46.0)
Hemoglobin: 10.8 g/dL — ABNORMAL LOW (ref 12.0–15.0)
MCH: 30.6 pg (ref 26.0–34.0)
MCHC: 31.5 g/dL (ref 30.0–36.0)
MCV: 97.2 fL (ref 80.0–100.0)
Platelets: 118 10*3/uL — ABNORMAL LOW (ref 150–400)
RBC: 3.53 MIL/uL — ABNORMAL LOW (ref 3.87–5.11)
RDW: 13.6 % (ref 11.5–15.5)
WBC: 6.3 10*3/uL (ref 4.0–10.5)
nRBC: 0 % (ref 0.0–0.2)

## 2023-01-27 NOTE — Progress Notes (Signed)
2 Days Post-Op   Subjective/Chief Complaint: Off Levophed and Dopamine since early this morning. Currently stable. No complaints   Objective: Vital signs in last 24 hours: Temp:  [98 F (36.7 C)-98.7 F (37.1 C)] 98 F (36.7 C) (02/24 0400) Pulse Rate:  [28-158] 77 (02/24 0800) Resp:  [11-36] 15 (02/24 0800) BP: (68-127)/(33-107) 107/50 (02/24 0800) SpO2:  [92 %-100 %] 98 % (02/24 0800) Last BM Date : 01/24/23  Intake/Output from previous day: 02/23 0701 - 02/24 0700 In: 2607.9 [I.V.:2357.8; IV Piggyback:250.1] Out: 1200 [Urine:1200] Intake/Output this shift: Total I/O In: 308.3 [I.V.:308.3] Out: -   General appearance: alert and no distress Cardio: regular rate and rhythm Extremities: Lower extremities warm, +DP by doppler, ecchymosis of RIGHT groin, soft, no hematoma Neurologic: Alert and oriented X 3, normal strength and tone. Normal symmetric reflexes. Normal coordination and gait  Lab Results:  Recent Labs    01/26/23 0540  WBC 8.3  HGB 11.8*  HCT 37.3  PLT 178   BMET Recent Labs    01/25/23 1239 01/26/23 0540  NA  --  140  K  --  3.9  CL  --  105  CO2  --  29  GLUCOSE  --  114*  BUN 19 15  CREATININE 0.77 0.93  CALCIUM  --  8.3*   PT/INR No results for input(s): "LABPROT", "INR" in the last 72 hours. ABG No results for input(s): "PHART", "HCO3" in the last 72 hours.  Invalid input(s): "PCO2", "PO2"  Studies/Results: PERIPHERAL VASCULAR CATHETERIZATION  Result Date: 01/25/2023 See surgical note for result.   Anti-infectives: Anti-infectives (From admission, onward)    Start     Dose/Rate Route Frequency Ordered Stop   01/25/23 1745  vancomycin (VANCOCIN) IVPB 1000 mg/200 mL premix        1,000 mg 200 mL/hr over 60 Minutes Intravenous Every 12 hours 01/25/23 1730 01/26/23 0940   01/25/23 0044  ceFAZolin (ANCEF) IVPB 2g/100 mL premix        2 g 200 mL/hr over 30 Minutes Intravenous 30 min pre-op 01/25/23 0044 01/25/23 1531        Assessment/Plan: s/p Procedure(s): CAROTID PTA/STENT INTERVENTION (Left) RIGHT Common Iliac stenting POD #2  Monitor BP and HR Keep in ICU Encourage PO Monitor RIGHT groin ecchymosis  LOS: 2 days    Jamesetta So A 01/27/2023

## 2023-01-28 ENCOUNTER — Inpatient Hospital Stay: Payer: 59

## 2023-01-28 DIAGNOSIS — I6522 Occlusion and stenosis of left carotid artery: Secondary | ICD-10-CM

## 2023-01-28 LAB — CBC WITH DIFFERENTIAL/PLATELET
Abs Immature Granulocytes: 0.01 10*3/uL (ref 0.00–0.07)
Basophils Absolute: 0 10*3/uL (ref 0.0–0.1)
Basophils Relative: 0 %
Eosinophils Absolute: 0.2 10*3/uL (ref 0.0–0.5)
Eosinophils Relative: 3 %
HCT: 33.7 % — ABNORMAL LOW (ref 36.0–46.0)
Hemoglobin: 10.8 g/dL — ABNORMAL LOW (ref 12.0–15.0)
Immature Granulocytes: 0 %
Lymphocytes Relative: 19 %
Lymphs Abs: 1.2 10*3/uL (ref 0.7–4.0)
MCH: 30.9 pg (ref 26.0–34.0)
MCHC: 32 g/dL (ref 30.0–36.0)
MCV: 96.3 fL (ref 80.0–100.0)
Monocytes Absolute: 0.4 10*3/uL (ref 0.1–1.0)
Monocytes Relative: 7 %
Neutro Abs: 4.5 10*3/uL (ref 1.7–7.7)
Neutrophils Relative %: 71 %
Platelets: 117 10*3/uL — ABNORMAL LOW (ref 150–400)
RBC: 3.5 MIL/uL — ABNORMAL LOW (ref 3.87–5.11)
RDW: 13.4 % (ref 11.5–15.5)
WBC: 6.4 10*3/uL (ref 4.0–10.5)
nRBC: 0 % (ref 0.0–0.2)

## 2023-01-28 LAB — BASIC METABOLIC PANEL
Anion gap: 4 — ABNORMAL LOW (ref 5–15)
BUN: 12 mg/dL (ref 8–23)
CO2: 28 mmol/L (ref 22–32)
Calcium: 8.5 mg/dL — ABNORMAL LOW (ref 8.9–10.3)
Chloride: 105 mmol/L (ref 98–111)
Creatinine, Ser: 0.74 mg/dL (ref 0.44–1.00)
GFR, Estimated: >60 mL/min (ref >60)
Glucose, Bld: 105 mg/dL — ABNORMAL HIGH (ref 70–99)
Potassium: 4 mmol/L (ref 3.5–5.1)
Sodium: 137 mmol/L (ref 135–145)

## 2023-01-28 NOTE — Progress Notes (Signed)
3 Days Post-Op   Subjective/Chief Complaint: Per nursing had brief episode of what appeared to be AFIB- quickly went back to sinus. Also had brief bradycardia to 48-49. Patient states she has some shortness of breath, on 2L . Otherwise without complaint.   Objective: Vital signs in last 24 hours: Temp:  [98.1 F (36.7 C)-99.6 F (37.6 C)] 98.4 F (36.9 C) (02/25 0400) Pulse Rate:  [52-76] 55 (02/25 0700) Resp:  [14-26] 17 (02/25 0800) BP: (93-143)/(42-80) 133/80 (02/25 0800) SpO2:  [92 %-99 %] 99 % (02/25 0700) FiO2 (%):  [28 %] 28 % (02/25 0700) Last BM Date : 01/24/23  Intake/Output from previous day: 02/24 0701 - 02/25 0700 In: 1476 [P.O.:660; I.V.:816] Out: 1150 [Urine:1150] Intake/Output this shift: No intake/output data recorded.  General appearance: alert and no distress Resp: clear to auscultation bilaterally Cardio: regular rate and rhythm Extremities: Lower extremities warm, +DP doppler, stable ecchymosis of right groin- soft, no hematoma Neurologic: Alert and oriented X 3, normal strength and tone. Normal symmetric reflexes. Normal coordination and gait  Lab Results:  Recent Labs    01/27/23 0958 01/28/23 0636  WBC 6.3 6.4  HGB 10.8* 10.8*  HCT 34.3* 33.7*  PLT 118* 117*   BMET Recent Labs    01/27/23 0958 01/28/23 0636  NA 140 137  K 4.2 4.0  CL 108 105  CO2 29 28  GLUCOSE 119* 105*  BUN 16 12  CREATININE 0.83 0.74  CALCIUM 8.3* 8.5*   PT/INR No results for input(s): "LABPROT", "INR" in the last 72 hours. ABG No results for input(s): "PHART", "HCO3" in the last 72 hours.  Invalid input(s): "PCO2", "PO2"  Studies/Results: No results found.  Anti-infectives: Anti-infectives (From admission, onward)    Start     Dose/Rate Route Frequency Ordered Stop   01/25/23 1745  vancomycin (VANCOCIN) IVPB 1000 mg/200 mL premix        1,000 mg 200 mL/hr over 60 Minutes Intravenous Every 12 hours 01/25/23 1730 01/26/23 0940   01/25/23 0044   ceFAZolin (ANCEF) IVPB 2g/100 mL premix        2 g 200 mL/hr over 30 Minutes Intravenous 30 min pre-op 01/25/23 0044 01/25/23 1531       Assessment/Plan: s/p Procedure(s): CAROTID PTA/STENT INTERVENTION (Left) RIGHT Common Iliac stenting POD #3  OK to transition to stepdown today Obtain EKG, CXR Encourage IS ASA/Plavix   LOS: 3 days    Jamesetta So A 01/28/2023

## 2023-01-28 NOTE — Progress Notes (Signed)
Patient alert and oriented, on 2L [place]. BP stable, sinus brady. HR non sustained in the upper 40s. MD made aware. Oxygen wean tried this morning, patient desats to mid 32s. Patient placed back on 2L [place]. Patient continues to remove nasal cannula and O2 sensor, replaced and education provided . Will continue to monitor. Updates provided to family by phone.  Family at bedside. Updates given. Family report patients drinks several glasses to a bottle daily. Patients report drinking "a few glasses" Education provided.  Will continue to monitor.  Bruising extended beyond border at pubic region. Marked and MD notified.

## 2023-01-29 ENCOUNTER — Inpatient Hospital Stay: Payer: 59

## 2023-01-29 LAB — COMPREHENSIVE METABOLIC PANEL
ALT: 12 U/L (ref 0–44)
AST: 14 U/L — ABNORMAL LOW (ref 15–41)
Albumin: 3 g/dL — ABNORMAL LOW (ref 3.5–5.0)
Alkaline Phosphatase: 43 U/L (ref 38–126)
Anion gap: 5 (ref 5–15)
BUN: 11 mg/dL (ref 8–23)
CO2: 32 mmol/L (ref 22–32)
Calcium: 8.7 mg/dL — ABNORMAL LOW (ref 8.9–10.3)
Chloride: 101 mmol/L (ref 98–111)
Creatinine, Ser: 0.7 mg/dL (ref 0.44–1.00)
GFR, Estimated: >60 mL/min (ref >60)
Glucose, Bld: 112 mg/dL — ABNORMAL HIGH (ref 70–99)
Potassium: 3.7 mmol/L (ref 3.5–5.1)
Sodium: 138 mmol/L (ref 135–145)
Total Bilirubin: 0.7 mg/dL (ref 0.3–1.2)
Total Protein: 5.6 g/dL — ABNORMAL LOW (ref 6.5–8.1)

## 2023-01-29 LAB — CBC
HCT: 34 % — ABNORMAL LOW (ref 36.0–46.0)
Hemoglobin: 11.1 g/dL — ABNORMAL LOW (ref 12.0–15.0)
MCH: 30.6 pg (ref 26.0–34.0)
MCHC: 32.6 g/dL (ref 30.0–36.0)
MCV: 93.7 fL (ref 80.0–100.0)
Platelets: 116 10*3/uL — ABNORMAL LOW (ref 150–400)
RBC: 3.63 MIL/uL — ABNORMAL LOW (ref 3.87–5.11)
RDW: 13.1 % (ref 11.5–15.5)
WBC: 6.2 10*3/uL (ref 4.0–10.5)
nRBC: 0 % (ref 0.0–0.2)

## 2023-01-29 MED ORDER — FUROSEMIDE 10 MG/ML IJ SOLN
20.0000 mg | Freq: Once | INTRAMUSCULAR | Status: AC
  Administered 2023-01-29: 20 mg via INTRAVENOUS
  Filled 2023-01-29: qty 2

## 2023-01-29 NOTE — Progress Notes (Addendum)
2L O2 ordered for bedside delivery via [employer], treatment team aware. No further dc needs identified at this time. [employer] will complete home set up with patient this evening, patient will receive 2L O2 to bedside and be transported home via private vehicle. [employer] to coordinate home set up with patient.    Kelby Fam, [place], MSW, [place]

## 2023-01-29 NOTE — Progress Notes (Signed)
Patient ambulated around unit, oxygen level dropped down to 85%. MD made aware. Continue to assess.

## 2023-01-29 NOTE — Progress Notes (Signed)
Patient instructed to call tomorrow to schedule a follow up appointment with her PCP. Office closed and Network engineer unable to schedule. Follow up with Dr Lucky Cowboy scheduled. Patient instructed about signs and symptoms of infection and when to call doctor. Groin intact with ecchymosis, no bleeding or hematoma. Doppler bilateral pulses. Patient being discharged home with oxygen.

## 2023-01-29 NOTE — Progress Notes (Signed)
  Progress Note    01/29/2023 10:34 AM 4 Days Post-Op  Subjective: Barbara Contreras is a 72 year old female postop day 4 from left carotid angioplasty with stent placement and right iliofemoral angiogram with angioplasty and stent placement to the right iliac artery.  This morning on exam patient states she remains short of breath and is requiring 2 to 3 L of nasal cannula oxygen.  Patient also noted to have a very productive cough.  Patient remains afebrile.  Vitals all remained stable.  No other complaints overnight.  Will evaluate patient's hypoxia today.   Vitals:   01/29/23 0734 01/29/23 0800  BP: 134/77 (!) 142/87  Pulse: 72 67  Resp: 20 12  Temp:    SpO2: 97% 100%   Physical Exam: Cardiac:  Irregular rhythm in and out of AFIB and sinus rhythm. No murmurs or gallops noted Lungs:  Very course throughout. Non productive cough. 2-3 Liters [place] saturations  97-98% Incisions:  Right groin with dressing. Clean dry and intact, without hematoma or seroma.  Extremities:  Right lower extremity is warm to touch and post tibial pulse is palpable.  Abdomen:  Positive bowel sounds, soft, flat, non tender and non distended Neurologic: AAOX3 this morning. Answers all questions appropriately.   CBC    Component Value Date/Time   WBC 6.2 01/29/2023 0502   RBC 3.63 (L) 01/29/2023 0502   HGB 11.1 (L) 01/29/2023 0502   HCT 34.0 (L) 01/29/2023 0502   PLT 116 (L) 01/29/2023 0502   MCV 93.7 01/29/2023 0502   MCH 30.6 01/29/2023 0502   MCHC 32.6 01/29/2023 0502   RDW 13.1 01/29/2023 0502   LYMPHSABS 1.2 01/28/2023 0636   MONOABS 0.4 01/28/2023 0636   EOSABS 0.2 01/28/2023 0636   BASOSABS 0.0 01/28/2023 0636    BMET    Component Value Date/Time   NA 138 01/29/2023 0502   K 3.7 01/29/2023 0502   CL 101 01/29/2023 0502   CO2 32 01/29/2023 0502   GLUCOSE 112 (H) 01/29/2023 0502   BUN 11 01/29/2023 0502   CREATININE 0.70 01/29/2023 0502   CALCIUM 8.7 (L) 01/29/2023 0502   GFRNONAA >60  01/29/2023 0502   GFRAA >60 06/17/2017 0302    INR    Component Value Date/Time   INR 1.0 08/03/2021 1504     Intake/Output Summary (Last 24 hours) at 01/29/2023 1034 Last data filed at 01/28/2023 1800 Gross per 24 hour  Intake 480 ml  Output --  Net 480 ml     Assessment/Plan:  72 y.o. female is s/p eft carotid angioplasty with stent placement and right iliofemoral angiogram with angioplasty and stent placement to the right iliac artery. 4 Days Post-Op   PLAN:  Repeat chest xray this morning and evaluate Lasix 20 mg IVP X 1 for hypoxia  Incentive Spirometry with flutter valve.  Ambulate patient multiple time a day. Wean off Oxygen Patient wishes to go home. If patient can clear lung atelectasis and remain off oxygen while ambulating discharge is possible this afternoon. Will reevaluate later today.   DVT prophylaxis:  ASA 81 mg daily, Plavix 75 mg daily,    Wilhelmine Krogstad R Muriah Harsha [facility] 01/29/2023 10:34 AM

## 2023-01-29 NOTE — Progress Notes (Signed)
SATURATION QUALIFICATIONS: (This note is used to comply with regulatory documentation for home oxygen)  Patient Saturations on Room Air at Rest = 87-88%  Patient Saturations on Room Air while Ambulating = 85%  Patient Saturations on 2 Liters of oxygen while Ambulating = 85%  Please briefly explain why patient needs home oxygen:Patient desaturates while ambulating. She also has a HX of Emphysema and asthma.

## 2023-01-29 NOTE — Progress Notes (Signed)
Patient being discharged home. [employer] [facility] at bedside. They went over the equipment with patient. She did not have any questions regarding equipment. Representative stated that they will be coming to her home to setup home equipment. Patient did have questions regarding what she needed to do when she had to run errands. Per Claudia Desanctis NP, patient needs to follow up with her primary care physician. At this time patient had no further questions. Went over discharge instructions and appointments.

## 2023-01-29 NOTE — Discharge Summary (Signed)
[facility]    Discharge Summary    Patient ID:  Barbara Contreras MRN: [id number] DOB/AGE: Sep 24, 1951 72 y.o.  Admit date: 01/25/2023 Discharge date: 01/29/2023 Date of Surgery: 01/25/2023 Surgeon: Surgeon(s): Algernon Huxley, MD  Admission Diagnosis: Carotid stenosis, left [I65.22]  Discharge Diagnoses:  Carotid stenosis, left [I65.22]  Secondary Diagnoses: Past Medical History:  Diagnosis Date   Arthritis    Asthma    Back pain    Breast cancer ([place]) 2022   Carotid artery stenosis    Carotid stenosis    Coronary artery disease    Depression    Emphysema of lung (HCC)    GERD (gastroesophageal reflux disease)    Heart murmur    Hip pain    History of Lyme disease    HLD (hyperlipidemia)    Hyperlipemia    Hypertension    IBS (irritable bowel syndrome)    IBS (irritable bowel syndrome)    Lyme disease    Pre-diabetes    Sciatica    Sciatica    Seizures (HCC)    Tremor    Vitamin D deficiency    Vitamin D deficiency     Procedure(s): CAROTID PTA/STENT INTERVENTION  Discharged Condition: good  HPI:  Barbara Contreras is a 72 year old female with a past medical history significant for arthritis, asthma, breast cancer, carotid artery stenosis, CAD, depression, emphysema, GERD, hyperlipidemia, HTN, IBS, and prediabetes.  She is now postop day 4 and due to her pulmonary history has significant atelectasis which requires her to go home on 2 L of nasal cannula oxygen.  Patient was given Lasix 20 mg IV this morning.  She diuresed very well.  Patient states that she is breathing much better.  She does desaturate though with exertion and again this is related to her emphysema and asthma.  Patient will be discharged home on aspirin 81 mg daily, Plavix 75 mg daily, and Lipitor 80 mg daily.  Patient's vitals all remained stable.  Patient has no complaints.  Patient wishes to go home this afternoon.  Patient stable to be discharged.  Hospital Course:   Barbara Contreras is a 72 y.o. female is S/P Left carotid angioplasty and stent placement  and right iliofemoral angiogram with angioplasty and stent placement to the right iliac artery.   Extubated: POD # 0 Physical Exam:  Alert notes x3, no acute distress Face: Symmetrical.  Tongue is midline. Neck: Trachea is midline.  No swelling or bruising. Cardiovascular: Regular rate and rhythm Pulmonary: Clear to auscultation bilaterally Abdomen: Soft, nontender, nondistended Right groin access: Clean dry and intact.  No swelling or drainage noted. No hematoma or Seroma Noted. Left lower extremity: Thigh soft.  Calf soft.  Extremities warm distally toes.  Hard to palpate pedal pulses however the foot is warm is her good capillary refill. Right lower extremity: Thigh soft.  Calf soft.  Extremities warm distally toes.  Hard to palpate pedal pulses however the foot is warm is her good capillary refill. Neurological: No deficits noted   Post-op wounds:  clean, dry, intact or healing well  Pt. Ambulating, voiding and taking PO diet without difficulty. Pt pain controlled with PO pain meds.  Labs:  As below  Complications: none  Consults:    Significant Diagnostic Studies: CBC Lab Results  Component Value Date   WBC 6.2 01/29/2023   HGB 11.1 (L) 01/29/2023   HCT 34.0 (L) 01/29/2023   MCV 93.7 01/29/2023   PLT 116 (L) 01/29/2023  BMET    Component Value Date/Time   NA 138 01/29/2023 0502   K 3.7 01/29/2023 0502   CL 101 01/29/2023 0502   CO2 32 01/29/2023 0502   GLUCOSE 112 (H) 01/29/2023 0502   BUN 11 01/29/2023 0502   CREATININE 0.70 01/29/2023 0502   CALCIUM 8.7 (L) 01/29/2023 0502   GFRNONAA >60 01/29/2023 0502   GFRAA >60 06/17/2017 0302   COAG Lab Results  Component Value Date   INR 1.0 08/03/2021     Disposition:  Discharge to :Home  Allergies as of 01/29/2023       Reactions   Morphine And Related Itching   Clindamycin Other (See Comments)    Erythromycin    Other reaction(s): Other (see comments)   Other    Other reaction(s): Other (See Comments) Redness and itching   Penicillins Other (See Comments)   TOLERATED CEFAZOLIN PRIOR Reaction: unknown Has patient had a PCN reaction causing immediate rash, facial/tongue/throat swelling, SOB or lightheadedness with hypotension: Unknown Has patient had a PCN reaction causing severe rash involving mucus membranes or skin necrosis: Unknown Has patient had a PCN reaction that required hospitalization: Unknown Has patient had a PCN reaction occurring within the last 10 years: no If all of the above answers are "NO", then may proceed with Cephalosporin use.   Promethazine-phenylephrine Other (See Comments)   Muscle spasm   Tape Other (See Comments)   Redness and itching        Medication List     TAKE these medications    albuterol 108 (90 Base) MCG/ACT inhaler Commonly known as: VENTOLIN HFA Inhale 1-2 puffs into the lungs every 6 (six) hours as needed for wheezing or shortness of breath.   amLODipine 5 MG tablet Commonly known as: NORVASC Take 5 mg by mouth daily.   anastrozole 1 MG tablet Commonly known as: ARIMIDEX TAKE 1 TABLET BY MOUTH EVERY DAY   aspirin EC 81 MG tablet Take 81 mg by mouth daily.   atorvastatin 80 MG tablet Commonly known as: LIPITOR Take 80 mg by mouth daily.   baclofen 10 MG tablet Commonly known as: LIORESAL Take 5-10 mg by mouth at bedtime as needed for muscle spasms.   calcium carbonate 750 MG chewable tablet Commonly known as: TUMS EX Chew 1 tablet by mouth daily as needed for heartburn.   clobetasol 0.05 % external solution Commonly known as: TEMOVATE Apply 1 Application topically daily.   clopidogrel 75 MG tablet Commonly known as: PLAVIX Take 1 tablet (75 mg total) by mouth daily.   cyanocobalamin 1000 MCG tablet Commonly known as: VITAMIN B12 Take 1,000 mcg by mouth daily.   dicyclomine 10 MG capsule Commonly known as:  BENTYL Take 10 mg by mouth in the morning and at bedtime.   docusate sodium 100 MG capsule Commonly known as: COLACE Take 100 mg by mouth 2 (two) times daily.   famotidine 20 MG tablet Commonly known as: PEPCID Take 20 mg by mouth 2 (two) times daily.   gabapentin 600 MG tablet Commonly known as: NEURONTIN Take 600 mg by mouth 3 (three) times daily.   ketoconazole 2 % shampoo Commonly known as: NIZORAL Apply 1 application topically once a week.   lisinopril-hydrochlorothiazide 20-12.5 MG tablet Commonly known as: ZESTORETIC Take 1 tablet by mouth in the morning and at bedtime. Hold until you see your doctor   multivitamin with minerals Tabs tablet Take 1 tablet by mouth daily.   venlafaxine XR 75 MG 24 hr capsule Commonly  known as: EFFEXOR-XR Take 75 mg by mouth 3 (three) times daily.   Vitamin D3 10 MCG (400 UNIT) Tabs tablet Generic drug: cholecalciferol Take 400 Units by mouth daily.   ZINC 15 PO Take 15 mg by mouth daily.               Durable Medical Equipment  (From admission, onward)           Start     Ordered   01/29/23 1337  For home use only DME oxygen  Once       Question Answer Comment  Length of Need 6 Months   Mode or (Route) Nasal cannula   Liters per Minute 2   Frequency Continuous (stationary and portable oxygen unit needed)   Oxygen conserving device Yes   Oxygen delivery system Gas      01/29/23 1337           Verbal and written Discharge instructions given to the patient. Wound care per Discharge AVS  Follow-up Information     Dew, Erskine Squibb, MD Follow up in 1 month(s).   Specialties: Vascular Surgery, Radiology, Interventional Cardiology Why: Eulogio Ditch with Left Carotid  Duplex Ultrasound and Right Common Iliac Duplex Ultrasound with ABI Contact information: [address] [place] [address] [phone]                 Signed: Drema Pry, NP  01/29/2023, 2:00 PM

## 2023-01-30 LAB — POCT ACTIVATED CLOTTING TIME: Activated Clotting Time: 320 seconds

## 2023-02-06 ENCOUNTER — Inpatient Hospital Stay: Payer: 59 | Attending: Internal Medicine

## 2023-02-06 ENCOUNTER — Inpatient Hospital Stay (HOSPITAL_BASED_OUTPATIENT_CLINIC_OR_DEPARTMENT_OTHER): Payer: 59 | Admitting: Internal Medicine

## 2023-02-06 DIAGNOSIS — F17210 Nicotine dependence, cigarettes, uncomplicated: Secondary | ICD-10-CM | POA: Diagnosis not present

## 2023-02-06 DIAGNOSIS — Z923 Personal history of irradiation: Secondary | ICD-10-CM | POA: Diagnosis not present

## 2023-02-06 DIAGNOSIS — Z170 Estrogen receptor positive status [ER+]: Secondary | ICD-10-CM

## 2023-02-06 DIAGNOSIS — M8580 Other specified disorders of bone density and structure, unspecified site: Secondary | ICD-10-CM | POA: Diagnosis not present

## 2023-02-06 DIAGNOSIS — Z79899 Other long term (current) drug therapy: Secondary | ICD-10-CM | POA: Diagnosis not present

## 2023-02-06 DIAGNOSIS — C50411 Malignant neoplasm of upper-outer quadrant of right female breast: Secondary | ICD-10-CM | POA: Insufficient documentation

## 2023-02-06 DIAGNOSIS — Z7982 Long term (current) use of aspirin: Secondary | ICD-10-CM | POA: Insufficient documentation

## 2023-02-06 DIAGNOSIS — Z79811 Long term (current) use of aromatase inhibitors: Secondary | ICD-10-CM | POA: Diagnosis not present

## 2023-02-06 DIAGNOSIS — Z7902 Long term (current) use of antithrombotics/antiplatelets: Secondary | ICD-10-CM | POA: Insufficient documentation

## 2023-02-06 LAB — COMPREHENSIVE METABOLIC PANEL
ALT: 15 U/L (ref 0–44)
AST: 16 U/L (ref 15–41)
Albumin: 3.7 g/dL (ref 3.5–5.0)
Alkaline Phosphatase: 65 U/L (ref 38–126)
Anion gap: 6 (ref 5–15)
BUN: 19 mg/dL (ref 8–23)
CO2: 31 mmol/L (ref 22–32)
Calcium: 9.5 mg/dL (ref 8.9–10.3)
Chloride: 101 mmol/L (ref 98–111)
Creatinine, Ser: 1.05 mg/dL — ABNORMAL HIGH (ref 0.44–1.00)
GFR, Estimated: 56 mL/min — ABNORMAL LOW (ref >60)
Glucose, Bld: 74 mg/dL (ref 70–99)
Potassium: 4.7 mmol/L (ref 3.5–5.1)
Sodium: 138 mmol/L (ref 135–145)
Total Bilirubin: 0.5 mg/dL (ref 0.3–1.2)
Total Protein: 7 g/dL (ref 6.5–8.1)

## 2023-02-06 LAB — CBC WITH DIFFERENTIAL/PLATELET
Abs Immature Granulocytes: 0.04 10*3/uL (ref 0.00–0.07)
Basophils Absolute: 0.1 10*3/uL (ref 0.0–0.1)
Basophils Relative: 1 %
Eosinophils Absolute: 0.2 10*3/uL (ref 0.0–0.5)
Eosinophils Relative: 3 %
HCT: 40.4 % (ref 36.0–46.0)
Hemoglobin: 12.8 g/dL (ref 12.0–15.0)
Immature Granulocytes: 1 %
Lymphocytes Relative: 21 %
Lymphs Abs: 1.4 10*3/uL (ref 0.7–4.0)
MCH: 30 pg (ref 26.0–34.0)
MCHC: 31.7 g/dL (ref 30.0–36.0)
MCV: 94.6 fL (ref 80.0–100.0)
Monocytes Absolute: 0.8 10*3/uL (ref 0.1–1.0)
Monocytes Relative: 12 %
Neutro Abs: 4.2 10*3/uL (ref 1.7–7.7)
Neutrophils Relative %: 62 %
Platelets: 233 10*3/uL (ref 150–400)
RBC: 4.27 MIL/uL (ref 3.87–5.11)
RDW: 13.4 % (ref 11.5–15.5)
WBC: 6.6 10*3/uL (ref 4.0–10.5)
nRBC: 0 % (ref 0.0–0.2)

## 2023-02-06 NOTE — Progress Notes (Signed)
Recent carotid surgery complicated by respiratory atelectasis. Discharged from hospital on home oxygen. Here for Follow up Breast cancer. Taking anastrozole as ordered. Pt has increased fatigue. Appetite is good. Occ breast discomfort. Nipple sensation has returned.

## 2023-02-06 NOTE — Progress Notes (Signed)
[facility] [place] NOTE  Patient Care Team: Kym Groom Guy Begin, MD as PCP - General (Family Medicine) Rico Junker, RN as Oncology Nurse Navigator Cammie Sickle, MD as Consulting Physician (Oncology) Noreene Filbert, MD as Consulting Physician (Radiation Oncology) Erby Pian, MD as Referring Physician (Pulmonary Disease) Benjamine Sprague, DO as Consulting Physician (Surgery) Corey Skains, MD as Consulting Physician (Cardiology)  CHIEF COMPLAINTS/PURPOSE OF CONSULTATION: Breast cancer  #  Oncology History Overview Note  # AUG 20[facility] -[facility]; G-1 ER > 90%; PR 51-90%; her 2=0; [Dr.Sakai]  IMPRESSION: 1. There is a highly suspicious mass in the right breast at 1 o'clock measuring 8 mm.   2. There is an indeterminate right breast mass at 2 o'clock measuring 7 mm.   3.  No evidence of right axillary lymphadenopathy.   Carcinoma of upper-outer quadrant of right breast in female, estrogen receptor positive ([place])  08/03/2021 Initial Diagnosis   Carcinoma of upper-outer quadrant of right breast in female, estrogen receptor positive ([place])      HISTORY OF PRESENTING ILLNESS: Alone.  Ambulating independently.  Barbara Contreras 72 y.o.  female patient breast cancer ER/PR positive HER2 negative of positive stage I breast cancer currently status post lumpectomy followed by radiation.  Patient is currently on anastrozole since January 2023.   In December 2023 patient was referred to the emergency room for acute renal failure/baclofen toxicity.  Also patient had a recent carotid surgery complicated by respiratory atelectasis. Discharged from hospital on home oxygen.   Here for Follow up Breast cancer. Taking anastrozole as ordered. Pt has increased fatigue. Appetite is good. Occ breast discomfort. Nipple sensation has returned.    Review of Systems  Constitutional:  Negative for chills, diaphoresis, fever, malaise/fatigue and weight loss.  HENT:   Negative for nosebleeds and sore throat.   Eyes:  Negative for double vision.  Respiratory:  Negative for cough, hemoptysis, sputum production, shortness of breath and wheezing.   Cardiovascular:  Negative for chest pain, palpitations, orthopnea and leg swelling.  Gastrointestinal:  Negative for abdominal pain, blood in stool, constipation, heartburn, melena and nausea.  Genitourinary:  Negative for dysuria, frequency and urgency.  Musculoskeletal:  Positive for back pain and joint pain.  Skin: Negative.  Negative for itching and rash.  Neurological:  Negative for tingling, focal weakness and headaches.  Endo/Heme/Allergies:  Does not bruise/bleed easily.  Psychiatric/Behavioral:  Negative for depression. The patient is not nervous/anxious and does not have insomnia.      MEDICAL HISTORY:  Past Medical History:  Diagnosis Date   Arthritis    Asthma    Back pain    Breast cancer ([place]) 2022   Carotid artery stenosis    Carotid stenosis    Coronary artery disease    Depression    Emphysema of lung (HCC)    GERD (gastroesophageal reflux disease)    Heart murmur    Hip pain    History of Lyme disease    HLD (hyperlipidemia)    Hyperlipemia    Hypertension    IBS (irritable bowel syndrome)    IBS (irritable bowel syndrome)    Lyme disease    Pre-diabetes    Sciatica    Sciatica    Seizures (HCC)    Tremor    Vitamin D deficiency    Vitamin D deficiency     SURGICAL HISTORY: Past Surgical History:  Procedure Laterality Date   ABDOMINAL HYSTERECTOMY     BREAST BIOPSY Right 07/22/2021  [place] bx 1:00 area, venus marker, IMC tubular features   BREAST BIOPSY Right 07/22/2021   [place] bx 2:00 ribbon marker, fibroadenoma   BREAST LUMPECTOMY Right 08/24/2021   re-excision   BREAST LUMPECTOMY Right 08/12/2021   CAROTID ENDARTERECTOMY Right    CAROTID PTA/STENT INTERVENTION Left 01/25/2023   Procedure: CAROTID PTA/STENT INTERVENTION;  Surgeon: Algernon Huxley, MD;  Location: [facility];  Service: Cardiovascular;  Laterality: Left;   CHOLECYSTECTOMY     COLONOSCOPY WITH PROPOFOL N/A 09/13/2018   Procedure: COLONOSCOPY WITH PROPOFOL;  Surgeon: Lollie Sails, MD;  Location: [facility] ENDOSCOPY;  Service: Endoscopy;  Laterality: N/A;   ESOPHAGOGASTRODUODENOSCOPY N/A 05/03/2021   Procedure: ESOPHAGOGASTRODUODENOSCOPY (EGD);  Surgeon: Lesly Rubenstein, MD;  Location: [facility] ENDOSCOPY;  Service: Endoscopy;  Laterality: N/A;   paranasal sinusotomy     PART MASTECTOMY,RADIO FREQUENCY LOCALIZER,AXILLARY SENTINEL NODE BIOPSY Right 08/12/2021   Procedure: PART MASTECTOMY,RADIO FREQUENCY LOCALIZER,AXILLARY SENTINEL NODE BIOPSY;  Surgeon: Benjamine Sprague, DO;  Location: [facility] ORS;  Service: General;  Laterality: Right;   RE-EXCISION OF BREAST CANCER,SUPERIOR MARGINS Right 08/24/2021   Procedure: RE-EXCISION OF BREAST CANCER,SUPERIOR MARGINS;  Surgeon: Benjamine Sprague, DO;  Location: [facility] ORS;  Service: General;  Laterality: Right;    SOCIAL HISTORY: Social History   Socioeconomic History   Marital status: Divorced    Spouse name: Not on file   Number of children: Not on file   Years of education: Not on file   Highest education level: Not on file  Occupational History   Not on file  Tobacco Use   Smoking status: Every Day    Packs/day: 0.50    Years: 55.00    Total pack years: 27.50    Types: Cigarettes   Smokeless tobacco: Never  Vaping Use   Vaping Use: Never used  Substance and Sexual Activity   Alcohol use: Yes    Alcohol/week: 0.0 standard drinks of alcohol    Comment: occasional   Drug use: Yes    Types: Marijuana   Sexual activity: Not on file  Other Topics Concern   Not on file  Social History Narrative   [place] ~25-30 mins; lives by self; no children/husband; sister near by. Smoker 1ppd/marijuana. Alcohol- beer/liqqor; retd from sales.    Social Determinants of Health   Financial Resource Strain: Not on file  Food Insecurity: No Food  Insecurity (11/07/2022)   Hunger Vital Sign    Worried About Running Out of Food in the Last Year: Never true    Ran Out of Food in the Last Year: Never true  Transportation Needs: No Transportation Needs (11/07/2022)   PRAPARE - Hydrologist (Medical): No    Lack of Transportation (Non-Medical): No  Physical Activity: Not on file  Stress: Not on file  Social Connections: Not on file  Intimate Partner Violence: Not At Risk (11/07/2022)   Humiliation, Afraid, Rape, and Kick questionnaire    Fear of Current or Ex-Partner: No    Emotionally Abused: No    Physically Abused: No    Sexually Abused: No    FAMILY HISTORY: Family History  Problem Relation Age of Onset   Alcohol abuse Father    Alzheimer's disease Father    Anxiety disorder Father    Depression Father    Alcohol abuse Mother    CAD Mother    Diabetes Mother    Hyperlipidemia Mother    Depression Mother     ALLERGIES:  is allergic to  morphine and related, clindamycin, erythromycin, other, penicillins, promethazine-phenylephrine, and tape.  MEDICATIONS:  Current Outpatient Medications  Medication Sig Dispense Refill   albuterol (VENTOLIN HFA) 108 (90 Base) MCG/ACT inhaler Inhale 1-2 puffs into the lungs every 6 (six) hours as needed for wheezing or shortness of breath.     amLODipine (NORVASC) 5 MG tablet Take 5 mg by mouth daily.     anastrozole (ARIMIDEX) 1 MG tablet TAKE 1 TABLET BY MOUTH EVERY DAY 90 tablet 0   aspirin EC 81 MG tablet Take 81 mg by mouth daily.     atorvastatin (LIPITOR) 80 MG tablet Take 80 mg by mouth daily.     baclofen (LIORESAL) 10 MG tablet Take 5-10 mg by mouth at bedtime as needed for muscle spasms.     calcium carbonate (TUMS EX) 750 MG chewable tablet Chew 1 tablet by mouth daily as needed for heartburn.     Cholecalciferol (VITAMIN D3) 10 MCG (400 UNIT) tablet Take 400 Units by mouth daily.     clobetasol (TEMOVATE) 0.05 % external solution Apply 1 Application  topically daily.     clopidogrel (PLAVIX) 75 MG tablet Take 1 tablet (75 mg total) by mouth daily. 30 tablet 6   dicyclomine (BENTYL) 10 MG capsule Take 10 mg by mouth in the morning and at bedtime.     docusate sodium (COLACE) 100 MG capsule Take 100 mg by mouth 2 (two) times daily.     famotidine (PEPCID) 20 MG tablet Take 20 mg by mouth 2 (two) times daily.     gabapentin (NEURONTIN) 600 MG tablet Take 600 mg by mouth 3 (three) times daily.  5   ketoconazole (NIZORAL) 2 % shampoo Apply 1 application topically once a week.     lisinopril-hydrochlorothiazide (ZESTORETIC) 20-12.5 MG tablet Take 1 tablet by mouth in the morning and at bedtime. Hold until you see your doctor     Multiple Vitamin (MULTIVITAMIN WITH MINERALS) TABS tablet Take 1 tablet by mouth daily.     venlafaxine XR (EFFEXOR-XR) 75 MG 24 hr capsule Take 75 mg by mouth 3 (three) times daily.     vitamin B-12 (CYANOCOBALAMIN) 1000 MCG tablet Take 1,000 mcg by mouth daily.     Zinc Sulfate (ZINC 15 PO) Take 15 mg by mouth daily.     No current facility-administered medications for this visit.      Marland Kitchen  PHYSICAL EXAMINATION: ECOG PERFORMANCE STATUS: 0 - Asymptomatic  Vitals:   02/06/23 1438  BP: 117/68  Pulse: 71  Resp: 18  Temp: 97.6 F (36.4 C)  SpO2: 95%   Filed Weights   02/06/23 1438  Weight: 160 lb (72.6 kg)    Physical Exam Vitals and nursing note reviewed.  HENT:     Head: Normocephalic and atraumatic.     Mouth/Throat:     Pharynx: Oropharynx is clear.  Eyes:     Extraocular Movements: Extraocular movements intact.     Pupils: Pupils are equal, round, and reactive to light.  Cardiovascular:     Rate and Rhythm: Normal rate and regular rhythm.     Heart sounds: Murmur heard.  Pulmonary:     Comments: Decreased breath sounds bilaterally.  Abdominal:     Palpations: Abdomen is soft.  Musculoskeletal:        General: Normal range of motion.     Cervical back: Normal range of motion.  Skin:     General: Skin is warm.  Neurological:     General: No focal  deficit present.     Mental Status: She is alert and oriented to person, place, and time.  Psychiatric:        Behavior: Behavior normal.        Judgment: Judgment normal.      LABORATORY DATA:  I have reviewed the data as listed Lab Results  Component Value Date   WBC 6.6 02/06/2023   HGB 12.8 02/06/2023   HCT 40.4 02/06/2023   MCV 94.6 02/06/2023   PLT 233 02/06/2023   Recent Labs    01/27/23 0958 01/28/23 0636 01/29/23 0502 02/06/23 1420  NA 140 137 138 138  K 4.2 4.0 3.7 4.7  CL 108 105 101 101  CO2 29 28 32 31  GLUCOSE 119* 105* 112* 74  BUN '16 12 11 19  '$ CREATININE 0.83 0.74 0.70 1.05*  CALCIUM 8.3* 8.5* 8.7* 9.5  GFRNONAA >60 >60 >60 56*  PROT 5.5*  --  5.6* 7.0  ALBUMIN 3.1*  --  3.0* 3.7  AST 14*  --  14* 16  ALT 13  --  12 15  ALKPHOS 40  --  43 65  BILITOT 0.6  --  0.7 0.5    RADIOGRAPHIC STUDIES: I have personally reviewed the radiological images as listed and agreed with the findings in the report. DG Chest Port 1 View  Result Date: 01/29/2023 CLINICAL DATA:  Hypoxia. EXAM: PORTABLE CHEST 1 VIEW COMPARISON:  January 28, 2023. FINDINGS: Stable cardiomediastinal silhouette. Stable bibasilar opacities are noted concerning for possible edema or infiltrates, right greater than left. Bony thorax is unremarkable. IMPRESSION: Stable bibasilar opacities as described above. Electronically Signed   By: Marijo Conception M.D.   On: 01/29/2023 08:54   DG Chest Port 1 View  Result Date: 01/28/2023 CLINICAL DATA:  Shortness of breath at rest. EXAM: PORTABLE CHEST 1 VIEW COMPARISON:  Chest radiograph 11/06/2022. chest CT 10/01/2022 FINDINGS: Mild cardiomegaly. Aortic atherosclerosis, unchanged mediastinal contours. Ill-defined opacity at both lung bases with blunting of costophrenic angles likely represent small effusion and associated atelectasis and/or airspace disease. Slight vascular congestion without  overt edema. No pneumothorax. IMPRESSION: 1. Mild cardiomegaly. Mild vascular congestion. 2. Bibasilar opacities likely represent small effusions and associated atelectasis and/or airspace disease. Electronically Signed   By: Keith Rake M.D.   On: 01/28/2023 12:09   PERIPHERAL VASCULAR CATHETERIZATION  Result Date: 01/25/2023 See surgical note for result.   ASSESSMENT & PLAN:   Carcinoma of upper-outer quadrant of right breast in female, estrogen receptor positive ([place]) #Stage I pT1a grade 1- ER/PR positive; her2 NEU-NEGATIVE.  S/p postlumpectomy radiation; 11/28 last RT. Currently on Anastrazole [since jan 2023]; Sep 2023- Bil-Mammo-WNL.  Will order mammogram September 2024.  # Continue anastrozole. Tolerating well.   # OSTEOPENIA- [FEB 2022]T-score of -2.2.  Continue calcium plus vitamin D.will order today-BMD September 2024.  # Acute renal failure- [dec [id number]- resolved. Monitor for now.   # PVD s/p Left CEA-on aspirin plus Plavix [as per vascular]-stable.  # DISPOSITION: # follow up in 7 months-  MD; labs- cbc/cmp; PRIOR- Bil Mammo;Dexa scan-- Dr.B  All questions were answered. The patient/family knows to call the clinic with any problems, questions or concerns.    Cammie Sickle, MD 02/06/2023 3:28 PM

## 2023-02-06 NOTE — Assessment & Plan Note (Addendum)
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  S/p postlumpectomy radiation; 11/28 last RT. Currently on Anastrazole [since jan 2023]; Sep 2023- Bil-Mammo-WNL.  Will order mammogram September 2024.  # Continue anastrozole. Tolerating well.   # OSTEOPENIA- [FEB 2022]T-score of -2.2.  Continue calcium plus vitamin D.will order today-BMD September 2024.  # Acute renal failure- [dec [id number]- resolved. Monitor for now.   # PVD s/p Left CEA-on aspirin plus Plavix [as per vascular]-stable.  # DISPOSITION: # follow up in 7 months-  MD; labs- cbc/cmp; PRIOR- Bil Mammo;Dexa scan-- Dr.B

## 2023-02-22 ENCOUNTER — Other Ambulatory Visit (INDEPENDENT_AMBULATORY_CARE_PROVIDER_SITE_OTHER): Payer: Self-pay | Admitting: Vascular Surgery

## 2023-02-22 DIAGNOSIS — I6523 Occlusion and stenosis of bilateral carotid arteries: Secondary | ICD-10-CM

## 2023-02-22 DIAGNOSIS — Z95820 Peripheral vascular angioplasty status with implants and grafts: Secondary | ICD-10-CM

## 2023-02-22 DIAGNOSIS — Z959 Presence of cardiac and vascular implant and graft, unspecified: Secondary | ICD-10-CM

## 2023-02-22 DIAGNOSIS — I771 Stricture of artery: Secondary | ICD-10-CM

## 2023-02-28 ENCOUNTER — Encounter (INDEPENDENT_AMBULATORY_CARE_PROVIDER_SITE_OTHER): Payer: Self-pay | Admitting: Nurse Practitioner

## 2023-02-28 ENCOUNTER — Ambulatory Visit (INDEPENDENT_AMBULATORY_CARE_PROVIDER_SITE_OTHER): Payer: 59

## 2023-02-28 ENCOUNTER — Other Ambulatory Visit: Payer: Self-pay | Admitting: Internal Medicine

## 2023-02-28 ENCOUNTER — Ambulatory Visit (INDEPENDENT_AMBULATORY_CARE_PROVIDER_SITE_OTHER): Payer: 59 | Admitting: Nurse Practitioner

## 2023-02-28 VITALS — BP 131/74 | HR 70 | Resp 16

## 2023-02-28 DIAGNOSIS — E785 Hyperlipidemia, unspecified: Secondary | ICD-10-CM

## 2023-02-28 DIAGNOSIS — Z959 Presence of cardiac and vascular implant and graft, unspecified: Secondary | ICD-10-CM

## 2023-02-28 DIAGNOSIS — Z95820 Peripheral vascular angioplasty status with implants and grafts: Secondary | ICD-10-CM | POA: Diagnosis not present

## 2023-02-28 DIAGNOSIS — I739 Peripheral vascular disease, unspecified: Secondary | ICD-10-CM

## 2023-02-28 DIAGNOSIS — I771 Stricture of artery: Secondary | ICD-10-CM

## 2023-02-28 DIAGNOSIS — I6523 Occlusion and stenosis of bilateral carotid arteries: Secondary | ICD-10-CM | POA: Diagnosis not present

## 2023-02-28 DIAGNOSIS — I10 Essential (primary) hypertension: Secondary | ICD-10-CM

## 2023-02-28 LAB — VAS US ABI WITH/WO TBI
Left ABI: 0.91
Right ABI: 1

## 2023-03-20 ENCOUNTER — Encounter (INDEPENDENT_AMBULATORY_CARE_PROVIDER_SITE_OTHER): Payer: Self-pay | Admitting: Nurse Practitioner

## 2023-03-20 NOTE — Progress Notes (Signed)
Subjective:    Patient ID: Barbara Contreras, female    DOB: 1951-05-30, 72 y.o.   MRN: [id number] Chief Complaint  Patient presents with   Follow-up    Ultrasound follow up    The patient is seen for follow up evaluation of carotid stenosis status post right carotid stent on 01/25/2023.  She also had right common iliac artery stenting done at that time.  There were no post operative problems or complications related to the surgery.  The patient denies neck or incisional pain.  The patient denies interval amaurosis fugax. There is no recent history of TIA symptoms or focal motor deficits. There is no prior documented CVA.  The patient denies headache.  The patient is taking enteric-coated aspirin 81 mg daily.  No recent shortening of the patient's walking distance or new symptoms consistent with claudication.  No history of rest pain symptoms. No new ulcers or wounds of the lower extremities have occurred.  There is no history of DVT, PE or superficial thrombophlebitis. No recent episodes of angina or shortness of breath documented.   There is a 40 to 59% stenosis bilaterally.  The right ICA has a patent carotid stent.  The patient has right ABI of 1.0 and a left of 0.91.  The patient has triphasic tibial artery waveforms bilaterally.  The patient has a patent right common iliac artery stent today.     Review of Systems  All other systems reviewed and are negative.      Objective:   Physical Exam Vitals reviewed.  HENT:     Head: Normocephalic.  Neck:     Vascular: No carotid bruit.  Cardiovascular:     Rate and Rhythm: Normal rate.  Pulmonary:     Effort: Pulmonary effort is normal.  Skin:    General: Skin is warm and dry.  Neurological:     Mental Status: She is alert and oriented to person, place, and time.  Psychiatric:        Mood and Affect: Mood normal.        Behavior: Behavior normal.        Thought Content: Thought content normal.        Judgment:  Judgment normal.     BP 131/74 (BP Location: Left Arm)   Pulse 70   Resp 16   Past Medical History:  Diagnosis Date   Arthritis    Asthma    Back pain    Breast cancer 2022   Carotid artery stenosis    Carotid stenosis    Coronary artery disease    Depression    Emphysema of lung    GERD (gastroesophageal reflux disease)    Heart murmur    Hip pain    History of Lyme disease    HLD (hyperlipidemia)    Hyperlipemia    Hypertension    IBS (irritable bowel syndrome)    IBS (irritable bowel syndrome)    Lyme disease    Pre-diabetes    Sciatica    Sciatica    Seizures    Tremor    Vitamin D deficiency    Vitamin D deficiency     Social History   Socioeconomic History   Marital status: Divorced    Spouse name: Not on file   Number of children: Not on file   Years of education: Not on file   Highest education level: Not on file  Occupational History   Not on file  Tobacco Use  Smoking status: Every Day    Packs/day: 0.50    Years: 55.00    Additional pack years: 0.00    Total pack years: 27.50    Types: Cigarettes   Smokeless tobacco: Never  Vaping Use   Vaping Use: Never used  Substance and Sexual Activity   Alcohol use: Yes    Alcohol/week: 0.0 standard drinks of alcohol    Comment: occasional   Drug use: Yes    Types: Marijuana   Sexual activity: Not on file  Other Topics Concern   Not on file  Social History Narrative   [place]25-30 mins; lives by self; no children/husband; sister near by. Smoker 1ppd/marijuana. Alcohol- beer/liqqor; retd from sales.    Social Determinants of Health   Financial Resource Strain: Not on file  Food Insecurity: No Food Insecurity (11/07/2022)   Hunger Vital Sign    Worried About Running Out of Food in the Last Year: Never true    Ran Out of Food in the Last Year: Never true  Transportation Needs: No Transportation Needs (11/07/2022)   PRAPARE - Administrator, Civil Service (Medical): No     Lack of Transportation (Non-Medical): No  Physical Activity: Not on file  Stress: Not on file  Social Connections: Not on file  Intimate Partner Violence: Not At Risk (11/07/2022)   Humiliation, Afraid, Rape, and Kick questionnaire    Fear of Current or Ex-Partner: No    Emotionally Abused: No    Physically Abused: No    Sexually Abused: No    Past Surgical History:  Procedure Laterality Date   ABDOMINAL HYSTERECTOMY     BREAST BIOPSY Right 07/22/2021   [place] bx 1:00 area, venus marker, IMC tubular features   BREAST BIOPSY Right 07/22/2021   [place] bx 2:00 ribbon marker, fibroadenoma   BREAST LUMPECTOMY Right 08/24/2021   re-excision   BREAST LUMPECTOMY Right 08/12/2021   CAROTID ENDARTERECTOMY Right    CAROTID PTA/STENT INTERVENTION Left 01/25/2023   Procedure: CAROTID PTA/STENT INTERVENTION;  Surgeon: Annice Needy, MD;  Location: [facility] INVASIVE CV LAB;  Service: Cardiovascular;  Laterality: Left;   CHOLECYSTECTOMY     COLONOSCOPY WITH PROPOFOL N/A 09/13/2018   Procedure: COLONOSCOPY WITH PROPOFOL;  Surgeon: Christena Deem, MD;  Location: [facility] ENDOSCOPY;  Service: Endoscopy;  Laterality: N/A;   ESOPHAGOGASTRODUODENOSCOPY N/A 05/03/2021   Procedure: ESOPHAGOGASTRODUODENOSCOPY (EGD);  Surgeon: Regis Bill, MD;  Location: [facility] ENDOSCOPY;  Service: Endoscopy;  Laterality: N/A;   paranasal sinusotomy     PART MASTECTOMY,RADIO FREQUENCY LOCALIZER,AXILLARY SENTINEL NODE BIOPSY Right 08/12/2021   Procedure: PART MASTECTOMY,RADIO FREQUENCY LOCALIZER,AXILLARY SENTINEL NODE BIOPSY;  Surgeon: Sung Amabile, DO;  Location: [facility] ORS;  Service: General;  Laterality: Right;   RE-EXCISION OF BREAST CANCER,SUPERIOR MARGINS Right 08/24/2021   Procedure: RE-EXCISION OF BREAST CANCER,SUPERIOR MARGINS;  Surgeon: Sung Amabile, DO;  Location: [facility] ORS;  Service: General;  Laterality: Right;    Family History  Problem Relation Age of Onset   Alcohol abuse Father    Alzheimer's disease Father     Anxiety disorder Father    Depression Father    Alcohol abuse Mother    CAD Mother    Diabetes Mother    Hyperlipidemia Mother    Depression Mother     Allergies  Allergen Reactions   Morphine And Related Itching   Clindamycin Other (See Comments)   Erythromycin     Other reaction(s): Other (see comments)   Other     Other reaction(s):  Other (See Comments) Redness and itching   Penicillins Other (See Comments)    TOLERATED CEFAZOLIN PRIOR Reaction: unknown Has patient had a PCN reaction causing immediate rash, facial/tongue/throat swelling, SOB or lightheadedness with hypotension: Unknown Has patient had a PCN reaction causing severe rash involving mucus membranes or skin necrosis: Unknown Has patient had a PCN reaction that required hospitalization: Unknown Has patient had a PCN reaction occurring within the last 10 years: no If all of the above answers are "NO", then may proceed with Cephalosporin use.   Promethazine-Phenylephrine Other (See Comments)    Muscle spasm   Tape Other (See Comments)    Redness and itching       Latest Ref Rng & Units 02/06/2023    2:20 PM 01/29/2023    5:02 AM 01/28/2023    6:36 AM  CBC  WBC 4.0 - 10.5 K/uL 6.6  6.2  6.4   Hemoglobin 12.0 - 15.0 g/dL 62.1  30.8  65.7   Hematocrit 36.0 - 46.0 % 40.4  34.0  33.7   Platelets 150 - 400 K/uL 233  116  117       CMP     Component Value Date/Time   NA 138 02/06/2023 1420   K 4.7 02/06/2023 1420   CL 101 02/06/2023 1420   CO2 31 02/06/2023 1420   GLUCOSE 74 02/06/2023 1420   BUN 19 02/06/2023 1420   CREATININE 1.05 (H) 02/06/2023 1420   CALCIUM 9.5 02/06/2023 1420   PROT 7.0 02/06/2023 1420   ALBUMIN 3.7 02/06/2023 1420   AST 16 02/06/2023 1420   ALT 15 02/06/2023 1420   ALKPHOS 65 02/06/2023 1420   BILITOT 0.5 02/06/2023 1420   GFRNONAA 56 (L) 02/06/2023 1420   GFRAA >60 06/17/2017 0302     VAS [place] ABI WITH/WO TBI  Result Date: 02/28/2023  LOWER EXTREMITY DOPPLER STUDY Patient  Name:  DALIYAH SRAMEK  Date of Exam:   02/28/2023 Medical Rec #: [id number]          Accession #:    [id number] Date of Birth: Apr 21, 1951          Patient Gender: F Patient Age:   43 years Exam Location:  [facility] Procedure:      VAS [place] ABI WITH/WO TBI Referring Phys: Barbara Cower DEW --------------------------------------------------------------------------------  Indications: Peripheral artery disease. High Risk Factors: Hypertension, hyperlipidemia, current smoker, coronary artery                    disease.  Vascular Interventions: 01/25/2023 Left carotid and right common iliac artery                         stenting. Performing Technologist: Hardie Lora RVT  Examination Guidelines: A complete evaluation includes at minimum, Doppler waveform signals and systolic blood pressure reading at the level of bilateral brachial, anterior tibial, and posterior tibial arteries, when vessel segments are accessible. Bilateral testing is considered an integral part of a complete examination. Photoelectric Plethysmograph (PPG) waveforms and toe systolic pressure readings are included as required and additional duplex testing as needed. Limited examinations for reoccurring indications may be performed as noted.  ABI Findings: +---------+------------------+-----+---------+--------+ Right    Rt Pressure (mmHg)IndexWaveform Comment  +---------+------------------+-----+---------+--------+ Brachial 137                                      +---------+------------------+-----+---------+--------+  PTA      142               1.01 triphasic         +---------+------------------+-----+---------+--------+ DP       131               0.94 triphasic         +---------+------------------+-----+---------+--------+ Great Toe125               0.89                   +---------+------------------+-----+---------+--------+ +---------+------------------+-----+---------+-------+ Left     Lt Pressure  (mmHg)IndexWaveform Comment +---------+------------------+-----+---------+-------+ Brachial 140                                     +---------+------------------+-----+---------+-------+ PTA      127               0.91 triphasic        +---------+------------------+-----+---------+-------+ DP       122               0.87 triphasic        +---------+------------------+-----+---------+-------+ Great Toe100               0.71                  +---------+------------------+-----+---------+-------+ +-------+-----------+-----------+------------+------------+ ABI/TBIToday's ABIToday's TBIPrevious ABIPrevious TBI +-------+-----------+-----------+------------+------------+ Right  1.0        0.89                                +-------+-----------+-----------+------------+------------+ Left   0.91       0.71                                +-------+-----------+-----------+------------+------------+  Summary: Right: Resting right ankle-brachial index is within normal range. The right toe-brachial index is normal. Left: Resting left ankle-brachial index indicates mild left lower extremity arterial disease. The left toe-brachial index is normal. *See table(s) above for measurements and observations.  Electronically signed by Festus Barren MD on 02/28/2023 at 4:34:35 PM.    Final        Assessment & Plan:   1. Bilateral carotid artery stenosis Recommend:  The patient is s/p successful right carotid stent  Duplex ultrasound  shows 40-59%  stenosis bilaterally.  Continue antiplatelet therapy as prescribed Continue management of CAD, HTN and Hyperlipidemia Healthy heart diet,  encouraged exercise at least 4 times per week  The patient's NIHSS score is as follows: 0 Mild: 1 - 5 Mild to Moderately Severe: 5 - 14 Severe: 15 - 24 Very Severe: >25  Follow up in 6 months with duplex ultrasound and physical exam based on the patient's carotid intervention.  2. Essential  hypertension Continue antihypertensive medications as already ordered, these medications have been reviewed and there are no changes at this time.  3. Dyslipidemia Continue statin as ordered and reviewed, no changes at this time  4. PAD (peripheral artery disease) (HCC)  Recommend:  The patient has evidence of atherosclerosis of the lower extremities with claudication.  The patient does not voice lifestyle limiting changes at this point in time.  Noninvasive studies do not suggest clinically significant change.  No invasive studies, angiography or surgery at this time  The patient should continue walking and begin a more formal exercise program.  The patient should continue antiplatelet therapy and aggressive treatment of the lipid abnormalities  No changes in the patient's medications at this time  Continued surveillance is indicated as atherosclerosis is likely to progress with time.    The patient will continue follow up with noninvasive studies as ordered.    Current Outpatient Medications on File Prior to Visit  Medication Sig Dispense Refill   albuterol (VENTOLIN HFA) 108 (90 Base) MCG/ACT inhaler Inhale 1-2 puffs into the lungs every 6 (six) hours as needed for wheezing or shortness of breath.     amLODipine (NORVASC) 5 MG tablet Take 5 mg by mouth daily.     aspirin EC 81 MG tablet Take 81 mg by mouth daily.     atorvastatin (LIPITOR) 80 MG tablet Take 80 mg by mouth daily.     baclofen (LIORESAL) 10 MG tablet Take 5-10 mg by mouth at bedtime as needed for muscle spasms.     calcium carbonate (TUMS EX) 750 MG chewable tablet Chew 1 tablet by mouth daily as needed for heartburn.     Cholecalciferol (VITAMIN D3) 10 MCG (400 UNIT) tablet Take 400 Units by mouth daily.     clobetasol (TEMOVATE) 0.05 % external solution Apply 1 Application topically daily.     clopidogrel (PLAVIX) 75 MG tablet Take 1 tablet (75 mg total) by mouth daily. 30 tablet 6   dicyclomine (BENTYL) 10 MG  capsule Take 10 mg by mouth in the morning and at bedtime.     docusate sodium (COLACE) 100 MG capsule Take 100 mg by mouth 2 (two) times daily.     famotidine (PEPCID) 20 MG tablet Take 20 mg by mouth 2 (two) times daily.     gabapentin (NEURONTIN) 600 MG tablet Take 600 mg by mouth 3 (three) times daily.  5   ketoconazole (NIZORAL) 2 % shampoo Apply 1 application topically once a week.     lisinopril-hydrochlorothiazide (ZESTORETIC) 20-12.5 MG tablet Take 1 tablet by mouth in the morning and at bedtime. Hold until you see your doctor     Multiple Vitamin (MULTIVITAMIN WITH MINERALS) TABS tablet Take 1 tablet by mouth daily.     venlafaxine XR (EFFEXOR-XR) 75 MG 24 hr capsule Take 75 mg by mouth 3 (three) times daily.     vitamin B-12 (CYANOCOBALAMIN) 1000 MCG tablet Take 1,000 mcg by mouth daily.     Zinc Sulfate (ZINC 15 PO) Take 15 mg by mouth daily.     No current facility-administered medications on file prior to visit.    There are no Patient Instructions on file for this visit. No follow-ups on file.   Georgiana Spinner, NP

## 2023-05-02 ENCOUNTER — Other Ambulatory Visit: Payer: Self-pay | Admitting: Internal Medicine

## 2023-05-24 ENCOUNTER — Other Ambulatory Visit (INDEPENDENT_AMBULATORY_CARE_PROVIDER_SITE_OTHER): Payer: Self-pay | Admitting: Nurse Practitioner

## 2023-05-24 DIAGNOSIS — I6523 Occlusion and stenosis of bilateral carotid arteries: Secondary | ICD-10-CM

## 2023-05-24 DIAGNOSIS — I739 Peripheral vascular disease, unspecified: Secondary | ICD-10-CM

## 2023-05-29 ENCOUNTER — Encounter (INDEPENDENT_AMBULATORY_CARE_PROVIDER_SITE_OTHER): Payer: Self-pay | Admitting: Vascular Surgery

## 2023-05-29 ENCOUNTER — Ambulatory Visit (INDEPENDENT_AMBULATORY_CARE_PROVIDER_SITE_OTHER): Payer: 59

## 2023-05-29 ENCOUNTER — Ambulatory Visit (INDEPENDENT_AMBULATORY_CARE_PROVIDER_SITE_OTHER): Payer: 59 | Admitting: Vascular Surgery

## 2023-05-29 VITALS — BP 90/58 | HR 76 | Resp 18 | Ht 65.0 in | Wt 158.0 lb

## 2023-05-29 DIAGNOSIS — I6523 Occlusion and stenosis of bilateral carotid arteries: Secondary | ICD-10-CM | POA: Diagnosis not present

## 2023-05-29 DIAGNOSIS — I739 Peripheral vascular disease, unspecified: Secondary | ICD-10-CM

## 2023-05-29 DIAGNOSIS — E785 Hyperlipidemia, unspecified: Secondary | ICD-10-CM

## 2023-05-29 DIAGNOSIS — F17200 Nicotine dependence, unspecified, uncomplicated: Secondary | ICD-10-CM | POA: Diagnosis not present

## 2023-05-29 DIAGNOSIS — Z9889 Other specified postprocedural states: Secondary | ICD-10-CM | POA: Diagnosis not present

## 2023-05-29 DIAGNOSIS — I10 Essential (primary) hypertension: Secondary | ICD-10-CM | POA: Diagnosis not present

## 2023-05-29 LAB — VAS US ABI WITH/WO TBI
Left ABI: 1.14
Right ABI: 1.15

## 2023-05-29 NOTE — Progress Notes (Signed)
MRN : [id number]  Barbara Contreras is a 72 y.o. (11/21/1951) female who presents with chief complaint of  Chief Complaint  Patient presents with   Follow-up    1 month follow up  .  History of Present Illness: Patient returns in follow-up of multiple vascular issues.  Earlier this year, she underwent left carotid artery stent placement.  At that time, she had severe concomitant iliac artery occlusive disease that required treatment as well.  She says she is doing well.  She currently does not have any lifestyle limiting claudication, ischemic rest pain, or ulceration.  Her ABIs today are 1.15 on the right and 1.14 on the left with no changes from her previous study after the procedure 3 months ago. Duplex today shows stable velocities in the 40 to 59% range on the right and a patent left carotid stent without significant recurrent stenosis. No focal neurologic symptoms.  Specifically, the patient denies amaurosis fugax, speech or swallowing difficulties, or arm or leg weakness or numbness   Current Outpatient Medications  Medication Sig Dispense Refill   albuterol (VENTOLIN HFA) 108 (90 Base) MCG/ACT inhaler Inhale 1-2 puffs into the lungs every 6 (six) hours as needed for wheezing or shortness of breath.     amLODipine (NORVASC) 5 MG tablet Take 5 mg by mouth daily.     anastrozole (ARIMIDEX) 1 MG tablet TAKE 1 TABLET BY MOUTH EVERY DAY 90 tablet 0   aspirin EC 81 MG tablet Take 81 mg by mouth daily.     atorvastatin (LIPITOR) 80 MG tablet Take 80 mg by mouth daily.     baclofen (LIORESAL) 10 MG tablet Take 5-10 mg by mouth at bedtime as needed for muscle spasms.     calcium carbonate (TUMS EX) 750 MG chewable tablet Chew 1 tablet by mouth daily as needed for heartburn.     Cholecalciferol (VITAMIN D3) 10 MCG (400 UNIT) tablet Take 400 Units by mouth daily.     clobetasol (TEMOVATE) 0.05 % external solution Apply 1 Application topically daily.     clopidogrel (PLAVIX) 75 MG tablet Take 1  tablet (75 mg total) by mouth daily. 30 tablet 6   dicyclomine (BENTYL) 10 MG capsule Take 10 mg by mouth in the morning and at bedtime.     docusate sodium (COLACE) 100 MG capsule Take 100 mg by mouth 2 (two) times daily.     famotidine (PEPCID) 20 MG tablet Take 20 mg by mouth 2 (two) times daily.     fluticasone (FLONASE) 50 MCG/ACT nasal spray Place 2 sprays into both nostrils as needed.     gabapentin (NEURONTIN) 600 MG tablet Take 600 mg by mouth 3 (three) times daily.  5   ketoconazole (NIZORAL) 2 % shampoo Apply 1 application topically once a week.     lisinopril-hydrochlorothiazide (ZESTORETIC) 20-12.5 MG tablet Take 1 tablet by mouth in the morning and at bedtime. Hold until you see your doctor     Multiple Vitamin (MULTIVITAMIN WITH MINERALS) TABS tablet Take 1 tablet by mouth daily.     venlafaxine XR (EFFEXOR-XR) 75 MG 24 hr capsule Take 75 mg by mouth 3 (three) times daily.     vitamin B-12 (CYANOCOBALAMIN) 1000 MCG tablet Take 1,000 mcg by mouth daily.     Zinc Sulfate (ZINC 15 PO) Take 15 mg by mouth daily.     No current facility-administered medications for this visit.    Past Medical History:  Diagnosis Date   Arthritis  Asthma    Back pain    Breast cancer (HCC) 2022   Carotid artery stenosis    Carotid stenosis    Coronary artery disease    Depression    Emphysema of lung (HCC)    GERD (gastroesophageal reflux disease)    Heart murmur    Hip pain    History of Lyme disease    HLD (hyperlipidemia)    Hyperlipemia    Hypertension    IBS (irritable bowel syndrome)    IBS (irritable bowel syndrome)    Lyme disease    Pre-diabetes    Sciatica    Sciatica    Seizures (HCC)    Tremor    Vitamin D deficiency    Vitamin D deficiency     Past Surgical History:  Procedure Laterality Date   ABDOMINAL HYSTERECTOMY     BREAST BIOPSY Right 07/22/2021   [place] bx 1:00 area, venus marker, IMC tubular features   BREAST BIOPSY Right 07/22/2021   [place] bx 2:00 ribbon  marker, fibroadenoma   BREAST LUMPECTOMY Right 08/24/2021   re-excision   BREAST LUMPECTOMY Right 08/12/2021   CAROTID ENDARTERECTOMY Right    CAROTID PTA/STENT INTERVENTION Left 01/25/2023   Procedure: CAROTID PTA/STENT INTERVENTION;  Surgeon: Annice Needy, MD;  Location: [facility] INVASIVE CV LAB;  Service: Cardiovascular;  Laterality: Left;   CHOLECYSTECTOMY     COLONOSCOPY WITH PROPOFOL N/A 09/13/2018   Procedure: COLONOSCOPY WITH PROPOFOL;  Surgeon: Christena Deem, MD;  Location: [facility] ENDOSCOPY;  Service: Endoscopy;  Laterality: N/A;   ESOPHAGOGASTRODUODENOSCOPY N/A 05/03/2021   Procedure: ESOPHAGOGASTRODUODENOSCOPY (EGD);  Surgeon: Regis Bill, MD;  Location: [facility] ENDOSCOPY;  Service: Endoscopy;  Laterality: N/A;   paranasal sinusotomy     PART MASTECTOMY,RADIO FREQUENCY LOCALIZER,AXILLARY SENTINEL NODE BIOPSY Right 08/12/2021   Procedure: PART MASTECTOMY,RADIO FREQUENCY LOCALIZER,AXILLARY SENTINEL NODE BIOPSY;  Surgeon: Sung Amabile, DO;  Location: [facility] ORS;  Service: General;  Laterality: Right;   RE-EXCISION OF BREAST CANCER,SUPERIOR MARGINS Right 08/24/2021   Procedure: RE-EXCISION OF BREAST CANCER,SUPERIOR MARGINS;  Surgeon: Sung Amabile, DO;  Location: [facility] ORS;  Service: General;  Laterality: Right;     Social History   Tobacco Use   Smoking status: Every Day    Packs/day: 0.50    Years: 55.00    Additional pack years: 0.00    Total pack years: 27.50    Types: Cigarettes   Smokeless tobacco: Never  Vaping Use   Vaping Use: Never used  Substance Use Topics   Alcohol use: Yes    Alcohol/week: 0.0 standard drinks of alcohol    Comment: occasional   Drug use: Yes    Types: Marijuana      Family History  Problem Relation Age of Onset   Alcohol abuse Father    Alzheimer's disease Father    Anxiety disorder Father    Depression Father    Alcohol abuse Mother    CAD Mother    Diabetes Mother    Hyperlipidemia Mother    Depression Mother       Allergies  Allergen Reactions   Morphine And Codeine Itching   Clindamycin Other (See Comments)   Erythromycin     Other reaction(s): Other (see comments)   Other     Other reaction(s): Other (See Comments) Redness and itching   Penicillins Other (See Comments)    TOLERATED CEFAZOLIN PRIOR Reaction: unknown Has patient had a PCN reaction causing immediate rash, facial/tongue/throat swelling, SOB or lightheadedness with hypotension: Unknown Has patient had  a PCN reaction causing severe rash involving mucus membranes or skin necrosis: Unknown Has patient had a PCN reaction that required hospitalization: Unknown Has patient had a PCN reaction occurring within the last 10 years: no If all of the above answers are "NO", then may proceed with Cephalosporin use.   Promethazine-Phenylephrine Other (See Comments)    Muscle spasm   Tape Other (See Comments)    Redness and itching     REVIEW OF SYSTEMS (Negative unless checked)   Constitutional: [] Weight loss  [] Fever  [] Chills Cardiac: [] Chest pain   [] Chest pressure   [] Palpitations   [] Shortness of breath when laying flat   [] Shortness of breath at rest   [x] Shortness of breath with exertion. Vascular:  [] Pain in legs with walking   [] Pain in legs at rest   [] Pain in legs when laying flat   [] Claudication   [] Pain in feet when walking  [] Pain in feet at rest  [] Pain in feet when laying flat   [] History of DVT   [] Phlebitis   [] Swelling in legs   [] Varicose veins   [] Non-healing ulcers Pulmonary:   [] Uses home oxygen   [] Productive cough   [] Hemoptysis   [] Wheeze  [x] COPD   [] Asthma Neurologic:  [] Dizziness  [] Blackouts   [x] Seizures   [] History of stroke   [] History of TIA  [] Aphasia   [] Temporary blindness   [] Dysphagia   [] Weakness or numbness in arms   [] Weakness or numbness in legs Musculoskeletal:  [x] Arthritis   [] Joint swelling   [] Joint pain   [x] Low back pain Hematologic:  [] Easy bruising  [] Easy bleeding   [] Hypercoagulable  state   [] Anemic  [] Hepatitis Gastrointestinal:  [] Blood in stool   [] Vomiting blood  [x] Gastroesophageal reflux/heartburn   [] Abdominal pain Genitourinary:  [] Chronic kidney disease   [] Difficult urination  [] Frequent urination  [] Burning with urination   [] Hematuria Skin:  [] Rashes   [] Ulcers   [] Wounds Psychological:  [] History of anxiety   []  History of major depression.  Physical Examination  Vitals:   05/29/23 1439  BP: (!) 90/58  Pulse: 76  Resp: 18  Weight: 158 lb (71.7 kg)  Height: 5\' 5"  (1.651 m)   Body mass index is 26.29 kg/m. Gen:  WD/WN, NAD Head: [place]/AT, No temporalis wasting. Ear/Nose/Throat: Hearing grossly intact, nares w/o erythema or drainage, trachea midline Eyes: Conjunctiva clear. Sclera non-icteric Neck: Supple.  No bruit  Pulmonary:  Good air movement, equal and clear to auscultation bilaterally.  Cardiac: RRR, No JVD Vascular:  Vessel Right Left  Radial Palpable Palpable           Musculoskeletal: M/S 5/5 throughout.  No deformity or atrophy. No edema. Neurologic: CN 2-12 intact. Sensation grossly intact in extremities.  Symmetrical.  Speech is fluent. Motor exam as listed above. Psychiatric: Judgment intact, Mood & affect appropriate for pt's clinical situation. Dermatologic: No rashes or ulcers noted.  No cellulitis or open wounds.    CBC Lab Results  Component Value Date   WBC 6.6 02/06/2023   HGB 12.8 02/06/2023   HCT 40.4 02/06/2023   MCV 94.6 02/06/2023   PLT 233 02/06/2023    BMET    Component Value Date/Time   NA 138 02/06/2023 1420   K 4.7 02/06/2023 1420   CL 101 02/06/2023 1420   CO2 31 02/06/2023 1420   GLUCOSE 74 02/06/2023 1420   BUN 19 02/06/2023 1420   CREATININE 1.05 (H) 02/06/2023 1420   CALCIUM 9.5 02/06/2023 1420   GFRNONAA 56 (L) 02/06/2023  1420   GFRAA >60 06/17/2017 0302   CrCl cannot be calculated (Patient's most recent lab result is older than the maximum 21 days allowed.).  COAG Lab Results  Component  Value Date   INR 1.0 08/03/2021    Radiology VAS US CAROTID  Result Date: 05/29/2023 Carotid Arterial Duplex Study Patient Name:  Barbara Contreras  Date of Exam:   05/29/2023 Medical Rec #: [id number]          Accession #:    [id number] Date of Birth: [birth year]          Patient Gender: F Patient Age:   22 years Exam Location:  [facility] Procedure:      VAS US CAROTID Referring Phys: Sheppard Plumber --------------------------------------------------------------------------------  Risk Factors:      Hypertension, hyperlipidemia. Other Factors:     Rt CEA many years ago, Lt ICA stent 01/2023. Comparison Study:  02/2023 Performing Technologist: Salvadore Farber RVT  Examination Guidelines: A complete evaluation includes B-mode imaging, spectral Doppler, color Doppler, and power Doppler as needed of all accessible portions of each vessel. Bilateral testing is considered an integral part of a complete examination. Limited examinations for reoccurring indications may be performed as noted.  Right Carotid Findings: +----------+-------+--------+--------+--------------------------------+--------+           PSV    EDV cm/sStenosisPlaque Description              Comments           cm/s                                                            +----------+-------+--------+--------+--------------------------------+--------+ CCA Prox  67     18                                                       +----------+-------+--------+--------+--------------------------------+--------+ CCA Mid   85     21                                                       +----------+-------+--------+--------+--------------------------------+--------+ CCA Distal135    25                                                       +----------+-------+--------+--------+--------------------------------+--------+ ICA Prox  221    39      40-59%  irregular, heterogenous and                                                diffuse                                  +----------+-------+--------+--------+--------------------------------+--------+  ICA Mid   116    34                                                       +----------+-------+--------+--------+--------------------------------+--------+ ICA Distal100    23                                                       +----------+-------+--------+--------+--------------------------------+--------+ ECA       139    7                                                        +----------+-------+--------+--------+--------------------------------+--------+ +----------+--------+-------+----------------+-------------------+           PSV cm/sEDV cmsDescribe        Arm Pressure (mmHG) +----------+--------+-------+----------------+-------------------+ [id number]            Multiphasic, WNL                    +----------+--------+-------+----------------+-------------------+ +---------+--------+--+--------+--+---------+ VertebralPSV cm/s39EDV cm/s12Antegrade +---------+--------+--+--------+--+---------+  Left Carotid Findings: +----------+--------+--------+--------+------------------+--------+           PSV cm/sEDV cm/sStenosisPlaque DescriptionComments +----------+--------+--------+--------+------------------+--------+ CCA Prox  67      16                                         +----------+--------+--------+--------+------------------+--------+ CCA Mid   55      13                                         +----------+--------+--------+--------+------------------+--------+ CCA Distal43      11                                         +----------+--------+--------+--------+------------------+--------+ ICA Prox  181     46      40-59%                    stent    +----------+--------+--------+--------+------------------+--------+ ICA Mid   169     38                                          +----------+--------+--------+--------+------------------+--------+ ICA Distal148     31                                         +----------+--------+--------+--------+------------------+--------+ ECA       106     24                                         +----------+--------+--------+--------+------------------+--------+ +----------+--------+--------+----------------+-------------------+  PSV cm/sEDV cm/sDescribe        Arm Pressure (mmHG) +----------+--------+--------+----------------+-------------------+ Subclavian211             Multiphasic, WNL                    +----------+--------+--------+----------------+-------------------+ +---------+--------+---+--------+--+---------+ VertebralPSV cm/s105EDV cm/s23Antegrade +---------+--------+---+--------+--+---------+   Summary: Right Carotid: Velocities in the right ICA are consistent with a 40-59%                stenosis. Non-hemodynamically significant plaque <50% noted in                the CCA. The ECA appears <50% stenosed. Left Carotid: Velocities in the left ICA are consistent with a 1-39% stenosis.               Non-hemodynamically significant plaque <50% noted in the CCA. The               ECA appears <50% stenosed. Tortuous flow through stent rgion               causing slightly increased velocities. Vertebrals:  Bilateral vertebral arteries demonstrate antegrade flow. Subclavians: Normal flow hemodynamics were seen in bilateral subclavian              arteries. *See table(s) above for measurements and observations.     Preliminary    VAS [place] ABI WITH/WO TBI  Result Date: 05/29/2023  LOWER EXTREMITY DOPPLER STUDY Patient Name:  Barbara Contreras  Date of Exam:   05/29/2023 Medical Rec #: [id number]          Accession #:    [id number] Date of Birth: March 16, 1951          Patient Gender: F Patient Age:   84 years Exam Location:  [facility] Procedure:      VAS [place] ABI WITH/WO TBI Referring Phys:  --------------------------------------------------------------------------------  Indications: Peripheral artery disease. High Risk Factors: Hypertension, hyperlipidemia, coronary artery disease.  Vascular Interventions: 01/25/2023 Left carotid and right common iliac artery                         stenting. Comparison Study: 02/2023 Performing Technologist: Salvadore Farber RVT  Examination Guidelines: A complete evaluation includes at minimum, Doppler waveform signals and systolic blood pressure reading at the level of bilateral brachial, anterior tibial, and posterior tibial arteries, when vessel segments are accessible. Bilateral testing is considered an integral part of a complete examination. Photoelectric Plethysmograph (PPG) waveforms and toe systolic pressure readings are included as required and additional duplex testing as needed. Limited examinations for reoccurring indications may be performed as noted.  ABI Findings: +---------+------------------+-----+---------+--------+ Right    Rt Pressure (mmHg)IndexWaveform Comment  +---------+------------------+-----+---------+--------+ Brachial 108                                      +---------+------------------+-----+---------+--------+ ATA      126               1.15 triphasic         +---------+------------------+-----+---------+--------+ PTA      127               1.15 triphasic         +---------+------------------+-----+---------+--------+ Great Toe104               0.95 Normal            +---------+------------------+-----+---------+--------+ +---------+------------------+-----+---------+-------+  Left     Lt Pressure (mmHg)IndexWaveform Comment +---------+------------------+-----+---------+-------+ Brachial 110                                     +---------+------------------+-----+---------+-------+ ATA      125               1.14 triphasic        +---------+------------------+-----+---------+-------+ PTA       117               1.06 triphasic        +---------+------------------+-----+---------+-------+ Great Toe86                0.78 Normal           +---------+------------------+-----+---------+-------+ +-------+-----------+-----------+------------+------------+ ABI/TBIToday's ABIToday's TBIPrevious ABIPrevious TBI +-------+-----------+-----------+------------+------------+ Right  1.15       .95        1.01        .89          +-------+-----------+-----------+------------+------------+ Left   1.14       .78        .91         .71          +-------+-----------+-----------+------------+------------+  Left ABIs appear increased compared to prior study on 02/2023.  Summary: Right: Resting right ankle-brachial index is within normal range. The right toe-brachial index is normal. Left: Resting left ankle-brachial index is within normal range. The left toe-brachial index is normal. *See table(s) above for measurements and observations.     Preliminary      Assessment/Plan PAD (peripheral artery disease) (HCC) Her ABIs today are 1.15 on the right and 1.14 on the left with no changes from her previous study after the procedure 3 months ago.  No current worrisome symptoms.  Continue current medical regimen.  Follow-up in 6 months.  Carotid stenosis Duplex today shows stable velocities in the 40 to 59% range on the right and a patent left carotid stent without significant recurrent stenosis.  Doing well.  Continue current medical regimen.  Recheck in 6 months with carotid duplex.  Essential hypertension blood pressure control important in reducing the progression of atherosclerotic disease. On appropriate oral medications.     Dyslipidemia lipid control important in reducing the progression of atherosclerotic disease. Continue statin therapy     Tobacco use disorder Represents a significant atherosclerotic risk factor and smoking cessation would be of benefit for her vascular  disease.   Festus Barren, MD  05/29/2023 3:02 PM    This note was created with Dragon medical transcription system.  Any errors from dictation are purely unintentional

## 2023-05-29 NOTE — Assessment & Plan Note (Signed)
Duplex today shows stable velocities in the 40 to 59% range on the right and a patent left carotid stent without significant recurrent stenosis.  Doing well.  Continue current medical regimen.  Recheck in 6 months with carotid duplex.

## 2023-05-29 NOTE — Assessment & Plan Note (Signed)
Her ABIs today are 1.15 on the right and 1.14 on the left with no changes from her previous study after the procedure 3 months ago.  No current worrisome symptoms.  Continue current medical regimen.  Follow-up in 6 months.

## 2023-05-30 ENCOUNTER — Telehealth (INDEPENDENT_AMBULATORY_CARE_PROVIDER_SITE_OTHER): Payer: Self-pay | Admitting: Nurse Practitioner

## 2023-05-30 NOTE — Telephone Encounter (Signed)
Patient called and LVM stating she is needing orders faxed to this number. So they can come and pick up her oxygen tanks   She states it needs to say " discharge patient from oxygen"   Fax number [phone]      Please call and advise

## 2023-05-31 ENCOUNTER — Encounter (INDEPENDENT_AMBULATORY_CARE_PROVIDER_SITE_OTHER): Payer: Self-pay | Admitting: Nurse Practitioner

## 2023-05-31 NOTE — Telephone Encounter (Signed)
Letter on your desk

## 2023-05-31 NOTE — Telephone Encounter (Signed)
Letter faxed.

## 2023-06-22 IMAGING — US US BREAST*R* LIMITED INC AXILLA
1 series · 12 of 12 positions shown · non-contrast
Comparison: Previous exam(s).

CLINICAL DATA: Screening recall for a possible right breast mass.

EXAM:
DIGITAL DIAGNOSTIC UNILATERAL RIGHT MAMMOGRAM WITH TOMOSYNTHESIS AND
CAD; ULTRASOUND RIGHT BREAST LIMITED
TECHNIQUE: Right digital diagnostic mammography and breast tomosynthesis was
performed. The images were evaluated with computer-aided detection.;
Targeted ultrasound examination of the right breast was performed

[Series 2: us breast*right* limited inc axilla · 0.06mm/px · 12 of 12 slices shown]
[im 1/12]
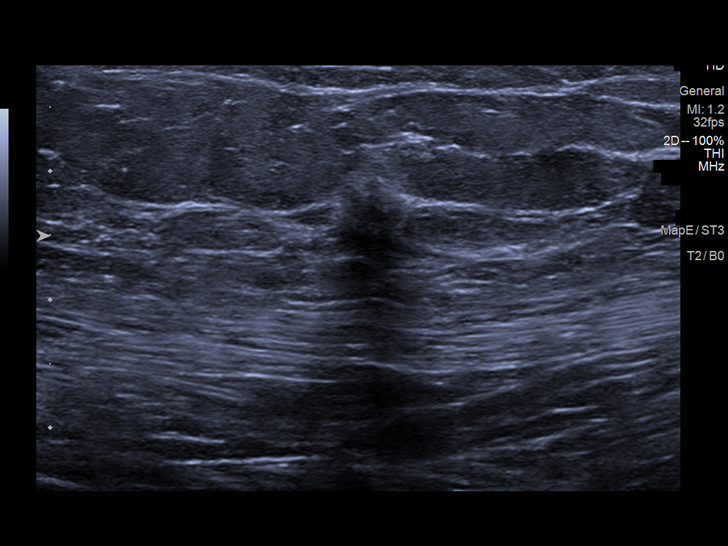
[im 2/12]
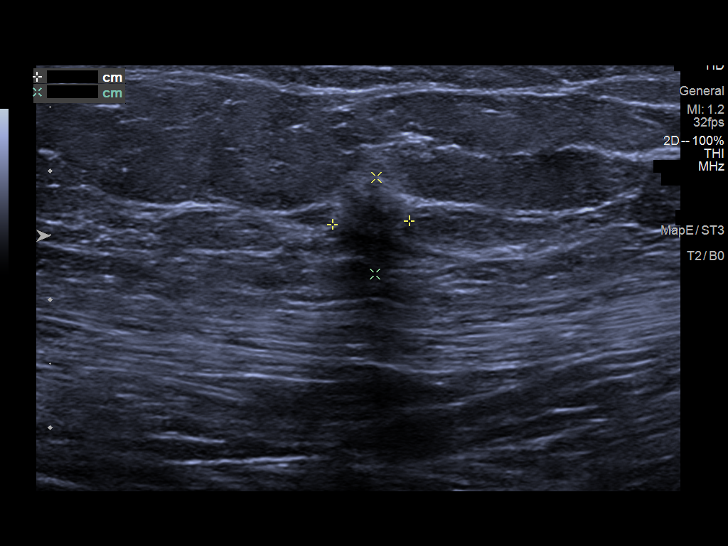
[im 3/12]
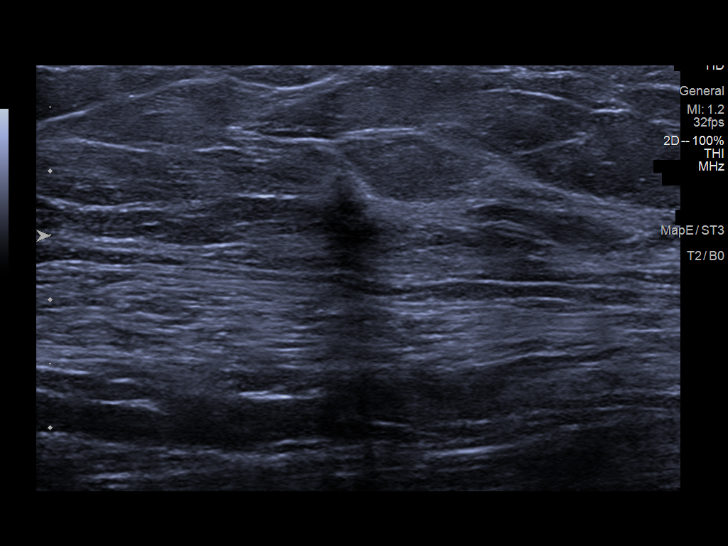
[im 4/12]
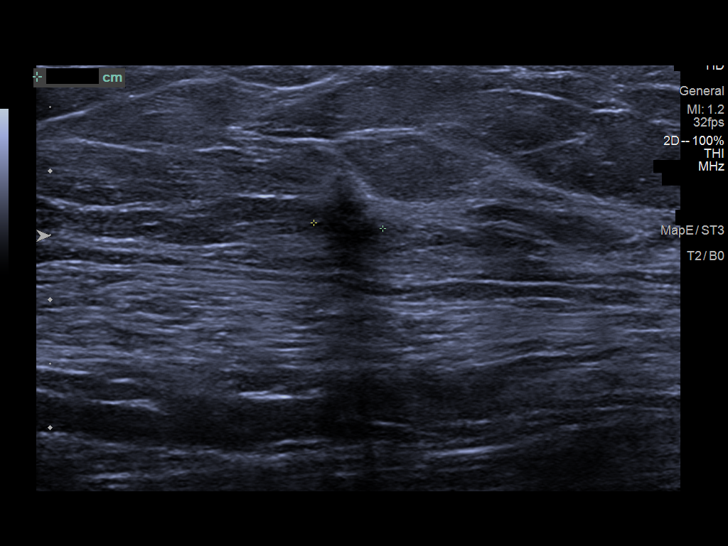
[im 5/12]
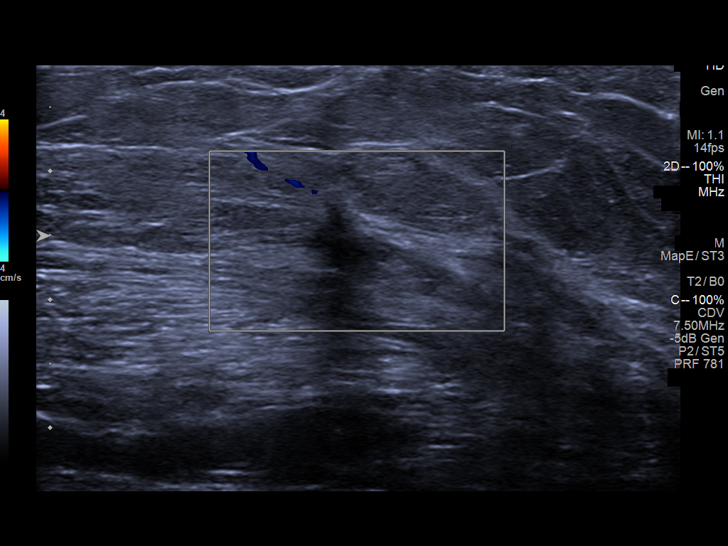
[im 6/12]
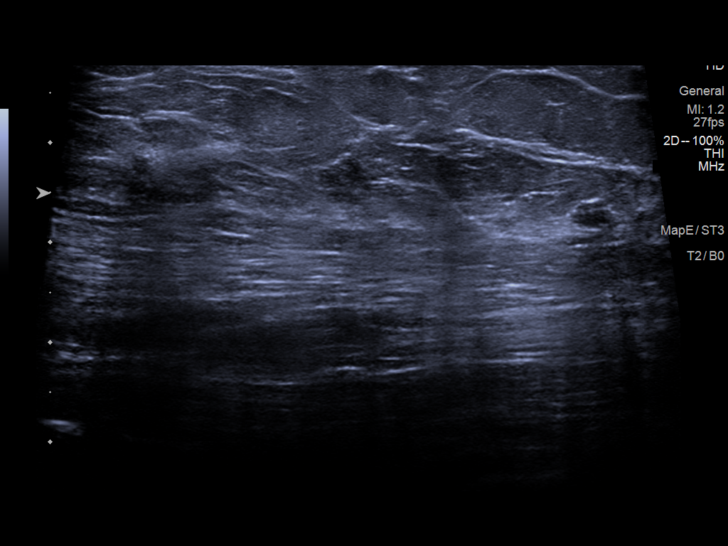
[im 7/12]
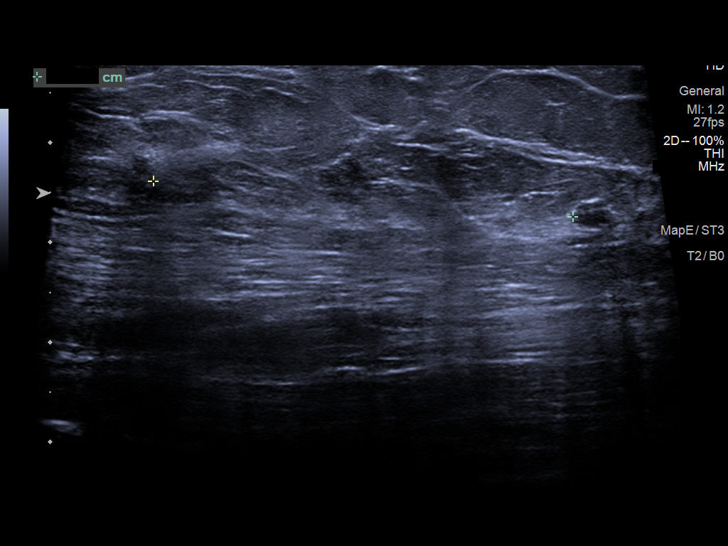
[im 8/12]
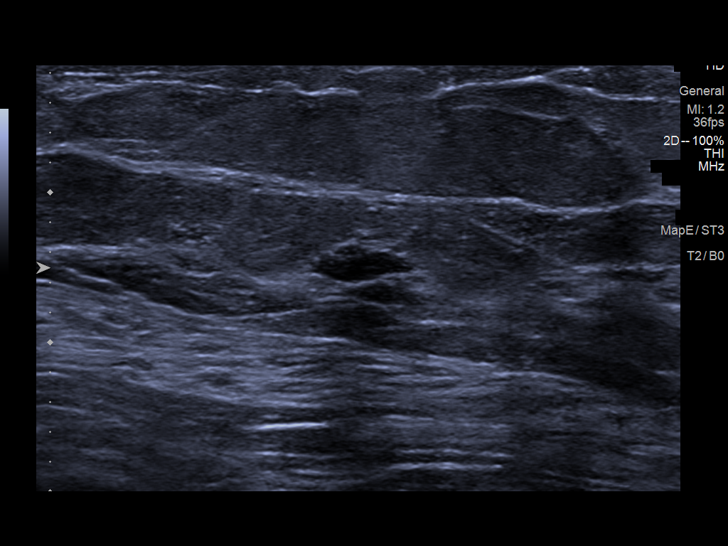
[im 9/12]
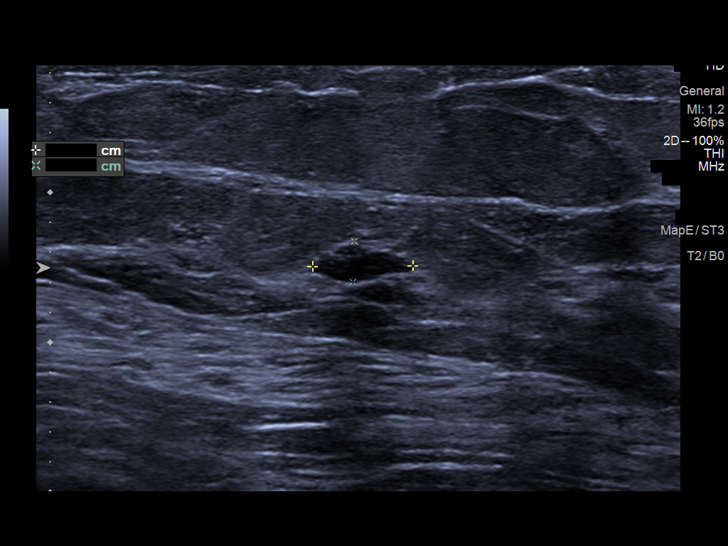
[im 10/12]
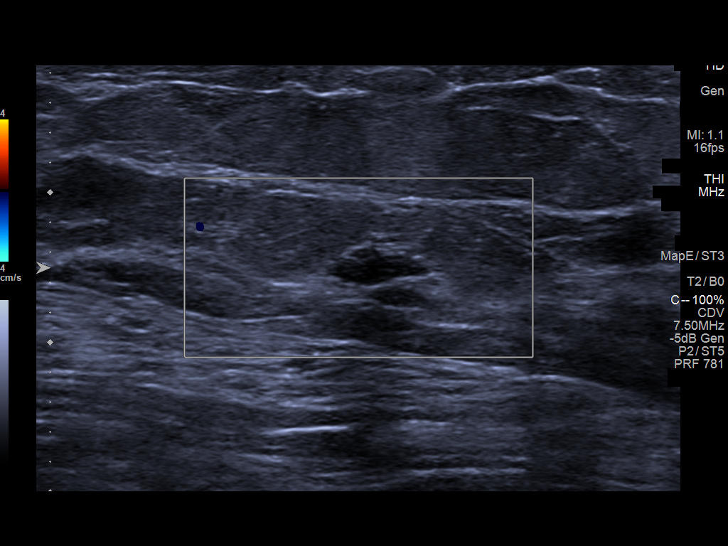
[im 11/12]
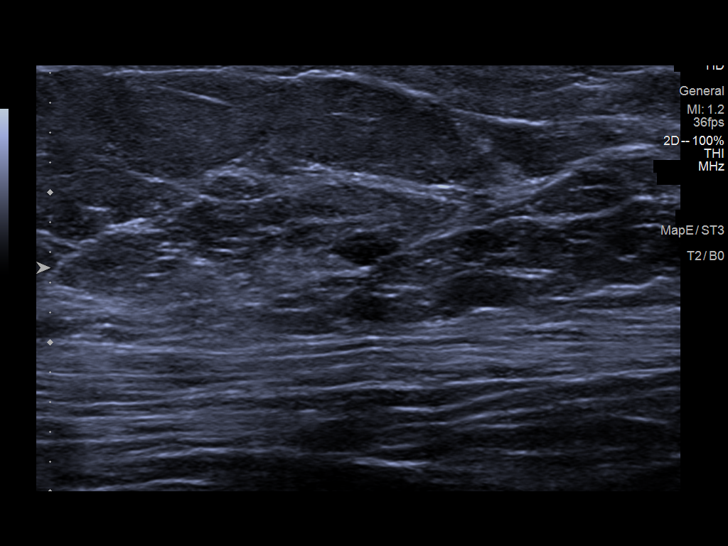
[im 12/12]
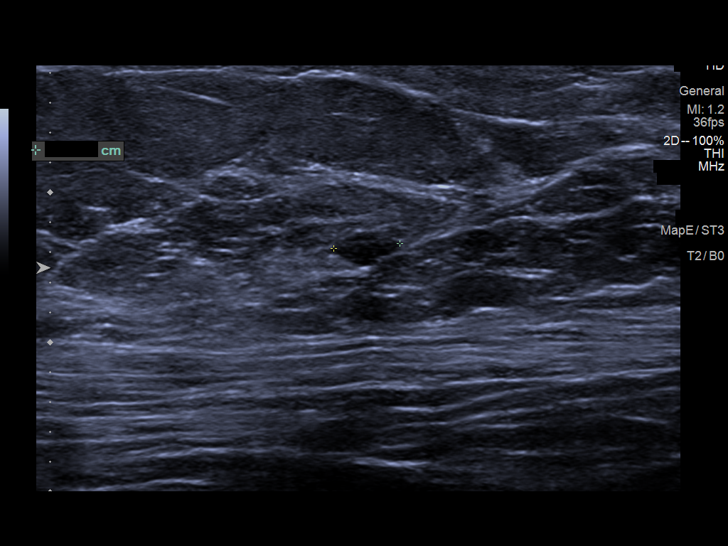

[12 of 12 positions shown; findings below may reference images not displayed]

ACR Breast Density Category c: The breast tissue is heterogeneously
dense, which may obscure small masses.
FINDINGS: Spot compression tomosynthesis images through the superior slightly
medial far posterior right breast demonstrates a 5-6 mm spiculated
mass.

Ultrasound targeted to the right breast at 1 o'clock, 8 cm from the
nipple demonstrates an irregular hypoechoic shadowing mass measuring
8 x 5 x 6 mm. At 2 o'clock, 6 cm from the nipple there is an oval
hypoechoic mass measuring 7 x 3 x 4 mm. These 2 masses are 4.2 cm
apart.

Ultrasound of right axilla demonstrates multiple normal-appearing
lymph nodes.
IMPRESSION: 1. There is a highly suspicious mass in the right breast at 1
o'clock measuring 8 mm.

2. There is an indeterminate right breast mass at 2 o'clock
measuring 7 mm.

3.  No evidence of right axillary lymphadenopathy.

RECOMMENDATION:
Ultrasound guided biopsy is recommended for the 2 right breast
masses. We will schedule the patient for the procedure at her
earliest convenience is after we obtain the appropriate order from
her physician.

I have discussed the findings and recommendations with the patient.
If applicable, a reminder letter will be sent to the patient
regarding the next appointment.

BI-RADS CATEGORY  5: Highly suggestive of malignancy.

## 2023-06-29 ENCOUNTER — Ambulatory Visit
Admission: RE | Admit: 2023-06-29 | Discharge: 2023-06-29 | Disposition: A | Discharge status: Home / Self Care | Payer: 59 | Source: Ambulatory Visit | Attending: Acute Care | Admitting: Acute Care

## 2023-06-29 DIAGNOSIS — Z87891 Personal history of nicotine dependence: Secondary | ICD-10-CM | POA: Insufficient documentation

## 2023-06-29 DIAGNOSIS — F17210 Nicotine dependence, cigarettes, uncomplicated: Secondary | ICD-10-CM | POA: Diagnosis present

## 2023-06-29 DIAGNOSIS — Z122 Encounter for screening for malignant neoplasm of respiratory organs: Secondary | ICD-10-CM | POA: Diagnosis present

## 2023-07-09 ENCOUNTER — Other Ambulatory Visit: Payer: Self-pay | Admitting: Acute Care

## 2023-07-09 DIAGNOSIS — Z122 Encounter for screening for malignant neoplasm of respiratory organs: Secondary | ICD-10-CM

## 2023-07-09 DIAGNOSIS — Z87891 Personal history of nicotine dependence: Secondary | ICD-10-CM

## 2023-07-09 DIAGNOSIS — F17210 Nicotine dependence, cigarettes, uncomplicated: Secondary | ICD-10-CM

## 2023-07-30 ENCOUNTER — Other Ambulatory Visit (INDEPENDENT_AMBULATORY_CARE_PROVIDER_SITE_OTHER): Payer: Self-pay | Admitting: Vascular Surgery

## 2023-08-07 ENCOUNTER — Ambulatory Visit
Admission: RE | Admit: 2023-08-07 | Discharge: 2023-08-07 | Disposition: A | Discharge status: Home / Self Care | Payer: 59 | Source: Ambulatory Visit | Attending: Internal Medicine | Admitting: Internal Medicine

## 2023-08-07 DIAGNOSIS — Z170 Estrogen receptor positive status [ER+]: Secondary | ICD-10-CM | POA: Insufficient documentation

## 2023-08-07 DIAGNOSIS — Z13820 Encounter for screening for osteoporosis: Secondary | ICD-10-CM | POA: Insufficient documentation

## 2023-08-07 DIAGNOSIS — Z780 Asymptomatic menopausal state: Secondary | ICD-10-CM | POA: Diagnosis not present

## 2023-08-07 DIAGNOSIS — C50411 Malignant neoplasm of upper-outer quadrant of right female breast: Secondary | ICD-10-CM | POA: Insufficient documentation

## 2023-08-07 HISTORY — DX: Personal history of irradiation: Z92.3

## 2023-08-16 ENCOUNTER — Encounter: Payer: Self-pay | Admitting: *Deleted

## 2023-08-17 ENCOUNTER — Encounter
Admission: RE | Disposition: A | Discharge status: Home / Self Care | Payer: Self-pay | Source: Home / Self Care | Attending: Gastroenterology

## 2023-08-17 ENCOUNTER — Ambulatory Visit
Admission: RE | Admit: 2023-08-17 | Discharge: 2023-08-17 | Disposition: A | Discharge status: Home / Self Care | Payer: 59 | Attending: Gastroenterology | Admitting: Gastroenterology

## 2023-08-17 ENCOUNTER — Encounter: Payer: Self-pay | Admitting: *Deleted

## 2023-08-17 ENCOUNTER — Ambulatory Visit: Payer: 59

## 2023-08-17 DIAGNOSIS — D122 Benign neoplasm of ascending colon: Secondary | ICD-10-CM | POA: Insufficient documentation

## 2023-08-17 DIAGNOSIS — I2510 Atherosclerotic heart disease of native coronary artery without angina pectoris: Secondary | ICD-10-CM | POA: Diagnosis not present

## 2023-08-17 DIAGNOSIS — Z86010 Personal history of colonic polyps: Secondary | ICD-10-CM | POA: Diagnosis not present

## 2023-08-17 DIAGNOSIS — Z1211 Encounter for screening for malignant neoplasm of colon: Secondary | ICD-10-CM | POA: Insufficient documentation

## 2023-08-17 DIAGNOSIS — K5730 Diverticulosis of large intestine without perforation or abscess without bleeding: Secondary | ICD-10-CM | POA: Diagnosis not present

## 2023-08-17 DIAGNOSIS — K5520 Angiodysplasia of colon without hemorrhage: Secondary | ICD-10-CM | POA: Diagnosis not present

## 2023-08-17 DIAGNOSIS — Z7902 Long term (current) use of antithrombotics/antiplatelets: Secondary | ICD-10-CM | POA: Diagnosis not present

## 2023-08-17 DIAGNOSIS — F17200 Nicotine dependence, unspecified, uncomplicated: Secondary | ICD-10-CM | POA: Diagnosis not present

## 2023-08-17 DIAGNOSIS — K219 Gastro-esophageal reflux disease without esophagitis: Secondary | ICD-10-CM | POA: Diagnosis not present

## 2023-08-17 DIAGNOSIS — K640 First degree hemorrhoids: Secondary | ICD-10-CM | POA: Insufficient documentation

## 2023-08-17 DIAGNOSIS — I10 Essential (primary) hypertension: Secondary | ICD-10-CM | POA: Insufficient documentation

## 2023-08-17 DIAGNOSIS — Z923 Personal history of irradiation: Secondary | ICD-10-CM | POA: Diagnosis not present

## 2023-08-17 DIAGNOSIS — Z853 Personal history of malignant neoplasm of breast: Secondary | ICD-10-CM | POA: Diagnosis not present

## 2023-08-17 DIAGNOSIS — J439 Emphysema, unspecified: Secondary | ICD-10-CM | POA: Diagnosis not present

## 2023-08-17 DIAGNOSIS — D120 Benign neoplasm of cecum: Secondary | ICD-10-CM | POA: Diagnosis not present

## 2023-08-17 DIAGNOSIS — D123 Benign neoplasm of transverse colon: Secondary | ICD-10-CM | POA: Diagnosis not present

## 2023-08-17 DIAGNOSIS — K589 Irritable bowel syndrome without diarrhea: Secondary | ICD-10-CM | POA: Insufficient documentation

## 2023-08-17 HISTORY — PX: SUBMUCOSAL LIFTING INJECTION: SHX6855

## 2023-08-17 HISTORY — PX: HEMOSTASIS CLIP PLACEMENT: SHX6857

## 2023-08-17 HISTORY — PX: POLYPECTOMY: SHX5525

## 2023-08-17 HISTORY — PX: COLONOSCOPY WITH PROPOFOL: SHX5780

## 2023-08-17 SURGERY — COLONOSCOPY WITH PROPOFOL
Anesthesia: General

## 2023-08-17 MED ORDER — LIDOCAINE HCL (PF) 2 % IJ SOLN
INTRAMUSCULAR | Status: DC | PRN
  Administered 2023-08-17: 60 mg via INTRADERMAL

## 2023-08-17 MED ORDER — PHENYLEPHRINE 80 MCG/ML (10ML) SYRINGE FOR IV PUSH (FOR BLOOD PRESSURE SUPPORT)
PREFILLED_SYRINGE | INTRAVENOUS | Status: AC
  Filled 2023-08-17: qty 20

## 2023-08-17 MED ORDER — PROPOFOL 500 MG/50ML IV EMUL
INTRAVENOUS | Status: DC | PRN
  Administered 2023-08-17: 50 mg via INTRAVENOUS
  Administered 2023-08-17: 150 ug/kg/min via INTRAVENOUS

## 2023-08-17 MED ORDER — SODIUM CHLORIDE 0.9 % IV SOLN: INTRAVENOUS | Status: DC

## 2023-08-17 MED ORDER — PHENYLEPHRINE HCL (PRESSORS) 10 MG/ML IV SOLN
INTRAVENOUS | Status: DC | PRN
Start: 2023-08-17 — End: 2023-08-17
  Administered 2023-08-17 (×5): 80 ug via INTRAVENOUS

## 2023-08-17 MED ORDER — LIDOCAINE HCL (PF) 2 % IJ SOLN
INTRAMUSCULAR | Status: AC
  Filled 2023-08-17: qty 5

## 2023-08-17 NOTE — Interval H&P Note (Signed)
History and Physical Interval Note:  08/17/2023 12:27 PM  Barbara Contreras  has presented today for surgery, with the diagnosis of HX OF ADENOMATOUS POLYP.  The various methods of treatment have been discussed with the patient and family. After consideration of risks, benefits and other options for treatment, the patient has consented to  Procedure(s): COLONOSCOPY WITH PROPOFOL (N/A) as a surgical intervention.  The patient's history has been reviewed, patient examined, no change in status, stable for surgery.  I have reviewed the patient's chart and labs.  Questions were answered to the patient's satisfaction.     Regis Bill  Ok to proceed with colonoscopy

## 2023-08-17 NOTE — Op Note (Addendum)
[facility] Patient Name: Barbara Contreras Procedure Date: 08/17/2023 11:33 AM MRN: [id number] Account #: [id number] Date of Birth: 07/04/1951 Admit Type: Outpatient Age: 72 Room: [facility] ENDO ROOM 3 Gender: Female Note Status: Finalized Instrument Name: Nelda Marseille [id number] Procedure:             Colonoscopy Indications:           Surveillance: Personal history of adenomatous polyps                         on last colonoscopy 5 years ago Providers:             Eather Colas MD, MD Medicines:             Monitored Anesthesia Care Complications:         No immediate complications. Estimated blood loss:                         Minimal. Procedure:             Pre-Anesthesia Assessment:                        - Prior to the procedure, a History and Physical was                         performed, and patient medications and allergies were                         reviewed. The patient is competent. The risks and                         benefits of the procedure and the sedation options and                         risks were discussed with the patient. All questions                         were answered and informed consent was obtained.                         Patient identification and proposed procedure were                         verified by the physician, the nurse, the                         anesthesiologist, the anesthetist and the technician                         in the endoscopy suite. Mental Status Examination:                         alert and oriented. Airway Examination: normal                         oropharyngeal airway and neck mobility. Respiratory                         Examination: clear to auscultation. CV Examination:  normal. Prophylactic Antibiotics: The patient does not                         require prophylactic antibiotics. Prior                         Anticoagulants: The patient has taken Plavix                          (clopidogrel), last dose was 7 days prior to                         procedure. ASA Grade Assessment: III - A patient with                         severe systemic disease. After reviewing the risks and                         benefits, the patient was deemed in satisfactory                         condition to undergo the procedure. The anesthesia                         plan was to use monitored anesthesia care (MAC).                         Immediately prior to administration of medications,                         the patient was re-assessed for adequacy to receive                         sedatives. The heart rate, respiratory rate, oxygen                         saturations, blood pressure, adequacy of pulmonary                         ventilation, and response to care were monitored                         throughout the procedure. The physical status of the                         patient was re-assessed after the procedure.                        After obtaining informed consent, the colonoscope was                         passed under direct vision. Throughout the procedure,                         the patient's blood pressure, pulse, and oxygen                         saturations were monitored continuously. The  Colonoscope was introduced through the anus and                         advanced to the the cecum, identified by appendiceal                         orifice and ileocecal valve. The colonoscopy was                         somewhat difficult due to a tortuous colon. The                         patient tolerated the procedure well. The quality of                         the bowel preparation was good. The ileocecal valve,                         appendiceal orifice, and rectum were photographed. Findings:      The perianal and digital rectal examinations were normal.      A single small localized angioectasia without bleeding was  found in the       cecum.      Three sessile polyps were found in the ileocecal valve. The polyps were       1 to 2 mm in size. These polyps were removed with a cold snare.       Resection and retrieval were complete. Estimated blood loss was minimal.      A 5 mm polyp was found in the distal ascending colon. The polyp was       sessile. The polyp was removed with a cold snare. Resection and       retrieval were complete. Estimated blood loss was minimal.      An 11 mm polyp was found in the mid transverse colon. The polyp was       sessile. Preparations were made for mucosal resection. Demarcation of       the lesion was performed with narrow band imaging to clearly identify       the boundaries of the lesion. Eleview was injected to raise the lesion.       Snare mucosal resection was performed. Resection and retrieval were       complete. Resected tissue margins were examined and clear of polyp       tissue. To prevent bleeding post-intervention, two hemostatic clips were       successfully placed. There was no bleeding during, or at the end, of the       procedure.      There was a small lipoma, 10 mm in diameter, in the descending colon.      Multiple large-mouthed and small-mouthed diverticula were found in the       sigmoid colon.      Internal hemorrhoids were found during retroflexion. The hemorrhoids       were Grade I (internal hemorrhoids that do not prolapse).      The exam was otherwise without abnormality on direct and retroflexion       views. Impression:            - A single non-bleeding colonic angioectasia.                        -  Three 1 to 2 mm polyps at the ileocecal valve,                         removed with a cold snare. Resected and retrieved.                        - One 5 mm polyp in the distal ascending colon,                         removed with a cold snare. Resected and retrieved.                        - One 11 mm polyp in the mid transverse colon,  removed                         with mucosal resection. Resected and retrieved. Clips                         were placed.                        - Small lipoma in the descending colon.                        - Diverticulosis in the sigmoid colon.                        - Internal hemorrhoids.                        - The examination was otherwise normal on direct and                         retroflexion views.                        - Mucosal resection was performed. Resection and                         retrieval were complete. Recommendation:        - Discharge patient to home.                        - Resume previous diet.                        - Resume Plavix (clopidogrel) at prior dose in 2 days.                        - Continue present medications.                        - Await pathology results.                        - Repeat colonoscopy in 3 years for surveillance.                        - Return to referring physician as previously  scheduled. Procedure Code(s):     --- Professional ---                        [phone], Colonoscopy, flexible; with endoscopic mucosal                         resection                        [phone], 59, Colonoscopy, flexible; with removal of                         tumor(s), polyp(s), or other lesion(s) by snare                         technique Diagnosis Code(s):     --- Professional ---                        Z86.010, Personal history of colonic polyps                        K55.20, Angiodysplasia of colon without hemorrhage                        D12.0, Benign neoplasm of cecum                        D12.2, Benign neoplasm of ascending colon                        D12.3, Benign neoplasm of transverse colon (hepatic                         flexure or splenic flexure)                        D17.5, Benign lipomatous neoplasm of intra-abdominal                         organs                        K64.0, First degree  hemorrhoids                        K57.30, Diverticulosis of large intestine without                         perforation or abscess without bleeding CPT copyright 2022 American [employer]. All rights reserved. The codes documented in this report are preliminary and upon coder review may  be revised to meet current compliance requirements. Eather Colas MD, MD 08/17/2023 1:15:19 PM Number of Addenda: 0 Note Initiated On: 08/17/2023 11:33 AM Scope Withdrawal Time: 0 hours 26 minutes 19 seconds  Total Procedure Duration: 0 hours 35 minutes 59 seconds  Estimated Blood Loss:  Estimated blood loss was minimal.      [facility]

## 2023-08-17 NOTE — Anesthesia Postprocedure Evaluation (Signed)
Anesthesia Post Note  Patient: Barbara Contreras  Procedure(s) Performed: COLONOSCOPY WITH PROPOFOL POLYPECTOMY SUBMUCOSAL LIFTING INJECTION HEMOSTASIS CLIP PLACEMENT  Patient location during evaluation: Endoscopy Anesthesia Type: General Level of consciousness: awake and alert Pain management: pain level controlled Vital Signs Assessment: post-procedure vital signs reviewed and stable Respiratory status: spontaneous breathing, nonlabored ventilation, respiratory function stable and patient connected to nasal cannula oxygen Cardiovascular status: blood pressure returned to baseline and stable Postop Assessment: no apparent nausea or vomiting Anesthetic complications: no   There were no known notable events for this encounter.   Last Vitals:  Vitals:   08/17/23 1313 08/17/23 1323  BP: (!) 103/58   Pulse:  65  Resp:    Temp: 36.8 C   SpO2: 93% 94%    Last Pain:  Vitals:   08/17/23 1323  TempSrc:   PainSc: 0-No pain                 Louie Boston

## 2023-08-17 NOTE — Anesthesia Preprocedure Evaluation (Signed)
Anesthesia Evaluation  Patient identified by MRN, date of birth, ID band Patient awake    Reviewed: Allergy & Precautions, NPO status , Patient's Chart, lab work & pertinent test results  History of Anesthesia Complications Negative for: history of anesthetic complications  Airway Mallampati: II  TM Distance: >3 FB Neck ROM: full    Dental no notable dental hx.    Pulmonary asthma , COPD,  COPD inhaler, Current Smoker   Pulmonary exam normal        Cardiovascular hypertension, + CAD and + Peripheral Vascular Disease  Normal cardiovascular exam+ Valvular Problems/Murmurs      Neuro/Psych Seizures -, Well Controlled,  PSYCHIATRIC DISORDERS  Depression     Neuromuscular disease    GI/Hepatic Neg liver ROS,GERD  Medicated,,  Endo/Other  negative endocrine ROS    Renal/GU negative Renal ROS  negative genitourinary   Musculoskeletal  (+) Arthritis ,    Abdominal Normal abdominal exam  (+)   Peds  Hematology negative hematology ROS (+)   Anesthesia Other Findings Past Medical History: No date: Arthritis No date: Asthma No date: Back pain 2022: Breast cancer (HCC) No date: Carotid artery stenosis No date: Carotid stenosis No date: Coronary artery disease No date: Depression No date: Emphysema of lung (HCC) No date: GERD (gastroesophageal reflux disease) No date: Heart murmur No date: Hip pain No date: History of Lyme disease No date: HLD (hyperlipidemia) No date: Hyperlipemia No date: Hypertension No date: IBS (irritable bowel syndrome) No date: IBS (irritable bowel syndrome) No date: Lyme disease No date: Personal history of radiation therapy No date: Pre-diabetes No date: Sciatica No date: Sciatica No date: Seizures (HCC) No date: Tremor No date: Vitamin D deficiency No date: Vitamin D deficiency  Past Surgical History: No date: ABDOMINAL HYSTERECTOMY 07/22/2021: BREAST BIOPSY; Right     Comment:   [place] bx 1:00 area, venus marker, IMC tubular features 07/22/2021: BREAST BIOPSY; Right     Comment:  [place] bx 2:00 ribbon marker, fibroadenoma 08/24/2021: BREAST LUMPECTOMY; Right     Comment:  re-excision 08/12/2021: BREAST LUMPECTOMY; Right No date: CAROTID ENDARTERECTOMY; Right 01/25/2023: CAROTID PTA/STENT INTERVENTION; Left     Comment:  Procedure: CAROTID PTA/STENT INTERVENTION;  Surgeon:               Annice Needy, MD;  Location: [facility] INVASIVE CV LAB;                Service: Cardiovascular;  Laterality: Left; No date: CHOLECYSTECTOMY 09/13/2018: COLONOSCOPY WITH PROPOFOL; N/A     Comment:  Procedure: COLONOSCOPY WITH PROPOFOL;  Surgeon:               Christena Deem, MD;  Location: [facility] ENDOSCOPY;                Service: Endoscopy;  Laterality: N/A; 05/03/2021: ESOPHAGOGASTRODUODENOSCOPY; N/A     Comment:  Procedure: ESOPHAGOGASTRODUODENOSCOPY (EGD);  Surgeon:               Regis Bill, MD;  Location: [facility] ENDOSCOPY;                Service: Endoscopy;  Laterality: N/A; No date: paranasal sinusotomy 08/12/2021: PART MASTECTOMY,RADIO FREQUENCY LOCALIZER,AXILLARY  SENTINEL NODE BIOPSY; Right     Comment:  Procedure: PART MASTECTOMY,RADIO FREQUENCY               LOCALIZER,AXILLARY SENTINEL NODE BIOPSY;  Surgeon: Sung Amabile, DO;  Location: [facility] ORS;  Service: General;                Laterality: Right; 08/24/2021: RE-EXCISION OF BREAST CANCER,SUPERIOR MARGINS; Right     Comment:  Procedure: RE-EXCISION OF BREAST CANCER,SUPERIOR               MARGINS;  Surgeon: Sung Amabile, DO;  Location: [facility] ORS;              Service: General;  Laterality: Right;  BMI    Body Mass Index: 25.62 kg/m      Reproductive/Obstetrics negative OB ROS                             Anesthesia Physical Anesthesia Plan  ASA: 3  Anesthesia Plan: General   Post-op Pain Management: Minimal or no pain anticipated   Induction: Intravenous  PONV Risk Score  and Plan: 2 and Propofol infusion and TIVA  Airway Management Planned: Natural Airway and Nasal Cannula  Additional Equipment:   Intra-op Plan:   Post-operative Plan:   Informed Consent: I have reviewed the patients History and Physical, chart, labs and discussed the procedure including the risks, benefits and alternatives for the proposed anesthesia with the patient or authorized representative who has indicated his/her understanding and acceptance.     Dental Advisory Given  Plan Discussed with: Anesthesiologist, CRNA and Surgeon  Anesthesia Plan Comments: (Patient consented for risks of anesthesia including but not limited to:  - adverse reactions to medications - risk of airway placement if required - damage to eyes, teeth, lips or other oral mucosa - nerve damage due to positioning  - sore throat or hoarseness - Damage to heart, brain, nerves, lungs, other parts of body or loss of life  Patient voiced understanding.)       Anesthesia Quick Evaluation

## 2023-08-17 NOTE — Transfer of Care (Signed)
Immediate Anesthesia Transfer of Care Note  Patient: Barbara Contreras  Procedure(s) Performed: COLONOSCOPY WITH PROPOFOL POLYPECTOMY SUBMUCOSAL LIFTING INJECTION HEMOSTASIS CLIP PLACEMENT  Patient Location: PACU  Anesthesia Type:General  Level of Consciousness: awake  Airway & Oxygen Therapy: Patient Spontanous Breathing  Post-op Assessment: Report given to RN and Post -op Vital signs reviewed and stable  Post vital signs: Reviewed and stable  Last Vitals:  Vitals Value Taken Time  BP 103/58 08/17/23 1316  Temp 36.8 C 08/17/23 1313  Pulse 68 08/17/23 1315  Resp 22 08/17/23 1316  SpO2 93 % 08/17/23 1315  Vitals shown include unfiled device data.  Last Pain:  Vitals:   08/17/23 1313  TempSrc: Temporal  PainSc: 0-No pain         Complications: There were no known notable events for this encounter.

## 2023-08-17 NOTE — H&P (Signed)
Outpatient short stay form Pre-procedure 08/17/2023  Barbara Bill, MD  Primary Physician: [name], MD  Reason for visit:  Surveillance  History of present illness:  72 y/o lady with history of hypertension, HLD, and carotid artery stenosis on plavix here for colonoscopy for history of adenomatous polyps. Last colonoscopy in 2019 was unremarkable. Last dose of plavix was over 5 days ago. History of hysterectomy and cholecystectomy. No family history of GI malignancies.    Current Facility-Administered Medications:    0.9 %  sodium chloride infusion, , Intravenous, Continuous, Angelia Hazell, Rossie Muskrat, MD  Medications Prior to Admission  Medication Sig Dispense Refill Last Dose   amLODipine (NORVASC) 5 MG tablet Take 5 mg by mouth daily.   08/16/2023   anastrozole (ARIMIDEX) 1 MG tablet TAKE 1 TABLET BY MOUTH EVERY DAY 90 tablet 0 08/16/2023   aspirin EC 81 MG tablet Take 81 mg by mouth daily.   Past Week   atorvastatin (LIPITOR) 80 MG tablet Take 80 mg by mouth daily.   08/16/2023   baclofen (LIORESAL) 10 MG tablet Take 5-10 mg by mouth at bedtime as needed for muscle spasms.   08/16/2023   calcium carbonate (TUMS EX) 750 MG chewable tablet Chew 1 tablet by mouth daily as needed for heartburn.   Past Week   Cholecalciferol (VITAMIN D3) 10 MCG (400 UNIT) tablet Take 400 Units by mouth daily.   Past Week   clobetasol (TEMOVATE) 0.05 % external solution Apply 1 Application topically daily.   Past Week   dicyclomine (BENTYL) 10 MG capsule Take 10 mg by mouth in the morning and at bedtime.   Past Week   docusate sodium (COLACE) 100 MG capsule Take 100 mg by mouth 2 (two) times daily.   Past Month   famotidine (PEPCID) 20 MG tablet Take 20 mg by mouth 2 (two) times daily.   08/16/2023   fluticasone (FLONASE) 50 MCG/ACT nasal spray Place 2 sprays into both nostrils as needed.   08/16/2023   gabapentin (NEURONTIN) 600 MG tablet Take 600 mg by mouth 3 (three) times daily.  5 08/16/2023    ketoconazole (NIZORAL) 2 % shampoo Apply 1 application topically once a week.   Past Week   lisinopril-hydrochlorothiazide (ZESTORETIC) 20-12.5 MG tablet Take 1 tablet by mouth in the morning and at bedtime. Hold until you see your doctor   08/16/2023   Multiple Vitamin (MULTIVITAMIN WITH MINERALS) TABS tablet Take 1 tablet by mouth daily.   Past Week   venlafaxine XR (EFFEXOR-XR) 75 MG 24 hr capsule Take 75 mg by mouth 3 (three) times daily.   08/16/2023   vitamin B-12 (CYANOCOBALAMIN) 1000 MCG tablet Take 1,000 mcg by mouth daily.   Past Week   Zinc Sulfate (ZINC 15 PO) Take 15 mg by mouth daily.   Past Week   albuterol (VENTOLIN HFA) 108 (90 Base) MCG/ACT inhaler Inhale 1-2 puffs into the lungs every 6 (six) hours as needed for wheezing or shortness of breath.    at prn   clopidogrel (PLAVIX) 75 MG tablet TAKE 1 TABLET BY MOUTH EVERY DAY 90 tablet 2 08/10/2023     Allergies  Allergen Reactions   Morphine And Codeine Itching   Clindamycin Other (See Comments)   Erythromycin     Other reaction(s): Other (see comments)   Other     Other reaction(s): Other (See Comments) Redness and itching   Penicillins Other (See Comments)    TOLERATED CEFAZOLIN PRIOR Reaction: unknown Has patient had a  PCN reaction causing immediate rash, facial/tongue/throat swelling, SOB or lightheadedness with hypotension: Unknown Has patient had a PCN reaction causing severe rash involving mucus membranes or skin necrosis: Unknown Has patient had a PCN reaction that required hospitalization: Unknown Has patient had a PCN reaction occurring within the last 10 years: no If all of the above answers are "NO", then may proceed with Cephalosporin use.   Promethazine-Phenylephrine Other (See Comments)    Muscle spasm   Tape Other (See Comments)    Redness and itching     Past Medical History:  Diagnosis Date   Arthritis    Asthma    Back pain    Breast cancer (HCC) 2022   Carotid artery stenosis    Carotid  stenosis    Coronary artery disease    Depression    Emphysema of lung (HCC)    GERD (gastroesophageal reflux disease)    Heart murmur    Hip pain    History of Lyme disease    HLD (hyperlipidemia)    Hyperlipemia    Hypertension    IBS (irritable bowel syndrome)    IBS (irritable bowel syndrome)    Lyme disease    Personal history of radiation therapy    Pre-diabetes    Sciatica    Sciatica    Seizures (HCC)    Tremor    Vitamin D deficiency    Vitamin D deficiency     Review of systems:  Otherwise negative.    Physical Exam  Gen: Alert, oriented. Appears stated age.  HEENT: PERRLA. Lungs: No respiratory distress CV: RRR Abd: soft, benign, no masses Ext: No edema    Planned procedures: Proceed with colonoscopy. The patient understands the nature of the planned procedure, indications, risks, alternatives and potential complications including but not limited to bleeding, infection, perforation, damage to internal organs and possible oversedation/side effects from anesthesia. The patient agrees and gives consent to proceed.  Please refer to procedure notes for findings, recommendations and patient disposition/instructions.     Barbara Bill, MD [facility]astroenterology

## 2023-08-20 ENCOUNTER — Encounter: Payer: Self-pay | Admitting: Gastroenterology

## 2023-08-20 LAB — SURGICAL PATHOLOGY

## 2023-08-29 ENCOUNTER — Ambulatory Visit: Payer: 59 | Admitting: Radiation Oncology

## 2023-09-07 ENCOUNTER — Inpatient Hospital Stay: Payer: 59

## 2023-09-07 ENCOUNTER — Encounter: Payer: Self-pay | Admitting: Internal Medicine

## 2023-09-07 ENCOUNTER — Inpatient Hospital Stay: Payer: 59 | Attending: Internal Medicine | Admitting: Internal Medicine

## 2023-09-07 VITALS — BP 103/68 | HR 88 | Temp 97.7°F | Ht 65.5 in | Wt 158.0 lb

## 2023-09-07 DIAGNOSIS — F17210 Nicotine dependence, cigarettes, uncomplicated: Secondary | ICD-10-CM | POA: Diagnosis not present

## 2023-09-07 DIAGNOSIS — C50411 Malignant neoplasm of upper-outer quadrant of right female breast: Secondary | ICD-10-CM | POA: Diagnosis not present

## 2023-09-07 DIAGNOSIS — Z923 Personal history of irradiation: Secondary | ICD-10-CM | POA: Insufficient documentation

## 2023-09-07 DIAGNOSIS — Z7982 Long term (current) use of aspirin: Secondary | ICD-10-CM | POA: Diagnosis not present

## 2023-09-07 DIAGNOSIS — M810 Age-related osteoporosis without current pathological fracture: Secondary | ICD-10-CM | POA: Diagnosis not present

## 2023-09-07 DIAGNOSIS — Z170 Estrogen receptor positive status [ER+]: Secondary | ICD-10-CM | POA: Diagnosis not present

## 2023-09-07 DIAGNOSIS — Z79811 Long term (current) use of aromatase inhibitors: Secondary | ICD-10-CM | POA: Diagnosis not present

## 2023-09-07 DIAGNOSIS — Z79899 Other long term (current) drug therapy: Secondary | ICD-10-CM | POA: Diagnosis not present

## 2023-09-07 LAB — CMP (CANCER CENTER ONLY)
ALT: 17 U/L (ref 0–44)
AST: 19 U/L (ref 15–41)
Albumin: 3.8 g/dL (ref 3.5–5.0)
Alkaline Phosphatase: 66 U/L (ref 38–126)
Anion gap: 8 (ref 5–15)
BUN: 24 mg/dL — ABNORMAL HIGH (ref 8–23)
CO2: 29 mmol/L (ref 22–32)
Calcium: 9.1 mg/dL (ref 8.9–10.3)
Chloride: 101 mmol/L (ref 98–111)
Creatinine: 0.97 mg/dL (ref 0.44–1.00)
GFR, Estimated: >60 mL/min (ref >60)
Glucose, Bld: 140 mg/dL — ABNORMAL HIGH (ref 70–99)
Potassium: 4.1 mmol/L (ref 3.5–5.1)
Sodium: 138 mmol/L (ref 135–145)
Total Bilirubin: 0.6 mg/dL (ref 0.3–1.2)
Total Protein: 7 g/dL (ref 6.5–8.1)

## 2023-09-07 LAB — CBC WITH DIFFERENTIAL (CANCER CENTER ONLY)
Abs Immature Granulocytes: 0.03 10*3/uL (ref 0.00–0.07)
Basophils Absolute: 0 10*3/uL (ref 0.0–0.1)
Basophils Relative: 1 %
Eosinophils Absolute: 0.1 10*3/uL (ref 0.0–0.5)
Eosinophils Relative: 2 %
HCT: 40.7 % (ref 36.0–46.0)
Hemoglobin: 13.2 g/dL (ref 12.0–15.0)
Immature Granulocytes: 1 %
Lymphocytes Relative: 23 %
Lymphs Abs: 1.4 10*3/uL (ref 0.7–4.0)
MCH: 30.5 pg (ref 26.0–34.0)
MCHC: 32.4 g/dL (ref 30.0–36.0)
MCV: 94 fL (ref 80.0–100.0)
Monocytes Absolute: 0.5 10*3/uL (ref 0.1–1.0)
Monocytes Relative: 7 %
Neutro Abs: 4.1 10*3/uL (ref 1.7–7.7)
Neutrophils Relative %: 66 %
Platelet Count: 203 10*3/uL (ref 150–400)
RBC: 4.33 MIL/uL (ref 3.87–5.11)
RDW: 13.3 % (ref 11.5–15.5)
WBC Count: 6.1 10*3/uL (ref 4.0–10.5)
nRBC: 0 % (ref 0.0–0.2)

## 2023-09-07 NOTE — Progress Notes (Signed)
Feb 2024 caroid artery surgery.

## 2023-09-07 NOTE — Progress Notes (Signed)
[facility] CONSULT NOTE  Patient Care Team: Zada Finders Joycie Peek, MD as PCP - General (Family Medicine) Jim Like, RN as Oncology Nurse Navigator Earna Coder, MD as Consulting Physician (Oncology) [name], MD as Consulting Physician (Radiation Oncology) Mertie Moores, MD as Referring Physician (Pulmonary Disease) Sung Amabile, DO as Consulting Physician (Surgery) Lamar Blinks, MD as Consulting Physician (Cardiology)  CHIEF COMPLAINTS/PURPOSE OF CONSULTATION: Breast cancer  #  Oncology History Overview Note  # [facility] -[facility]; G-1 ER > 90%; PR 51-90%; her 2=0; [Dr.Sakai]  IMPRESSION: 1. There is a highly suspicious mass in the right breast at 1 o'clock measuring 8 mm.   2. There is an indeterminate right breast mass at 2 o'clock measuring 7 mm.   3.  No evidence of right axillary lymphadenopathy.   Carcinoma of upper-outer quadrant of right breast in female, estrogen receptor positive (HCC)  08/03/2021 Initial Diagnosis   Carcinoma of upper-outer quadrant of right breast in female, estrogen receptor positive (HCC)      HISTORY OF PRESENTING ILLNESS: Alone.  Ambulating independently.  Barbara Contreras 72 y.o.  female patient breast cancer ER/PR positive HER2 negative of positive stage I breast cancer currently status post lumpectomy followed by radiation.  Patient is currently on anastrozole since January 2023.   Feb 2024 caroid artery surgery  In December 2023 patient was referred to the emergency room for acute renal failure/baclofen toxicity.  Also patient had a recent carotid surgery complicated by respiratory atelectasis. Discharged from hospital on home oxygen.   Here for Follow up Breast cancer. Taking anastrozole as ordered. Pt has increased fatigue. Appetite is good. Occ breast discomfort. Nipple sensation has returned.    Review of Systems  Constitutional:  Negative for chills, diaphoresis, fever,  malaise/fatigue and weight loss.  HENT:  Negative for nosebleeds and sore throat.   Eyes:  Negative for double vision.  Respiratory:  Negative for cough, hemoptysis, sputum production, shortness of breath and wheezing.   Cardiovascular:  Negative for chest pain, palpitations, orthopnea and leg swelling.  Gastrointestinal:  Negative for abdominal pain, blood in stool, constipation, heartburn, melena and nausea.  Genitourinary:  Negative for dysuria, frequency and urgency.  Musculoskeletal:  Positive for back pain and joint pain.  Skin: Negative.  Negative for itching and rash.  Neurological:  Negative for tingling, focal weakness and headaches.  Endo/Heme/Allergies:  Does not bruise/bleed easily.  Psychiatric/Behavioral:  Negative for depression. The patient is not nervous/anxious and does not have insomnia.      MEDICAL HISTORY:  Past Medical History:  Diagnosis Date   Arthritis    Asthma    Back pain    Breast cancer (HCC) 2022   Carotid artery stenosis    Carotid stenosis    Coronary artery disease    Depression    Emphysema of lung (HCC)    GERD (gastroesophageal reflux disease)    Heart murmur    Hip pain    History of Lyme disease    HLD (hyperlipidemia)    Hyperlipemia    Hypertension    IBS (irritable bowel syndrome)    IBS (irritable bowel syndrome)    Lyme disease    Personal history of radiation therapy    Pre-diabetes    Sciatica    Sciatica    Seizures (HCC)    Tremor    Vitamin D deficiency    Vitamin D deficiency     SURGICAL HISTORY: Past Surgical History:  Procedure  Laterality Date   ABDOMINAL HYSTERECTOMY     BREAST BIOPSY Right 07/22/2021   [place] bx 1:00 area, venus marker, [facility] tubular features   BREAST BIOPSY Right 07/22/2021   [place] bx 2:00 ribbon marker, fibroadenoma   BREAST LUMPECTOMY Right 08/24/2021   re-excision   BREAST LUMPECTOMY Right 08/12/2021   CAROTID ENDARTERECTOMY Right    CAROTID PTA/STENT INTERVENTION Left 01/25/2023    Procedure: CAROTID PTA/STENT INTERVENTION;  Surgeon: Annice Needy, MD;  Location: [facility] INVASIVE CV LAB;  Service: Cardiovascular;  Laterality: Left;   CHOLECYSTECTOMY     COLONOSCOPY WITH PROPOFOL N/A 09/13/2018   Procedure: COLONOSCOPY WITH PROPOFOL;  Surgeon: Christena Deem, MD;  Location: [facility] ENDOSCOPY;  Service: Endoscopy;  Laterality: N/A;   COLONOSCOPY WITH PROPOFOL N/A 08/17/2023   Procedure: COLONOSCOPY WITH PROPOFOL;  Surgeon: Regis Bill, MD;  Location: [facility] ENDOSCOPY;  Service: Endoscopy;  Laterality: N/A;   ESOPHAGOGASTRODUODENOSCOPY N/A 05/03/2021   Procedure: ESOPHAGOGASTRODUODENOSCOPY (EGD);  Surgeon: Regis Bill, MD;  Location: [facility] ENDOSCOPY;  Service: Endoscopy;  Laterality: N/A;   HEMOSTASIS CLIP PLACEMENT  08/17/2023   Procedure: HEMOSTASIS CLIP PLACEMENT;  Surgeon: Regis Bill, MD;  Location: [facility] ENDOSCOPY;  Service: Endoscopy;;   paranasal sinusotomy     PART MASTECTOMY,RADIO FREQUENCY LOCALIZER,AXILLARY SENTINEL NODE BIOPSY Right 08/12/2021   Procedure: PART MASTECTOMY,RADIO FREQUENCY LOCALIZER,AXILLARY SENTINEL NODE BIOPSY;  Surgeon: Sung Amabile, DO;  Location: [facility] ORS;  Service: General;  Laterality: Right;   POLYPECTOMY  08/17/2023   Procedure: POLYPECTOMY;  Surgeon: Regis Bill, MD;  Location: [facility] ENDOSCOPY;  Service: Endoscopy;;   RE-EXCISION OF BREAST CANCER,SUPERIOR MARGINS Right 08/24/2021   Procedure: RE-EXCISION OF BREAST CANCER,SUPERIOR MARGINS;  Surgeon: Sung Amabile, DO;  Location: [facility] ORS;  Service: General;  Laterality: Right;   SUBMUCOSAL LIFTING INJECTION  08/17/2023   Procedure: SUBMUCOSAL LIFTING INJECTION;  Surgeon: Regis Bill, MD;  Location: [facility] ENDOSCOPY;  Service: Endoscopy;;    SOCIAL HISTORY: Social History   Socioeconomic History   Marital status: Divorced    Spouse name: Not on file   Number of children: Not on file   Years of education: Not on file   Highest education level: Not on file   Occupational History   Not on file  Tobacco Use   Smoking status: Every Day    Current packs/day: 0.50    Average packs/day: 0.5 packs/day for 55.0 years (27.5 ttl pk-yrs)    Types: Cigarettes   Smokeless tobacco: Never  Vaping Use   Vaping status: Never Used  Substance and Sexual Activity   Alcohol use: Yes    Alcohol/week: 0.0 standard drinks of alcohol    Comment: occasional   Drug use: Yes    Types: Marijuana    Comment: none for 1 week   Sexual activity: Not on file  Other Topics Concern   Not on file  Social History Narrative   [place] ~25-30 mins; lives by self; no children/husband; sister near by. Smoker 1ppd/marijuana. Alcohol- beer/liqqor; retd from sales.    Social Determinants of Health   Financial Resource Strain: High Risk (03/29/2023)   Received from [facility], [facility]   Overall Financial Resource Strain (CARDIA)    Difficulty of Paying Living Expenses: Hard  Food Insecurity: Food Insecurity Present (03/29/2023)   Received from [facility], [facility]   Hunger Vital Sign    Worried About Running Out of Food in the Last Year: Never true  Ran Out of Food in the Last Year: Sometimes true  Transportation Needs: No Transportation Needs (03/29/2023)   Received from [facility], [facility]   [facility] - Transportation    In the past 12 months, has lack of transportation kept you from medical appointments or from getting medications?: No    Lack of Transportation (Non-Medical): No  Physical Activity: Not on file  Stress: Not on file  Social Connections: Not on file  Intimate Partner Violence: Not At Risk (11/07/2022)   Humiliation, Afraid, Rape, and Kick questionnaire    Fear of Current or Ex-Partner: No    Emotionally Abused: No    Physically Abused: No    Sexually Abused: No    FAMILY HISTORY: Family History  Problem Relation Age of  Onset   Alcohol abuse Father    Alzheimer's disease Father    Anxiety disorder Father    Depression Father    Alcohol abuse Mother    CAD Mother    Diabetes Mother    Hyperlipidemia Mother    Depression Mother     ALLERGIES:  is allergic to morphine and codeine, clindamycin, erythromycin, other, penicillins, promethazine-phenylephrine, and tape.  MEDICATIONS:  Current Outpatient Medications  Medication Sig Dispense Refill   albuterol (VENTOLIN HFA) 108 (90 Base) MCG/ACT inhaler Inhale 1-2 puffs into the lungs every 6 (six) hours as needed for wheezing or shortness of breath.     amLODipine (NORVASC) 5 MG tablet Take 5 mg by mouth daily.     anastrozole (ARIMIDEX) 1 MG tablet TAKE 1 TABLET BY MOUTH EVERY DAY 90 tablet 0   aspirin EC 81 MG tablet Take 81 mg by mouth daily.     atorvastatin (LIPITOR) 80 MG tablet Take 80 mg by mouth daily.     baclofen (LIORESAL) 10 MG tablet Take 5-10 mg by mouth at bedtime as needed for muscle spasms.     calcium carbonate (TUMS EX) 750 MG chewable tablet Chew 1 tablet by mouth daily as needed for heartburn.     Cholecalciferol (VITAMIN D3) 10 MCG (400 UNIT) tablet Take 400 Units by mouth daily.     clobetasol (TEMOVATE) 0.05 % external solution Apply 1 Application topically daily.     clopidogrel (PLAVIX) 75 MG tablet TAKE 1 TABLET BY MOUTH EVERY DAY 90 tablet 2   dicyclomine (BENTYL) 10 MG capsule Take 10 mg by mouth in the morning and at bedtime.     docusate sodium (COLACE) 100 MG capsule Take 100 mg by mouth 2 (two) times daily.     famotidine (PEPCID) 20 MG tablet Take 20 mg by mouth 2 (two) times daily.     fluticasone (FLONASE) 50 MCG/ACT nasal spray Place 2 sprays into both nostrils as needed.     gabapentin (NEURONTIN) 600 MG tablet Take 600 mg by mouth 3 (three) times daily.  5   ketoconazole (NIZORAL) 2 % shampoo Apply 1 application topically once a week.     lisinopril-hydrochlorothiazide (ZESTORETIC) 20-12.5 MG tablet Take 1 tablet by  mouth in the morning and at bedtime. Hold until you see your doctor     Multiple Vitamin (MULTIVITAMIN WITH MINERALS) TABS tablet Take 1 tablet by mouth daily.     venlafaxine XR (EFFEXOR-XR) 75 MG 24 hr capsule Take 75 mg by mouth 3 (three) times daily.     vitamin B-12 (CYANOCOBALAMIN) 1000 MCG tablet Take 1,000 mcg by mouth daily.     Zinc Sulfate (ZINC 15 PO) Take 15  mg by mouth daily.     No current facility-administered medications for this visit.      Marland Kitchen  PHYSICAL EXAMINATION: ECOG PERFORMANCE STATUS: 0 - Asymptomatic  Vitals:   09/07/23 1418  BP: 103/68  Pulse: 88  Temp: 97.7 F (36.5 C)  SpO2: 94%   Filed Weights   09/07/23 1418  Weight: 158 lb (71.7 kg)    Physical Exam Vitals and nursing note reviewed.  HENT:     Head: Normocephalic and atraumatic.     Mouth/Throat:     Pharynx: Oropharynx is clear.  Eyes:     Extraocular Movements: Extraocular movements intact.     Pupils: Pupils are equal, round, and reactive to light.  Cardiovascular:     Rate and Rhythm: Normal rate and regular rhythm.     Heart sounds: Murmur heard.  Pulmonary:     Comments: Decreased breath sounds bilaterally.  Abdominal:     Palpations: Abdomen is soft.  Musculoskeletal:        General: Normal range of motion.     Cervical back: Normal range of motion.  Skin:    General: Skin is warm.  Neurological:     General: No focal deficit present.     Mental Status: She is alert and oriented to person, place, and time.  Psychiatric:        Behavior: Behavior normal.        Judgment: Judgment normal.      LABORATORY DATA:  I have reviewed the data as listed Lab Results  Component Value Date   WBC 6.1 09/07/2023   HGB 13.2 09/07/2023   HCT 40.7 09/07/2023   MCV 94.0 09/07/2023   PLT 203 09/07/2023   Recent Labs    01/29/23 0502 02/06/23 1420 09/07/23 1419  NA 138 138 138  K 3.7 4.7 4.1  CL 101 101 101  CO2 32 31 29  GLUCOSE 112* 74 140*  BUN 11 19 24*  CREATININE  0.70 1.05* 0.97  CALCIUM 8.7* 9.5 9.1  GFRNONAA >60 56* >60  PROT 5.6* 7.0 7.0  ALBUMIN 3.0* 3.7 3.8  AST 14* 16 19  ALT 12 15 17   ALKPHOS 43 65 66  BILITOT 0.7 0.5 0.6    RADIOGRAPHIC STUDIES: I have personally reviewed the radiological images as listed and agreed with the findings in the report. No results found.  ASSESSMENT & PLAN:   Carcinoma of upper-outer quadrant of right breast in female, estrogen receptor positive (HCC) #Stage I pT1a grade 1- ER/PR positive; her2 NEU-NEGATIVE.  S/p postlumpectomy radiation; 11/28 last RT. Sep 2024- Bil-Mammo-WNL.   # Currently on Anastrazole [since jan 2023 x 5 years]- stable. Continue anastrozole. Tolerating well.   # Hot flashes- G-1 monitor for now.   # OSTEOPOROSIS: [FEB 2022]T-score of -2.2.  Continue calcium plus vitamin D. T-score: of -2.7. Discussed the potential risk factors for osteoporosis- age/gender/postmenopausal status/use of anti-estrogen treatments. Discussed multiple options including exercise/ calcium and vitamin D supplementation/ and also use of bisphosphonates. Discussed oral bisphosphonates [GERD] versus parenteral bisphosphonate like Reclast. Discussed the potential benefits and/side effects  Including but not limited to Osteonecrosis of jaw/ hypocalcemia. Patient interested in Reclast- ordered.   # Acute renal failure- [dec 2023]- resolved. Monitor for now.   # PVD s/p Left CEA-on aspirin plus Plavix [as per vascular]-stable.  # DISPOSITION: # reclast next week.  # follow up in 6  months-  MD; labs- cbc/cmp;- Dr.B  All questions were answered. The patient/family knows to call  the clinic with any problems, questions or concerns.    Earna Coder, MD 09/07/2023 3:26 PM

## 2023-09-07 NOTE — Assessment & Plan Note (Addendum)
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  S/p postlumpectomy radiation; 11/28 last RT. Sep 2024- Bil-Mammo-WNL.   # Currently on Anastrazole [since jan 2023 x 5 years]- stable. Continue anastrozole. Tolerating well.   # Hot flashes- G-1 monitor for now.   # OSTEOPOROSIS: [FEB 2022]T-score of -2.2.  Continue calcium plus vitamin D. T-score: of -2.7. Discussed the potential risk factors for osteoporosis- age/gender/postmenopausal status/use of anti-estrogen treatments. Discussed multiple options including exercise/ calcium and vitamin D supplementation/ and also use of bisphosphonates. Discussed oral bisphosphonates [GERD] versus parenteral bisphosphonate like Reclast. Discussed the potential benefits and/side effects  Including but not limited to Osteonecrosis of jaw/ hypocalcemia. Patient interested in Reclast- ordered.   # Acute renal failure- [dec 2023]- resolved. Monitor for now.   # PVD s/p Left CEA-on aspirin plus Plavix [as per vascular]-stable.  # DISPOSITION: # reclast next week.  # follow up in 6  months-  MD; labs- cbc/cmp;- Dr.B

## 2023-10-08 ENCOUNTER — Ambulatory Visit
Admission: RE | Admit: 2023-10-08 | Discharge: 2023-10-08 | Disposition: A | Discharge status: Home / Self Care | Payer: 59 | Source: Ambulatory Visit | Attending: Radiation Oncology | Admitting: Radiation Oncology

## 2023-10-08 ENCOUNTER — Encounter: Payer: Self-pay | Admitting: Radiation Oncology

## 2023-10-08 VITALS — BP 103/66 | HR 81 | Temp 97.6°F | Resp 20 | Wt 157.9 lb

## 2023-10-08 DIAGNOSIS — Z923 Personal history of irradiation: Secondary | ICD-10-CM | POA: Diagnosis not present

## 2023-10-08 DIAGNOSIS — Z79811 Long term (current) use of aromatase inhibitors: Secondary | ICD-10-CM | POA: Insufficient documentation

## 2023-10-08 DIAGNOSIS — M549 Dorsalgia, unspecified: Secondary | ICD-10-CM | POA: Diagnosis not present

## 2023-10-08 DIAGNOSIS — C50411 Malignant neoplasm of upper-outer quadrant of right female breast: Secondary | ICD-10-CM | POA: Insufficient documentation

## 2023-10-08 DIAGNOSIS — Z170 Estrogen receptor positive status [ER+]: Secondary | ICD-10-CM | POA: Diagnosis not present

## 2023-10-08 NOTE — Progress Notes (Signed)
Radiation Oncology Follow up Note  Name: Barbara Contreras   Date:   10/08/2023 MRN:  [id number] DOB: 12-Jan-1951    This 72 y.o. female presents to the clinic today for 2-year follow-up status post whole breast radiation to right breast for stage Ia (T1a N0 M0) ER/PR positive invasive mammary carcinoma.  REFERRING PROVIDER: Dione Contreras, *  HPI: The patient, a 72 year old with a history of stage 1A ERPR positive invasive mammary carcinoma of the right breast, presents for routine follow-up. She completed whole breast radiation therapy two years ago and has been on Arimidex since, which she tolerates well without side effects. Her most recent mammograms in September were BI-RADS 2 benign.  In addition to her cancer history, the patient reports back pain. She is scheduled to see a back doctor for this issue..  COMPLICATIONS OF TREATMENT: none  FOLLOW UP COMPLIANCE: keeps appointments   PHYSICAL EXAM:  BP 103/66   Pulse 81   Temp 97.6 F (36.4 C)   Resp 20   Wt 157 lb 14.4 oz (71.6 kg)   BMI 25.88 kg/m  Lungs are clear to A&P cardiac examination essentially unremarkable with regular rate and rhythm. No dominant mass or nodularity is noted in either breast in 2 positions examined. Incision is well-healed. No axillary or supraclavicular adenopathy is appreciated. Cosmetic result is excellent.  Well-developed well-nourished patient in NAD. HEENT reveals PERLA, EOMI, discs not visualized.  Oral cavity is clear. No oral mucosal lesions are identified. Neck is clear without evidence of cervical or supraclavicular adenopathy. Lungs are clear to A&P. Cardiac examination is essentially unremarkable with regular rate and rhythm without murmur rub or thrill. Abdomen is benign with no organomegaly or masses noted. Motor sensory and DTR levels are equal and symmetric in the upper and lower extremities. Cranial nerves II through XII are grossly intact. Proprioception is intact. No peripheral  adenopathy or edema is identified. No motor or sensory levels are noted. Crude visual fields are within normal range.B  RADIOLOGY RESULTS: RADIOLOGY Mammogram: BI-RADS 2 benign (08/2023)  PLAN: Stage IA ER/PR positive invasive mammary carcinoma Two years post whole breast radiation. Recent mammograms were BI-RADS 2 benign. Tolerating Arimidex without side effects. -Continue Arimidex. -Transition breast follow-up to Dr. Donneta Romberg. -Remain available for any concerns or issues.  Back pain Patient reports back pain and has an appointment with a back specialist. -Continue follow-up with back specialist as scheduled.    Barbara Miller, MD

## 2023-10-10 ENCOUNTER — Other Ambulatory Visit: Payer: Self-pay | Admitting: Internal Medicine

## 2023-10-24 ENCOUNTER — Other Ambulatory Visit: Payer: Self-pay | Admitting: Internal Medicine

## 2023-11-20 ENCOUNTER — Encounter (INDEPENDENT_AMBULATORY_CARE_PROVIDER_SITE_OTHER): Payer: 59

## 2023-11-20 ENCOUNTER — Ambulatory Visit (INDEPENDENT_AMBULATORY_CARE_PROVIDER_SITE_OTHER): Payer: 59 | Admitting: Nurse Practitioner

## 2024-03-10 ENCOUNTER — Inpatient Hospital Stay: Payer: 59 | Attending: Internal Medicine

## 2024-03-10 ENCOUNTER — Encounter: Payer: Self-pay | Admitting: Internal Medicine

## 2024-03-10 ENCOUNTER — Inpatient Hospital Stay (HOSPITAL_BASED_OUTPATIENT_CLINIC_OR_DEPARTMENT_OTHER): Payer: 59 | Admitting: Internal Medicine

## 2024-03-10 VITALS — BP 98/57 | HR 81 | Temp 97.3°F | Resp 16 | Ht 65.5 in | Wt 156.8 lb

## 2024-03-10 DIAGNOSIS — C50411 Malignant neoplasm of upper-outer quadrant of right female breast: Secondary | ICD-10-CM

## 2024-03-10 DIAGNOSIS — Z79899 Other long term (current) drug therapy: Secondary | ICD-10-CM | POA: Diagnosis not present

## 2024-03-10 DIAGNOSIS — Z170 Estrogen receptor positive status [ER+]: Secondary | ICD-10-CM | POA: Diagnosis not present

## 2024-03-10 DIAGNOSIS — Z79811 Long term (current) use of aromatase inhibitors: Secondary | ICD-10-CM | POA: Diagnosis not present

## 2024-03-10 DIAGNOSIS — F17210 Nicotine dependence, cigarettes, uncomplicated: Secondary | ICD-10-CM | POA: Diagnosis not present

## 2024-03-10 DIAGNOSIS — Z7982 Long term (current) use of aspirin: Secondary | ICD-10-CM | POA: Insufficient documentation

## 2024-03-10 DIAGNOSIS — Z1721 Progesterone receptor positive status: Secondary | ICD-10-CM | POA: Diagnosis not present

## 2024-03-10 DIAGNOSIS — Z1732 Human epidermal growth factor receptor 2 negative status: Secondary | ICD-10-CM | POA: Diagnosis not present

## 2024-03-10 LAB — CMP (CANCER CENTER ONLY)
ALT: 22 U/L (ref 0–44)
AST: 17 U/L (ref 15–41)
Albumin: 3.5 g/dL (ref 3.5–5.0)
Alkaline Phosphatase: 55 U/L (ref 38–126)
Anion gap: 4 — ABNORMAL LOW (ref 5–15)
BUN: 25 mg/dL — ABNORMAL HIGH (ref 8–23)
CO2: 27 mmol/L (ref 22–32)
Calcium: 8.5 mg/dL — ABNORMAL LOW (ref 8.9–10.3)
Chloride: 105 mmol/L (ref 98–111)
Creatinine: 1.35 mg/dL — ABNORMAL HIGH (ref 0.44–1.00)
GFR, Estimated: 41 mL/min — ABNORMAL LOW (ref >60)
Glucose, Bld: 144 mg/dL — ABNORMAL HIGH (ref 70–99)
Potassium: 4.1 mmol/L (ref 3.5–5.1)
Sodium: 136 mmol/L (ref 135–145)
Total Bilirubin: 0.5 mg/dL (ref 0.0–1.2)
Total Protein: 6.4 g/dL — ABNORMAL LOW (ref 6.5–8.1)

## 2024-03-10 LAB — CBC WITH DIFFERENTIAL (CANCER CENTER ONLY)
Abs Immature Granulocytes: 0.05 10*3/uL (ref 0.00–0.07)
Basophils Absolute: 0.1 10*3/uL (ref 0.0–0.1)
Basophils Relative: 1 %
Eosinophils Absolute: 0.1 10*3/uL (ref 0.0–0.5)
Eosinophils Relative: 1 %
HCT: 37.6 % (ref 36.0–46.0)
Hemoglobin: 11.2 g/dL — ABNORMAL LOW (ref 12.0–15.0)
Immature Granulocytes: 1 %
Lymphocytes Relative: 17 %
Lymphs Abs: 1.5 10*3/uL (ref 0.7–4.0)
MCH: 27.1 pg (ref 26.0–34.0)
MCHC: 29.8 g/dL — ABNORMAL LOW (ref 30.0–36.0)
MCV: 91 fL (ref 80.0–100.0)
Monocytes Absolute: 0.6 10*3/uL (ref 0.1–1.0)
Monocytes Relative: 6 %
Neutro Abs: 6.8 10*3/uL (ref 1.7–7.7)
Neutrophils Relative %: 74 %
Platelet Count: 255 10*3/uL (ref 150–400)
RBC: 4.13 MIL/uL (ref 3.87–5.11)
RDW: 15.5 % (ref 11.5–15.5)
WBC Count: 9 10*3/uL (ref 4.0–10.5)
nRBC: 0 % (ref 0.0–0.2)

## 2024-03-10 MED ORDER — ANASTROZOLE 1 MG PO TABS: 1.0000 mg | ORAL_TABLET | Freq: Every day | ORAL | 2 refills | Status: DC

## 2024-03-10 NOTE — Patient Instructions (Signed)
#   HOLD lisinopril-hydrochlorothiazide;   # continue amlodpine  # check BP at home- and call PCP for further instructions.

## 2024-03-10 NOTE — Progress Notes (Signed)
 She states she fell 2 months ago, hit her tail bone on the corner of a desk. Xray, no breaks. She has been having dizziness a lot. She thinks her BP may be dropping.

## 2024-03-10 NOTE — Assessment & Plan Note (Addendum)
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  S/p postlumpectomy radiation; 11/28 last RT. Sep 2024- Bil-Mammo-WNL.   # Currently on Anastrazole [since jan 2023 x 5 years]- stable. Continue anastrozole. Tolerating well.   # Dizzy spells- systolic BP- 90s. Recommend HOLDING lisinopril-hydrochlorothiazide; and continue amlodpine.  Recommend checking blood pressure; and reaching out to PCP.   # Hot flashes- G-1 monitor for now.   # OSTEOPOROSIS: [FEB 2022]T-score of -2.2.  Continue calcium plus vitamin D. T-score: of -2.7.  Patient interested in Reclast- next visit,   # Acute renal failure- [dec 2023]- resolved. Monitor for now.   # PVD s/p Left CEA-on aspirin plus Plavix [as per vascular]-stable.  # DISPOSITION: # follow up in 6  months-  MD; labs- cbc/cmp; Possible reclast-- Dr.B

## 2024-03-10 NOTE — Progress Notes (Signed)
 [facility] CONSULT NOTE  Patient Care Team: Zada Finders Joycie Peek, MD as PCP - General (Family Medicine) Jim Like, RN as Oncology Nurse Navigator Earna Coder, MD as Consulting Physician (Oncology) [name], MD as Consulting Physician (Radiation Oncology) Mertie Moores, MD as Referring Physician (Pulmonary Disease) Sung Amabile, DO as Consulting Physician (Surgery) Lamar Blinks, MD as Consulting Physician (Cardiology)  CHIEF COMPLAINTS/PURPOSE OF CONSULTATION: Breast cancer  #  Oncology History Overview Note  # AUG 2022[facility] -Tubular; G-1 ER > 90%; PR 51-90%; her 2=0; [Dr.Sakai]  IMPRESSION: 1. There is a highly suspicious mass in the right breast at 1 o'clock measuring 8 mm.   2. There is an indeterminate right breast mass at 2 o'clock measuring 7 mm.   3.  No evidence of right axillary lymphadenopathy.   Carcinoma of upper-outer quadrant of right breast in female, estrogen receptor positive (HCC)  08/03/2021 Initial Diagnosis   Carcinoma of upper-outer quadrant of right breast in female, estrogen receptor positive (HCC)      HISTORY OF PRESENTING ILLNESS: Alone.  Ambulating independently.  Barbara Contreras 73 y.o.  female patient breast cancer ER/PR positive HER2 negative of positive stage I breast cancer currently status post lumpectomy followed by radiation.  Patient is currently on anastrozole since January 2023.  She states she tripped and fell 2 months ago, hit her tail bone on the corner of a desk. Xray, no breaks.   Patient s/p She has been having dizziness a lot. She thinks her BP may be dropping. No changes in medications. Not checked blood pressure at home recently.      Appetite is good. Occ breast discomfort.  Review of Systems  Constitutional:  Negative for chills, diaphoresis, fever, malaise/fatigue and weight loss.  HENT:  Negative for nosebleeds and sore throat.   Eyes:  Negative for double vision.   Respiratory:  Negative for cough, hemoptysis, sputum production, shortness of breath and wheezing.   Cardiovascular:  Negative for chest pain, palpitations, orthopnea and leg swelling.  Gastrointestinal:  Negative for abdominal pain, blood in stool, constipation, heartburn, melena and nausea.  Genitourinary:  Negative for dysuria, frequency and urgency.  Musculoskeletal:  Positive for back pain and joint pain.  Skin: Negative.  Negative for itching and rash.  Neurological:  Positive for dizziness. Negative for tingling, focal weakness and headaches.  Endo/Heme/Allergies:  Does not bruise/bleed easily.  Psychiatric/Behavioral:  Negative for depression. The patient is not nervous/anxious and does not have insomnia.      MEDICAL HISTORY:  Past Medical History:  Diagnosis Date   Arthritis    Asthma    Back pain    Breast cancer (HCC) 2022   Carotid artery stenosis    Carotid stenosis    Coronary artery disease    Depression    Emphysema of lung (HCC)    GERD (gastroesophageal reflux disease)    Heart murmur    Hip pain    History of Lyme disease    HLD (hyperlipidemia)    Hyperlipemia    Hypertension    IBS (irritable bowel syndrome)    IBS (irritable bowel syndrome)    Lyme disease    Personal history of radiation therapy    Pre-diabetes    Sciatica    Sciatica    Seizures (HCC)    Tremor    Vitamin D deficiency    Vitamin D deficiency     SURGICAL HISTORY: Past Surgical History:  Procedure Laterality Date  ABDOMINAL HYSTERECTOMY     BREAST BIOPSY Right 07/22/2021   [place] bx 1:00 area, venus marker, [facility] tubular features   BREAST BIOPSY Right 07/22/2021   [place] bx 2:00 ribbon marker, fibroadenoma   BREAST LUMPECTOMY Right 08/24/2021   re-excision   BREAST LUMPECTOMY Right 08/12/2021   CAROTID ENDARTERECTOMY Right    CAROTID PTA/STENT INTERVENTION Left 01/25/2023   Procedure: CAROTID PTA/STENT INTERVENTION;  Surgeon: Annice Needy, MD;  Location: [facility] INVASIVE CV LAB;   Service: Cardiovascular;  Laterality: Left;   CHOLECYSTECTOMY     COLONOSCOPY WITH PROPOFOL N/A 09/13/2018   Procedure: COLONOSCOPY WITH PROPOFOL;  Surgeon: Christena Deem, MD;  Location: [facility] ENDOSCOPY;  Service: Endoscopy;  Laterality: N/A;   COLONOSCOPY WITH PROPOFOL N/A 08/17/2023   Procedure: COLONOSCOPY WITH PROPOFOL;  Surgeon: Regis Bill, MD;  Location: [facility] ENDOSCOPY;  Service: Endoscopy;  Laterality: N/A;   ESOPHAGOGASTRODUODENOSCOPY N/A 05/03/2021   Procedure: ESOPHAGOGASTRODUODENOSCOPY (EGD);  Surgeon: Regis Bill, MD;  Location: [facility] ENDOSCOPY;  Service: Endoscopy;  Laterality: N/A;   HEMOSTASIS CLIP PLACEMENT  08/17/2023   Procedure: HEMOSTASIS CLIP PLACEMENT;  Surgeon: Regis Bill, MD;  Location: [facility] ENDOSCOPY;  Service: Endoscopy;;   paranasal sinusotomy     PART MASTECTOMY,RADIO FREQUENCY LOCALIZER,AXILLARY SENTINEL NODE BIOPSY Right 08/12/2021   Procedure: PART MASTECTOMY,RADIO FREQUENCY LOCALIZER,AXILLARY SENTINEL NODE BIOPSY;  Surgeon: Sung Amabile, DO;  Location: [facility] ORS;  Service: General;  Laterality: Right;   POLYPECTOMY  08/17/2023   Procedure: POLYPECTOMY;  Surgeon: Regis Bill, MD;  Location: [facility] ENDOSCOPY;  Service: Endoscopy;;   RE-EXCISION OF BREAST CANCER,SUPERIOR MARGINS Right 08/24/2021   Procedure: RE-EXCISION OF BREAST CANCER,SUPERIOR MARGINS;  Surgeon: Sung Amabile, DO;  Location: [facility] ORS;  Service: General;  Laterality: Right;   SUBMUCOSAL LIFTING INJECTION  08/17/2023   Procedure: SUBMUCOSAL LIFTING INJECTION;  Surgeon: Regis Bill, MD;  Location: [facility] ENDOSCOPY;  Service: Endoscopy;;    SOCIAL HISTORY: Social History   Socioeconomic History   Marital status: Divorced    Spouse name: Not on file   Number of children: Not on file   Years of education: Not on file   Highest education level: Not on file  Occupational History   Not on file  Tobacco Use   Smoking status: Every Day    Current packs/day: 0.50     Average packs/day: 0.5 packs/day for 55.0 years (27.5 ttl pk-yrs)    Types: Cigarettes   Smokeless tobacco: Never  Vaping Use   Vaping status: Never Used  Substance and Sexual Activity   Alcohol use: Yes    Alcohol/week: 0.0 standard drinks of alcohol    Comment: occasional   Drug use: Yes    Types: Marijuana    Comment: none for 1 week   Sexual activity: Not on file  Other Topics Concern   Not on file  Social History Narrative   [place] ~25-30 mins; lives by self; no children/husband; sister near by. Smoker 1ppd/marijuana. Alcohol- beer/liqqor; retd from sales.    Social Drivers of Corporate investment banker Strain: High Risk (03/29/2023)   Received from [facility], [facility]   Overall Financial Resource Strain (CARDIA)    Difficulty of Paying Living Expenses: Hard  Food Insecurity: Food Insecurity Present (03/29/2023)   Received from [facility], [facility]   Hunger Vital Sign    Worried About Running Out of Food in the Last Year: Never true    Ran Out of  Food in the Last Year: Sometimes true  Transportation Needs: No Transportation Needs (03/29/2023)   Received from [facility], [facility]   [facility] - Transportation    In the past 12 months, has lack of transportation kept you from medical appointments or from getting medications?: No    Lack of Transportation (Non-Medical): No  Physical Activity: Not on file  Stress: Not on file  Social Connections: Not on file  Intimate Partner Violence: Not At Risk (11/07/2022)   Humiliation, Afraid, Rape, and Kick questionnaire    Fear of Current or Ex-Partner: No    Emotionally Abused: No    Physically Abused: No    Sexually Abused: No    FAMILY HISTORY: Family History  Problem Relation Age of Onset   Alcohol abuse Father    Alzheimer's disease Father    Anxiety disorder Father    Depression Father     Alcohol abuse Mother    CAD Mother    Diabetes Mother    Hyperlipidemia Mother    Depression Mother     ALLERGIES:  is allergic to morphine and codeine, clindamycin, erythromycin, other, penicillins, promethazine-phenylephrine, and tape.  MEDICATIONS:  Current Outpatient Medications  Medication Sig Dispense Refill   albuterol (VENTOLIN HFA) 108 (90 Base) MCG/ACT inhaler Inhale 1-2 puffs into the lungs every 6 (six) hours as needed for wheezing or shortness of breath.     amLODipine (NORVASC) 5 MG tablet Take 5 mg by mouth daily.     aspirin EC 81 MG tablet Take 81 mg by mouth daily.     atorvastatin (LIPITOR) 80 MG tablet Take 80 mg by mouth daily.     baclofen (LIORESAL) 10 MG tablet Take 5-10 mg by mouth at bedtime as needed for muscle spasms.     calcium carbonate (TUMS EX) 750 MG chewable tablet Chew 1 tablet by mouth daily as needed for heartburn.     Cholecalciferol (VITAMIN D3) 10 MCG (400 UNIT) tablet Take 400 Units by mouth daily.     clobetasol (TEMOVATE) 0.05 % external solution Apply 1 Application topically daily.     clopidogrel (PLAVIX) 75 MG tablet TAKE 1 TABLET BY MOUTH EVERY DAY 90 tablet 2   dicyclomine (BENTYL) 10 MG capsule Take 10 mg by mouth in the morning and at bedtime.     docusate sodium (COLACE) 100 MG capsule Take 100 mg by mouth 2 (two) times daily.     famotidine (PEPCID) 20 MG tablet Take 20 mg by mouth 2 (two) times daily.     fluticasone (FLONASE) 50 MCG/ACT nasal spray Place 2 sprays into both nostrils as needed.     gabapentin (NEURONTIN) 600 MG tablet Take 600 mg by mouth 3 (three) times daily.  5   ketoconazole (NIZORAL) 2 % shampoo Apply 1 application topically once a week.     lisinopril-hydrochlorothiazide (ZESTORETIC) 20-12.5 MG tablet Take 1 tablet by mouth in the morning and at bedtime. Hold until you see your doctor     Multiple Vitamin (MULTIVITAMIN WITH MINERALS) TABS tablet Take 1 tablet by mouth daily.     venlafaxine XR (EFFEXOR-XR) 75 MG  24 hr capsule Take 75 mg by mouth 3 (three) times daily.     vitamin B-12 (CYANOCOBALAMIN) 1000 MCG tablet Take 1,000 mcg by mouth daily.     Zinc Sulfate (ZINC 15 PO) Take 15 mg by mouth daily.     anastrozole (ARIMIDEX) 1 MG tablet Take 1 tablet (1 mg total) by  mouth daily. 90 tablet 2   No current facility-administered medications for this visit.      Marland Kitchen  PHYSICAL EXAMINATION: ECOG PERFORMANCE STATUS: 0 - Asymptomatic  Vitals:   03/10/24 1538  BP: (!) 98/57  Pulse: 81  Resp: 16  Temp: (!) 97.3 F (36.3 C)  SpO2: 98%   Filed Weights   03/10/24 1538  Weight: 156 lb 12.8 oz (71.1 kg)    Physical Exam Vitals and nursing note reviewed.  HENT:     Head: Normocephalic and atraumatic.     Mouth/Throat:     Pharynx: Oropharynx is clear.  Eyes:     Extraocular Movements: Extraocular movements intact.     Pupils: Pupils are equal, round, and reactive to light.  Cardiovascular:     Rate and Rhythm: Normal rate and regular rhythm.     Heart sounds: Murmur heard.  Pulmonary:     Comments: Decreased breath sounds bilaterally.  Abdominal:     Palpations: Abdomen is soft.  Musculoskeletal:        General: Normal range of motion.     Cervical back: Normal range of motion.  Skin:    General: Skin is warm.  Neurological:     General: No focal deficit present.     Mental Status: She is alert and oriented to person, place, and time.  Psychiatric:        Behavior: Behavior normal.        Judgment: Judgment normal.      LABORATORY DATA:  I have reviewed the data as listed Lab Results  Component Value Date   WBC 9.0 03/10/2024   HGB 11.2 (L) 03/10/2024   HCT 37.6 03/10/2024   MCV 91.0 03/10/2024   PLT 255 03/10/2024   Recent Labs    09/07/23 1419 03/10/24 1529  NA 138 136  K 4.1 4.1  CL 101 105  CO2 29 27  GLUCOSE 140* 144*  BUN 24* 25*  CREATININE 0.97 1.35*  CALCIUM 9.1 8.5*  GFRNONAA >60 41*  PROT 7.0 6.4*  ALBUMIN 3.8 3.5  AST 19 17  ALT 17 22   ALKPHOS 66 55  BILITOT 0.6 0.5    RADIOGRAPHIC STUDIES: I have personally reviewed the radiological images as listed and agreed with the findings in the report. No results found.  ASSESSMENT & PLAN:   Carcinoma of upper-outer quadrant of right breast in female, estrogen receptor positive (HCC) #Stage I pT1a grade 1- ER/PR positive; her2 NEU-NEGATIVE.  S/p postlumpectomy radiation; 11/28 last RT. Sep 2024- Bil-Mammo-WNL.   # Currently on Anastrazole [since jan 2023 x 5 years]- stable. Continue anastrozole. Tolerating well.   # Dizzy spells- systolic BP- 90s. Recommend HOLDING lisinopril-hydrochlorothiazide; and continue amlodpine.  Recommend checking blood pressure; and reaching out to PCP.   # Hot flashes- G-1 monitor for now.   # OSTEOPOROSIS: [FEB 2022]T-score of -2.2.  Continue calcium plus vitamin D. T-score: of -2.7.  Patient interested in Reclast- next visit,   # Acute renal failure- [dec 2023]- resolved. Monitor for now.   # PVD s/p Left CEA-on aspirin plus Plavix [as per vascular]-stable.  # DISPOSITION: # follow up in 6  months-  MD; labs- cbc/cmp; Possible reclast-- Dr.B   All questions were answered. The patient/family knows to call the clinic with any problems, questions or concerns.    Earna Coder, MD 03/10/2024 4:20 PM

## 2024-03-11 ENCOUNTER — Other Ambulatory Visit: Payer: Self-pay | Admitting: Nurse Practitioner

## 2024-03-11 DIAGNOSIS — F17200 Nicotine dependence, unspecified, uncomplicated: Secondary | ICD-10-CM

## 2024-03-11 DIAGNOSIS — I119 Hypertensive heart disease without heart failure: Secondary | ICD-10-CM

## 2024-03-11 DIAGNOSIS — I739 Peripheral vascular disease, unspecified: Secondary | ICD-10-CM

## 2024-03-11 DIAGNOSIS — E782 Mixed hyperlipidemia: Secondary | ICD-10-CM

## 2024-03-11 DIAGNOSIS — R079 Chest pain, unspecified: Secondary | ICD-10-CM

## 2024-03-11 DIAGNOSIS — I10 Essential (primary) hypertension: Secondary | ICD-10-CM

## 2024-03-21 ENCOUNTER — Encounter
Admission: RE | Admit: 2024-03-21 | Discharge: 2024-03-21 | Disposition: A | Discharge status: Home / Self Care | Source: Ambulatory Visit | Attending: Nurse Practitioner | Admitting: Nurse Practitioner

## 2024-03-21 DIAGNOSIS — I739 Peripheral vascular disease, unspecified: Secondary | ICD-10-CM

## 2024-03-21 DIAGNOSIS — I10 Essential (primary) hypertension: Secondary | ICD-10-CM | POA: Insufficient documentation

## 2024-03-21 DIAGNOSIS — F17200 Nicotine dependence, unspecified, uncomplicated: Secondary | ICD-10-CM | POA: Insufficient documentation

## 2024-03-21 DIAGNOSIS — E782 Mixed hyperlipidemia: Secondary | ICD-10-CM | POA: Insufficient documentation

## 2024-03-21 DIAGNOSIS — I119 Hypertensive heart disease without heart failure: Secondary | ICD-10-CM | POA: Diagnosis not present

## 2024-03-21 DIAGNOSIS — R079 Chest pain, unspecified: Secondary | ICD-10-CM

## 2024-03-21 MED ORDER — REGADENOSON 0.4 MG/5ML IV SOLN
0.4000 mg | Freq: Once | INTRAVENOUS | Status: AC
  Administered 2024-03-21: 0.4 mg via INTRAVENOUS

## 2024-03-21 MED ORDER — TECHNETIUM TC 99M TETROFOSMIN IV KIT
31.8100 | PACK | Freq: Once | INTRAVENOUS | Status: AC | PRN
  Administered 2024-03-21: 31.81 via INTRAVENOUS

## 2024-03-21 MED ORDER — TECHNETIUM TC 99M TETROFOSMIN IV KIT
10.0000 | PACK | Freq: Once | INTRAVENOUS | Status: AC | PRN
  Administered 2024-03-21: 10.19 via INTRAVENOUS

## 2024-04-08 DIAGNOSIS — I2510 Atherosclerotic heart disease of native coronary artery without angina pectoris: Secondary | ICD-10-CM | POA: Insufficient documentation

## 2024-04-09 LAB — NM MYOCAR MULTI W/SPECT W/WALL MOTION / EF
LV dias vol: 71 mL (ref 46–106)
LV sys vol: 34 mL
Nuc Stress EF: 52 %
Rest Nuclear Isotope Dose: 10.2 mCi
SDS: 0
SRS: 19
SSS: 7
Stress Nuclear Isotope Dose: 31.8 mCi
TID: 1.22

## 2024-04-11 ENCOUNTER — Other Ambulatory Visit: Payer: Self-pay | Admitting: Nurse Practitioner

## 2024-04-11 DIAGNOSIS — I119 Hypertensive heart disease without heart failure: Secondary | ICD-10-CM

## 2024-04-11 DIAGNOSIS — E782 Mixed hyperlipidemia: Secondary | ICD-10-CM

## 2024-04-11 DIAGNOSIS — R079 Chest pain, unspecified: Secondary | ICD-10-CM

## 2024-04-11 DIAGNOSIS — F17200 Nicotine dependence, unspecified, uncomplicated: Secondary | ICD-10-CM

## 2024-04-11 DIAGNOSIS — I10 Essential (primary) hypertension: Secondary | ICD-10-CM

## 2024-04-11 DIAGNOSIS — I739 Peripheral vascular disease, unspecified: Secondary | ICD-10-CM

## 2024-04-14 ENCOUNTER — Other Ambulatory Visit (INDEPENDENT_AMBULATORY_CARE_PROVIDER_SITE_OTHER): Payer: Self-pay | Admitting: Vascular Surgery

## 2024-04-14 NOTE — Telephone Encounter (Signed)
 Patient will need to contact the office to scheduled appointment

## 2024-04-22 ENCOUNTER — Encounter (INDEPENDENT_AMBULATORY_CARE_PROVIDER_SITE_OTHER): Payer: Self-pay

## 2024-04-27 ENCOUNTER — Emergency Department

## 2024-04-27 ENCOUNTER — Observation Stay
Admission: EM | Admit: 2024-04-27 | Discharge: 2024-04-30 | Disposition: A | Discharge status: Home / Self Care | Attending: Student | Admitting: Student

## 2024-04-27 ENCOUNTER — Other Ambulatory Visit: Payer: Self-pay

## 2024-04-27 DIAGNOSIS — J9601 Acute respiratory failure with hypoxia: Secondary | ICD-10-CM | POA: Insufficient documentation

## 2024-04-27 DIAGNOSIS — I130 Hypertensive heart and chronic kidney disease with heart failure and stage 1 through stage 4 chronic kidney disease, or unspecified chronic kidney disease: Secondary | ICD-10-CM | POA: Diagnosis not present

## 2024-04-27 DIAGNOSIS — R7989 Other specified abnormal findings of blood chemistry: Secondary | ICD-10-CM | POA: Diagnosis not present

## 2024-04-27 DIAGNOSIS — I5033 Acute on chronic diastolic (congestive) heart failure: Secondary | ICD-10-CM | POA: Diagnosis not present

## 2024-04-27 DIAGNOSIS — K5900 Constipation, unspecified: Secondary | ICD-10-CM | POA: Diagnosis not present

## 2024-04-27 DIAGNOSIS — I739 Peripheral vascular disease, unspecified: Secondary | ICD-10-CM | POA: Insufficient documentation

## 2024-04-27 DIAGNOSIS — Z7982 Long term (current) use of aspirin: Secondary | ICD-10-CM | POA: Diagnosis not present

## 2024-04-27 DIAGNOSIS — D509 Iron deficiency anemia, unspecified: Secondary | ICD-10-CM | POA: Diagnosis not present

## 2024-04-27 DIAGNOSIS — Z7901 Long term (current) use of anticoagulants: Secondary | ICD-10-CM | POA: Insufficient documentation

## 2024-04-27 DIAGNOSIS — J45909 Unspecified asthma, uncomplicated: Secondary | ICD-10-CM | POA: Insufficient documentation

## 2024-04-27 DIAGNOSIS — Z79899 Other long term (current) drug therapy: Secondary | ICD-10-CM | POA: Diagnosis not present

## 2024-04-27 DIAGNOSIS — R0602 Shortness of breath: Secondary | ICD-10-CM | POA: Diagnosis present

## 2024-04-27 DIAGNOSIS — I480 Paroxysmal atrial fibrillation: Secondary | ICD-10-CM | POA: Diagnosis not present

## 2024-04-27 DIAGNOSIS — D519 Vitamin B12 deficiency anemia, unspecified: Secondary | ICD-10-CM | POA: Insufficient documentation

## 2024-04-27 DIAGNOSIS — I2510 Atherosclerotic heart disease of native coronary artery without angina pectoris: Secondary | ICD-10-CM | POA: Diagnosis not present

## 2024-04-27 DIAGNOSIS — J441 Chronic obstructive pulmonary disease with (acute) exacerbation: Secondary | ICD-10-CM | POA: Diagnosis not present

## 2024-04-27 DIAGNOSIS — G629 Polyneuropathy, unspecified: Secondary | ICD-10-CM | POA: Diagnosis not present

## 2024-04-27 DIAGNOSIS — J449 Chronic obstructive pulmonary disease, unspecified: Secondary | ICD-10-CM | POA: Diagnosis present

## 2024-04-27 DIAGNOSIS — N179 Acute kidney failure, unspecified: Secondary | ICD-10-CM | POA: Insufficient documentation

## 2024-04-27 DIAGNOSIS — Z9862 Peripheral vascular angioplasty status: Secondary | ICD-10-CM | POA: Diagnosis not present

## 2024-04-27 DIAGNOSIS — N182 Chronic kidney disease, stage 2 (mild): Secondary | ICD-10-CM | POA: Diagnosis not present

## 2024-04-27 DIAGNOSIS — Z853 Personal history of malignant neoplasm of breast: Secondary | ICD-10-CM | POA: Insufficient documentation

## 2024-04-27 DIAGNOSIS — D649 Anemia, unspecified: Secondary | ICD-10-CM

## 2024-04-27 LAB — CBC
HCT: 25.8 % — ABNORMAL LOW (ref 36.0–46.0)
HCT: 29.9 % — ABNORMAL LOW (ref 36.0–46.0)
Hemoglobin: 7.4 g/dL — ABNORMAL LOW (ref 12.0–15.0)
Hemoglobin: 9 g/dL — ABNORMAL LOW (ref 12.0–15.0)
MCH: 24.3 pg — ABNORMAL LOW (ref 26.0–34.0)
MCH: 25.5 pg — ABNORMAL LOW (ref 26.0–34.0)
MCHC: 28.7 g/dL — ABNORMAL LOW (ref 30.0–36.0)
MCHC: 30.1 g/dL (ref 30.0–36.0)
MCV: 84.9 fL (ref 80.0–100.0)
MCV: 84.7 fL (ref 80.0–100.0)
Platelets: 236 10*3/uL (ref 150–400)
Platelets: 235 10*3/uL (ref 150–400)
RBC: 3.04 MIL/uL — ABNORMAL LOW (ref 3.87–5.11)
RBC: 3.53 MIL/uL — ABNORMAL LOW (ref 3.87–5.11)
RDW: 17.1 % — ABNORMAL HIGH (ref 11.5–15.5)
RDW: 16.6 % — ABNORMAL HIGH (ref 11.5–15.5)
WBC: 8.8 10*3/uL (ref 4.0–10.5)
WBC: 9.5 10*3/uL (ref 4.0–10.5)
nRBC: 0.2 % (ref 0.0–0.2)
nRBC: 0.2 % (ref 0.0–0.2)

## 2024-04-27 LAB — RETICULOCYTES
Immature Retic Fract: 12.4 % (ref 2.3–15.9)
RBC.: 2.98 MIL/uL — ABNORMAL LOW (ref 3.87–5.11)
Retic Count, Absolute: 92.5 10*3/uL (ref 19.0–186.0)
Retic Ct Pct: 3.6 % — ABNORMAL HIGH (ref 0.4–3.1)

## 2024-04-27 LAB — BASIC METABOLIC PANEL WITH GFR
Anion gap: 10 (ref 5–15)
BUN: 24 mg/dL — ABNORMAL HIGH (ref 8–23)
CO2: 26 mmol/L (ref 22–32)
Calcium: 8.4 mg/dL — ABNORMAL LOW (ref 8.9–10.3)
Chloride: 105 mmol/L (ref 98–111)
Creatinine, Ser: 1.83 mg/dL — ABNORMAL HIGH (ref 0.44–1.00)
GFR, Estimated: 29 mL/min — ABNORMAL LOW (ref >60)
Glucose, Bld: 126 mg/dL — ABNORMAL HIGH (ref 70–99)
Potassium: 4.3 mmol/L (ref 3.5–5.1)
Sodium: 141 mmol/L (ref 135–145)

## 2024-04-27 LAB — PREPARE RBC (CROSSMATCH)

## 2024-04-27 LAB — FERRITIN: Ferritin: 6 ng/mL — ABNORMAL LOW (ref 11–307)

## 2024-04-27 LAB — BRAIN NATRIURETIC PEPTIDE: B Natriuretic Peptide: 901.6 pg/mL — ABNORMAL HIGH (ref 0.0–100.0)

## 2024-04-27 LAB — RESP PANEL BY RT-PCR (RSV, FLU A&B, COVID)  RVPGX2
Influenza A by PCR: NEGATIVE
Influenza B by PCR: NEGATIVE
Resp Syncytial Virus by PCR: NEGATIVE
SARS Coronavirus 2 by RT PCR: NEGATIVE

## 2024-04-27 LAB — PROTIME-INR
INR: 1.5 — ABNORMAL HIGH (ref 0.8–1.2)
Prothrombin Time: 17.8 s — ABNORMAL HIGH (ref 11.4–15.2)

## 2024-04-27 LAB — IRON AND TIBC
Iron: 19 ug/dL — ABNORMAL LOW (ref 28–170)
Saturation Ratios: 4 % — ABNORMAL LOW (ref 10.4–31.8)
TIBC: 538 ug/dL — ABNORMAL HIGH (ref 250–450)
UIBC: 519 ug/dL

## 2024-04-27 LAB — VITAMIN B12: Vitamin B-12: 192 pg/mL (ref 180–914)

## 2024-04-27 LAB — FOLATE: Folate: 20.3 ng/mL (ref >5.9)

## 2024-04-27 LAB — TROPONIN I (HIGH SENSITIVITY)
Troponin I (High Sensitivity): 28 ng/L — ABNORMAL HIGH (ref <18)
Troponin I (High Sensitivity): 85 ng/L — ABNORMAL HIGH (ref <18)
Troponin I (High Sensitivity): 111 ng/L (ref <18)

## 2024-04-27 LAB — ABO/RH: ABO/RH(D): O POS

## 2024-04-27 MED ORDER — IPRATROPIUM-ALBUTEROL 0.5-2.5 (3) MG/3ML IN SOLN: 3.0000 mL | Freq: Three times a day (TID) | RESPIRATORY_TRACT | Status: DC

## 2024-04-27 MED ORDER — IPRATROPIUM-ALBUTEROL 0.5-2.5 (3) MG/3ML IN SOLN
3.0000 mL | Freq: Once | RESPIRATORY_TRACT | Status: AC
  Administered 2024-04-27: 3 mL via RESPIRATORY_TRACT
  Filled 2024-04-27: qty 3

## 2024-04-27 MED ORDER — DOCUSATE SODIUM 100 MG PO CAPS
100.0000 mg | ORAL_CAPSULE | Freq: Two times a day (BID) | ORAL | Status: DC
  Administered 2024-04-27 – 2024-04-30 (×7): 100 mg via ORAL
  Filled 2024-04-27 (×8): qty 1

## 2024-04-27 MED ORDER — GABAPENTIN 300 MG PO CAPS
300.0000 mg | ORAL_CAPSULE | Freq: Three times a day (TID) | ORAL | Status: DC
  Administered 2024-04-27 – 2024-04-30 (×10): 300 mg via ORAL
  Filled 2024-04-27 (×10): qty 1

## 2024-04-27 MED ORDER — METHYLPREDNISOLONE SODIUM SUCC 40 MG IJ SOLR: 40.0000 mg | Freq: Two times a day (BID) | INTRAMUSCULAR | Status: DC

## 2024-04-27 MED ORDER — PANTOPRAZOLE SODIUM 40 MG PO TBEC
40.0000 mg | DELAYED_RELEASE_TABLET | Freq: Every day | ORAL | Status: DC
  Administered 2024-04-27 – 2024-04-30 (×4): 40 mg via ORAL
  Filled 2024-04-27 (×4): qty 1

## 2024-04-27 MED ORDER — ALBUTEROL SULFATE (2.5 MG/3ML) 0.083% IN NEBU
2.5000 mg | INHALATION_SOLUTION | RESPIRATORY_TRACT | Status: DC | PRN
  Administered 2024-04-27 – 2024-04-28 (×2): 2.5 mg via RESPIRATORY_TRACT
  Filled 2024-04-27 (×2): qty 3

## 2024-04-27 MED ORDER — METOPROLOL SUCCINATE ER 25 MG PO TB24
25.0000 mg | ORAL_TABLET | Freq: Every day | ORAL | Status: DC
  Administered 2024-04-27 – 2024-04-30 (×4): 25 mg via ORAL
  Filled 2024-04-27 (×4): qty 1

## 2024-04-27 MED ORDER — ACETAMINOPHEN 650 MG RE SUPP: 650.0000 mg | Freq: Four times a day (QID) | RECTAL | Status: DC | PRN

## 2024-04-27 MED ORDER — VENLAFAXINE HCL ER 75 MG PO CP24
75.0000 mg | ORAL_CAPSULE | Freq: Three times a day (TID) | ORAL | Status: DC
  Administered 2024-04-27 – 2024-04-30 (×10): 75 mg via ORAL
  Filled 2024-04-27 (×11): qty 1

## 2024-04-27 MED ORDER — DICYCLOMINE HCL 10 MG PO CAPS
10.0000 mg | ORAL_CAPSULE | Freq: Two times a day (BID) | ORAL | Status: DC
  Administered 2024-04-27 – 2024-04-30 (×7): 10 mg via ORAL
  Filled 2024-04-27 (×7): qty 1

## 2024-04-27 MED ORDER — PREDNISONE 20 MG PO TABS
40.0000 mg | ORAL_TABLET | Freq: Every day | ORAL | Status: DC
  Administered 2024-04-28 – 2024-04-30 (×3): 40 mg via ORAL
  Filled 2024-04-27 (×3): qty 2

## 2024-04-27 MED ORDER — METHYLPREDNISOLONE SODIUM SUCC 125 MG IJ SOLR
125.0000 mg | Freq: Once | INTRAMUSCULAR | Status: AC
  Administered 2024-04-27: 125 mg via INTRAVENOUS
  Filled 2024-04-27: qty 2

## 2024-04-27 MED ORDER — ACETAMINOPHEN 325 MG PO TABS: 650.0000 mg | ORAL_TABLET | Freq: Four times a day (QID) | ORAL | Status: DC | PRN

## 2024-04-27 MED ORDER — AMLODIPINE BESYLATE 5 MG PO TABS
5.0000 mg | ORAL_TABLET | Freq: Every day | ORAL | Status: DC
  Administered 2024-04-27 – 2024-04-29 (×3): 5 mg via ORAL
  Filled 2024-04-27 (×3): qty 1

## 2024-04-27 MED ORDER — IRON SUCROSE 200 MG IVPB - SIMPLE MED
200.0000 mg | Freq: Once | Status: AC
  Administered 2024-04-27: 200 mg via INTRAVENOUS
  Filled 2024-04-27: qty 200

## 2024-04-27 MED ORDER — FLUTICASONE PROPIONATE 50 MCG/ACT NA SUSP
1.0000 | Freq: Every day | NASAL | Status: DC
  Administered 2024-04-27 – 2024-04-29 (×3): 1 via NASAL
  Filled 2024-04-27: qty 16

## 2024-04-27 MED ORDER — TRAZODONE HCL 50 MG PO TABS
25.0000 mg | ORAL_TABLET | Freq: Every evening | ORAL | Status: DC | PRN
  Administered 2024-04-29: 25 mg via ORAL
  Filled 2024-04-27: qty 1

## 2024-04-27 MED ORDER — APIXABAN 5 MG PO TABS
5.0000 mg | ORAL_TABLET | Freq: Two times a day (BID) | ORAL | Status: DC
  Administered 2024-04-27 – 2024-04-30 (×7): 5 mg via ORAL
  Filled 2024-04-27 (×7): qty 1

## 2024-04-27 MED ORDER — ONDANSETRON HCL 4 MG/2ML IJ SOLN
4.0000 mg | Freq: Four times a day (QID) | INTRAMUSCULAR | Status: DC | PRN
Start: 2024-04-27 — End: 2024-04-30

## 2024-04-27 MED ORDER — TRAMADOL HCL 50 MG PO TABS: 50.0000 mg | ORAL_TABLET | Freq: Four times a day (QID) | ORAL | Status: DC | PRN

## 2024-04-27 MED ORDER — ONDANSETRON HCL 4 MG PO TABS: 4.0000 mg | ORAL_TABLET | Freq: Four times a day (QID) | ORAL | Status: DC | PRN

## 2024-04-27 MED ORDER — SODIUM CHLORIDE 0.9 % IV SOLN: 10.0000 mL/h | Freq: Once | INTRAVENOUS | Status: DC

## 2024-04-27 MED ORDER — ASPIRIN 81 MG PO TBEC
81.0000 mg | DELAYED_RELEASE_TABLET | Freq: Every day | ORAL | Status: DC
  Administered 2024-04-27 – 2024-04-30 (×4): 81 mg via ORAL
  Filled 2024-04-27 (×4): qty 1

## 2024-04-27 MED ORDER — ANASTROZOLE 1 MG PO TABS
1.0000 mg | ORAL_TABLET | Freq: Every day | ORAL | Status: DC
  Administered 2024-04-27 – 2024-04-30 (×4): 1 mg via ORAL
  Filled 2024-04-27 (×4): qty 1

## 2024-04-27 MED ORDER — IPRATROPIUM-ALBUTEROL 0.5-2.5 (3) MG/3ML IN SOLN
3.0000 mL | Freq: Four times a day (QID) | RESPIRATORY_TRACT | Status: DC
  Administered 2024-04-27: 3 mL via RESPIRATORY_TRACT
  Filled 2024-04-27: qty 3

## 2024-04-27 MED ORDER — EZETIMIBE 10 MG PO TABS
10.0000 mg | ORAL_TABLET | Freq: Every day | ORAL | Status: DC
  Administered 2024-04-27 – 2024-04-30 (×4): 10 mg via ORAL
  Filled 2024-04-27 (×4): qty 1

## 2024-04-27 MED ORDER — UMECLIDINIUM BROMIDE 62.5 MCG/ACT IN AEPB
1.0000 | INHALATION_SPRAY | Freq: Every day | RESPIRATORY_TRACT | Status: DC
  Administered 2024-04-28 – 2024-04-30 (×3): 1 via RESPIRATORY_TRACT
  Filled 2024-04-27: qty 7

## 2024-04-27 MED ORDER — IPRATROPIUM-ALBUTEROL 0.5-2.5 (3) MG/3ML IN SOLN
3.0000 mL | Freq: Three times a day (TID) | RESPIRATORY_TRACT | Status: DC
  Administered 2024-04-27 – 2024-04-28 (×3): 3 mL via RESPIRATORY_TRACT
  Filled 2024-04-27 (×2): qty 3

## 2024-04-27 MED ORDER — MONTELUKAST SODIUM 10 MG PO TABS
10.0000 mg | ORAL_TABLET | Freq: Every day | ORAL | Status: DC
  Administered 2024-04-27 – 2024-04-29 (×3): 10 mg via ORAL
  Filled 2024-04-27 (×3): qty 1

## 2024-04-27 NOTE — Progress Notes (Signed)
 Mobility Specialist - Progress Note   04/27/24 0859  Mobility  Activity Ambulated with assistance in room;Ambulated with assistance to bathroom;Stood at bedside;Dangled on edge of bed  Level of Assistance Standby assist, set-up cues, supervision of patient - no hands on  Assistive Device Front wheel walker  Distance Ambulated (ft) 10 ft  Activity Response Tolerated well  Mobility Referral Yes  Mobility visit 1 Mobility  Mobility Specialist Start Time (ACUTE ONLY) [id number]  Mobility Specialist Stop Time (ACUTE ONLY) 0849  Mobility Specialist Time Calculation (min) (ACUTE ONLY) 11 min   Pt supine in bed requesting to go to bathroom on 4L upon arrival. Pt completes bed mobility, STS, and ambulates to/from bathroom Supervision. Pt returns to bed with needs in reach. Bed alarm not working, NS notified.   Wash Hack  Mobility Specialist  04/27/24 9:01 AM

## 2024-04-27 NOTE — ED Triage Notes (Signed)
 Pt coming from home SOB. Pt sts it stated a few days ago and this am when she woke up she sts, " I could not breath". Pt gave herself home douneb no relief. Fire squad applied oxygen d/t sats in low 80s. Sats with EMS sts they gave her abluterol tx (1). Sats and condition improvedd some with sats 96%. Pt has a hx of COPD.

## 2024-04-27 NOTE — Consult Note (Signed)
 CARDIOLOGY CONSULT NOTE               Patient ID: Barbara Contreras MRN: [id number] DOB/AGE: [birth year] 73 y.o.  Admit date: 04/27/2024 Referring Physician Dr. Pincus Bridgeman hospitalist Primary Physician Dr. Jean Michaelis Primary Cardiologist Dr. Bob Burn Reason for Consultation shortness of breath chest pressure respiratory failure  HPI: 73 year old female history of COPD hypertension hyperlipidemia coronary disease peripheral vascular disease hypertrophic cardiomyopathy atrial fibrillation paroxysmal on Eliquis presented with acute respiratory failure shortness of breath hypertensive urgency hypoxemic respiratory failure with flash pulmonary edema versus COPD patient has history of smoking.  Patient states progressive dyspnea shortness of breath over the preceding few days she has had some chest discomfort as well woke up unable to breathe denied any palpitations or tachycardia used home DuoNeb without relief brought in by EMS with sats in the 80s treated with albuterol  and aspirin  then was decided to be mated for further evaluation has history of renal insufficiency followed by nephrology  Review of systems complete and found to be negative unless listed above     Past Medical History:  Diagnosis Date   Arthritis    Asthma    Back pain    Breast cancer (HCC) 2022   Carotid artery stenosis    Carotid stenosis    Coronary artery disease    Depression    Emphysema of lung (HCC)    GERD (gastroesophageal reflux disease)    Heart murmur    Hip pain    History of Lyme disease    HLD (hyperlipidemia)    Hyperlipemia    Hypertension    IBS (irritable bowel syndrome)    IBS (irritable bowel syndrome)    Lyme disease    Personal history of radiation therapy    Pre-diabetes    Sciatica    Sciatica    Seizures (HCC)    Tremor    Vitamin D  deficiency    Vitamin D  deficiency     Past Surgical History:  Procedure Laterality Date   ABDOMINAL HYSTERECTOMY     BREAST BIOPSY  Right 07/22/2021   us  bx 1:00 area, venus marker, IMC tubular features   BREAST BIOPSY Right 07/22/2021   us  bx 2:00 ribbon marker, fibroadenoma   BREAST LUMPECTOMY Right 08/24/2021   re-excision   BREAST LUMPECTOMY Right 08/12/2021   CAROTID ENDARTERECTOMY Right    CAROTID PTA/STENT INTERVENTION Left 01/25/2023   Procedure: CAROTID PTA/STENT INTERVENTION;  Surgeon: Celso College, MD;  Location: [facility] INVASIVE CV LAB;  Service: Cardiovascular;  Laterality: Left;   CHOLECYSTECTOMY     COLONOSCOPY WITH PROPOFOL  N/A 09/13/2018   Procedure: COLONOSCOPY WITH PROPOFOL ;  Surgeon: Deveron Fly, MD;  Location: [facility] ENDOSCOPY;  Service: Endoscopy;  Laterality: N/A;   COLONOSCOPY WITH PROPOFOL  N/A 08/17/2023   Procedure: COLONOSCOPY WITH PROPOFOL ;  Surgeon: Shane Darling, MD;  Location: [facility] ENDOSCOPY;  Service: Endoscopy;  Laterality: N/A;   ESOPHAGOGASTRODUODENOSCOPY N/A 05/03/2021   Procedure: ESOPHAGOGASTRODUODENOSCOPY (EGD);  Surgeon: Shane Darling, MD;  Location: [facility] ENDOSCOPY;  Service: Endoscopy;  Laterality: N/A;   HEMOSTASIS CLIP PLACEMENT  08/17/2023   Procedure: HEMOSTASIS CLIP PLACEMENT;  Surgeon: Shane Darling, MD;  Location: [facility] ENDOSCOPY;  Service: Endoscopy;;   paranasal sinusotomy     PART MASTECTOMY,RADIO FREQUENCY LOCALIZER,AXILLARY SENTINEL NODE BIOPSY Right 08/12/2021   Procedure: PART MASTECTOMY,RADIO FREQUENCY LOCALIZER,AXILLARY SENTINEL NODE BIOPSY;  Surgeon: Conrado Delay, DO;  Location: [facility] ORS;  Service: General;  Laterality: Right;   POLYPECTOMY  08/17/2023  Procedure: POLYPECTOMY;  Surgeon: Shane Darling, MD;  Location: [facility] ENDOSCOPY;  Service: Endoscopy;;   RE-EXCISION OF BREAST CANCER,SUPERIOR MARGINS Right 08/24/2021   Procedure: RE-EXCISION OF BREAST CANCER,SUPERIOR MARGINS;  Surgeon: Conrado Delay, DO;  Location: [facility] ORS;  Service: General;  Laterality: Right;   SUBMUCOSAL LIFTING INJECTION  08/17/2023   Procedure: SUBMUCOSAL LIFTING  INJECTION;  Surgeon: Shane Darling, MD;  Location: [facility] ENDOSCOPY;  Service: Endoscopy;;    Medications Prior to Admission  Medication Sig Dispense Refill Last Dose/Taking   amLODipine  (NORVASC ) 5 MG tablet Take 5 mg by mouth daily.   04/26/2024 Morning   anastrozole  (ARIMIDEX ) 1 MG tablet Take 1 tablet (1 mg total) by mouth daily. 90 tablet 2 04/26/2024 Morning   aspirin  EC 81 MG tablet Take 81 mg by mouth daily.   Past Week   baclofen  (LIORESAL ) 10 MG tablet Take 5-10 mg by mouth at bedtime as needed for muscle spasms.   04/26/2024 Bedtime   calcium  carbonate (TUMS EX) 750 MG chewable tablet Chew 1 tablet by mouth daily as needed for heartburn.   Unknown   celecoxib (CELEBREX) 200 MG capsule Take 200 mg by mouth daily.   04/26/2024 Morning   Cholecalciferol  (VITAMIN D3) 10 MCG (400 UNIT) tablet Take 400 Units by mouth daily.   04/26/2024 Morning   clobetasol  (TEMOVATE ) 0.05 % external solution Apply 1 Application topically daily.   04/26/2024 Morning   clopidogrel  (PLAVIX ) 75 MG tablet TAKE 1 TABLET BY MOUTH EVERY DAY 90 tablet 0 04/26/2024 Noon   dicyclomine  (BENTYL ) 10 MG capsule Take 10 mg by mouth in the morning and at bedtime.   04/26/2024 Evening   docusate sodium  (COLACE) 100 MG capsule Take 100 mg by mouth 2 (two) times daily.   04/26/2024 Evening   ELIQUIS 5 MG TABS tablet Take 5 mg by mouth 2 (two) times daily.   04/26/2024 at  8:00 PM   ezetimibe (ZETIA) 10 MG tablet Take 1 tablet by mouth daily.   04/26/2024 Morning   famotidine  (PEPCID ) 20 MG tablet Take 20 mg by mouth 2 (two) times daily.   04/26/2024 Evening   fluticasone (FLONASE) 50 MCG/ACT nasal spray Place 1 spray into both nostrils daily.   04/26/2024 Morning   fluticasone-salmeterol (WIXELA INHUB) 250-50 MCG/ACT AEPB Inhale 1 puff into the lungs in the morning and at bedtime.   04/26/2024 Morning   gabapentin  (NEURONTIN ) 600 MG tablet Take 600 mg by mouth 3 (three) times daily.  5 04/26/2024 Noon   ketoconazole  (NIZORAL ) 2 %  shampoo Apply 1 application topically once a week.   Past Week   lisinopril -hydrochlorothiazide  (ZESTORETIC ) 20-12.5 MG tablet Take 1 tablet by mouth in the morning and at bedtime. Hold until you see your doctor   04/26/2024 Evening   metoprolol  succinate (TOPROL -XL) 25 MG 24 hr tablet Take 1 tablet by mouth daily.   04/26/2024 Morning   montelukast (SINGULAIR) 10 MG tablet Take 1 tablet by mouth at bedtime.   04/26/2024 Bedtime   Multiple Vitamin (MULTIVITAMIN WITH MINERALS) TABS tablet Take 1 tablet by mouth daily.   04/26/2024 Morning   omeprazole (PRILOSEC) 20 MG capsule Take 20 mg by mouth daily.   04/26/2024 Morning   predniSONE (DELTASONE) 5 MG tablet Take 5 mg by mouth daily with breakfast.   04/26/2024 Morning   REPATHA SURECLICK 140 MG/ML SOAJ Inject 140 mg into the skin every 14 (fourteen) days.   04/13/2024   sulfamethoxazole-trimethoprim (BACTRIM DS) 800-160 MG tablet Take 1 tablet by  mouth 2 (two) times daily.   04/26/2024 Evening   tiotropium (SPIRIVA ) 18 MCG inhalation capsule Place 18 mcg into inhaler and inhale daily.   04/27/2024 at  1:30 AM   traMADol  (ULTRAM ) 50 MG tablet Take 50 mg by mouth every 6 (six) hours as needed for moderate pain (pain score 4-6).   04/25/2024   venlafaxine  XR (EFFEXOR -XR) 75 MG 24 hr capsule Take 75 mg by mouth 3 (three) times daily.   04/26/2024 Evening   vitamin B-12 (CYANOCOBALAMIN ) 1000 MCG tablet Take 1,000 mcg by mouth daily.   Past Week   Zinc  Sulfate (ZINC  15 PO) Take 15 mg by mouth daily.   Past Week   Social History   Socioeconomic History   Marital status: Divorced    Spouse name: Not on file   Number of children: Not on file   Years of education: Not on file   Highest education level: Not on file  Occupational History   Not on file  Tobacco Use   Smoking status: Every Day    Current packs/day: 0.50    Average packs/day: 0.5 packs/day for 55.0 years (27.5 ttl pk-yrs)    Types: Cigarettes   Smokeless tobacco: Never  Vaping Use   Vaping  status: Never Used  Substance and Sexual Activity   Alcohol use: Yes    Alcohol/week: 0.0 standard drinks of alcohol    Comment: occasional   Drug use: Yes    Types: Marijuana    Comment: none for 1 week   Sexual activity: Not on file  Other Topics Concern   Not on file  Social History Narrative   [place]25-30 mins; lives by self; no children/husband; sister near by. Smoker 1ppd/marijuana. Alcohol- beer/liqqor; retd from sales.    Social Drivers of Health   Financial Resource Strain: Medium Risk (04/21/2024)   Received from [facility]   Overall Financial Resource Strain (CARDIA)    Difficulty of Paying Living Expenses: Somewhat hard  Food Insecurity: Food Insecurity Present (04/21/2024)   Received from [facility]   Hunger Vital Sign    Worried About Running Out of Food in the Last Year: Sometimes true    Ran Out of Food in the Last Year: Sometimes true  Transportation Needs: No Transportation Needs (04/21/2024)   Received from [facility] - Transportation    In the past 12 months, has lack of transportation kept you from medical appointments or from getting medications?: No    Lack of Transportation (Non-Medical): No  Physical Activity: Not on file  Stress: Not on file  Social Connections: Not on file  Intimate Partner Violence: Not At Risk (11/07/2022)   Humiliation, Afraid, Rape, and Kick questionnaire    Fear of Current or Ex-Partner: No    Emotionally Abused: No    Physically Abused: No    Sexually Abused: No    Family History  Problem Relation Age of Onset   Alcohol abuse Father    Alzheimer's disease Father    Anxiety disorder Father    Depression Father    Alcohol abuse Mother    CAD Mother    Diabetes Mother    Hyperlipidemia Mother    Depression Mother       Review of systems complete and found to be negative unless listed above      PHYSICAL EXAM  General: Well developed, well  nourished, in no acute distress HEENT:  Normocephalic and atramatic Neck:  No JVD.  Lungs: Diffuse rhonchi bilaterally to auscultation and percussion. Heart: HRRR . Normal S1 and S2 without gallops or 3/6 sem murmurs.  Abdomen: Bowel sounds are positive, abdomen soft and non-tender  Msk:  Back normal, normal gait. Normal strength and tone for age. Extremities: No clubbing, cyanosis or edema.   Neuro: Alert and oriented X 3. Psych:  Good affect, responds appropriately  Labs:   Lab Results  Component Value Date   WBC 9.5 04/27/2024   HGB 9.0 (L) 04/27/2024   HCT 29.9 (L) 04/27/2024   MCV 84.7 04/27/2024   PLT 235 04/27/2024    Recent Labs  Lab 04/27/24 0425  NA 141  K 4.3  CL 105  CO2 26  BUN 24*  CREATININE 1.83*  CALCIUM  8.4*  GLUCOSE 126*   Lab Results  Component Value Date   CKTOTAL 299 (H) 10/02/2022   TROPONINI <0.03 06/17/2017   No results found for: "CHOL" No results found for: "HDL" No results found for: "LDLCALC" No results found for: "TRIG" No results found for: "CHOLHDL" No results found for: "LDLDIRECT"    Radiology: [facility] Chest Port 1 View Result Date: 04/27/2024 CLINICAL DATA:  Dyspnea EXAM: PORTABLE CHEST 1 VIEW COMPARISON:  01/29/2023 FINDINGS: Cardiomegaly and interstitial opacity with hazy opacity at the bases greater on the right. No pneumothorax. Artifact from EKG leads. IMPRESSION: Hazy opacification in the lower lungs similar to 01/29/2023, infection or failure could have this appearance. Electronically Signed   By: Ronnette Coke M.D.   On: 04/27/2024 05:27    EKG: Sinus rhythm LVH diffuse nonspecific ST-T wave changes rate of 65  ASSESSMENT AND PLAN:  Shortness of breath COPD Hypertension Hyperlipidemia HOCM Atrial fibrillation Hypoxemia Asthma Peripheral vascular disease . Plan Rate admit to telemetry follow-up EKGs troponins Inhalers for COPD supplemental oxygen for hypoxemia Consider pulmonary input for COPD management Diuresis  for possible hypertrophic cardiomyopathy History of CVA procedure with stent Paroxysmal atrial fibrillation continue anticoagulation with Eliquis consider rhythm control strategy Recommend ischemic workup either with CTA versus stress MRI   Signed: Antonette Batters MD 04/27/2024, 12:28 PM

## 2024-04-27 NOTE — H&P (Addendum)
 History and Physical  Barbara Contreras ZOX:[id number] DOB: Jun 24, 1951 DOA: 04/27/2024  PCP: Baltazar Leventhal, MD   Chief Complaint: Shortness of breath, chest pressure  HPI: Barbara Contreras is a 73 y.o. 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  Patient states she has had experience of progressive shortness of breath of the last few days, however this morning in the early hours it was suddenly worse, she woke up from sleep unable to breathe could not catch her breath.  Denies any chest pain.  She gave herself a home DuoNeb without any relief.  On EMS arrival, she was saturating in the low 80s on room air, they gave her albuterol  treatment placed on oxygen.  She denies any recent significant cough, fevers, chills, chest pain, vomiting.  She does take Eliquis, denies any blood in her stool, urine, or any hematemesis.  She denies lower extremity edema, or orthopnea.  Recently her nephrologist discontinued her lisinopril /HCTZ due to worsening renal function.  Review of Systems: Please see HPI for pertinent positives and negatives. A complete 10 system review of systems are otherwise negative.  Past Medical History:  Diagnosis Date   Arthritis    Asthma    Back pain    Breast cancer (HCC) 2022   Carotid artery stenosis    Carotid stenosis    Coronary artery disease    Depression    Emphysema of lung (HCC)    GERD (gastroesophageal reflux disease)    Heart murmur    Hip pain    History of Lyme disease    HLD (hyperlipidemia)    Hyperlipemia    Hypertension    IBS (irritable bowel syndrome)    IBS (irritable bowel syndrome)    Lyme disease    Personal history of radiation therapy    Pre-diabetes    Sciatica    Sciatica    Seizures (HCC)     Tremor    Vitamin D  deficiency    Vitamin D  deficiency    Past Surgical History:  Procedure Laterality Date   ABDOMINAL HYSTERECTOMY     BREAST BIOPSY Right 07/22/2021   us  bx 1:00 area, venus marker, IMC tubular features   BREAST BIOPSY Right 07/22/2021   us  bx 2:00 ribbon marker, fibroadenoma   BREAST LUMPECTOMY Right 08/24/2021   re-excision   BREAST LUMPECTOMY Right 08/12/2021   CAROTID ENDARTERECTOMY Right    CAROTID PTA/STENT INTERVENTION Left 01/25/2023   Procedure: CAROTID PTA/STENT INTERVENTION;  Surgeon: Celso College, MD;  Location: [facility] INVASIVE [facility];  Service: Cardiovascular;  Laterality: Left;   CHOLECYSTECTOMY     COLONOSCOPY WITH PROPOFOL  N/A 09/13/2018   Procedure: COLONOSCOPY WITH PROPOFOL ;  Surgeon: Deveron Fly, MD;  Location: [facility] ENDOSCOPY;  Service: Endoscopy;  Laterality: N/A;   COLONOSCOPY WITH PROPOFOL  N/A 08/17/2023   Procedure: COLONOSCOPY WITH PROPOFOL ;  Surgeon: Shane Darling, MD;  Location: [facility] ENDOSCOPY;  Service: Endoscopy;  Laterality: N/A;   ESOPHAGOGASTRODUODENOSCOPY N/A 05/03/2021   Procedure: ESOPHAGOGASTRODUODENOSCOPY (EGD);  Surgeon: Shane Darling, MD;  Location: [facility] ENDOSCOPY;  Service: Endoscopy;  Laterality: N/A;   HEMOSTASIS CLIP PLACEMENT  08/17/2023   Procedure: HEMOSTASIS CLIP PLACEMENT;  Surgeon: Shane Darling, MD;  Location: [facility] ENDOSCOPY;  Service: Endoscopy;;   paranasal sinusotomy     PART MASTECTOMY,RADIO FREQUENCY LOCALIZER,AXILLARY SENTINEL  NODE BIOPSY Right 08/12/2021   Procedure: PART MASTECTOMY,RADIO FREQUENCY LOCALIZER,AXILLARY SENTINEL NODE BIOPSY;  Surgeon: Conrado Delay, DO;  Location: [facility] ORS;  Service: General;  Laterality: Right;   POLYPECTOMY  08/17/2023   Procedure: POLYPECTOMY;  Surgeon: Shane Darling, MD;  Location: [facility] ENDOSCOPY;  Service: Endoscopy;;   RE-EXCISION OF BREAST CANCER,SUPERIOR MARGINS Right 08/24/2021   Procedure: RE-EXCISION OF BREAST CANCER,SUPERIOR MARGINS;  Surgeon:  Conrado Delay, DO;  Location: [facility] ORS;  Service: General;  Laterality: Right;   SUBMUCOSAL LIFTING INJECTION  08/17/2023   Procedure: SUBMUCOSAL LIFTING INJECTION;  Surgeon: Shane Darling, MD;  Location: [facility] ENDOSCOPY;  Service: Endoscopy;;   Social History:  reports that she has been smoking cigarettes. She has a 27.5 pack-year smoking history. She has never used smokeless tobacco. She reports current alcohol use. She reports current drug use. Drug: Marijuana.  Allergies  Allergen Reactions   Morphine And Codeine Itching   Clindamycin Other (See Comments)   Erythromycin     Other reaction(s): Other (see comments)   Other     Other reaction(s): Other (See Comments) Redness and itching   Penicillins Other (See Comments)    TOLERATED CEFAZOLIN  PRIOR Reaction: unknown Has patient had a PCN reaction causing immediate rash, facial/tongue/throat swelling, SOB or lightheadedness with hypotension: Unknown Has patient had a PCN reaction causing severe rash involving mucus membranes or skin necrosis: Unknown Has patient had a PCN reaction that required hospitalization: Unknown Has patient had a PCN reaction occurring within the last 10 years: no If all of the above answers are "NO", then may proceed with Cephalosporin use.   Promethazine-Phenylephrine  Other (See Comments)    Muscle spasm   Tape Other (See Comments)    Redness and itching    Family History  Problem Relation Age of Onset   Alcohol abuse Father    Alzheimer's disease Father    Anxiety disorder Father    Depression Father    Alcohol abuse Mother    CAD Mother    Diabetes Mother    Hyperlipidemia Mother    Depression Mother      Prior to Admission medications   Medication Sig Start Date End Date Taking? Authorizing Provider  amLODipine  (NORVASC ) 5 MG tablet Take 5 mg by mouth daily. 03/10/22  Yes [provider]  anastrozole  (ARIMIDEX ) 1 MG tablet Take 1 tablet (1 mg total) by mouth daily. 03/10/24  Yes  Brahmanday, Govinda R, MD  aspirin  EC 81 MG tablet Take 81 mg by mouth daily.   Yes [provider]  baclofen  (LIORESAL ) 10 MG tablet Take 5-10 mg by mouth at bedtime as needed for muscle spasms. 06/08/21  Yes [provider]  calcium  carbonate (TUMS EX) 750 MG chewable tablet Chew 1 tablet by mouth daily as needed for heartburn.   Yes [provider]  celecoxib (CELEBREX) 200 MG capsule Take 200 mg by mouth daily. 04/25/24 05/25/24 Yes [provider]  Cholecalciferol  (VITAMIN D3) 10 MCG (400 UNIT) tablet Take 400 Units by mouth daily.   Yes [provider]  clobetasol  (TEMOVATE ) 0.05 % external solution Apply 1 Application topically daily. 09/18/22  Yes [provider]  clopidogrel  (PLAVIX ) 75 MG tablet TAKE 1 TABLET BY MOUTH EVERY DAY 04/14/24  Yes Dew, Donald Frost, MD  dicyclomine  (BENTYL ) 10 MG capsule Take 10 mg by mouth in the morning and at bedtime.   Yes [provider]  docusate sodium  (COLACE) 100 MG capsule Take 100 mg by mouth 2 (two) times  daily. 08/24/21  Yes [provider]  ELIQUIS 5 MG TABS tablet Take 5 mg by mouth 2 (two) times daily.   Yes [provider]  ezetimibe (ZETIA) 10 MG tablet Take 1 tablet by mouth daily. 04/15/24  Yes [provider]  famotidine  (PEPCID ) 20 MG tablet Take 20 mg by mouth 2 (two) times daily. 07/17/22  Yes [provider]  fluticasone (FLONASE) 50 MCG/ACT nasal spray Place 1 spray into both nostrils daily. 05/03/23  Yes [provider]  fluticasone-salmeterol (WIXELA INHUB) 250-50 MCG/ACT AEPB Inhale 1 puff into the lungs in the morning and at bedtime.   Yes [provider]  gabapentin  (NEURONTIN ) 600 MG tablet Take 600 mg by mouth 3 (three) times daily. 01/24/16  Yes [provider]  ketoconazole  (NIZORAL ) 2 % shampoo Apply 1 application topically once a week.   Yes [provider]  lisinopril -hydrochlorothiazide  (ZESTORETIC )  20-12.5 MG tablet Take 1 tablet by mouth in the morning and at bedtime. Hold until you see your doctor 11/07/22  Yes Luna Salinas, MD  metoprolol  succinate (TOPROL -XL) 25 MG 24 hr tablet Take 1 tablet by mouth daily. 04/08/24 04/08/25 Yes [provider]  montelukast (SINGULAIR) 10 MG tablet Take 1 tablet by mouth at bedtime. 04/21/24  Yes [provider]  Multiple Vitamin (MULTIVITAMIN WITH MINERALS) TABS tablet Take 1 tablet by mouth daily.   Yes [provider]  omeprazole (PRILOSEC) 20 MG capsule Take 20 mg by mouth daily. 04/15/24  Yes [provider]  predniSONE (DELTASONE) 5 MG tablet Take 5 mg by mouth daily with breakfast. 04/21/24  Yes [provider]  REPATHA SURECLICK 140 MG/ML SOAJ Inject 140 mg into the skin every 14 (fourteen) days.   Yes [provider]  sulfamethoxazole-trimethoprim (BACTRIM DS) 800-160 MG tablet Take 1 tablet by mouth 2 (two) times daily. 04/21/24 04/28/24 Yes [provider]  tiotropium (SPIRIVA ) 18 MCG inhalation capsule Place 18 mcg into inhaler and inhale daily. 04/21/24  Yes [provider]  traMADol  (ULTRAM ) 50 MG tablet Take 50 mg by mouth every 6 (six) hours as needed for moderate pain (pain score 4-6). 04/25/24  Yes [provider]  venlafaxine  XR (EFFEXOR -XR) 75 MG 24 hr capsule Take 75 mg by mouth 3 (three) times daily.   Yes [provider]  vitamin B-12 (CYANOCOBALAMIN ) 1000 MCG tablet Take 1,000 mcg by mouth daily.   Yes [provider]  Zinc  Sulfate (ZINC  15 PO) Take 15 mg by mouth daily.   Yes [provider]    Physical Exam: BP (!) 185/71   Pulse 79   Temp 98.4 F (36.9 C)   Resp 20   Ht 5' 5.5" (1.664 m)   Wt 70.8 kg   SpO2 100%   BMI 25.56 kg/m  General:  Alert, oriented, calm, in no acute distress, slightly tachypneic but resting comfortably on 4 L nasal cannula oxygen.  No cough, able to speak in full sentences Cardiovascular: RRR, no  murmurs or rubs, no peripheral edema  Respiratory: clear to auscultation bilaterally with distant breath sounds, no wheezes, no crackles  Abdomen: soft, nontender, nondistended, normal bowel tones heard  Skin: dry, no rashes  Musculoskeletal: no joint effusions, normal range of motion  Psychiatric: appropriate affect, normal speech  Neurologic: extraocular muscles intact, clear speech, moving all extremities with intact sensorium         Labs on Admission:  Basic Metabolic Panel: Recent Labs  Lab 04/27/24 0425  NA 141  K 4.3  CL 105  CO2 26  GLUCOSE 126*  BUN 24*  CREATININE 1.83*  CALCIUM  8.4*   Liver Function Tests: No results for input(s): "AST", "ALT", "ALKPHOS", "BILITOT", "PROT", "ALBUMIN" in the last 168 hours. No results for input(s): "LIPASE", "AMYLASE" in the last 168 hours. No results for input(s): "AMMONIA" in the last 168 hours. CBC: Recent Labs  Lab 04/27/24 0425  WBC 8.8  HGB 7.4*  HCT 25.8*  MCV 84.9  PLT 236   Cardiac Enzymes: No results for input(s): "CKTOTAL", "CKMB", "CKMBINDEX", "TROPONINI" in the last 168 hours. BNP (last 3 results) Recent Labs    04/27/24 0425  BNP 901.6*    ProBNP (last 3 results) No results for input(s): "PROBNP" in the last 8760 hours.  CBG: No results for input(s): "GLUCAP" in the last 168 hours.  Radiological Exams on Admission: DG Chest Port 1 View Result Date: 04/27/2024 CLINICAL DATA:  Dyspnea EXAM: PORTABLE CHEST 1 VIEW COMPARISON:  01/29/2023 FINDINGS: Cardiomegaly and interstitial opacity with hazy opacity at the bases greater on the right. No pneumothorax. Artifact from EKG leads. IMPRESSION: Hazy opacification in the lower lungs similar to 01/29/2023, infection or failure could have this appearance. Electronically Signed   By: Ronnette Coke M.D.   On: 04/27/2024 05:27   Assessment/Plan Barbara Contreras is a 73 y.o. female with medical history significant for COPD on room air, hypertension, hyperlipidemia,  CKD stage III, CAD, HOCM, atrial fibrillation on Eliquis admitted to the hospital with hypertensive urgency, hypoxic respiratory failure possibly due to acute heart failure versus acute exacerbation of COPD.   Acute hypoxic respiratory failure-etiology not obvious at this time, could be exacerbation of COPD, mild volume overload, uncontrolled blood pressure or a combination of these.  Currently patient is feeling better on supplemental oxygen, after receiving breathing treatments and IV Solu-Medrol . -Observation admission -Continue supplemental oxygen, and wean as tolerated -Treat COPD as below -Given elevated BNP, bilateral pulmonary opacification and elevated blood pressure, I wonder if there is a component of heart failure.  Given her HOCM, have requested cardiology assistance in evaluating this.  Acute exacerbation of COPD-patient is on room air at baseline.  COPD exacerbation suspected due to increased cough, improvement with steroids and oxygen supplementation.   -Continue home Incruse Ellipta, Singulair, Flonase -Improved with IV Solu-Medrol , will start p.o. prednisone in the morning and plan to taper -Albuterol  nebulizer as needed  Suspected diastolic heart failure-given elevated BNP, hypoxia, and chest x-ray findings.  Followed by cardiology and has recent discovery of hypertrophic cardiomyopathy.  While report is not visible to me, recent cardiology notes from earlier this month reference echo April 2025 which revealed severe LVH, normal systolic function no mention of diastolic dysfunction.  Hypertension-initially uncontrolled, possibly due to respiratory distress.  Patient is not overtly fluid overloaded.  Note that her lisinopril /HCTZ were discontinued recently due to her CKD. -Amlodipine  5 mg p.o. daily  Atrial fibrillation, recent diagnosis for her-continue Toprol -XL, and Eliquis  Acute on chronic anemia-patient without overt signs or symptoms of bleeding, she has iron deficiency  on anemia panel drawn this morning.  Severe anemia is undoubtedly contributing to her dyspnea. -ER provider has ordered 1 unit PRBC transfusion -Monitor posttransfusion hemoglobin closely -With no evidence of bleeding, safe to continue Eliquis -IV Venofer  Peripheral neuropathy-gabapentin , at reduced dose due to acute kidney injury  Acute on CKD 3-followed as an outpatient by nephrology, noted to have fluctuating renal function.  Her lisinopril Ilona Malta has been held since April.  Abnormal  troponin-likely from stress in the setting of hypoxia and hypertension, on background of CKD.  Patient without acute EKG changes, or complaints of chest pain. -Will obtain a third troponin later this morning -Likely no intervention indicated, but cardiology input appreciated  DVT prophylaxis: Eliquis    Code Status: Full Code  Consults called: Cardiology  Admission status: Observation  Time spent: 59 minutes  Devantae Babe Rickey Charm MD [facility] Pager [phone]  If 7PM-7AM, please contact night-coverage [email or web address] Password [email or web address]  04/27/2024, 7:37 AM

## 2024-04-27 NOTE — ED Provider Notes (Signed)
 [facility] Provider Note    Event Date/Time   First MD Initiated Contact with Patient 04/27/24 [phone]     (approximate)   History   Shortness of Breath   HPI  Barbara Contreras is a 73 y.o. female with history of COPD, hypertension, hyperlipidemia, coronary artery disease, HOCM, a fib who presents to the emergency department with shortness of breath, chest tightness.  Found to be hypoxic with sats in the 80s with EMS.  Does not wear oxygen chronically.  No fevers, productive cough.  No calf tenderness calf swelling.  No history of PE or DVT.  Given breathing treatments en route with EMS.   History provided by patient, family, EMS.    Past Medical History:  Diagnosis Date   Arthritis    Asthma    Back pain    Breast cancer (HCC) 2022   Carotid artery stenosis    Carotid stenosis    Coronary artery disease    Depression    Emphysema of lung (HCC)    GERD (gastroesophageal reflux disease)    Heart murmur    Hip pain    History of Lyme disease    HLD (hyperlipidemia)    Hyperlipemia    Hypertension    IBS (irritable bowel syndrome)    IBS (irritable bowel syndrome)    Lyme disease    Personal history of radiation therapy    Pre-diabetes    Sciatica    Sciatica    Seizures (HCC)    Tremor    Vitamin D  deficiency    Vitamin D  deficiency     Past Surgical History:  Procedure Laterality Date   ABDOMINAL HYSTERECTOMY     BREAST BIOPSY Right 07/22/2021   us  bx 1:00 area, venus marker, IMC tubular features   BREAST BIOPSY Right 07/22/2021   us  bx 2:00 ribbon marker, fibroadenoma   BREAST LUMPECTOMY Right 08/24/2021   re-excision   BREAST LUMPECTOMY Right 08/12/2021   CAROTID ENDARTERECTOMY Right    CAROTID PTA/STENT INTERVENTION Left 01/25/2023   Procedure: CAROTID PTA/STENT INTERVENTION;  Surgeon: Celso College, MD;  Location: [facility] INVASIVE CV LAB;  Service: Cardiovascular;  Laterality: Left;   CHOLECYSTECTOMY     COLONOSCOPY WITH  PROPOFOL  N/A 09/13/2018   Procedure: COLONOSCOPY WITH PROPOFOL ;  Surgeon: Deveron Fly, MD;  Location: [facility] ENDOSCOPY;  Service: Endoscopy;  Laterality: N/A;   COLONOSCOPY WITH PROPOFOL  N/A 08/17/2023   Procedure: COLONOSCOPY WITH PROPOFOL ;  Surgeon: Shane Darling, MD;  Location: [facility] ENDOSCOPY;  Service: Endoscopy;  Laterality: N/A;   ESOPHAGOGASTRODUODENOSCOPY N/A 05/03/2021   Procedure: ESOPHAGOGASTRODUODENOSCOPY (EGD);  Surgeon: Shane Darling, MD;  Location[facility] ENDOSCOPY;  Service: Endoscopy;  Laterality: N/A;   HEMOSTASIS CLIP PLACEMENT  08/17/2023   Procedure: HEMOSTASIS CLIP PLACEMENT;  Surgeon: Shane Darling, MD;  Location: [facility] ENDOSCOPY;  Service: Endoscopy;;   paranasal sinusotomy     PART MASTECTOMY,RADIO FREQUENCY LOCALIZER,AXILLARY SENTINEL NODE BIOPSY Right 08/12/2021   Procedure: PART MASTECTOMY,RADIO FREQUENCY LOCALIZER,AXILLARY SENTINEL NODE BIOPSY;  Surgeon: Conrado Delay, DO;  Location: [facility] ORS;  Service: General;  Laterality: Right;   POLYPECTOMY  08/17/2023   Procedure: POLYPECTOMY;  Surgeon: Shane Darling, MD;  Location: [facility] ENDOSCOPY;  Service: Endoscopy;;   RE-EXCISION OF BREAST CANCER,SUPERIOR MARGINS Right 08/24/2021   Procedure: RE-EXCISION OF BREAST CANCER,SUPERIOR MARGINS;  Surgeon: Conrado Delay, DO;  Location: [facility] ORS;  Service: General;  Laterality: Right;   SUBMUCOSAL LIFTING INJECTION  08/17/2023   Procedure: SUBMUCOSAL LIFTING INJECTION;  Surgeon: Shane Darling, MD;  Location: [facility] ENDOSCOPY;  Service: Endoscopy;;    MEDICATIONS:  Prior to Admission medications   Medication Sig Start Date End Date Taking? Authorizing Provider  albuterol  (VENTOLIN  HFA) 108 (90 Base) MCG/ACT inhaler Inhale 1-2 puffs into the lungs every 6 (six) hours as needed for wheezing or shortness of breath.    [provider]  amLODipine  (NORVASC ) 5 MG tablet Take 5 mg by mouth daily. 03/10/22   [provider]  anastrozole  (ARIMIDEX ) 1  MG tablet Take 1 tablet (1 mg total) by mouth daily. 03/10/24   Brahmanday, Govinda R, MD  aspirin  EC 81 MG tablet Take 81 mg by mouth daily.    [provider]  atorvastatin  (LIPITOR) 80 MG tablet Take 80 mg by mouth daily. 10/01/22   [provider]  baclofen  (LIORESAL ) 10 MG tablet Take 5-10 mg by mouth at bedtime as needed for muscle spasms. 06/08/21   [provider]  calcium  carbonate (TUMS EX) 750 MG chewable tablet Chew 1 tablet by mouth daily as needed for heartburn.    [provider]  Cholecalciferol  (VITAMIN D3) 10 MCG (400 UNIT) tablet Take 400 Units by mouth daily.    [provider]  clobetasol  (TEMOVATE ) 0.05 % external solution Apply 1 Application topically daily. 09/18/22   [provider]  clopidogrel  (PLAVIX ) 75 MG tablet TAKE 1 TABLET BY MOUTH EVERY DAY 04/14/24   Dew, Jason S, MD  dicyclomine  (BENTYL ) 10 MG capsule Take 10 mg by mouth in the morning and at bedtime.    [provider]  docusate sodium  (COLACE) 100 MG capsule Take 100 mg by mouth 2 (two) times daily. 08/24/21   [provider]  famotidine  (PEPCID ) 20 MG tablet Take 20 mg by mouth 2 (two) times daily. 07/17/22   [provider]  fluticasone  (FLONASE ) 50 MCG/ACT nasal spray Place 2 sprays into both nostrils as needed. 05/03/23   [provider]  gabapentin  (NEURONTIN ) 600 MG tablet Take 600 mg by mouth 3 (three) times daily. 01/24/16   [provider]  ketoconazole  (NIZORAL ) 2 % shampoo Apply 1 application topically once a week.    [provider]  lisinopril -hydrochlorothiazide  (ZESTORETIC ) 20-12.5 MG tablet Take 1 tablet by mouth in the morning and at bedtime. Hold until you see your doctor 11/07/22   Luna Salinas, MD  Multiple Vitamin (MULTIVITAMIN WITH MINERALS) TABS tablet Take 1 tablet by mouth daily.    [provider]  venlafaxine  XR (EFFEXOR -XR) 75 MG 24 hr capsule Take 75 mg by mouth 3 (three) times  daily.    [provider]  vitamin B-12 (CYANOCOBALAMIN ) 1000 MCG tablet Take 1,000 mcg by mouth daily.    [provider]  Zinc  Sulfate (ZINC  15 PO) Take 15 mg by mouth daily.    [provider]    Physical Exam   Triage Vital Signs: ED Triage Vitals  Encounter Vitals Group     BP 04/27/24 0417 (!) 182/73     Systolic BP Percentile --      Diastolic BP Percentile --      Pulse Rate 04/27/24 0417 64     Resp 04/27/24 0417 (!) 24     Temp 04/27/24 0420 97.9 F (36.6 C)     Temp Source 04/27/24 0420 Oral     SpO2 04/27/24 0417 (!) 89 %     Weight 04/27/24 0420 156 lb (70.8 kg)     Height 04/27/24 0420 5' 5.5" (  1.664 m)     Head Circumference --      Peak Flow --      Pain Score --      Pain Loc --      Pain Education --      Exclude from Growth Chart --     Most recent vital signs: Vitals:   04/27/24 0656 04/27/24 0756  BP: (!) 185/71 (!) 159/136  Pulse: 79 83  Resp: 20 20  Temp: 98.4 F (36.9 C) 97.7 F (36.5 C)  SpO2: 100% 98%    CONSTITUTIONAL: Alert, responds appropriately to questions.  Elderly. HEAD: Normocephalic, atraumatic EYES: Conjunctivae clear, pupils appear equal, sclera nonicteric ENT: normal nose; moist mucous membranes NECK: Supple, normal ROM CARD: RRR; S1 and S2 appreciated RESP: Mildly tachypneic.  Speaking full sentences.  No distress.  Diffuse inspiratory and expiratory wheezing.  Diminished aeration at bases bilaterally. ABD/GI: Non-distended; soft, non-tender, no rebound, no guarding, no peritoneal signs RECTAL:  Normal rectal tone, no gross blood or melena, guaiac NEGATIVE, no hemorrhoids appreciated, nontender rectal exam, no fecal impaction. BACK: The back appears normal EXT: Normal ROM in all joints; no deformity noted, no edema, no calf tenderness or calf swelling SKIN: Normal color for age and race; warm; no rash on exposed skin NEURO: Moves all extremities equally, normal speech PSYCH: The patient's mood and  manner are appropriate.   ED Results / Procedures / Treatments   LABS: (all labs ordered are listed, but only abnormal results are displayed) Labs Reviewed  BASIC METABOLIC PANEL WITH GFR - Abnormal; Notable for the following components:      Result Value   Glucose, Bld 126 (*)    BUN 24 (*)    Creatinine, Ser 1.83 (*)    Calcium  8.4 (*)    GFR, Estimated 29 (*)    All other components within normal limits  CBC - Abnormal; Notable for the following components:   RBC 3.04 (*)    Hemoglobin 7.4 (*)    HCT 25.8 (*)    MCH 24.3 (*)    MCHC 28.7 (*)    RDW 17.1 (*)    All other components within normal limits  PROTIME-INR - Abnormal; Notable for the following components:   Prothrombin Time 17.8 (*)    INR 1.5 (*)    All other components within normal limits  BRAIN NATRIURETIC PEPTIDE - Abnormal; Notable for the following components:   B Natriuretic Peptide 901.6 (*)    All other components within normal limits  IRON AND TIBC - Abnormal; Notable for the following components:   Iron 19 (*)    TIBC 538 (*)    Saturation Ratios 4 (*)    All other components within normal limits  FERRITIN - Abnormal; Notable for the following components:   Ferritin 6 (*)    All other components within normal limits  RETICULOCYTES - Abnormal; Notable for the following components:   Retic Ct Pct 3.6 (*)    RBC. 2.98 (*)    All other components within normal limits  TROPONIN I (HIGH SENSITIVITY) - Abnormal; Notable for the following components:   Troponin I (High Sensitivity) 28 (*)    All other components within normal limits  TROPONIN I (HIGH SENSITIVITY) - Abnormal; Notable for the following components:   Troponin I (High Sensitivity) 85 (*)    All other components within normal limits  FOLATE  VITAMIN B12  CBC  TYPE AND SCREEN  PREPARE RBC (CROSSMATCH)  ABO/RH  EKG:  EKG Interpretation Date/Time:  Sunday Apr 27 2024 04:17:29 EDT Ventricular Rate:  64 PR Interval:  156 QRS  Duration:  76 QT Interval:  391 QTC Calculation: 404 R Axis:   45  Text Interpretation: Sinus rhythm LVH with secondary repolarization abnormality No significant change since last tracing Confirmed by Verneda Golder [phone]) on 04/27/2024 4:41:56 AM         RADIOLOGY: My personal review and interpretation of imaging: Chest x-ray shows no acute abnormality.  I have personally reviewed all radiology reports.   DG Chest Port 1 View Result Date: 04/27/2024 CLINICAL DATA:  Dyspnea EXAM: PORTABLE CHEST 1 VIEW COMPARISON:  01/29/2023 FINDINGS: Cardiomegaly and interstitial opacity with hazy opacity at the bases greater on the right. No pneumothorax. Artifact from EKG leads. IMPRESSION: Hazy opacification in the lower lungs similar to 01/29/2023, infection or failure could have this appearance. Electronically Signed   By: Ronnette Coke M.D.   On: 04/27/2024 05:27     PROCEDURES:  Critical Care performed: Yes, see critical care procedure note(s)   CRITICAL CARE Performed by: Starling Eck Shmiel Morton   Total critical care time: 40 minutes  Critical care time was exclusive of separately billable procedures and treating other patients.  Critical care was necessary to treat or prevent imminent or life-threatening deterioration.  Critical care was time spent personally by me on the following activities: development of treatment plan with patient and/or surrogate as well as nursing, discussions with consultants, evaluation of patient's response to treatment, examination of patient, obtaining history from patient or surrogate, ordering and performing treatments and interventions, ordering and review of laboratory studies, ordering and review of radiographic studies, pulse oximetry and re-evaluation of patient's condition.   Aaron Aas1-3 Lead EKG Interpretation  Performed by: Shreena Baines, Clover Dao, DO Authorized by: Flannery Cavallero, Clover Dao, DO     Interpretation: normal     ECG rate:  83   ECG rate assessment: normal      Rhythm: sinus rhythm     Ectopy: none     Conduction: normal       IMPRESSION / MDM / ASSESSMENT AND PLAN / ED COURSE  I reviewed the triage vital signs and the nursing notes.    Patient here with shortness of breath, wheezing.  New oxygen requirement.  The patient is on the cardiac monitor to evaluate for evidence of arrhythmia and/or significant heart rate changes.   DIFFERENTIAL DIAGNOSIS (includes but not limited to):   COPD exacerbation, pneumonia, CHF, ACS, PE, anemia   Patient's presentation is most consistent with acute presentation with potential threat to life or bodily function.   PLAN: Will continue breathing treatments, Solu-Medrol .  Will obtain labs, chest x-ray.  Patient will need admission given new oxygen requirement.   MEDICATIONS GIVEN IN ED: Medications  0.9 %  sodium chloride  infusion (has no administration in time range)  traMADol  (ULTRAM ) tablet 50 mg (has no administration in time range)  aspirin  EC tablet 81 mg (has no administration in time range)  anastrozole  (ARIMIDEX ) tablet 1 mg (has no administration in time range)  amLODipine  (NORVASC ) tablet 5 mg (has no administration in time range)  ezetimibe (ZETIA) tablet 10 mg (has no administration in time range)  metoprolol  succinate (TOPROL -XL) 24 hr tablet 25 mg (has no administration in time range)  venlafaxine  XR (EFFEXOR -XR) 24 hr capsule 75 mg (has no administration in time range)  pantoprazole  (PROTONIX ) EC tablet 40 mg (has no administration in time range)  docusate sodium  (COLACE) capsule 100 mg (  has no administration in time range)  dicyclomine  (BENTYL ) capsule 10 mg (has no administration in time range)  apixaban (ELIQUIS) tablet 5 mg (has no administration in time range)  gabapentin  (NEURONTIN ) capsule 300 mg (has no administration in time range)  fluticasone (FLONASE) 50 MCG/ACT nasal spray 1 spray (has no administration in time range)  montelukast (SINGULAIR) tablet 10 mg (has no  administration in time range)  umeclidinium bromide (INCRUSE ELLIPTA) 62.5 MCG/ACT 1 puff (has no administration in time range)  methylPREDNISolone  sodium succinate (SOLU-MEDROL ) 40 mg/mL injection 40 mg (has no administration in time range)  acetaminophen  (TYLENOL ) tablet 650 mg (has no administration in time range)    Or  acetaminophen  (TYLENOL ) suppository 650 mg (has no administration in time range)  traZODone  (DESYREL ) tablet 25 mg (has no administration in time range)  ondansetron  (ZOFRAN ) tablet 4 mg (has no administration in time range)    Or  ondansetron  (ZOFRAN ) injection 4 mg (has no administration in time range)  albuterol  (PROVENTIL ) (2.5 MG/3ML) 0.083% nebulizer solution 2.5 mg (has no administration in time range)  ipratropium-albuterol  (DUONEB) 0.5-2.5 (3) MG/3ML nebulizer solution 3 mL (has no administration in time range)  ipratropium-albuterol  (DUONEB) 0.5-2.5 (3) MG/3ML nebulizer solution 3 mL (3 mLs Nebulization Given 04/27/24 0512)  methylPREDNISolone  sodium succinate (SOLU-MEDROL ) 125 mg/2 mL injection 125 mg (125 mg Intravenous Given 04/27/24 0512)  ipratropium-albuterol  (DUONEB) 0.5-2.5 (3) MG/3ML nebulizer solution 3 mL (3 mLs Nebulization Given 04/27/24 0658)     ED COURSE: Symptoms improving with breathing treatments.  Labs show hemoglobin of 7.4 which is new compared to April 2025.  She denies any bloody stools or melena and is guaiac negative here.  She does have creatinine of 1.83 which is also new for her.  Kidney disease could be the cause of her anemia but will check an anemia panel.  Given she does have a history of CHF, will give her a unit of packed red blood cells to get hemoglobin greater than 8.  First troponin minimally elevated.  Second pending.  She denies any chest tightness currently.  EKG shows no new ischemic change.  She denies history of CHF but prior echocardiogram shows grade 1 diastolic dysfunction.  BNP slightly elevated at 900 but she has no  peripheral edema, JVD.  Chest x-ray reviewed and interpreted by myself and the radiologist and shows opacification in the lower lungs but this is similar compared to previous chest x-ray.  I am concerned about giving her Lasix  for diuresis when clinically she does not appear volume overloaded and has a new AKI.  She is improving with breathing treatments and I think this is more likely COPD.  No infectious symptoms.  No leukocytosis.  No fever.  Low suspicion for pneumonia.  Will discuss with the hospitalist for admission.   CONSULTS:  Consulted and discussed patient's case with hospitalist, Dr. Vallarie Gauze.  I have recommended admission and consulting physician agrees and will place admission orders.  Patient (and family if present) agree with this plan.   I reviewed all nursing notes, vitals, pertinent previous records.  All labs, EKGs, imaging ordered have been independently reviewed and interpreted by myself.    OUTSIDE RECORDS REVIEWED: Reviewed last pulmonology note on 04/21/2024.  Echo 10/02/2022:  IMPRESSIONS     1. LVH no clear evidence of LVOT obstruction.   2. Negative bubble study.   3. Left ventricular ejection fraction, by estimation, is 70 to 75%. The  left ventricle has hyperdynamic function. The left ventricle has  no  regional wall motion abnormalities. There is moderate concentric left  ventricular hypertrophy. Left ventricular  diastolic parameters are consistent with Grade I diastolic dysfunction  (impaired relaxation).   4. Right ventricular systolic function is normal. The right ventricular  size is normal.   5. The mitral valve is normal in structure. Trivial mitral valve  regurgitation.   6. The aortic valve is normal in structure. Aortic valve regurgitation is  not visualized.     FINAL CLINICAL IMPRESSION(S) / ED DIAGNOSES   Final diagnoses:  COPD exacerbation (HCC)  Acute respiratory failure with hypoxia (HCC)  Symptomatic anemia  AKI (acute kidney  injury) (HCC)     Rx / DC Orders   ED Discharge Orders     None        Note:  This document was prepared using Dragon voice recognition software and may include unintentional dictation errors.   Mickel Schreur, Clover Dao, DO 04/27/24 [phone]

## 2024-04-27 NOTE — Plan of Care (Signed)

## 2024-04-28 DIAGNOSIS — J441 Chronic obstructive pulmonary disease with (acute) exacerbation: Secondary | ICD-10-CM | POA: Diagnosis not present

## 2024-04-28 LAB — CBC
HCT: 27.6 % — ABNORMAL LOW (ref 36.0–46.0)
Hemoglobin: 8.3 g/dL — ABNORMAL LOW (ref 12.0–15.0)
MCH: 25.5 pg — ABNORMAL LOW (ref 26.0–34.0)
MCHC: 30.1 g/dL (ref 30.0–36.0)
MCV: 84.9 fL (ref 80.0–100.0)
Platelets: 228 10*3/uL (ref 150–400)
RBC: 3.25 MIL/uL — ABNORMAL LOW (ref 3.87–5.11)
RDW: 16.7 % — ABNORMAL HIGH (ref 11.5–15.5)
WBC: 9.4 10*3/uL (ref 4.0–10.5)
nRBC: 0.6 % — ABNORMAL HIGH (ref 0.0–0.2)

## 2024-04-28 LAB — BPAM RBC
Blood Product Expiration Date: 202506182359
ISSUE DATE / TIME: 202505250634
Unit Type and Rh: 5100

## 2024-04-28 LAB — BASIC METABOLIC PANEL WITH GFR
Anion gap: 9 (ref 5–15)
BUN: 22 mg/dL (ref 8–23)
CO2: 28 mmol/L (ref 22–32)
Calcium: 8.6 mg/dL — ABNORMAL LOW (ref 8.9–10.3)
Chloride: 102 mmol/L (ref 98–111)
Creatinine, Ser: 0.94 mg/dL (ref 0.44–1.00)
GFR, Estimated: >60 mL/min (ref >60)
Glucose, Bld: 88 mg/dL (ref 70–99)
Potassium: 3.8 mmol/L (ref 3.5–5.1)
Sodium: 139 mmol/L (ref 135–145)

## 2024-04-28 LAB — TYPE AND SCREEN
ABO/RH(D): O POS
Antibody Screen: NEGATIVE
Unit division: 0

## 2024-04-28 LAB — PHOSPHORUS: Phosphorus: 3.7 mg/dL (ref 2.5–4.6)

## 2024-04-28 LAB — MAGNESIUM: Magnesium: 2.3 mg/dL (ref 1.7–2.4)

## 2024-04-28 MED ORDER — CYANOCOBALAMIN 1000 MCG/ML IJ SOLN
1000.0000 ug | Freq: Every day | INTRAMUSCULAR | Status: DC
  Administered 2024-04-28 – 2024-04-30 (×3): 1000 ug via INTRAMUSCULAR
  Filled 2024-04-28 (×3): qty 1

## 2024-04-28 MED ORDER — VITAMIN B-12 1000 MCG PO TABS: 1000.0000 ug | ORAL_TABLET | Freq: Every day | ORAL | Status: DC

## 2024-04-28 MED ORDER — IRON SUCROSE 300 MG IVPB - SIMPLE MED
300.0000 mg | Freq: Once | Status: AC
  Administered 2024-04-28: 300 mg via INTRAVENOUS
  Filled 2024-04-28: qty 300

## 2024-04-28 MED ORDER — ENSURE ENLIVE PO LIQD
237.0000 mL | Freq: Two times a day (BID) | ORAL | Status: DC
  Administered 2024-04-28 (×2): 237 mL via ORAL

## 2024-04-28 MED ORDER — ALPRAZOLAM 0.25 MG PO TABS
0.2500 mg | ORAL_TABLET | Freq: Two times a day (BID) | ORAL | Status: DC | PRN
  Administered 2024-04-28: 0.25 mg via ORAL
  Filled 2024-04-28: qty 1

## 2024-04-28 MED ORDER — IPRATROPIUM-ALBUTEROL 0.5-2.5 (3) MG/3ML IN SOLN
3.0000 mL | Freq: Two times a day (BID) | RESPIRATORY_TRACT | Status: DC
  Administered 2024-04-29: 3 mL via RESPIRATORY_TRACT
  Filled 2024-04-28: qty 3

## 2024-04-28 NOTE — Progress Notes (Signed)
 [facility] Progress Note  Patient: Barbara Contreras    NAT:[id number]  DOA: 04/27/2024     Date of Service: the patient was seen and examined on 04/28/2024  Chief Complaint  Patient presents with   Shortness of Breath   Brief hospital course: [name] is a 73 y.o. 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  Patient states she has had experience of progressive shortness of breath of the last few days, however this morning in the early hours it was suddenly worse, she woke up from sleep unable to breathe could not catch her breath.  Denies any chest pain.  She gave herself a home DuoNeb without any relief.  On EMS arrival, she was saturating in the low 80s on room air, they gave her albuterol  treatment placed on oxygen.  She denies any recent significant cough, fevers, chills, chest pain, vomiting.  She does take Eliquis, denies any blood in her stool, urine, or any hematemesis.  She denies lower extremity edema, or orthopnea.  Recently her nephrologist discontinued her lisinopril /HCTZ due to worsening renal function.    Assessment and Plan:  # Acute hypoxic respiratory failure secondary to COPD exacerbation Negative COVID, flu and RSV Continue supplemental O2 admission and gradually wean off Status post Solu-Medrol , transition to prednisone 40 mg p.o. daily -Continue home Incruse Ellipta, Singulair, Flonase Continue DuoNeb every 6 hours   # Acute on chronic diastolic CHF exacerbation Elevated BNP 901 4/25 TTE at [facility] shows LVEF >55%, grade 1 diastolic dysfunction   # Elevated troponin most likely due to demand ischemia Presented with chest pressure and difficulty breathing No significant EKG changes Troponin peaked at 111 Currently  patient is chest pain-free Continue aspirin  81 mg. Cardiology following   # Atrial fibrillation, recent diagnosis for her -continue Toprol -XL, and Eliquis Cardiology following, patient may need cardioversion Continue to monitor on telemetry  # Hypertension Continue amlodipine  5 mg and Toprol -XL 25 mg p.o. daily Monitor BP and titrate medications accordingly  Acute on CKD 2-followed as an outpatient by nephrology, noted to have fluctuating renal function.  Her lisinopril Ilona Malta has been held since April. Creatinine 1.83 on admission sCr 0.94 improved Monitor renal functions and urine output Daily   # Vitamin B12 deficiency: B12 level 192, goal >400.  Started vitamin B12 1000 mcg IM injection daily during hospital stay, followed by oral supplement.  Follow-up PCP to repeat vitamin B12 level after 3 to 6 months.  # Iron deficiency anemia, transferrin saturation 4%, s/p Venofer 200 mg 1 dose and Venofer 3 mg x 1 dose given during hospital stay.  Start oral iron supplement with vitamin C on discharge Hb 7.4, patient received 1 unit of PRBC transfusion on 5/25, postoperative hemoglobin 8.3 No evidence of bleeding, safe to continue Eliquis  Continue to monitor H&H   Peripheral neuropathy-gabapentin , at reduced dose due to acute kidney injury   Body mass index is 25.56 kg/m.  Interventions:  Diet: 2 g sodium diet DVT Prophylaxis: Therapeutic Anticoagulation with Eliquis   Advance goals of care discussion: Full code  Family Communication: family was not present at bedside, at the time of interview.  The pt provided permission to discuss medical plan with the family. Opportunity was given to ask question and all questions were answered satisfactorily.   Disposition:  Pt is  from Home, admitted with Resp failure, still has sob and on O2 inh, which precludes a safe discharge. Discharge to Home, when stable and cleared by cardiology.  May need few days to improve.  Subjective: No  significant events overnight, patient's breathing is getting better so on supplemental O2 inhalation.  Denies any chest pressure now, no palpitations.  Denied any other complaints  Physical Exam: General: NAD, lying comfortably Appear in no distress, affect appropriate Eyes: PERRLA ENT: Oral Mucosa Clear, moist  Neck: no JVD,  Cardiovascular: S1 and S2 Present, no Murmur,  Respiratory: good respiratory effort, Bilateral Air entry equal and Decreased, no Crackles, no wheezes Abdomen: Bowel Sound present, Soft and no tenderness,  Skin: no rashes Extremities: no Pedal edema, no calf tenderness Neurologic: without any new focal findings Gait not checked due to patient safety concerns  Vitals:   04/28/24 0809 04/28/24 1008 04/28/24 1223 04/28/24 1443  BP: (!) 130/59   (!) 112/95  Pulse: (!) 57 68  (!) 56  Resp: 17     Temp: 98.3 F (36.8 C)   98 F (36.7 C)  TempSrc: Oral     SpO2: 97%  93% 94%  Weight:      Height:        Intake/Output Summary (Last 24 hours) at 04/28/2024 1532 Last data filed at 04/28/2024 0900 Gross per 24 hour  Intake 600 ml  Output --  Net 600 ml   Filed Weights   04/27/24 0420  Weight: 70.8 kg    Data Reviewed: I have personally reviewed and interpreted daily labs, tele strips, imagings as discussed above. I reviewed all nursing notes, pharmacy notes, vitals, pertinent old records I have discussed plan of care as described above with RN and patient/family.  CBC: Recent Labs  Lab 04/27/24 0425 04/27/24 1028 04/28/24 0443  WBC 8.8 9.5 9.4  HGB 7.4* 9.0* 8.3*  HCT 25.8* 29.9* 27.6*  MCV 84.9 84.7 84.9  PLT 236 235 228   Basic Metabolic Panel: Recent Labs  Lab 04/27/24 0425 04/28/24 0443  NA 141 139  K 4.3 3.8  CL 105 102  CO2 26 28  GLUCOSE 126* 88  BUN 24* 22  CREATININE 1.83* 0.94  CALCIUM  8.4* 8.6*  MG  --  2.3  PHOS  --  3.7    Studies: No results found.  Scheduled Meds:  amLODipine   5 mg Oral Daily   anastrozole   1 mg  Oral Daily   apixaban  5 mg Oral BID   aspirin  EC  81 mg Oral Daily   cyanocobalamin   1,000 mcg Intramuscular Q1200   Followed by   Cecily Cohen ON 05/05/2024] vitamin B-12  1,000 mcg Oral Daily   dicyclomine   10 mg Oral BID   docusate sodium   100 mg Oral BID   ezetimibe  10 mg Oral Daily   feeding supplement  237 mL Oral BID BM   fluticasone  1 spray Each Nare Daily   gabapentin   300 mg Oral TID   [START ON 04/29/2024] ipratropium-albuterol   3 mL Nebulization BID   metoprolol  succinate  25 mg Oral Daily   montelukast  10 mg Oral QHS   pantoprazole   40 mg Oral Daily   predniSONE  40 mg Oral Q breakfast   umeclidinium bromide  1 puff Inhalation Daily   venlafaxine  XR  75 mg Oral TID   Continuous Infusions:  sodium chloride      PRN Meds: acetaminophen  **OR** acetaminophen , albuterol , ondansetron  **OR** ondansetron  (ZOFRAN ) IV, traMADol ,  traZODone   Time spent: 55 minutes  Author: Althia Atlas. MD [facility] 04/28/2024 3:32 PM  To reach On-call, see care teams to locate the attending and reach out to them via [email or web address]. If 7PM-7AM, please contact night-coverage If you still have difficulty reaching the attending provider, please page the [facility] (Director on Call) for [facility] on amion for assistance.

## 2024-04-28 NOTE — Care Management Obs Status (Signed)
 MEDICARE OBSERVATION STATUS NOTIFICATION   Patient Details  Name: Barbara Contreras MRN: [id number] Date of Birth: [birth year]   Medicare Observation Status Notification Given:  Yes    Anise Kerns 04/28/2024, 11:29 AM

## 2024-04-28 NOTE — Progress Notes (Signed)
 Patient ID: Barbara Contreras, female   DOB: [birth year], 73 y.o.   MRN: [id number] [facility] Cardiology    SUBJECTIVE: States he feels somewhat better breathing is better not as short of breath no cough no significant lower extremity edema still wearing oxygen some improvement with inhalers and breathing treatment   Vitals:   04/28/24 0447 04/28/24 0809 04/28/24 1008 04/28/24 1223  BP: (!) 127/52 (!) 130/59    Pulse: 66 (!) 57 68   Resp: 20 17    Temp: 98.1 F (36.7 C) 98.3 F (36.8 C)    TempSrc: Oral Oral    SpO2: 100% 97%  93%  Weight:      Height:         Intake/Output Summary (Last 24 hours) at 04/28/2024 1343 Last data filed at 04/28/2024 0900 Gross per 24 hour  Intake 600 ml  Output --  Net 600 ml      PHYSICAL EXAM  General: Well developed, well nourished, in no acute distress HEENT:  Normocephalic and atramatic Neck:  No JVD.  Lungs: Diffuse rhonchi bilaterally to auscultation and percussion. Heart: HRRR . Normal S1 and S2 without gallops or 3/6 sem murmurs.  Abdomen: Bowel sounds are positive, abdomen soft and non-tender  Msk:  Back normal, normal gait. Normal strength and tone for age. Extremities: No clubbing, cyanosis or edema.   Neuro: Alert and oriented X 3. Psych:  Good affect, responds appropriately   LABS: Basic Metabolic Panel: Recent Labs    04/27/24 0425 04/28/24 0443  NA 141 139  K 4.3 3.8  CL 105 102  CO2 26 28  GLUCOSE 126* 88  BUN 24* 22  CREATININE 1.83* 0.94  CALCIUM  8.4* 8.6*  MG  --  2.3  PHOS  --  3.7   Liver Function Tests: No results for input(s): "AST", "ALT", "ALKPHOS", "BILITOT", "PROT", "ALBUMIN" in the last 72 hours. No results for input(s): "LIPASE", "AMYLASE" in the last 72 hours. CBC: Recent Labs    04/27/24 1028 04/28/24 0443  WBC 9.5 9.4  HGB 9.0* 8.3*  HCT 29.9* 27.6*  MCV 84.7 84.9  PLT 235 228   Cardiac Enzymes: No results for input(s): "CKTOTAL", "CKMB", "CKMBINDEX", "TROPONINI" in the last 72  hours. BNP: Invalid input(s): "POCBNP" D-Dimer: No results for input(s): "DDIMER" in the last 72 hours. Hemoglobin A1C: No results for input(s): "HGBA1C" in the last 72 hours. Fasting Lipid Panel: No results for input(s): "CHOL", "HDL", "LDLCALC", "TRIG", "CHOLHDL", "LDLDIRECT" in the last 72 hours. Thyroid Function Tests: No results for input(s): "TSH", "T4TOTAL", "T3FREE", "THYROIDAB" in the last 72 hours.  Invalid input(s): "FREET3" Anemia Panel: Recent Labs    04/27/24 0504  VITAMINB12 192  FOLATE 20.3  FERRITIN 6*  TIBC 538*  IRON 19*  RETICCTPCT 3.6*    DG Chest Port 1 View Result Date: 04/27/2024 CLINICAL DATA:  Dyspnea EXAM: PORTABLE CHEST 1 VIEW COMPARISON:  01/29/2023 FINDINGS: Cardiomegaly and interstitial opacity with hazy opacity at the bases greater on the right. No pneumothorax. Artifact from EKG leads. IMPRESSION: Hazy opacification in the lower lungs similar to 01/29/2023, infection or failure could have this appearance. Electronically Signed   By: Ronnette Coke M.D.   On: 04/27/2024 05:27     Echo consistent with hypertrophic obstructive cardiomyopathy  TELEMETRY: Normal sinus rhythm rate of 65 LVH nonspecific ST-T changes  ASSESSMENT AND PLAN:  Principal Problem:   COPD exacerbation (HCC) Hypertension Hyperlipidemia Coronary artery disease Hypertrophic obstructive cardiomyopathy Atrial fibrillation Hypoxemia Asthma Murmur Chronic renal  sufficiency stage III . Plan COPD exacerbation continue inhalers as necessary supplemental oxygen Consider pulmonary input for what appears to be moderate severe COPD Currently on supplemental Solu-Medrol  possibly antibiotics inhalers and supplemental oxygen Diastolic congestive heart failure elevated BNP x-ray findings continue diuretic therapy Hypertension reasonably managed consider beta-blocker consider Imdur hydralazine  with try to avoid calcium  blockers because of heart failure Atrial fibrillation  continue Toprol  Eliquis consider rhythm control with possible cardioversion Chronic renal insufficiency avoid nephrotoxic drugs consider nephrology input     Antonette Batters, MD 04/28/2024 1:43 PM

## 2024-04-28 NOTE — Progress Notes (Signed)
 Mobility Specialist - Progress Note (3L) Post-mobility: HR 61, SPO2 96%   04/28/24 1220  Mobility  Activity Ambulated with assistance in room  Level of Assistance Standby assist, set-up cues, supervision of patient - no hands on  Assistive Device None  Distance Ambulated (ft) 24 ft  Activity Response Tolerated well  Mobility visit 1 Mobility  Mobility Specialist Start Time (ACUTE ONLY) 1203  Mobility Specialist Stop Time (ACUTE ONLY) 1211  Mobility Specialist Time Calculation (min) (ACUTE ONLY) 8 min   Pt semi fowler upon entry, utilizing 4L [place]-- O2 >90%. Pt agreeable to limited OOB activity this date. Pt completed bed mob ModI, STS and amb to the door within the room x2 with MinG-SBA-- no hands on assist required. Pt voiced excitement to experience no dizziness when standing or during amb. Pt returned to bed, left semi fowler with needs within reach. RN weaned Pt to 3L [place], O2 >90% at rest. RN present at bedside.  Versa Gore Mobility Specialist 04/28/24 12:24 PM

## 2024-04-28 NOTE — Progress Notes (Signed)
       CROSS COVER NOTE  NAME: Barbara Contreras MRN: [id number] DOB : Oct 11, 1951    Concern as stated by nurse / staff   this patient is here with new copd exacerbation. She feels like she is having a hard time breathing like she's sufficating but I believe its just because she isn't used to the nasal cannula being there and it's making her anxious. Her sats just a few minutes ago were 97% on 3L. She is talking well and breathing unlabored. Does take effexor  at home but states nothing else for axiety  JS Can we get her something PRN for anxiety?      Pertinent findings on chart review: Last progress note from Dr. Hubert Madden reviewed  Patient Assessment   Assessment and  Interventions   Assessment:  Anxiety COPD exacerbation with hypoxia stable  Plan: Xanax as needed Monitor for worsening of respiratory status X

## 2024-04-29 ENCOUNTER — Observation Stay
Admit: 2024-04-29 | Discharge: 2024-04-29 | Disposition: A | Discharge status: Home / Self Care | Attending: Student | Admitting: Student

## 2024-04-29 DIAGNOSIS — R079 Chest pain, unspecified: Secondary | ICD-10-CM | POA: Diagnosis not present

## 2024-04-29 DIAGNOSIS — J441 Chronic obstructive pulmonary disease with (acute) exacerbation: Secondary | ICD-10-CM | POA: Diagnosis not present

## 2024-04-29 LAB — PHOSPHORUS: Phosphorus: 3.8 mg/dL (ref 2.5–4.6)

## 2024-04-29 LAB — BASIC METABOLIC PANEL WITH GFR
Anion gap: 7 (ref 5–15)
BUN: 24 mg/dL — ABNORMAL HIGH (ref 8–23)
CO2: 29 mmol/L (ref 22–32)
Calcium: 9.3 mg/dL (ref 8.9–10.3)
Chloride: 105 mmol/L (ref 98–111)
Creatinine, Ser: 0.77 mg/dL (ref 0.44–1.00)
GFR, Estimated: >60 mL/min (ref >60)
Glucose, Bld: 106 mg/dL — ABNORMAL HIGH (ref 70–99)
Potassium: 4.1 mmol/L (ref 3.5–5.1)
Sodium: 141 mmol/L (ref 135–145)

## 2024-04-29 LAB — CBC
HCT: 29.8 % — ABNORMAL LOW (ref 36.0–46.0)
Hemoglobin: 8.8 g/dL — ABNORMAL LOW (ref 12.0–15.0)
MCH: 25.2 pg — ABNORMAL LOW (ref 26.0–34.0)
MCHC: 29.5 g/dL — ABNORMAL LOW (ref 30.0–36.0)
MCV: 85.4 fL (ref 80.0–100.0)
Platelets: 228 10*3/uL (ref 150–400)
RBC: 3.49 MIL/uL — ABNORMAL LOW (ref 3.87–5.11)
RDW: 17.2 % — ABNORMAL HIGH (ref 11.5–15.5)
WBC: 13.2 10*3/uL — ABNORMAL HIGH (ref 4.0–10.5)
nRBC: 1.4 % — ABNORMAL HIGH (ref 0.0–0.2)

## 2024-04-29 LAB — MAGNESIUM: Magnesium: 2.3 mg/dL (ref 1.7–2.4)

## 2024-04-29 MED ORDER — FUROSEMIDE 10 MG/ML IJ SOLN
40.0000 mg | Freq: Every day | INTRAMUSCULAR | Status: DC
  Administered 2024-04-29: 40 mg via INTRAVENOUS
  Filled 2024-04-29: qty 4

## 2024-04-29 MED ORDER — BISACODYL 5 MG PO TBEC
10.0000 mg | DELAYED_RELEASE_TABLET | Freq: Every day | ORAL | Status: DC
  Filled 2024-04-29: qty 2

## 2024-04-29 MED ORDER — BISACODYL 5 MG PO TBEC
10.0000 mg | DELAYED_RELEASE_TABLET | Freq: Once | ORAL | Status: AC
  Administered 2024-04-29: 10 mg via ORAL
  Filled 2024-04-29: qty 2

## 2024-04-29 MED ORDER — POLYETHYLENE GLYCOL 3350 17 G PO PACK
17.0000 g | PACK | Freq: Two times a day (BID) | ORAL | Status: DC
  Administered 2024-04-29 – 2024-04-30 (×2): 17 g via ORAL
  Filled 2024-04-29 (×3): qty 1

## 2024-04-29 MED ORDER — BISACODYL 10 MG RE SUPP: 10.0000 mg | Freq: Every day | RECTAL | Status: DC | PRN

## 2024-04-29 NOTE — Progress Notes (Signed)
 [facility] CLINIC CARDIOLOGY PROGRESS NOTE   Patient ID: Barbara Contreras MRN: [id number] DOB/AGE: [birth year] 73 y.o.  Admit date: 04/27/2024 Referring Physician Dr. Pincus Bridgeman  Primary Physician Olmedo, Dianna Fortis, MD Primary Cardiologist Dr. Bob Burn Reason for Consultation Shortness of breath  HPI: Barbara Contreras is a 73 y.o. female with a past medical history of coronary artery disease, HTN, HLD, HOCM, HFpEF, paroxsymal atrial fibrillation (on Eliquis), COPD (no home O2 requirement) who presented to the ED on 04/27/2024 for shortness of breath. Patient found to be in hypertensive urgency, hypoxic respiratory failure likely due to pulmonary edema vs acute COPD exacerbation. Cardiology consulted for further evaluation.   Interval History: -Patient seen and examined this AM and laying comfortably in hospital bed. Patient states she feels her SOB is improving and denies any chest pain or palpitations.  -Patients BP elevated and HR  stable this AM. Not on tele. -No UOP documented -Patient remains on 2L with stable SpO2. No home O2 requirement.  Review of systems complete and found to be negative unless listed above    Vitals:   04/29/24 0130 04/29/24 0526 04/29/24 0716 04/29/24 0818  BP:  (!) 154/73  (!) 154/63  Pulse:  79  (!) 58  Resp:  17  17  Temp:  (!) 97.2 F (36.2 C)  (!) 97.3 F (36.3 C)  TempSrc:      SpO2: 96% 92% 94% 96%  Weight:      Height:         Intake/Output Summary (Last 24 hours) at 04/29/2024 1142 Last data filed at 04/29/2024 1024 Gross per 24 hour  Intake 120 ml  Output --  Net 120 ml     PHYSICAL EXAM General: well appearing elderly female, well nourished, in no acute distress. HEENT: Normocephalic and atraumatic. Neck: No JVD.  Lungs: Normal respiratory effort on 2L. Diminished bilaterally. Heart: HRRR. Normal S1 and S2 without gallops or murmurs. Radial & DP pulses 2+ bilaterally. Abdomen: Non-distended appearing.  Msk: Normal strength  and tone for age. Extremities: No clubbing, cyanosis or edema.   Neuro: Alert and oriented X 3. Psych: Mood appropriate, affect congruent.    LABS: Basic Metabolic Panel: Recent Labs    04/28/24 0443 04/29/24 0457  NA 139 141  K 3.8 4.1  CL 102 105  CO2 28 29  GLUCOSE 88 106*  BUN 22 24*  CREATININE 0.94 0.77  CALCIUM  8.6* 9.3  MG 2.3 2.3  PHOS 3.7 3.8   Liver Function Tests: No results for input(s): "AST", "ALT", "ALKPHOS", "BILITOT", "PROT", "ALBUMIN" in the last 72 hours. No results for input(s): "LIPASE", "AMYLASE" in the last 72 hours. CBC: Recent Labs    04/28/24 0443 04/29/24 0457  WBC 9.4 13.2*  HGB 8.3* 8.8*  HCT 27.6* 29.8*  MCV 84.9 85.4  PLT 228 228   Cardiac Enzymes: Recent Labs    04/27/24 0425 04/27/24 0710 04/27/24 1028  TROPONINIHS 28* 85* 111*   BNP: Recent Labs    04/27/24 0425  BNP 901.6*   D-Dimer: No results for input(s): "DDIMER" in the last 72 hours. Hemoglobin A1C: No results for input(s): "HGBA1C" in the last 72 hours. Fasting Lipid Panel: No results for input(s): "CHOL", "HDL", "LDLCALC", "TRIG", "CHOLHDL", "LDLDIRECT" in the last 72 hours. Thyroid Function Tests: No results for input(s): "TSH", "T4TOTAL", "T3FREE", "THYROIDAB" in the last 72 hours.  Invalid input(s): "FREET3" Anemia Panel: Recent Labs    04/27/24 0504  VITAMINB12 192  FOLATE 20.3  FERRITIN 6*  TIBC 538*  IRON 19*  RETICCTPCT 3.6*    No results found.   ECHO ordered  TELEMETRY reviewed by me 04/29/24: not on tele  EKG reviewed by me 04/29/24: sinus rhythm, rate 64 bpm  DATA reviewed by me 04/29/24: last 24h vitals tele labs imaging I/O hospitalist progress notes.  Principal Problem:   COPD exacerbation (HCC)    ASSESSMENT AND PLAN: Barbara Contreras is a 73 y.o. female with a past medical history of coronary artery disease, HTN, HLD, HOCM, HFpEF, paroxsymal atrial fibrillation (on Eliquis), COPD (no home O2 requirement) who presented  to the ED on 04/27/2024 for shortness of breath. Patient found to be in hypertensive urgency, hypoxic respiratory failure likely due to pulmonary edema vs acute COPD exacerbation. Cardiology consulted for further evaluation.   # COPD exacerbation # Hypertensive urgency # Acute on chronic HFpEF # Paroxsymal atrial fibrillation # Hypertension # Demand ischemia Patient presents to ED with shortness of breath. BNP elevated at 900. EKG without acute ischemic changes. Trops elevated and trended 20 > 80 > 110. Echo from 03/2024 with pEF and grade I diastolic dysfunction.  -Echo ordered. -Ordered IV lasix  40 mg daily for 2 doses. Closely monitor UOP and renal function. -Continue amlodipine  5 mg daily, metoprolol  succinate 25 mg daily. -Continue Eliquis for stroke risk reduction. -Continue ASA, Zetia (patient takes Repathta at home every 2 weeks.) -Elevated trops in setting of acute hypoxic respiratory failure with COPD and HF exacerbation likely mismatch demand/supply ischemia.  -COPD management per primary team.   This patient's case was discussed and created with Dr. Custovic and she is in agreement.  Signed:  Creighton Doffing, PA-C  04/29/2024, 11:42 AM [facility] Cardiology

## 2024-04-29 NOTE — Progress Notes (Signed)
 [facility] Progress Note  Patient: Barbara Contreras    ZOX:[id number]  DOA: 04/27/2024     Date of Service: the patient was seen and examined on 04/29/2024  Chief Complaint  Patient presents with   Shortness of Breath   Brief hospital course: Barbara Contreras is a 73 y.o. 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  Patient states she has had experience of progressive shortness of breath of the last few days, however this morning in the early hours it was suddenly worse, she woke up from sleep unable to breathe could not catch her breath.  Denies any chest pain.  She gave herself a home DuoNeb without any relief.  On EMS arrival, she was saturating in the low 80s on room air, they gave her albuterol  treatment placed on oxygen.  She denies any recent significant cough, fevers, chills, chest pain, vomiting.  She does take Eliquis, denies any blood in her stool, urine, or any hematemesis.  She denies lower extremity edema, or orthopnea.  Recently her nephrologist discontinued her lisinopril /HCTZ due to worsening renal function.    Assessment and Plan:  # Acute hypoxic respiratory failure secondary to COPD exacerbation Negative COVID, flu and RSV Continue supplemental O2 admission and gradually wean off Status post Solu-Medrol , transition to prednisone 40 mg p.o. daily -Continue home Incruse Ellipta, Singulair, Flonase Continue DuoNeb every 6 hours 5/27 still patient needs supplemental O2 in addition, RN was advised to gradually wean off or qualify her for home oxygen.  Also check ambulatory O2 sats before discharge  # Acute on chronic diastolic CHF exacerbation Elevated BNP 901 4/25 TTE at [facility] shows LVEF >55%, grade 1 diastolic dysfunction 5/27  started Lasix  40 mg IV daily x 2 doses ordered by cardiology  # Elevated troponin most likely due to demand ischemia Presented with chest pressure and difficulty breathing No significant EKG changes Troponin peaked at 111 Currently patient is chest pain-free Continue aspirin  81 mg. Cardiology following   # Atrial fibrillation, recent diagnosis for her -continue Toprol -XL, and Eliquis Cardiology following, patient may need cardioversion Continue to monitor on telemetry  # Hypertension Continue amlodipine  5 mg and Toprol -XL 25 mg p.o. daily Monitor BP and titrate medications accordingly  Acute on CKD 2-followed as an outpatient by nephrology, noted to have fluctuating renal function.  Her lisinopril Ilona Malta has been held since April. Creatinine 1.83 on admission sCr 0.77 improved Monitor renal functions and urine output Daily   # Vitamin B12 deficiency: B12 level 192, goal >400.  Started vitamin B12 1000 mcg IM injection daily during hospital stay, followed by oral supplement.  Follow-up PCP to repeat vitamin B12 level after 3 to 6 months.  # Iron deficiency anemia, transferrin saturation 4%, s/p Venofer 200 mg 1 dose and Venofer 3 mg x 1 dose given during hospital stay.  Start oral iron supplement with vitamin C on discharge Hb 7.4, patient received 1 unit of PRBC transfusion on 5/25, postoperative hemoglobin 8.8 No evidence of bleeding, safe to continue Eliquis  Continue to monitor H&H   # Peripheral neuropathy-gabapentin , at reduced dose due to acute kidney injury   # Constipation, started laxatives.   Body mass index is 25.56 kg/m.  Interventions:  Diet: 2 g sodium diet DVT Prophylaxis: Therapeutic Anticoagulation with Eliquis   Advance goals of care  discussion: Full code  Family Communication: family was not present at bedside, at the time of interview.  The pt provided permission to discuss medical plan with the family. Opportunity was given to ask question and all  questions were answered satisfactorily.   Disposition:  Pt is from Home, admitted with Resp failure, still has sob and on O2 inh, which precludes a safe discharge. Discharge to Home, when stable and cleared by cardiology, most likely tomorrow a.m. if remains stable and on room air.  Subjective: Overnight patient had an episode of difficulty breathing, most likely due to anxiety.  In the morning time patient was feeling fine, no any acute respiratory distress, no chest pain or palpitations.  Patient was concerned about bowel movement, has not moved bowels for the past 4 days.  This is pretty unusual for her.  We will start laxatives as above.     Physical Exam: General: NAD, lying comfortably Appear in no distress, affect appropriate Eyes: PERRLA ENT: Oral Mucosa Clear, moist  Neck: no JVD,  Cardiovascular: S1 and S2 Present, no Murmur,  Respiratory: good respiratory effort, Bilateral Air entry equal and Decreased, no Crackles, no wheezes Abdomen: Bowel Sound present, Soft and no tenderness,  Skin: no rashes Extremities: no Pedal edema, no calf tenderness Neurologic: without any new focal findings Gait not checked due to patient safety concerns  Vitals:   04/29/24 0526 04/29/24 0716 04/29/24 0818 04/29/24 1445  BP: (!) 154/73  (!) 154/63 (!) 131/53  Pulse: 79  (!) 58 60  Resp: 17  17 16   Temp: (!) 97.2 F (36.2 C)  (!) 97.3 F (36.3 C) 98.5 F (36.9 C)  TempSrc:    Oral  SpO2: 92% 94% 96% 97%  Weight:      Height:        Intake/Output Summary (Last 24 hours) at 04/29/2024 1609 Last data filed at 04/29/2024 1024 Gross per 24 hour  Intake 120 ml  Output --  Net 120 ml   Filed Weights   04/27/24 0420  Weight: 70.8 kg    Data Reviewed: I have personally reviewed and interpreted daily labs, tele strips, imagings as discussed above. I reviewed all nursing notes, pharmacy notes, vitals, pertinent old records I have discussed plan of care as described above with RN and  patient/family.  CBC: Recent Labs  Lab 04/27/24 0425 04/27/24 1028 04/28/24 0443 04/29/24 0457  WBC 8.8 9.5 9.4 13.2*  HGB 7.4* 9.0* 8.3* 8.8*  HCT 25.8* 29.9* 27.6* 29.8*  MCV 84.9 84.7 84.9 85.4  PLT 236 235 228 228   Basic Metabolic Panel: Recent Labs  Lab 04/27/24 0425 04/28/24 0443 04/29/24 0457  NA 141 139 141  K 4.3 3.8 4.1  CL 105 102 105  CO2 26 28 29   GLUCOSE 126* 88 106*  BUN 24* 22 24*  CREATININE 1.83* 0.94 0.77  CALCIUM  8.4* 8.6* 9.3  MG  --  2.3 2.3  PHOS  --  3.7 3.8    Studies: No results found.  Scheduled Meds:  amLODipine   5 mg Oral Daily   anastrozole   1 mg Oral Daily   apixaban  5 mg Oral BID   aspirin  EC  81 mg Oral Daily   bisacodyl  10 mg Oral QHS   cyanocobalamin   1,000 mcg Intramuscular Q1200   Followed by   Cecily Cohen ON 05/05/2024] vitamin B-12  1,000 mcg Oral Daily   dicyclomine   10 mg Oral BID   docusate sodium   100 mg Oral BID  ezetimibe  10 mg Oral Daily   feeding supplement  237 mL Oral BID BM   fluticasone  1 spray Each Nare Daily   furosemide   40 mg Intravenous Daily   gabapentin   300 mg Oral TID   metoprolol  succinate  25 mg Oral Daily   montelukast  10 mg Oral QHS   pantoprazole   40 mg Oral Daily   polyethylene glycol  17 g Oral BID   predniSONE  40 mg Oral Q breakfast   umeclidinium bromide  1 puff Inhalation Daily   venlafaxine  XR  75 mg Oral TID   Continuous Infusions:  sodium chloride      PRN Meds: acetaminophen  **OR** acetaminophen , albuterol , ALPRAZolam, bisacodyl, ondansetron  **OR** ondansetron  (ZOFRAN ) IV, traMADol , traZODone   Time spent: 40 minutes  Author: Althia Atlas. MD [facility] 04/29/2024 4:09 PM  To reach On-call, see care teams to locate the attending and reach out to them via [email or web address]. If 7PM-7AM, please contact night-coverage If you still have difficulty reaching the attending provider, please page the [facility] (Director on Call) for [facility] on amion for assistance.

## 2024-04-29 NOTE — Plan of Care (Signed)

## 2024-04-29 NOTE — Progress Notes (Signed)
 SATURATION QUALIFICATIONS: (This note is used to comply with regulatory documentation for home oxygen)  Patient Saturations on Room Air at Rest = 88%  Patient Saturations on Room Air while Ambulating = did not attempt  Patient Saturations on 2 Liters of oxygen while Ambulating = 93%  Please briefly explain why patient needs home oxygen: patient dropped down to 88% at rest when oxygen was removed. She required 2 liters to maintain 93% while ambulating

## 2024-04-30 ENCOUNTER — Other Ambulatory Visit: Payer: Self-pay

## 2024-04-30 ENCOUNTER — Encounter: Payer: Self-pay | Admitting: Internal Medicine

## 2024-04-30 DIAGNOSIS — J441 Chronic obstructive pulmonary disease with (acute) exacerbation: Secondary | ICD-10-CM | POA: Diagnosis not present

## 2024-04-30 LAB — ECHOCARDIOGRAM COMPLETE
AR max vel: 2.3 cm2
AV Area VTI: 2.25 cm2
AV Area mean vel: 2.28 cm2
AV Mean grad: 6.3 mmHg
AV Peak grad: 11.7 mmHg
Ao pk vel: 1.71 m/s
Area-P 1/2: 2.59 cm2
Height: 65.5 in
S' Lateral: 2.8 cm
Weight: 2496 [oz_av]

## 2024-04-30 LAB — CBC
HCT: 30 % — ABNORMAL LOW (ref 36.0–46.0)
Hemoglobin: 8.8 g/dL — ABNORMAL LOW (ref 12.0–15.0)
MCH: 25.3 pg — ABNORMAL LOW (ref 26.0–34.0)
MCHC: 29.3 g/dL — ABNORMAL LOW (ref 30.0–36.0)
MCV: 86.2 fL (ref 80.0–100.0)
Platelets: 244 10*3/uL (ref 150–400)
RBC: 3.48 MIL/uL — ABNORMAL LOW (ref 3.87–5.11)
RDW: 17.6 % — ABNORMAL HIGH (ref 11.5–15.5)
WBC: 11.2 10*3/uL — ABNORMAL HIGH (ref 4.0–10.5)
nRBC: 1.3 % — ABNORMAL HIGH (ref 0.0–0.2)

## 2024-04-30 LAB — BASIC METABOLIC PANEL WITH GFR
Anion gap: 8 (ref 5–15)
BUN: 24 mg/dL — ABNORMAL HIGH (ref 8–23)
CO2: 33 mmol/L — ABNORMAL HIGH (ref 22–32)
Calcium: 8.8 mg/dL — ABNORMAL LOW (ref 8.9–10.3)
Chloride: 103 mmol/L (ref 98–111)
Creatinine, Ser: 0.76 mg/dL (ref 0.44–1.00)
GFR, Estimated: >60 mL/min (ref >60)
Glucose, Bld: 80 mg/dL (ref 70–99)
Potassium: 3.7 mmol/L (ref 3.5–5.1)
Sodium: 144 mmol/L (ref 135–145)

## 2024-04-30 LAB — PHOSPHORUS: Phosphorus: 3.7 mg/dL (ref 2.5–4.6)

## 2024-04-30 LAB — MAGNESIUM: Magnesium: 2.2 mg/dL (ref 1.7–2.4)

## 2024-04-30 MED ORDER — AMLODIPINE BESYLATE 10 MG PO TABS
10.0000 mg | ORAL_TABLET | Freq: Every day | ORAL | Status: DC
  Administered 2024-04-30: 10 mg via ORAL
  Filled 2024-04-30: qty 1

## 2024-04-30 MED ORDER — ASCORBIC ACID 500 MG PO TABS
500.0000 mg | ORAL_TABLET | Freq: Every day | ORAL | 2 refills | Status: AC
  Filled 2024-04-30: qty 30, 30d supply, fill #0

## 2024-04-30 MED ORDER — PANTOPRAZOLE SODIUM 40 MG PO TBEC
40.0000 mg | DELAYED_RELEASE_TABLET | Freq: Every evening | ORAL | 2 refills | Status: AC | PRN
  Filled 2024-04-30: qty 30, 30d supply, fill #0

## 2024-04-30 MED ORDER — POLYSACCHARIDE IRON COMPLEX 150 MG PO CAPS
150.0000 mg | ORAL_CAPSULE | Freq: Every day | ORAL | 0 refills | Status: AC
  Filled 2024-04-30: qty 30, 30d supply, fill #0

## 2024-04-30 MED ORDER — AMLODIPINE BESYLATE 10 MG PO TABS
10.0000 mg | ORAL_TABLET | Freq: Every day | ORAL | 11 refills | Status: AC
  Filled 2024-04-30: qty 30, 30d supply, fill #0

## 2024-04-30 NOTE — Progress Notes (Signed)
 [facility] CLINIC CARDIOLOGY PROGRESS NOTE   Patient ID: Barbara Contreras MRN: [id number] DOB/AGE: December 18, 1950 73 y.o.  Admit date: 04/27/2024 Referring Physician Dr. Pincus Bridgeman  Primary Physician Olmedo, Dianna Fortis, MD Primary Cardiologist Dr. Bob Burn Reason for Consultation Shortness of breath  HPI: Barbara Contreras is a 73 y.o. female with a past medical history of coronary artery disease, HTN, HLD, HOCM, HFpEF, paroxsymal atrial fibrillation (on Eliquis), COPD (no home O2 requirement) who presented to the ED on 04/27/2024 for shortness of breath. Patient found to be in hypertensive urgency, hypoxic respiratory failure likely due to pulmonary edema vs acute COPD exacerbation. Cardiology consulted for further evaluation.   Interval History: -Patient seen and examined this AM and laying comfortably in hospital bed, completely flat. Patient states she feels her SOB is much improved and denies any chest pain or palpitations.  -Patients BP elevated and HR stable this AM. Not on tele. -No UOP documented. Patient reports good UOP yesterday. -Patient remains on room air with stable SpO2.   Review of systems complete and found to be negative unless listed above    Vitals:   04/29/24 1445 04/29/24 1956 04/30/24 0317 04/30/24 0751  BP: (!) 131/53 (!) 147/66 (!) 158/69 (!) 162/84  Pulse: 60 68 67 73  Resp: 16 20 20 16   Temp: 98.5 F (36.9 C) 98.1 F (36.7 C) 98 F (36.7 C) 98.8 F (37.1 C)  TempSrc: Oral Oral    SpO2: 97% 97% 96% 98%  Weight:      Height:         Intake/Output Summary (Last 24 hours) at 04/30/2024 4098 Last data filed at 04/29/2024 1024 Gross per 24 hour  Intake 120 ml  Output --  Net 120 ml     PHYSICAL EXAM General: well appearing elderly female, well nourished, in no acute distress. HEENT: Normocephalic and atraumatic. Neck: No JVD.  Lungs: Normal respiratory effort on 2L. CTAB Heart: HRRR. Normal S1 and S2 without gallops or murmurs. Radial & DP  pulses 2+ bilaterally. Abdomen: Non-distended appearing.  Msk: Normal strength and tone for age. Extremities: No clubbing, cyanosis or edema.   Neuro: Alert and oriented X 3. Psych: Mood appropriate, affect congruent.    LABS: Basic Metabolic Panel: Recent Labs    04/29/24 0457 04/30/24 0510  NA 141 144  K 4.1 3.7  CL 105 103  CO2 29 33*  GLUCOSE 106* 80  BUN 24* 24*  CREATININE 0.77 0.76  CALCIUM  9.3 8.8*  MG 2.3 2.2  PHOS 3.8 3.7   Liver Function Tests: No results for input(s): "AST", "ALT", "ALKPHOS", "BILITOT", "PROT", "ALBUMIN" in the last 72 hours. No results for input(s): "LIPASE", "AMYLASE" in the last 72 hours. CBC: Recent Labs    04/29/24 0457 04/30/24 0510  WBC 13.2* 11.2*  HGB 8.8* 8.8*  HCT 29.8* 30.0*  MCV 85.4 86.2  PLT 228 244   Cardiac Enzymes: Recent Labs    04/27/24 1028  TROPONINIHS 111*   BNP: No results for input(s): "BNP" in the last 72 hours.  D-Dimer: No results for input(s): "DDIMER" in the last 72 hours. Hemoglobin A1C: No results for input(s): "HGBA1C" in the last 72 hours. Fasting Lipid Panel: No results for input(s): "CHOL", "HDL", "LDLCALC", "TRIG", "CHOLHDL", "LDLDIRECT" in the last 72 hours. Thyroid Function Tests: No results for input(s): "TSH", "T4TOTAL", "T3FREE", "THYROIDAB" in the last 72 hours.  Invalid input(s): "FREET3" Anemia Panel: No results for input(s): "VITAMINB12", "FOLATE", "FERRITIN", "TIBC", "IRON", "RETICCTPCT" in the last 72  hours.   No results found.   ECHO pending  TELEMETRY reviewed by me 04/30/24: not on tele  EKG reviewed by me 04/30/24: sinus rhythm, rate 64 bpm  DATA reviewed by me 04/30/24: last 24h vitals tele labs imaging I/O hospitalist progress notes.  Principal Problem:   COPD exacerbation (HCC)    ASSESSMENT AND PLAN: Barbara Contreras is a 73 y.o. female with a past medical history of coronary artery disease, HTN, HLD, HOCM, HFpEF, paroxsymal atrial fibrillation (on  Eliquis), COPD (no home O2 requirement) who presented to the ED on 04/27/2024 for shortness of breath. Patient found to be in hypertensive urgency, hypoxic respiratory failure likely due to pulmonary edema vs acute COPD exacerbation. Cardiology consulted for further evaluation.   # COPD exacerbation # Hypertensive urgency # Acute on chronic HFpEF # Paroxsymal atrial fibrillation # Hypertension # Demand ischemia Patient presents to ED with shortness of breath. BNP elevated at 900. EKG without acute ischemic changes. Trops elevated and trended 20 > 80 > 110. Echo from 03/2024 with pEF and grade I diastolic dysfunction. S/p 1x IV lasix  40 BID with reported good UOP. -Echo pending. -Increased amlodipine  to 10 mg daily. -Continue metoprolol  succinate 25 mg daily. -Continue Eliquis for stroke risk reduction. -Continue ASA, Zetia (patient takes Repathta at home every 2 weeks.) -Elevated trops in setting of acute hypoxic respiratory failure with COPD and HF exacerbation likely mismatch demand/supply ischemia.  -COPD management per primary team.   Cardiology will sign off if echo low risk with no acute abnormalities. Please haiku with questions or re-engage if needed.   This patient's case was discussed and created with Dr. Custovic and she is in agreement.  Signed:  Creighton Doffing, PA-C  04/30/2024, 9:04 AM [facility] Cardiology

## 2024-04-30 NOTE — TOC Transition Note (Signed)
 Transition of Care [facility]) - Discharge Note   Patient Details  Name: Barbara Contreras MRN: [id number] Date of Birth: 04-19-1951  [facility] CM/SW Contact:  Loman Risk, RN Phone Number: 04/30/2024, 2:37 PM   Clinical Narrative:      Patient to discharge today Per MD no home health indicated Patient with qualify sats for home O2.  Referral made to [facility] with [employer].  Portable O2 to be delivered to room         Patient Goals and CMS Choice            Discharge Placement                       Discharge Plan and Services Additional resources added to the After Visit Summary for                                       Social Drivers of Health (SDOH) Interventions SDOH Screenings   Food Insecurity: No Food Insecurity (04/27/2024)  Recent Concern: Food Insecurity - Food Insecurity Present (04/21/2024)   Received [facility]  Housing: Unknown (04/27/2024)  Transportation Needs: No Transportation Needs (04/27/2024)  Utilities: Not At Risk (04/27/2024)  Financial Resource Strain: Medium Risk (04/21/2024)   [facility]  Social Connections: Unknown (04/27/2024)  Tobacco Use: High Risk (04/27/2024)     Readmission Risk Interventions     No data to display

## 2024-04-30 NOTE — Discharge Summary (Signed)
 [facility] Discharge Summary   Patient: Barbara Contreras ZOX:[id number]  PCP: Baltazar Leventhal, MD  Date of admission: 04/27/2024   Date of discharge:  04/30/2024     Discharge Diagnoses:  Principal Problem:   COPD exacerbation [facility])   Admitted From: Home Disposition:  Home   Recommendations for Outpatient Follow-up:  F/u with PCP in 1 wk F/u with Cardio in 1-2 weeks Follow up LABS/TEST:  Repeat CBC and BMP after 1 week   Follow-up Information     Janette Medley, MD Follow up on 05/13/2024.   Specialty: Cardiology Why: Go @ 2:45pm. Contact information: [address] [place] [address] [phone]         Baltazar Leventhal, MD Follow up in 1 week(s).   Specialty: Family Medicine Contact information: [address] [place] [address] [phone]                Diet recommendation: Cardiac diet  Activity: The patient is advised to gradually reintroduce usual activities, as tolerated  Discharge Condition: stable  Code Status: Full code   History of present illness: As per the H and P dictated on admission Hospital Course:  Barbara Contreras is a 73 y.o. 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  Patient states she has had experience of progressive shortness of breath of the last few days, however this morning in the early hours it was suddenly worse, she woke up from sleep unable to breathe could not catch her breath.  Denies any chest pain.  She gave herself a home DuoNeb without any relief.  On EMS arrival, she was saturating in the low 80s on room air, they gave her albuterol  treatment placed on oxygen.  She denies any recent significant cough, fevers, chills, chest pain, vomiting.  She does take Eliquis , denies any blood in her stool, urine,  or any hematemesis.  She denies lower extremity edema, or orthopnea.  Recently her nephrologist discontinued her lisinopril /HCTZ due to worsening renal function.      Assessment and Plan:   # Acute hypoxic respiratory failure secondary to COPD exacerbation: Negative COVID, flu and RSV S/p supplemental O2 inhalation, gradually weaned off.  Currently saturating well on room air.   S/p Solu-Medrol , transition to prednisone  40 mg p.o. daily continue home Incruse Ellipta , Singulair , Flonase  S/p DuoNeb every 6 hours   # Acute on chronic diastolic CHF exacerbation Elevated BNP 901 4/25 TTE at [facility] shows LVEF >55%, grade 1 diastolic dysfunction 5/27 started Lasix  40 mg IV daily x 2 doses ordered by cardiology 5/27 TTE LVEF 60-65%, no WMA, GII diastolic dysfunction, LA moderately dilated. Patient was cleared by cardiology to discharge home and follow-up as an outpatient  # Elevated troponin most likely due to demand ischemia Presented with chest pressure and difficulty breathing No significant EKG changes, Troponin peaked at 111, Currently patient is chest pain-free. S/p ASA 81 mg.  Resumed Plavix  home dose.  # h/o PAD s/p carotid and RLE stent.  Patient is on Plavix  which has been resumed on discharge. on Eliquis  for A-fib. Recommend to follow-up with vascular surgery as an outpatient.  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  Her lisinopril Ilona Malta has been held since April. sCr 1.83 on admission, sCr 0.76 improved   #  Vitamin B12 deficiency: B12 level 192, goal >400.  Started vitamin B12 1000 mcg IM injection daily during hospital stay, followed by oral supplement.  Follow-up PCP to repeat vitamin B12 level after 3 to 6 months.   # Iron deficiency anemia, transferrin saturation 4%, s/p Venofer 200 mg 1 dose and Venofer 3 mg x 1 dose given during hospital stay.  Start oral iron supplement with vitamin C on discharge Hb 7.4, patient  received 1 unit of PRBC transfusion on 5/25, posttransfusion hemoglobin 8.8, No evidence of bleeding, safe to continue Eliquis. # Peripheral neuropathy-continue gabapentin  600 mg p.o. 3 times daily home dose  # Constipation, resolved s/p laxatives.   Body mass index is 25.56 kg/m.  Nutrition Interventions:  - Patient was instructed, not to drive, operate heavy machinery, perform activities at heights, swimming or participation in water  activities or provide baby sitting services while on Pain, Sleep and Anxiety Medications; until her outpatient Physician has advised to do so again.  - Also recommended to not to take more than prescribed Pain, Sleep and Anxiety Medications.  Patient was ambulatory without any assistance. On the day of the discharge the patient's vitals were stable, and no other acute medical condition were reported by patient. the patient was felt safe to be discharge at home.  Consultants: Cardiology Procedures: None  Discharge Exam: General: Appear in no distress, no Rash; Oral Mucosa Clear, moist. Cardiovascular: S1 and S2 Present, no Murmur, Respiratory: normal respiratory effort, Bilateral Air entry present and no Crackles, no wheezes Abdomen: Bowel Sound present, Soft and no tenderness, no hernia Extremities: no Pedal edema, no calf tenderness Neurology: alert and oriented to time, place, and person affect appropriate.  Filed Weights   04/27/24 0420  Weight: 70.8 kg   Vitals:   04/30/24 0317 04/30/24 0751  BP: (!) 158/69 (!) 162/84  Pulse: 67 73  Resp: 20 16  Temp: 98 F (36.7 C) 98.8 F (37.1 C)  SpO2: 96% 98%    DISCHARGE MEDICATION: Allergies as of 04/30/2024       Reactions   Morphine And Codeine Itching   Clindamycin Other (See Comments)   Erythromycin    Other reaction(s): Other (see comments)   Other    Other reaction(s): Other (See Comments) Redness and itching   Penicillins Other (See Comments)   TOLERATED CEFAZOLIN  PRIOR Reaction:  unknown Has patient had a PCN reaction causing immediate rash, facial/tongue/throat swelling, SOB or lightheadedness with hypotension: Unknown Has patient had a PCN reaction causing severe rash involving mucus membranes or skin necrosis: Unknown Has patient had a PCN reaction that required hospitalization: Unknown Has patient had a PCN reaction occurring within the last 10 years: no If all of the above answers are "NO", then may proceed with Cephalosporin use.   Promethazine-phenylephrine  Other (See Comments)   Muscle spasm   Tape Other (See Comments)   Redness and itching        Medication List     STOP taking these medications    aspirin  EC 81 MG tablet   celecoxib 200 MG capsule Commonly known as: CELEBREX   lisinopril -hydrochlorothiazide  20-12.5 MG tablet Commonly known as: ZESTORETIC    omeprazole 20 MG capsule Commonly known as: PRILOSEC Replaced by: pantoprazole  40 MG tablet   sulfamethoxazole-trimethoprim 800-160 MG tablet Commonly known as: BACTRIM DS       TAKE these medications    amLODipine  10 MG tablet Commonly known as: NORVASC  Take 1 tablet (10 mg total) by mouth daily. What changed:  medication  strength how much to take   anastrozole  1 MG tablet Commonly known as: ARIMIDEX  Take 1 tablet (1 mg total) by mouth daily.   ascorbic acid 500 MG tablet Commonly known as: VITAMIN C Take 1 tablet (500 mg total) by mouth daily.   baclofen  10 MG tablet Commonly known as: LIORESAL  Take 5-10 mg by mouth at bedtime as needed for muscle spasms.   calcium  carbonate 750 MG chewable tablet Commonly known as: TUMS EX Chew 1 tablet by mouth daily as needed for heartburn.   clobetasol  0.05 % external solution Commonly known as: TEMOVATE  Apply 1 Application topically daily.   clopidogrel  75 MG tablet Commonly known as: PLAVIX  TAKE 1 TABLET BY MOUTH EVERY DAY   cyanocobalamin  1000 MCG tablet Commonly known as: VITAMIN B12 Take 1,000 mcg by mouth daily.    dicyclomine  10 MG capsule Commonly known as: BENTYL  Take 10 mg by mouth in the morning and at bedtime.   docusate sodium  100 MG capsule Commonly known as: COLACE Take 100 mg by mouth 2 (two) times daily.   Eliquis 5 MG Tabs tablet Generic drug: apixaban Take 5 mg by mouth 2 (two) times daily.   ezetimibe 10 MG tablet Commonly known as: ZETIA Take 1 tablet by mouth daily.   famotidine  20 MG tablet Commonly known as: PEPCID  Take 20 mg by mouth 2 (two) times daily.   fluticasone 50 MCG/ACT nasal spray Commonly known as: FLONASE Place 1 spray into both nostrils daily.   gabapentin  600 MG tablet Commonly known as: NEURONTIN  Take 600 mg by mouth 3 (three) times daily.   iron polysaccharides 150 MG capsule Commonly known as: NIFEREX Take 1 capsule (150 mg total) by mouth daily.   ketoconazole  2 % shampoo Commonly known as: NIZORAL  Apply 1 application topically once a week.   metoprolol  succinate 25 MG 24 hr tablet Commonly known as: TOPROL -XL Take 1 tablet by mouth daily.   montelukast 10 MG tablet Commonly known as: SINGULAIR Take 1 tablet by mouth at bedtime.   multivitamin with minerals Tabs tablet Take 1 tablet by mouth daily.   pantoprazole  40 MG tablet Commonly known as: PROTONIX  Take 1 tablet (40 mg total) by mouth at bedtime as needed. Replaces: omeprazole 20 MG capsule   predniSONE 5 MG tablet Commonly known as: DELTASONE Take 5 mg by mouth daily with breakfast.   Repatha SureClick 140 MG/ML Soaj Generic drug: Evolocumab Inject 140 mg into the skin every 14 (fourteen) days.   tiotropium 18 MCG inhalation capsule Commonly known as: SPIRIVA  Place 18 mcg into inhaler and inhale daily.   traMADol  50 MG tablet Commonly known as: ULTRAM  Take 50 mg by mouth every 6 (six) hours as needed for moderate pain (pain score 4-6).   venlafaxine  XR 75 MG 24 hr capsule Commonly known as: EFFEXOR -XR Take 75 mg by mouth 3 (three) times daily.   Vitamin D3 10  MCG (400 UNIT) tablet Take 400 Units by mouth daily.   Wixela Inhub 250-50 MCG/ACT Aepb Generic drug: fluticasone-salmeterol Inhale 1 puff into the lungs in the morning and at bedtime.   ZINC  15 PO Take 15 mg by mouth daily.       Allergies  Allergen Reactions   Morphine And Codeine Itching   Clindamycin Other (See Comments)   Erythromycin     Other reaction(s): Other (see comments)   Other     Other reaction(s): Other (See Comments) Redness and itching   Penicillins Other (See Comments)    TOLERATED  CEFAZOLIN  PRIOR Reaction: unknown Has patient had a PCN reaction causing immediate rash, facial/tongue/throat swelling, SOB or lightheadedness with hypotension: Unknown Has patient had a PCN reaction causing severe rash involving mucus membranes or skin necrosis: Unknown Has patient had a PCN reaction that required hospitalization: Unknown Has patient had a PCN reaction occurring within the last 10 years: no If all of the above answers are "NO", then may proceed with Cephalosporin use.   Promethazine-Phenylephrine  Other (See Comments)    Muscle spasm   Tape Other (See Comments)    Redness and itching   Discharge Instructions     Call MD for:   Complete by: As directed    Uncontrolled BP   Call MD for:  difficulty breathing, headache or visual disturbances   Complete by: As directed    Call MD for:  extreme fatigue   Complete by: As directed    Call MD for:  persistant dizziness or light-headedness   Complete by: As directed    Diet - low sodium heart healthy   Complete by: As directed    Discharge instructions   Complete by: As directed    F/u with PCP in 1 wk F/u with Cardio in 1-2 weeks Repeat CBC and BMP after 1 week   Increase activity slowly   Complete by: As directed        The results of significant diagnostics from this hospitalization (including imaging, microbiology, ancillary and laboratory) are listed below for reference.    Significant Diagnostic  Studies: ECHOCARDIOGRAM COMPLETE Result Date: 04/30/2024    ECHOCARDIOGRAM REPORT   Patient Name:   Barbara Contreras Date of Exam: 04/29/2024 Medical Rec #:  [id number]         Height:       65.5 in Accession #:    [id number]        Weight:       156.0 lb Date of Birth:  1951/07/18         BSA:          1.790 m Patient Age:    73 years          BP:           185/71 mmHg Patient Gender: F                 HR:           62 bpm. Exam Location:  [facility] Procedure: 2D Echo, Cardiac Doppler and Color Doppler (Both Spectral and Color            Flow Doppler were utilized during procedure). Indications:     R07.9 Chest pain  History:         Patient has prior history of Echocardiogram examinations, most                  recent 10/02/2022. CAD, COPD, Signs/Symptoms:Murmur; Risk                  Factors:Hypertension and Dyslipidemia. Emphysema.  Sonographer:     Brigid Canada RDCS Referring Phys:  [id number] Diagnosing Phys: Sabina Custovic IMPRESSIONS  1. Left ventricular ejection fraction, by estimation, is 60 to 65%. The left ventricle has normal function. The left ventricle has no regional wall motion abnormalities. There is mild left ventricular hypertrophy. Left ventricular diastolic parameters are consistent with Grade II diastolic dysfunction (pseudonormalization).  2. Right ventricular systolic function is normal. The right ventricular size is normal.  3. Left atrial  size was moderately dilated.  4. The mitral valve is normal in structure. Mild mitral valve regurgitation. No evidence of mitral stenosis.  5. The aortic valve is calcified. Aortic valve regurgitation is not visualized. Aortic valve sclerosis/calcification is present, without any evidence of aortic stenosis.  6. The inferior vena cava is normal in size with greater than 50% respiratory variability, suggesting right atrial pressure of 3 mmHg. FINDINGS  Left Ventricle: Left ventricular ejection fraction, by estimation, is 60 to 65%. The  left ventricle has normal function. The left ventricle has no regional wall motion abnormalities. The left ventricular internal cavity size was normal in size. There is  mild left ventricular hypertrophy. Left ventricular diastolic parameters are consistent with Grade II diastolic dysfunction (pseudonormalization). Right Ventricle: The right ventricular size is normal. No increase in right ventricular wall thickness. Right ventricular systolic function is normal. Left Atrium: Left atrial size was moderately dilated. Right Atrium: Right atrial size was normal in size. Pericardium: There is no evidence of pericardial effusion. Mitral Valve: The mitral valve is normal in structure. Mild mitral annular calcification. Mild mitral valve regurgitation. No evidence of mitral valve stenosis. Tricuspid Valve: The tricuspid valve is normal in structure. Tricuspid valve regurgitation is mild. The aortic valve is calcified. Aortic valve regurgitation is not visualized. Aortic valve sclerosis/calcification is present, without any evidence of aortic stenosis. Pulmonic Valve: The pulmonic valve was normal in structure. Pulmonic valve regurgitation is not visualized. Aorta: The aortic root is normal in size and structure. Venous: The inferior vena cava is normal in size with greater than 50% respiratory variability, suggesting right atrial pressure of 3 mmHg. IAS/Shunts: No atrial level shunt detected by color flow Doppler.  LEFT VENTRICLE PLAX 2D LVIDd:         4.30 cm   Diastology LVIDs:         2.80 cm   LV e' medial:    6.69 cm/s LV PW:         1.50 cm   LV E/e' medial:  20.7 LV IVS:        1.50 cm   LV e' lateral:   7.18 cm/s LVOT diam:     1.60 cm   LV E/e' lateral: 19.3 LV SV:         77 LV SV Index:   43 LVOT Area:     2.01 cm  RIGHT VENTRICLE             IVC RV Basal diam:  3.30 cm     IVC diam: 1.10 cm RV S prime:     16.00 cm/s TAPSE (M-mode): 2.2 cm LEFT ATRIUM             Index        RIGHT ATRIUM           Index LA  diam:        5.20 cm 2.90 cm/m   RA Area:     12.20 cm LA Vol (A2C):   67.2 ml 37.54 ml/m  RA Volume:   30.00 ml  16.76 ml/m LA Vol (A4C):   80.1 ml 44.75 ml/m LA Biplane Vol: 74.8 ml 41.79 ml/m  AORTIC VALVE AV Area (Vmax):    2.30 cm AV Area (Vmean):   2.28 cm AV Area (VTI):     2.25 cm AV Vmax:           171.00 cm/s AV Vmean:          112.333 cm/s AV  VTI:            0.341 m AV Peak Grad:      11.7 mmHg AV Mean Grad:      6.3 mmHg LVOT Vmax:         195.67 cm/s LVOT Vmean:        127.333 cm/s LVOT VTI:          0.381 m LVOT/AV VTI ratio: 1.12  AORTA Ao Root diam: 3.20 cm Ao Asc diam:  3.60 cm MITRAL VALVE MV Area (PHT): 2.59 cm     SHUNTS MV Decel Time: 293 msec     Systemic VTI:  0.38 m MV E velocity: 138.50 cm/s  Systemic Diam: 1.60 cm MV A velocity: 114.50 cm/s MV E/A ratio:  1.21 Sabina Custovic Electronically signed by Isabell Manzanilla Signature Date/Time: 04/30/2024/10:52:09 AM    Final    DG Chest Port 1 View Result Date: 04/27/2024 CLINICAL DATA:  Dyspnea EXAM: PORTABLE CHEST 1 VIEW COMPARISON:  01/29/2023 FINDINGS: Cardiomegaly and interstitial opacity with hazy opacity at the bases greater on the right. No pneumothorax. Artifact from EKG leads. IMPRESSION: Hazy opacification in the lower lungs similar to 01/29/2023, infection or failure could have this appearance. Electronically Signed   By: Ronnette Coke M.D.   On: 04/27/2024 05:27    Microbiology: Recent Results (from the past 240 hours)  Resp panel by RT-PCR (RSV, Flu A&B, Covid) Anterior Nasal Swab     Status: None   Collection Time: 04/27/24  9:42 AM   Specimen: Anterior Nasal Swab  Result Value Ref Range Status   SARS Coronavirus 2 by RT PCR NEGATIVE NEGATIVE Final    Comment: (NOTE) SARS-CoV-2 target nucleic acids are NOT DETECTED.  The SARS-CoV-2 RNA is generally detectable in upper respiratory specimens during the acute phase of infection. The lowest concentration of SARS-CoV-2 viral copies this assay can detect  is 138 copies/mL. A negative result does not preclude SARS-Cov-2 infection and should not be used as the sole basis for treatment or other patient management decisions. A negative result may occur with  improper specimen collection/handling, submission of specimen other than nasopharyngeal swab, presence of viral mutation(s) within the areas targeted by this assay, and inadequate number of viral copies(<138 copies/mL). A negative result must be combined with clinical observations, patient history, and epidemiological information. The expected result is Negative.  Fact Sheet for Patients:  BloggerCourse.com  Fact Sheet for Healthcare Providers:  SeriousBroker.it  This test is no t yet approved or cleared by the [place]  FDA and  has been authorized for detection and/or diagnosis of SARS-CoV-2 by FDA under an Emergency Use Authorization (EUA). This EUA will remain  in effect (meaning this test can be used) for the duration of the COVID-19 declaration under Section 564(b)(1) of the Act, 21 U.S.C.section 360bbb-3(b)(1), unless the authorization is terminated  or revoked sooner.       Influenza A by PCR NEGATIVE NEGATIVE Final   Influenza B by PCR NEGATIVE NEGATIVE Final    Comment: (NOTE) The Xpert Xpress SARS-CoV-2/FLU/RSV plus assay is intended as an aid in the diagnosis of influenza from Nasopharyngeal swab specimens and should not be used as a sole basis for treatment. Nasal washings and aspirates are unacceptable for Xpert Xpress SARS-CoV-2/FLU/RSV testing.  Fact Sheet for Patients: BloggerCourse.com  Fact Sheet for Healthcare Providers: SeriousBroker.it  This test is not yet approved or cleared by the [place]  FDA and has been authorized for detection and/or diagnosis of SARS-CoV-2 by FDA  under an Emergency Use Authorization (EUA). This EUA will remain in effect  (meaning this test can be used) for the duration of the COVID-19 declaration under Section 564(b)(1) of the Act, 21 U.S.C. section 360bbb-3(b)(1), unless the authorization is terminated or revoked.     Resp Syncytial Virus by PCR NEGATIVE NEGATIVE Final    Comment: (NOTE) Fact Sheet for Patients: BloggerCourse.com  Fact Sheet for Healthcare Providers: SeriousBroker.it  This test is not yet approved or cleared by the [place]  FDA and has been authorized for detection and/or diagnosis of SARS-CoV-2 by FDA under an Emergency Use Authorization (EUA). This EUA will remain in effect (meaning this test can be used) for the duration of the COVID-19 declaration under Section 564(b)(1) of the Act, 21 U.S.C. section 360bbb-3(b)(1), unless the authorization is terminated or revoked.  Performed at [facility], [address] [place], [place] [address]      Labs: CBC: Recent Labs  Lab 04/27/24 0425 04/27/24 1028 04/28/24 0443 04/29/24 0457 04/30/24 0510  WBC 8.8 9.5 9.4 13.2* 11.2*  HGB 7.4* 9.0* 8.3* 8.8* 8.8*  HCT 25.8* 29.9* 27.6* 29.8* 30.0*  MCV 84.9 84.7 84.9 85.4 86.2  PLT 236 235 228 228 244   Basic Metabolic Panel: Recent Labs  Lab 04/27/24 0425 04/28/24 0443 04/29/24 0457 04/30/24 0510  NA 141 139 141 144  K 4.3 3.8 4.1 3.7  CL 105 102 105 103  CO2 26 28 29  33*  GLUCOSE 126* 88 106* 80  BUN 24* 22 24* 24*  CREATININE 1.83* 0.94 0.77 0.76  CALCIUM  8.4* 8.6* 9.3 8.8*  MG  --  2.3 2.3 2.2  PHOS  --  3.7 3.8 3.7   Liver Function Tests: No results for input(s): "AST", "ALT", "ALKPHOS", "BILITOT", "PROT", "ALBUMIN" in the last 168 hours. No results for input(s): "LIPASE", "AMYLASE" in the last 168 hours. No results for input(s): "AMMONIA" in the last 168 hours. Cardiac Enzymes: No results for input(s): "CKTOTAL", "CKMB", "CKMBINDEX", "TROPONINI" in the last 168 hours. BNP (last 3 results) Recent  Labs    04/27/24 0425  BNP 901.6*   CBG: No results for input(s): "GLUCAP" in the last 168 hours.  Time spent: 35 minutes  Signed:  Althia Atlas  [facility] 04/30/2024 12:24 PM

## 2024-04-30 NOTE — Progress Notes (Signed)
 Room Air Laying down - 93% Sitting-92% Standing 91% Walking 87-88%  On oxygen 2L  Walking 93-94%% Sitting 95%

## 2024-05-06 ENCOUNTER — Other Ambulatory Visit: Payer: Self-pay | Admitting: Orthopedic Surgery

## 2024-05-06 DIAGNOSIS — M48062 Spinal stenosis, lumbar region with neurogenic claudication: Secondary | ICD-10-CM

## 2024-05-06 DIAGNOSIS — M5416 Radiculopathy, lumbar region: Secondary | ICD-10-CM

## 2024-05-06 DIAGNOSIS — M4807 Spinal stenosis, lumbosacral region: Secondary | ICD-10-CM

## 2024-05-06 DIAGNOSIS — M5417 Radiculopathy, lumbosacral region: Secondary | ICD-10-CM

## 2024-05-14 ENCOUNTER — Other Ambulatory Visit: Payer: Self-pay | Admitting: Cardiology

## 2024-05-14 DIAGNOSIS — I421 Obstructive hypertrophic cardiomyopathy: Secondary | ICD-10-CM

## 2024-06-04 ENCOUNTER — Other Ambulatory Visit: Payer: Self-pay

## 2024-06-10 ENCOUNTER — Other Ambulatory Visit: Payer: Self-pay | Admitting: Emergency Medicine

## 2024-06-10 DIAGNOSIS — Z122 Encounter for screening for malignant neoplasm of respiratory organs: Secondary | ICD-10-CM

## 2024-06-10 DIAGNOSIS — J432 Centrilobular emphysema: Secondary | ICD-10-CM

## 2024-06-10 DIAGNOSIS — F17210 Nicotine dependence, cigarettes, uncomplicated: Secondary | ICD-10-CM

## 2024-06-20 ENCOUNTER — Ambulatory Visit

## 2024-06-20 ENCOUNTER — Ambulatory Visit
Admission: RE | Admit: 2024-06-20 | Discharge: 2024-06-20 | Disposition: A | Discharge status: Home / Self Care | Source: Ambulatory Visit | Attending: Emergency Medicine | Admitting: Emergency Medicine

## 2024-06-20 DIAGNOSIS — Z122 Encounter for screening for malignant neoplasm of respiratory organs: Secondary | ICD-10-CM | POA: Insufficient documentation

## 2024-06-20 DIAGNOSIS — J432 Centrilobular emphysema: Secondary | ICD-10-CM | POA: Insufficient documentation

## 2024-06-20 DIAGNOSIS — F17210 Nicotine dependence, cigarettes, uncomplicated: Secondary | ICD-10-CM | POA: Insufficient documentation

## 2024-07-16 ENCOUNTER — Other Ambulatory Visit: Payer: Self-pay | Admitting: Cardiology

## 2024-07-16 ENCOUNTER — Ambulatory Visit
Admission: RE | Admit: 2024-07-16 | Discharge: 2024-07-16 | Disposition: A | Discharge status: Home / Self Care | Source: Ambulatory Visit | Attending: Orthopedic Surgery | Admitting: Orthopedic Surgery

## 2024-07-16 ENCOUNTER — Ambulatory Visit
Admission: RE | Admit: 2024-07-16 | Discharge: 2024-07-16 | Disposition: A | Discharge status: Home / Self Care | Source: Ambulatory Visit | Attending: Cardiology | Admitting: Cardiology

## 2024-07-16 DIAGNOSIS — M48062 Spinal stenosis, lumbar region with neurogenic claudication: Secondary | ICD-10-CM

## 2024-07-16 DIAGNOSIS — M5416 Radiculopathy, lumbar region: Secondary | ICD-10-CM

## 2024-07-16 DIAGNOSIS — M5417 Radiculopathy, lumbosacral region: Secondary | ICD-10-CM

## 2024-07-16 DIAGNOSIS — I421 Obstructive hypertrophic cardiomyopathy: Secondary | ICD-10-CM

## 2024-07-16 DIAGNOSIS — M4807 Spinal stenosis, lumbosacral region: Secondary | ICD-10-CM

## 2024-07-16 MED ORDER — GADOBUTROL 1 MMOL/ML IV SOLN
10.0000 mL | Freq: Once | INTRAVENOUS | Status: AC | PRN
  Administered 2024-07-16 (×2): 10 mL via INTRAVENOUS

## 2024-07-25 ENCOUNTER — Other Ambulatory Visit

## 2024-08-14 ENCOUNTER — Other Ambulatory Visit: Payer: Self-pay | Admitting: Family Medicine

## 2024-08-14 DIAGNOSIS — Z1231 Encounter for screening mammogram for malignant neoplasm of breast: Secondary | ICD-10-CM

## 2024-08-16 ENCOUNTER — Other Ambulatory Visit (INDEPENDENT_AMBULATORY_CARE_PROVIDER_SITE_OTHER): Payer: Self-pay | Admitting: Vascular Surgery

## 2024-08-19 NOTE — Telephone Encounter (Signed)
 Patient will not receive more refills until she has been seen in the office.

## 2024-09-08 ENCOUNTER — Ambulatory Visit

## 2024-09-08 ENCOUNTER — Inpatient Hospital Stay

## 2024-09-08 ENCOUNTER — Inpatient Hospital Stay: Attending: Internal Medicine

## 2024-09-08 ENCOUNTER — Inpatient Hospital Stay: Admitting: Internal Medicine

## 2024-09-08 ENCOUNTER — Encounter: Payer: Self-pay | Admitting: Internal Medicine

## 2024-09-08 DIAGNOSIS — C50411 Malignant neoplasm of upper-outer quadrant of right female breast: Secondary | ICD-10-CM | POA: Diagnosis present

## 2024-09-08 DIAGNOSIS — Z79811 Long term (current) use of aromatase inhibitors: Secondary | ICD-10-CM | POA: Diagnosis not present

## 2024-09-08 DIAGNOSIS — Z7901 Long term (current) use of anticoagulants: Secondary | ICD-10-CM | POA: Diagnosis not present

## 2024-09-08 DIAGNOSIS — M810 Age-related osteoporosis without current pathological fracture: Secondary | ICD-10-CM | POA: Insufficient documentation

## 2024-09-08 DIAGNOSIS — I110 Hypertensive heart disease with heart failure: Secondary | ICD-10-CM | POA: Insufficient documentation

## 2024-09-08 DIAGNOSIS — I509 Heart failure, unspecified: Secondary | ICD-10-CM | POA: Insufficient documentation

## 2024-09-08 DIAGNOSIS — Z79899 Other long term (current) drug therapy: Secondary | ICD-10-CM | POA: Insufficient documentation

## 2024-09-08 DIAGNOSIS — Z170 Estrogen receptor positive status [ER+]: Secondary | ICD-10-CM | POA: Diagnosis not present

## 2024-09-08 DIAGNOSIS — Z923 Personal history of irradiation: Secondary | ICD-10-CM | POA: Insufficient documentation

## 2024-09-08 DIAGNOSIS — Z7951 Long term (current) use of inhaled steroids: Secondary | ICD-10-CM | POA: Diagnosis not present

## 2024-09-08 DIAGNOSIS — F17210 Nicotine dependence, cigarettes, uncomplicated: Secondary | ICD-10-CM | POA: Insufficient documentation

## 2024-09-08 DIAGNOSIS — Z1732 Human epidermal growth factor receptor 2 negative status: Secondary | ICD-10-CM | POA: Diagnosis not present

## 2024-09-08 DIAGNOSIS — Z1721 Progesterone receptor positive status: Secondary | ICD-10-CM | POA: Diagnosis not present

## 2024-09-08 LAB — CMP (CANCER CENTER ONLY)
ALT: 18 U/L (ref 0–44)
AST: 21 U/L (ref 15–41)
Albumin: 3.8 g/dL (ref 3.5–5.0)
Alkaline Phosphatase: 64 U/L (ref 38–126)
Anion gap: 7 (ref 5–15)
BUN: 18 mg/dL (ref 8–23)
CO2: 28 mmol/L (ref 22–32)
Calcium: 8.9 mg/dL (ref 8.9–10.3)
Chloride: 105 mmol/L (ref 98–111)
Creatinine: 0.93 mg/dL (ref 0.44–1.00)
GFR, Estimated: >60 mL/min (ref >60)
Glucose, Bld: 128 mg/dL — ABNORMAL HIGH (ref 70–99)
Potassium: 4 mmol/L (ref 3.5–5.1)
Sodium: 140 mmol/L (ref 135–145)
Total Bilirubin: 0.6 mg/dL (ref 0.0–1.2)
Total Protein: 6.4 g/dL — ABNORMAL LOW (ref 6.5–8.1)

## 2024-09-08 LAB — CBC WITH DIFFERENTIAL (CANCER CENTER ONLY)
Abs Immature Granulocytes: 0.02 K/uL (ref 0.00–0.07)
Basophils Absolute: 0 K/uL (ref 0.0–0.1)
Basophils Relative: 1 %
Eosinophils Absolute: 0.1 K/uL (ref 0.0–0.5)
Eosinophils Relative: 2 %
HCT: 40.7 % (ref 36.0–46.0)
Hemoglobin: 12.4 g/dL (ref 12.0–15.0)
Immature Granulocytes: 0 %
Lymphocytes Relative: 23 %
Lymphs Abs: 1.2 K/uL (ref 0.7–4.0)
MCH: 25.7 pg — ABNORMAL LOW (ref 26.0–34.0)
MCHC: 30.5 g/dL (ref 30.0–36.0)
MCV: 84.4 fL (ref 80.0–100.0)
Monocytes Absolute: 0.3 K/uL (ref 0.1–1.0)
Monocytes Relative: 6 %
Neutro Abs: 3.6 K/uL (ref 1.7–7.7)
Neutrophils Relative %: 68 %
Platelet Count: 202 K/uL (ref 150–400)
RBC: 4.82 MIL/uL (ref 3.87–5.11)
RDW: 20.1 % — ABNORMAL HIGH (ref 11.5–15.5)
WBC Count: 5.3 K/uL (ref 4.0–10.5)
nRBC: 0 % (ref 0.0–0.2)

## 2024-09-08 NOTE — Assessment & Plan Note (Addendum)
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  S/p postlumpectomy radiation; 11/28 last RT. Sep 2024- Bil-Mammo-WNL.  Awaiting BIL mammogram in OCT [PCP]  # Currently on Anastrazole [since jan 2023 x 5 years]- stable. Continue anastrozole . Tolerating well. - stable.   # MAY 2025- acute respiratory failure-attributed to COPD/CHF- stable.    # Hot flashes- G-1 monitor for now.   # OSTEOPOROSIS: [2024]- T-score: of -2.7. continue Ca+ vitD.  Discussed the potential side effects including but not limited to-infusion reaction; hypocalcemia; and Osteo-necrosis of jaw. Patient interested in Reclast- will proceed in1 week/pt pref.   # PVD s/p Left CEA-on aspirin  plus Plavix  [as per vascular]-stable.  OCT 2025- reclast # DISPOSITION: # reclast in next 1 week # follow up in 6  months-  MD; labs- cbc/cmp; - Dr.B

## 2024-09-08 NOTE — Progress Notes (Signed)
 [facility] CONSULT NOTE  Patient Care Team: Eliverto Bette Hover, MD as PCP - General (Family Medicine) Cindie Jesusa HERO, RN as Oncology Nurse Navigator Rennie Cindy SAUNDERS, MD as Consulting Physician (Oncology) Lenn Aran, MD as Consulting Physician (Radiation Oncology) Theotis Lavelle BRAVO, MD as Referring Physician (Pulmonary Disease) Tye Millet, DO as Consulting Physician (Surgery) Hester Wolm PARAS, MD as Consulting Physician (Cardiology)  CHIEF COMPLAINTS/PURPOSE OF CONSULTATION: Breast cancer  #  Oncology History Overview Note  # AUG 2022[facility] -Tubular; G-1 ER > 90%; PR 51-90%; her 2=0; [Dr.Sakai]  IMPRESSION: 1. There is a highly suspicious mass in the right breast at 1 o'clock measuring 8 mm.   2. There is an indeterminate right breast mass at 2 o'clock measuring 7 mm.   3.  No evidence of right axillary lymphadenopathy.   Carcinoma of upper-outer quadrant of right breast in female, estrogen receptor positive (HCC)  08/03/2021 Initial Diagnosis   Carcinoma of upper-outer quadrant of right breast in female, estrogen receptor positive (HCC)      HISTORY OF PRESENTING ILLNESS: Alone.  Ambulating independently.  Barbara Contreras 73 y.o.  female patient breast cancer ER/PR positive HER2 negative of positive stage I breast cancer currently status post lumpectomy followed by radiation.  Patient is currently on anastrozole  since January 2023.  Patient was recently admitted to hospital for acute respiratory failure-attributed to COPD/CHF.   Today she feels good.  Denies any swelling of the legs.  Denies any falls.  Appetite is good.  She admits to compliance with her anastrozole .  Denies any hot flashes.  Review of Systems  Constitutional:  Negative for chills, diaphoresis, fever, malaise/fatigue and weight loss.  HENT:  Negative for nosebleeds and sore throat.   Eyes:  Negative for double vision.  Respiratory:  Negative for cough, hemoptysis,  sputum production, shortness of breath and wheezing.   Cardiovascular:  Negative for chest pain, palpitations, orthopnea and leg swelling.  Gastrointestinal:  Negative for abdominal pain, blood in stool, constipation, heartburn, melena and nausea.  Genitourinary:  Negative for dysuria, frequency and urgency.  Skin: Negative.  Negative for itching and rash.  Neurological:  Negative for tingling, focal weakness and headaches.  Endo/Heme/Allergies:  Does not bruise/bleed easily.  Psychiatric/Behavioral:  Negative for depression. The patient is not nervous/anxious and does not have insomnia.      MEDICAL HISTORY:  Past Medical History:  Diagnosis Date   Arthritis    Asthma    Back pain    Breast cancer (HCC) 2022   Carotid artery stenosis    Carotid stenosis    Coronary artery disease    Depression    Emphysema of lung (HCC)    GERD (gastroesophageal reflux disease)    Heart murmur    Hip pain    History of Lyme disease    HLD (hyperlipidemia)    Hyperlipemia    Hypertension    IBS (irritable bowel syndrome)    IBS (irritable bowel syndrome)    Lyme disease    Personal history of radiation therapy    Pre-diabetes    Sciatica    Sciatica    Seizures (HCC)    Tremor    Vitamin D  deficiency    Vitamin D  deficiency     SURGICAL HISTORY: Past Surgical History:  Procedure Laterality Date   ABDOMINAL HYSTERECTOMY     BREAST BIOPSY Right 07/22/2021   us  bx 1:00 area, venus marker, IMC tubular features   BREAST BIOPSY Right 07/22/2021  us  bx 2:00 ribbon marker, fibroadenoma   BREAST LUMPECTOMY Right 08/24/2021   re-excision   BREAST LUMPECTOMY Right 08/12/2021   CAROTID ENDARTERECTOMY Right    CAROTID PTA/STENT INTERVENTION Left 01/25/2023   Procedure: CAROTID PTA/STENT INTERVENTION;  Surgeon: Marea Selinda RAMAN, MD;  Location: [facility] INVASIVE CV LAB;  Service: Cardiovascular;  Laterality: Left;   CHOLECYSTECTOMY     COLONOSCOPY WITH PROPOFOL  N/A 09/13/2018   Procedure:  COLONOSCOPY WITH PROPOFOL ;  Surgeon: Gaylyn Gladis PENNER, MD;  Location: [facility] ENDOSCOPY;  Service: Endoscopy;  Laterality: N/A;   COLONOSCOPY WITH PROPOFOL  N/A 08/17/2023   Procedure: COLONOSCOPY WITH PROPOFOL ;  Surgeon: Maryruth Ole DASEN, MD;  Location: [facility] ENDOSCOPY;  Service: Endoscopy;  Laterality: N/A;   ESOPHAGOGASTRODUODENOSCOPY N/A 05/03/2021   Procedure: ESOPHAGOGASTRODUODENOSCOPY (EGD);  Surgeon: Maryruth Ole DASEN, MD;  Location: [facility] END[facility]COPY;  Service: Endoscopy;  Laterality: N/A;   HEMOSTASIS CLIP PLACEMENT  08/17/2023   Procedure: HEMOSTASIS CLIP PLACEMENT;  Surgeon: Maryruth Ole DASEN, MD;  Location: [facility] ENDOSCOPY;  Service: Endoscopy;;   paranasal sinusotomy     PART MASTECTOMY,RADIO FREQUENCY LOCALIZER,AXILLARY SENTINEL NODE BIOPSY Right 08/12/2021   Procedure: PART MASTECTOMY,RADIO FREQUENCY LOCALIZER,AXILLARY SENTINEL NODE BIOPSY;  Surgeon: Tye Millet, DO;  Location: [facility] ORS;  Service: General;  Laterality: Right;   POLYPECTOMY  08/17/2023   Procedure: POLYPECTOMY;  Surgeon: Maryruth Ole DASEN, MD;  Location: [facility] ENDOSCOPY;  Service: Endoscopy;;   RE-EXCISION OF BREAST CANCER,SUPERIOR MARGINS Right 08/24/2021   Procedure: RE-EXCISION OF BREAST CANCER,SUPERIOR MARGINS;  Surgeon: Tye Millet, DO;  Location: [facility] ORS;  Service: General;  Laterality: Right;   SUBMUCOSAL LIFTING INJECTION  08/17/2023   Procedure: SUBMUCOSAL LIFTING INJECTION;  Surgeon: Maryruth Ole DASEN, MD;  Location: [facility] ENDOSCOPY;  Service: Endoscopy;;    SOCIAL HISTORY: Social History   Socioeconomic History   Marital status: Divorced    Spouse name: Not on file   Number of children: Not on file   Years of education: Not on file   Highest education level: Not on file  Occupational History   Not on file  Tobacco Use   Smoking status: Every Day    Current packs/day: 0.50    Average packs/day: 0.5 packs/day for 55.0 years (27.5 ttl pk-yrs)    Types: Cigarettes   Smokeless tobacco: Never   Vaping Use   Vaping status: Never Used  Substance and Sexual Activity   Alcohol use: Yes    Alcohol/week: 0.0 standard drinks of alcohol    Comment: occasional   Drug use: Yes    Types: Marijuana    Comment: none for 1 week   Sexual activity: Not on file  Other Topics Concern   Not on file  Social History Narrative   [place]25-30 mins; lives by self; no children/husband; sister near by. Smoker 1ppd/marijuana. Alcohol- beer/liqqor; retd from sales.    Social Drivers of Health   Financial Resource Strain: Medium Risk (04/21/2024)   Received from [facility]   Overall Financial Resource Strain (CARDIA)    Difficulty of Paying Living Expenses: Somewhat hard  Food Insecurity: No Food Insecurity (04/27/2024)   Hunger Vital Sign    Worried About Running Out of Food in the Last Year: Never true    Ran Out of Food in the Last Year: Never true  Recent Concern: Food Insecurity - Food Insecurity Present (04/21/2024)   Received from [facility]   Hunger Vital Sign    Within the past 12 months, you worried that your food would  run out before you got the money to buy more.: Sometimes true    Within the past 12 months, the food you bought just didn't last and you didn't have money to get more.: Sometimes true  Transportation Needs: No Transportation Needs (04/27/2024)   PRAPARE - Administrator, Civil Service (Medical): No    Lack of Transportation (Non-Medical): No  Physical Activity: Not on file  Stress: Not on file  Social Connections: Unknown (04/27/2024)   Social Connection and Isolation Panel    Frequency of Communication with Friends and Family: Never    Frequency of Social Gatherings with Friends and Family: Never    Attends Religious Services: Never    Database administrator or Organizations: No    Attends Banker Meetings: Never    Marital Status: Patient declined  Catering manager Violence: Not At Risk  (04/27/2024)   Humiliation, Afraid, Rape, and Kick questionnaire    Fear of Current or Ex-Partner: No    Emotionally Abused: No    Physically Abused: No    Sexually Abused: No    FAMILY HISTORY: Family History  Problem Relation Age of Onset   Alcohol abuse Father    Alzheimer's disease Father    Anxiety disorder Father    Depression Father    Alcohol abuse Mother    CAD Mother    Diabetes Mother    Hyperlipidemia Mother    Depression Mother     ALLERGIES:  is allergic to morphine and codeine, clindamycin, erythromycin, other, penicillins, promethazine-phenylephrine , and tape.  MEDICATIONS:  Current Outpatient Medications  Medication Sig Dispense Refill   amLODipine  (NORVASC ) 10 MG tablet Take 1 tablet (10 mg total) by mouth daily. 30 tablet 11   anastrozole  (ARIMIDEX ) 1 MG tablet Take 1 tablet (1 mg total) by mouth daily. 90 tablet 2   baclofen  (LIORESAL ) 10 MG tablet Take 5-10 mg by mouth at bedtime as needed for muscle spasms.     calcium  carbonate (TUMS EX) 750 MG chewable tablet Chew 1 tablet by mouth daily as needed for heartburn.     Cholecalciferol  (VITAMIN D3) 10 MCG (400 UNIT) tablet Take 400 Units by mouth daily.     clobetasol  (TEMOVATE ) 0.05 % external solution Apply 1 Application topically daily.     dicyclomine  (BENTYL ) 10 MG capsule Take 10 mg by mouth in the morning and at bedtime.     docusate sodium  (COLACE) 100 MG capsule Take 100 mg by mouth 2 (two) times daily.     ezetimibe  (ZETIA ) 10 MG tablet Take 1 tablet by mouth daily.     famotidine  (PEPCID ) 20 MG tablet Take 20 mg by mouth 2 (two) times daily.     fluticasone  (FLONASE ) 50 MCG/ACT nasal spray Place 1 spray into both nostrils daily.     fluticasone -salmeterol (WIXELA INHUB) 250-50 MCG/ACT AEPB Inhale 1 puff into the lungs in the morning and at bedtime.     gabapentin  (NEURONTIN ) 600 MG tablet Take 600 mg by mouth 3 (three) times daily.  5   iron  polysaccharides (NIFEREX) 150 MG capsule Take 1 capsule  (150 mg total) by mouth daily. 90 capsule 0   ketoconazole  (NIZORAL ) 2 % shampoo Apply 1 application topically once a week.     metoprolol  succinate (TOPROL -XL) 25 MG 24 hr tablet Take 1 tablet by mouth daily.     montelukast  (SINGULAIR ) 10 MG tablet Take 1 tablet by mouth at bedtime.     Multiple Vitamin (MULTIVITAMIN WITH MINERALS)  TABS tablet Take 1 tablet by mouth daily.     pantoprazole  (PROTONIX ) 40 MG tablet Take 1 tablet (40 mg total) by mouth at bedtime as needed. 30 tablet 2   REPATHA SURECLICK 140 MG/ML SOAJ Inject 140 mg into the skin every 14 (fourteen) days.     tiotropium (SPIRIVA ) 18 MCG inhalation capsule Place 18 mcg into inhaler and inhale daily.     traMADol  (ULTRAM ) 50 MG tablet Take 50 mg by mouth every 6 (six) hours as needed for moderate pain (pain score 4-6).     venlafaxine  XR (EFFEXOR -XR) 75 MG 24 hr capsule Take 75 mg by mouth 3 (three) times daily.     vitamin B-12 (CYANOCOBALAMIN ) 1000 MCG tablet Take 1,000 mcg by mouth daily.     clopidogrel  (PLAVIX ) 75 MG tablet TAKE 1 TABLET BY MOUTH EVERY DAY 60 tablet 0   ELIQUIS  5 MG TABS tablet Take 5 mg by mouth 2 (two) times daily.     No current facility-administered medications for this visit.      Barbara Contreras  PHYSICAL EXAMINATION: ECOG PERFORMANCE STATUS: 0 - Asymptomatic  Vitals:   09/08/24 1402  BP: (!) 126/57  Pulse: (!) 58  Resp: 16  Temp: 98 F (36.7 C)  SpO2: 97%   Filed Weights   09/08/24 1402  Weight: 155 lb 8 oz (70.5 kg)    Physical Exam Vitals and nursing note reviewed.  HENT:     Head: Normocephalic and atraumatic.     Mouth/Throat:     Pharynx: Oropharynx is clear.  Eyes:     Extraocular Movements: Extraocular movements intact.     Pupils: Pupils are equal, round, and reactive to light.  Cardiovascular:     Rate and Rhythm: Normal rate and regular rhythm.     Heart sounds: Murmur heard.  Pulmonary:     Comments: Decreased breath sounds bilaterally.  Abdominal:     Palpations: Abdomen is  soft.  Musculoskeletal:        General: Normal range of motion.     Cervical back: Normal range of motion.  Skin:    General: Skin is warm.  Neurological:     General: No focal deficit present.     Mental Status: She is alert and oriented to person, place, and time.  Psychiatric:        Behavior: Behavior normal.        Judgment: Judgment normal.      LABORATORY DATA:  I have reviewed the data as listed Lab Results  Component Value Date   WBC 5.3 09/08/2024   HGB 12.4 09/08/2024   HCT 40.7 09/08/2024   MCV 84.4 09/08/2024   PLT 202 09/08/2024   Recent Labs    03/10/24 1529 04/27/24 0425 04/29/24 0457 04/30/24 0510 09/08/24 1404  NA 136   < > 141 144 140  K 4.1   < > 4.1 3.7 4.0  CL 105   < > 105 103 105  CO2 27   < > 29 33* 28  GLUCOSE 144*   < > 106* 80 128*  BUN 25*   < > 24* 24* 18  CREATININE 1.35*   < > 0.77 0.76 0.93  CALCIUM  8.5*   < > 9.3 8.8* 8.9  GFRNONAA 41*   < > >60 >60 >60  PROT 6.4*  --   --   --  6.4*  ALBUMIN 3.5  --   --   --  3.8  AST 17  --   --   --  21  ALT 22  --   --   --  18  ALKPHOS 55  --   --   --  64  BILITOT 0.5  --   --   --  0.6   < > = values in this interval not displayed.    RADIOGRAPHIC STUDIES: I have personally reviewed the radiological images as listed and agreed with the findings in the report. No results found.  ASSESSMENT & PLAN:   Carcinoma of upper-outer quadrant of right breast in female, estrogen receptor positive (HCC) #Stage I pT1a grade 1- ER/PR positive; her2 NEU-NEGATIVE.  S/p postlumpectomy radiation; 11/28 last RT. Sep 2024- Bil-Mammo-WNL.  Awaiting BIL mammogram in OCT [PCP]  # Currently on Anastrazole [since jan 2023 x 5 years]- stable. Continue anastrozole . Tolerating well. - stable.   # MAY 2025- acute respiratory failure-attributed to COPD/CHF- stable.    # Hot flashes- G-1 monitor for now.   # OSTEOPOROSIS: [2024]- T-score: of -2.7. continue Ca+ vitD.  Discussed the potential side effects  including but not limited to-infusion reaction; hypocalcemia; and Osteo-necrosis of jaw. Patient interested in Reclast- will proceed in1 week/pt pref.   # PVD s/p Left CEA-on aspirin  plus Plavix  [as per vascular]-stable.  OCT 2025- reclast # DISPOSITION: # reclast in next 1 week # follow up in 6  months-  MD; labs- cbc/cmp; - Dr.B    All questions were answered. The patient/family knows to call the clinic with any problems, questions or concerns.    Cindy JONELLE Joe, MD 09/08/2024 3:13 PM

## 2024-09-08 NOTE — Progress Notes (Signed)
 04/27/24 seen at ER for respiratory failure.

## 2024-09-11 ENCOUNTER — Ambulatory Visit

## 2024-09-15 ENCOUNTER — Inpatient Hospital Stay

## 2024-09-15 VITALS — BP 132/58 | HR 52 | Temp 98.0°F | Resp 18

## 2024-09-15 DIAGNOSIS — M810 Age-related osteoporosis without current pathological fracture: Secondary | ICD-10-CM | POA: Diagnosis not present

## 2024-09-15 DIAGNOSIS — C50411 Malignant neoplasm of upper-outer quadrant of right female breast: Secondary | ICD-10-CM

## 2024-09-15 MED ORDER — ZOLEDRONIC ACID 5 MG/100ML IV SOLN
5.0000 mg | Freq: Once | INTRAVENOUS | Status: AC
  Administered 2024-09-15: 5 mg via INTRAVENOUS
  Filled 2024-09-15: qty 100

## 2024-09-15 MED ORDER — SODIUM CHLORIDE 0.9 % IV SOLN
Freq: Once | INTRAVENOUS | Status: AC
  Filled 2024-09-15: qty 250

## 2024-09-15 NOTE — Patient Instructions (Signed)

## 2024-10-14 ENCOUNTER — Other Ambulatory Visit (INDEPENDENT_AMBULATORY_CARE_PROVIDER_SITE_OTHER): Payer: Self-pay | Admitting: Nurse Practitioner

## 2024-10-20 ENCOUNTER — Ambulatory Visit
Admission: RE | Admit: 2024-10-20 | Discharge: 2024-10-20 | Disposition: A | Discharge status: Home / Self Care | Source: Ambulatory Visit | Attending: Family Medicine | Admitting: Family Medicine

## 2024-10-20 DIAGNOSIS — Z1231 Encounter for screening mammogram for malignant neoplasm of breast: Secondary | ICD-10-CM | POA: Diagnosis present

## 2024-12-10 ENCOUNTER — Other Ambulatory Visit: Payer: Self-pay | Admitting: Internal Medicine

## 2024-12-17 ENCOUNTER — Other Ambulatory Visit (INDEPENDENT_AMBULATORY_CARE_PROVIDER_SITE_OTHER): Payer: Self-pay | Admitting: Nurse Practitioner

## 2024-12-19 ENCOUNTER — Emergency Department

## 2024-12-19 ENCOUNTER — Other Ambulatory Visit: Payer: Self-pay

## 2024-12-19 ENCOUNTER — Inpatient Hospital Stay
Admission: EM | Admit: 2024-12-19 | Discharge: 2024-12-22 | DRG: 897 | Disposition: A | Discharge status: Home Health | Attending: Obstetrics and Gynecology | Admitting: Obstetrics and Gynecology

## 2024-12-19 DIAGNOSIS — E870 Hyperosmolality and hypernatremia: Secondary | ICD-10-CM | POA: Diagnosis present

## 2024-12-19 DIAGNOSIS — C50411 Malignant neoplasm of upper-outer quadrant of right female breast: Secondary | ICD-10-CM

## 2024-12-19 DIAGNOSIS — Z923 Personal history of irradiation: Secondary | ICD-10-CM

## 2024-12-19 DIAGNOSIS — I5032 Chronic diastolic (congestive) heart failure: Secondary | ICD-10-CM | POA: Diagnosis present

## 2024-12-19 DIAGNOSIS — Z1152 Encounter for screening for COVID-19: Secondary | ICD-10-CM

## 2024-12-19 DIAGNOSIS — Z820 Family history of epilepsy and other diseases of the nervous system: Secondary | ICD-10-CM

## 2024-12-19 DIAGNOSIS — R7989 Other specified abnormal findings of blood chemistry: Secondary | ICD-10-CM

## 2024-12-19 DIAGNOSIS — R0902 Hypoxemia: Secondary | ICD-10-CM | POA: Diagnosis present

## 2024-12-19 DIAGNOSIS — F17210 Nicotine dependence, cigarettes, uncomplicated: Secondary | ICD-10-CM | POA: Diagnosis present

## 2024-12-19 DIAGNOSIS — Z87898 Personal history of other specified conditions: Secondary | ICD-10-CM

## 2024-12-19 DIAGNOSIS — J90 Pleural effusion, not elsewhere classified: Secondary | ICD-10-CM

## 2024-12-19 DIAGNOSIS — I10 Essential (primary) hypertension: Secondary | ICD-10-CM | POA: Diagnosis present

## 2024-12-19 DIAGNOSIS — F329 Major depressive disorder, single episode, unspecified: Secondary | ICD-10-CM | POA: Diagnosis present

## 2024-12-19 DIAGNOSIS — E1122 Type 2 diabetes mellitus with diabetic chronic kidney disease: Secondary | ICD-10-CM | POA: Diagnosis present

## 2024-12-19 DIAGNOSIS — E785 Hyperlipidemia, unspecified: Secondary | ICD-10-CM | POA: Diagnosis present

## 2024-12-19 DIAGNOSIS — F17200 Nicotine dependence, unspecified, uncomplicated: Secondary | ICD-10-CM | POA: Diagnosis present

## 2024-12-19 DIAGNOSIS — F1290 Cannabis use, unspecified, uncomplicated: Secondary | ICD-10-CM | POA: Diagnosis present

## 2024-12-19 DIAGNOSIS — I130 Hypertensive heart and chronic kidney disease with heart failure and stage 1 through stage 4 chronic kidney disease, or unspecified chronic kidney disease: Secondary | ICD-10-CM | POA: Diagnosis present

## 2024-12-19 DIAGNOSIS — F03911 Unspecified dementia, unspecified severity, with agitation: Secondary | ICD-10-CM | POA: Diagnosis present

## 2024-12-19 DIAGNOSIS — F10231 Alcohol dependence with withdrawal delirium: Principal | ICD-10-CM | POA: Diagnosis present

## 2024-12-19 DIAGNOSIS — D696 Thrombocytopenia, unspecified: Secondary | ICD-10-CM | POA: Diagnosis present

## 2024-12-19 DIAGNOSIS — F0393 Unspecified dementia, unspecified severity, with mood disturbance: Secondary | ICD-10-CM | POA: Diagnosis present

## 2024-12-19 DIAGNOSIS — Z8249 Family history of ischemic heart disease and other diseases of the circulatory system: Secondary | ICD-10-CM

## 2024-12-19 DIAGNOSIS — Z880 Allergy status to penicillin: Secondary | ICD-10-CM

## 2024-12-19 DIAGNOSIS — Z888 Allergy status to other drugs, medicaments and biological substances status: Secondary | ICD-10-CM

## 2024-12-19 DIAGNOSIS — I739 Peripheral vascular disease, unspecified: Secondary | ICD-10-CM | POA: Diagnosis present

## 2024-12-19 DIAGNOSIS — Z811 Family history of alcohol abuse and dependence: Secondary | ICD-10-CM

## 2024-12-19 DIAGNOSIS — Z79899 Other long term (current) drug therapy: Secondary | ICD-10-CM

## 2024-12-19 DIAGNOSIS — J9811 Atelectasis: Secondary | ICD-10-CM | POA: Diagnosis present

## 2024-12-19 DIAGNOSIS — J441 Chronic obstructive pulmonary disease with (acute) exacerbation: Secondary | ICD-10-CM | POA: Diagnosis present

## 2024-12-19 DIAGNOSIS — R569 Unspecified convulsions: Secondary | ICD-10-CM | POA: Diagnosis present

## 2024-12-19 DIAGNOSIS — M1990 Unspecified osteoarthritis, unspecified site: Secondary | ICD-10-CM | POA: Diagnosis present

## 2024-12-19 DIAGNOSIS — G9340 Encephalopathy, unspecified: Secondary | ICD-10-CM | POA: Diagnosis present

## 2024-12-19 DIAGNOSIS — Z881 Allergy status to other antibiotic agents status: Secondary | ICD-10-CM

## 2024-12-19 DIAGNOSIS — K219 Gastro-esophageal reflux disease without esophagitis: Secondary | ICD-10-CM | POA: Diagnosis present

## 2024-12-19 DIAGNOSIS — R4189 Other symptoms and signs involving cognitive functions and awareness: Secondary | ICD-10-CM | POA: Diagnosis present

## 2024-12-19 DIAGNOSIS — Z91048 Other nonmedicinal substance allergy status: Secondary | ICD-10-CM

## 2024-12-19 DIAGNOSIS — Z8619 Personal history of other infectious and parasitic diseases: Secondary | ICD-10-CM

## 2024-12-19 DIAGNOSIS — Z885 Allergy status to narcotic agent status: Secondary | ICD-10-CM

## 2024-12-19 DIAGNOSIS — Z853 Personal history of malignant neoplasm of breast: Secondary | ICD-10-CM

## 2024-12-19 DIAGNOSIS — J439 Emphysema, unspecified: Secondary | ICD-10-CM | POA: Diagnosis present

## 2024-12-19 DIAGNOSIS — Z95828 Presence of other vascular implants and grafts: Secondary | ICD-10-CM

## 2024-12-19 DIAGNOSIS — E1151 Type 2 diabetes mellitus with diabetic peripheral angiopathy without gangrene: Secondary | ICD-10-CM | POA: Diagnosis present

## 2024-12-19 DIAGNOSIS — Z833 Family history of diabetes mellitus: Secondary | ICD-10-CM

## 2024-12-19 DIAGNOSIS — Z9049 Acquired absence of other specified parts of digestive tract: Secondary | ICD-10-CM

## 2024-12-19 DIAGNOSIS — Z9889 Other specified postprocedural states: Secondary | ICD-10-CM

## 2024-12-19 DIAGNOSIS — I2510 Atherosclerotic heart disease of native coronary artery without angina pectoris: Secondary | ICD-10-CM | POA: Diagnosis present

## 2024-12-19 DIAGNOSIS — Z90710 Acquired absence of both cervix and uterus: Secondary | ICD-10-CM

## 2024-12-19 DIAGNOSIS — Z83438 Family history of other disorder of lipoprotein metabolism and other lipidemia: Secondary | ICD-10-CM

## 2024-12-19 DIAGNOSIS — N1831 Chronic kidney disease, stage 3a: Secondary | ICD-10-CM | POA: Diagnosis present

## 2024-12-19 DIAGNOSIS — Z79811 Long term (current) use of aromatase inhibitors: Secondary | ICD-10-CM

## 2024-12-19 DIAGNOSIS — J449 Chronic obstructive pulmonary disease, unspecified: Secondary | ICD-10-CM | POA: Diagnosis present

## 2024-12-19 DIAGNOSIS — Z7951 Long term (current) use of inhaled steroids: Secondary | ICD-10-CM

## 2024-12-19 DIAGNOSIS — R5381 Other malaise: Secondary | ICD-10-CM | POA: Diagnosis present

## 2024-12-19 DIAGNOSIS — E559 Vitamin D deficiency, unspecified: Secondary | ICD-10-CM | POA: Diagnosis present

## 2024-12-19 DIAGNOSIS — L905 Scar conditions and fibrosis of skin: Secondary | ICD-10-CM | POA: Diagnosis present

## 2024-12-19 DIAGNOSIS — R4182 Altered mental status, unspecified: Principal | ICD-10-CM

## 2024-12-19 DIAGNOSIS — Z818 Family history of other mental and behavioral disorders: Secondary | ICD-10-CM

## 2024-12-19 DIAGNOSIS — Z7902 Long term (current) use of antithrombotics/antiplatelets: Secondary | ICD-10-CM

## 2024-12-19 DIAGNOSIS — Z9011 Acquired absence of right breast and nipple: Secondary | ICD-10-CM

## 2024-12-19 DIAGNOSIS — K589 Irritable bowel syndrome without diarrhea: Secondary | ICD-10-CM | POA: Diagnosis present

## 2024-12-19 DIAGNOSIS — G9349 Other encephalopathy: Principal | ICD-10-CM | POA: Diagnosis present

## 2024-12-19 DIAGNOSIS — R0689 Other abnormalities of breathing: Secondary | ICD-10-CM | POA: Diagnosis present

## 2024-12-19 DIAGNOSIS — Z7901 Long term (current) use of anticoagulants: Secondary | ICD-10-CM

## 2024-12-19 DIAGNOSIS — E1140 Type 2 diabetes mellitus with diabetic neuropathy, unspecified: Secondary | ICD-10-CM | POA: Diagnosis present

## 2024-12-19 DIAGNOSIS — I6523 Occlusion and stenosis of bilateral carotid arteries: Secondary | ICD-10-CM | POA: Diagnosis present

## 2024-12-19 DIAGNOSIS — F1090 Alcohol use, unspecified, uncomplicated: Secondary | ICD-10-CM | POA: Diagnosis present

## 2024-12-19 LAB — ETHANOL: Alcohol, Ethyl (B): <15 mg/dL

## 2024-12-19 LAB — RESP PANEL BY RT-PCR (RSV, FLU A&B, COVID)  RVPGX2
Influenza A by PCR: NEGATIVE
Influenza B by PCR: NEGATIVE
Resp Syncytial Virus by PCR: NEGATIVE
SARS Coronavirus 2 by RT PCR: NEGATIVE

## 2024-12-19 LAB — CBC WITH DIFFERENTIAL/PLATELET
Abs Immature Granulocytes: 0.01 K/uL (ref 0.00–0.07)
Basophils Absolute: 0 K/uL (ref 0.0–0.1)
Basophils Relative: 0 %
Eosinophils Absolute: 0 K/uL (ref 0.0–0.5)
Eosinophils Relative: 1 %
HCT: 47.2 % — ABNORMAL HIGH (ref 36.0–46.0)
Hemoglobin: 15.3 g/dL — ABNORMAL HIGH (ref 12.0–15.0)
Immature Granulocytes: 0 %
Lymphocytes Relative: 18 %
Lymphs Abs: 0.8 K/uL (ref 0.7–4.0)
MCH: 30.1 pg (ref 26.0–34.0)
MCHC: 32.4 g/dL (ref 30.0–36.0)
MCV: 92.9 fL (ref 80.0–100.0)
Monocytes Absolute: 0.3 K/uL (ref 0.1–1.0)
Monocytes Relative: 6 %
Neutro Abs: 3.4 K/uL (ref 1.7–7.7)
Neutrophils Relative %: 75 %
Platelets: 147 K/uL — ABNORMAL LOW (ref 150–400)
RBC: 5.08 MIL/uL (ref 3.87–5.11)
RDW: 15.8 % — ABNORMAL HIGH (ref 11.5–15.5)
WBC: 4.6 K/uL (ref 4.0–10.5)
nRBC: 0 % (ref 0.0–0.2)

## 2024-12-19 LAB — COMPREHENSIVE METABOLIC PANEL WITH GFR
ALT: 34 U/L (ref 0–44)
AST: 25 U/L (ref 15–41)
Albumin: 4.5 g/dL (ref 3.5–5.0)
Alkaline Phosphatase: 68 U/L (ref 38–126)
Anion gap: 9 (ref 5–15)
BUN: 19 mg/dL (ref 8–23)
CO2: 30 mmol/L (ref 22–32)
Calcium: 9.6 mg/dL (ref 8.9–10.3)
Chloride: 106 mmol/L (ref 98–111)
Creatinine, Ser: 0.91 mg/dL (ref 0.44–1.00)
GFR, Estimated: >60 mL/min
Glucose, Bld: 128 mg/dL — ABNORMAL HIGH (ref 70–99)
Potassium: 4.6 mmol/L (ref 3.5–5.1)
Sodium: 144 mmol/L (ref 135–145)
Total Bilirubin: 0.3 mg/dL (ref 0.0–1.2)
Total Protein: 7.3 g/dL (ref 6.5–8.1)

## 2024-12-19 LAB — URINALYSIS, W/ REFLEX TO CULTURE (INFECTION SUSPECTED)
Bacteria, UA: NONE SEEN
Bilirubin Urine: NEGATIVE
Glucose, UA: NEGATIVE mg/dL
Hgb urine dipstick: NEGATIVE
Ketones, ur: NEGATIVE mg/dL
Leukocytes,Ua: NEGATIVE
Nitrite: NEGATIVE
Protein, ur: NEGATIVE mg/dL
Specific Gravity, Urine: 1.003 — ABNORMAL LOW (ref 1.005–1.030)
Squamous Epithelial / HPF: 0 /HPF (ref 0–5)
pH: 6 (ref 5.0–8.0)

## 2024-12-19 LAB — BLOOD GAS, VENOUS
Acid-Base Excess: 3.2 mmol/L — ABNORMAL HIGH (ref 0.0–2.0)
Bicarbonate: 31.5 mmol/L — ABNORMAL HIGH (ref 20.0–28.0)
O2 Saturation: 67.9 %
Patient temperature: 37
pCO2, Ven: 64 mmHg — ABNORMAL HIGH (ref 44–60)
pH, Ven: 7.3 (ref 7.25–7.43)
pO2, Ven: 39 mmHg (ref 32–45)

## 2024-12-19 LAB — TROPONIN T, HIGH SENSITIVITY
Troponin T High Sensitivity: 16 ng/L (ref 0–19)
Troponin T High Sensitivity: 17 ng/L (ref 0–19)

## 2024-12-19 LAB — MAGNESIUM: Magnesium: 2.2 mg/dL (ref 1.7–2.4)

## 2024-12-19 LAB — PRO BRAIN NATRIURETIC PEPTIDE: Pro Brain Natriuretic Peptide: 749 pg/mL — ABNORMAL HIGH

## 2024-12-19 LAB — AMMONIA: Ammonia: 45 umol/L — ABNORMAL HIGH (ref 9–35)

## 2024-12-19 LAB — CBG MONITORING, ED: Glucose-Capillary: 159 mg/dL — ABNORMAL HIGH (ref 70–99)

## 2024-12-19 MED ORDER — ACETAMINOPHEN 325 MG PO TABS: 650.0000 mg | ORAL_TABLET | Freq: Four times a day (QID) | ORAL | Status: DC | PRN

## 2024-12-19 MED ORDER — SODIUM CHLORIDE 0.9% FLUSH
3.0000 mL | Freq: Two times a day (BID) | INTRAVENOUS | Status: DC
  Administered 2024-12-19 – 2024-12-22 (×6): 3 mL via INTRAVENOUS

## 2024-12-19 MED ORDER — METHYLPREDNISOLONE SODIUM SUCC 125 MG IJ SOLR
125.0000 mg | Freq: Once | INTRAMUSCULAR | Status: AC
  Administered 2024-12-19: 125 mg via INTRAVENOUS
  Filled 2024-12-19: qty 2

## 2024-12-19 MED ORDER — FUROSEMIDE 10 MG/ML IJ SOLN
40.0000 mg | Freq: Once | INTRAMUSCULAR | Status: AC
  Administered 2024-12-19: 40 mg via INTRAVENOUS
  Filled 2024-12-19: qty 4

## 2024-12-19 MED ORDER — ONDANSETRON HCL 4 MG/2ML IJ SOLN: 4.0000 mg | Freq: Four times a day (QID) | INTRAMUSCULAR | Status: DC | PRN

## 2024-12-19 MED ORDER — INSULIN ASPART 100 UNIT/ML IJ SOLN: 0.0000 [IU] | Freq: Every day | INTRAMUSCULAR | Status: DC

## 2024-12-19 MED ORDER — IOHEXOL 350 MG/ML SOLN
75.0000 mL | Freq: Once | INTRAVENOUS | Status: AC | PRN
  Administered 2024-12-19: 75 mL via INTRAVENOUS

## 2024-12-19 MED ORDER — INSULIN ASPART 100 UNIT/ML IJ SOLN
0.0000 [IU] | Freq: Three times a day (TID) | INTRAMUSCULAR | Status: DC
  Administered 2024-12-20: 1 [IU] via SUBCUTANEOUS
  Filled 2024-12-19: qty 1

## 2024-12-19 MED ORDER — IPRATROPIUM-ALBUTEROL 0.5-2.5 (3) MG/3ML IN SOLN
3.0000 mL | Freq: Once | RESPIRATORY_TRACT | Status: AC
  Administered 2024-12-19: 3 mL via RESPIRATORY_TRACT
  Filled 2024-12-19: qty 3

## 2024-12-19 MED ORDER — PREDNISONE 20 MG PO TABS
40.0000 mg | ORAL_TABLET | Freq: Every day | ORAL | Status: DC
  Administered 2024-12-20: 40 mg via ORAL
  Filled 2024-12-19: qty 2

## 2024-12-19 MED ORDER — ACETAMINOPHEN 650 MG RE SUPP: 650.0000 mg | Freq: Four times a day (QID) | RECTAL | Status: DC | PRN

## 2024-12-19 MED ORDER — SENNOSIDES-DOCUSATE SODIUM 8.6-50 MG PO TABS: 1.0000 | ORAL_TABLET | Freq: Every evening | ORAL | Status: DC | PRN

## 2024-12-19 MED ORDER — ONDANSETRON HCL 4 MG PO TABS: 4.0000 mg | ORAL_TABLET | Freq: Four times a day (QID) | ORAL | Status: DC | PRN

## 2024-12-19 MED ORDER — ENOXAPARIN SODIUM 30 MG/0.3ML IJ SOSY: 30.0000 mg | PREFILLED_SYRINGE | INTRAMUSCULAR | Status: DC

## 2024-12-19 MED ORDER — SODIUM CHLORIDE 0.9 % IV SOLN
100.0000 mg | Freq: Two times a day (BID) | INTRAVENOUS | Status: DC
  Administered 2024-12-19 – 2024-12-20 (×2): 100 mg via INTRAVENOUS
  Filled 2024-12-19 (×3): qty 100

## 2024-12-19 MED ORDER — ENOXAPARIN SODIUM 40 MG/0.4ML IJ SOSY
40.0000 mg | PREFILLED_SYRINGE | INTRAMUSCULAR | Status: DC
  Administered 2024-12-19 – 2024-12-21 (×3): 40 mg via SUBCUTANEOUS
  Filled 2024-12-19 (×3): qty 0.4

## 2024-12-19 NOTE — Progress Notes (Signed)
 PHARMACIST - PHYSICIAN COMMUNICATION  CONCERNING:  Enoxaparin  (Lovenox ) for DVT Prophylaxis    RECOMMENDATION: Patient was prescribed enoxaprin 30mg  q24 hours for VTE prophylaxis.   Filed Weights   12/19/24 0947  Weight: 70.5 kg (155 lb 6.8 oz)    Body mass index is 25.47 kg/m.  Estimated Creatinine Clearance: 54 mL/min (by C-G formula based on SCr of 0.91 mg/dL).   Patient is candidate for enoxaparin  40mg  every 24 hours based on CrCl >43ml/min and Weight >45kg  DESCRIPTION: Pharmacy has adjusted enoxaparin  dose per [facility] policy.  Patient is now receiving enoxaparin  40 mg every 24 hours   Estill CHRISTELLA Lutes, PharmD, BCPS Clinical Pharmacist 12/19/2024 12:55 PM

## 2024-12-19 NOTE — Telephone Encounter (Signed)
 We hadn't seen in over a year, she needs to be seen

## 2024-12-19 NOTE — H&P (Signed)
 " History and Physical    Barbara Contreras FMW:[id number] DOB: 1951/07/02 DOA: 12/19/2024  DOS: the patient was seen and examined on 12/19/2024  PCP: Eliverto Bette Hover, MD   Patient coming from: Home  I have personally briefly reviewed patient's old medical records in [facility] and CareEverywhere  HPI:   Barbara Contreras is a 74 y.o. year old female with medical history of hypertension, hyperlipidemia, type 2 diabetes, COPD, HFpEF (EF 55%, G2 DD) in 04/2024 presented to the ED after being found confused by family.  On exam patient noted to be confused.  Patient able to contribute to history.  Family is present at bedside stating confusion developed overnight and she was found in the AM confused. When they told her to go to bed, she went into the dog's bed to sleep. She was found with her pulse ox in the 70s.  On arrival to the ED patient was noted to be HDS stable.  Lab work and imaging obtained.  CBC without leukocytosis, mild thrombocytopenia which is new.  CMP unremarkable, ethanol level negative, VBG 7.3/64/39.  Troponin normal, BNP mildly elevated at 749.  UA without signs of infection.  CT head normal.  CTA PE study was performed that showed no PE but did show cardiomegaly along with soft tissue stranding and dermal thickening in the medial right breast concerning for scarring.  Given need for continued care, [facility] contacted for admission.  Review of Systems: As mentioned in the history of present illness. All other systems reviewed and are negative.   Past Medical History:  Diagnosis Date   Arthritis    Asthma    Back pain    Breast cancer (HCC) 2022   Carotid artery stenosis    Carotid stenosis    Coronary artery disease    Depression    Emphysema of lung (HCC)    GERD (gastroesophageal reflux disease)    Heart murmur    Hip pain    History of Lyme disease    HLD (hyperlipidemia)    Hyperlipemia    Hypertension    IBS (irritable bowel syndrome)    IBS (irritable  bowel syndrome)    Lyme disease    Personal history of radiation therapy    Pre-diabetes    Sciatica    Sciatica    Seizures (HCC)    Tremor    Vitamin D  deficiency    Vitamin D  deficiency     Past Surgical History:  Procedure Laterality Date   ABDOMINAL HYSTERECTOMY     BREAST BIOPSY Right 07/22/2021   us  bx 1:00 area, venus marker, IMC tubular features   BREAST BIOPSY Right 07/22/2021   us  bx 2:00 ribbon marker, fibroadenoma   BREAST LUMPECTOMY Right 08/24/2021   re-excision   BREAST LUMPECTOMY Right 08/12/2021   CAROTID ENDARTERECTOMY Right    CAROTID PTA/STENT INTERVENTION Left 01/25/2023   Procedure: CAROTID PTA/STENT INTERVENTION;  Surgeon: Marea Selinda RAMAN, MD;  Location: [facility] INVASIVE CV LAB;  Service: Cardiovascular;  Laterality: Left;   CHOLECYSTECTOMY     COLONOSCOPY WITH PROPOFOL  N/A 09/13/2018   Procedure: COLONOSCOPY WITH PROPOFOL ;  Surgeon: Gaylyn Gladis PENNER, MD;  Location: [facility] ENDOSCOPY;  Service: Endoscopy;  Laterality: N/A;   COLONOSCOPY WITH PROPOFOL  N/A 08/17/2023   Procedure: COLONOSCOPY WITH PROPOFOL ;  Surgeon: Maryruth Ole DASEN, MD;  Location: [facility] ENDOSCOPY;  Service: Endoscopy;  Laterality: N/A;   ESOPHAGOGASTRODUODENOSCOPY N/A 05/03/2021   Procedure: ESOPHAGOGASTRODUODENOSCOPY (EGD);  Surgeon: Maryruth Ole DASEN, MD;  Location: [facility]  ENDOSCOPY;  Service: Endoscopy;  Laterality: N/A;   HEMOSTASIS CLIP PLACEMENT  08/17/2023   Procedure: HEMOSTASIS CLIP PLACEMENT;  Surgeon: Maryruth Ole DASEN, MD;  Location: [facility] ENDOSCOPY;  Service: Endoscopy;;   paranasal sinusotomy     PART MASTECTOMY,RADIO FREQUENCY LOCALIZER,AXILLARY SENTINEL NODE BIOPSY Right 08/12/2021   Procedure: PART MASTECTOMY,RADIO FREQUENCY LOCALIZER,AXILLARY SENTINEL NODE BIOPSY;  Surgeon: Tye Millet, DO;  Location: [facility] ORS;  Service: General;  Laterality: Right;   POLYPECTOMY  08/17/2023   Procedure: POLYPECTOMY;  Surgeon: Maryruth Ole DASEN, MD;  Location: [facility] ENDOSCOPY;  Service:  Endoscopy;;   RE-EXCISION OF BREAST CANCER,SUPERIOR MARGINS Right 08/24/2021   Procedure: RE-EXCISION OF BREAST CANCER,SUPERIOR MARGINS;  Surgeon: Tye Millet, DO;  Location: [facility] ORS;  Service: General;  Laterality: Right;   SUBMUCOSAL LIFTING INJECTION  08/17/2023   Procedure: SUBMUCOSAL LIFTING INJECTION;  Surgeon: Maryruth Ole DASEN, MD;  Location: [facility] ENDOSCOPY;  Service: Endoscopy;;     Allergies[1]  Family History  Problem Relation Age of Onset   Alcohol abuse Father    Alzheimer's disease Father    Anxiety disorder Father    Depression Father    Alcohol abuse Mother    CAD Mother    Diabetes Mother    Hyperlipidemia Mother    Depression Mother     Prior to Admission medications  Medication Sig Start Date End Date Taking? Authorizing Provider  amLODipine  (NORVASC ) 10 MG tablet Take 1 tablet (10 mg total) by mouth daily. 04/30/24 04/30/25  Von Bellis, MD  anastrozole  (ARIMIDEX ) 1 MG tablet TAKE 1 TABLET BY MOUTH EVERY DAY 12/10/24   Brahmanday, Govinda R, MD  baclofen  (LIORESAL ) 10 MG tablet Take 5-10 mg by mouth at bedtime as needed for muscle spasms. 06/08/21   [provider]  calcium  carbonate (TUMS EX) 750 MG chewable tablet Chew 1 tablet by mouth daily as needed for heartburn.    [provider]  Cholecalciferol  (VITAMIN D3) 10 MCG (400 UNIT) tablet Take 400 Units by mouth daily.    [provider]  clobetasol  (TEMOVATE ) 0.05 % external solution Apply 1 Application topically daily. 09/18/22   [provider]  clopidogrel  (PLAVIX ) 75 MG tablet TAKE 1 TABLET BY MOUTH EVERY DAY 10/17/24   Brown, Fallon E, NP  dicyclomine  (BENTYL ) 10 MG capsule Take 10 mg by mouth in the morning and at bedtime.    [provider]  docusate sodium  (COLACE) 100 MG capsule Take 100 mg by mouth 2 (two) times daily. 08/24/21   [provider]  ELIQUIS  5 MG TABS tablet Take 5 mg by mouth 2 (two) times daily.    [provider]   ezetimibe  (ZETIA ) 10 MG tablet Take 1 tablet by mouth daily. 04/15/24   [provider]  famotidine  (PEPCID ) 20 MG tablet Take 20 mg by mouth 2 (two) times daily. 07/17/22   [provider]  fluticasone  (FLONASE ) 50 MCG/ACT nasal spray Place 1 spray into both nostrils daily. 05/03/23   [provider]  fluticasone -salmeterol (WIXELA INHUB) 250-50 MCG/ACT AEPB Inhale 1 puff into the lungs in the morning and at bedtime.    [provider]  gabapentin  (NEURONTIN ) 600 MG tablet Take 600 mg by mouth 3 (three) times daily. 01/24/16   [provider]  iron  polysaccharides (NIFEREX) 150 MG capsule Take 1 capsule (150 mg total) by mouth daily. 04/30/24 09/08/24  Von Bellis, MD  ketoconazole  (NIZORAL ) 2 % shampoo Apply 1 application topically once a week.    [provider]  metoprolol   succinate (TOPROL -XL) 25 MG 24 hr tablet Take 1 tablet by mouth daily. 04/08/24 04/08/25  [provider]  montelukast  (SINGULAIR ) 10 MG tablet Take 1 tablet by mouth at bedtime. 04/21/24   [provider]  Multiple Vitamin (MULTIVITAMIN WITH MINERALS) TABS tablet Take 1 tablet by mouth daily.    [provider]  pantoprazole  (PROTONIX ) 40 MG tablet Take 1 tablet (40 mg total) by mouth at bedtime as needed. 04/30/24 09/08/24  Von Bellis, MD  REPATHA SURECLICK 140 MG/ML SOAJ Inject 140 mg into the skin every 14 (fourteen) days.    [provider]  tiotropium (SPIRIVA ) 18 MCG inhalation capsule Place 18 mcg into inhaler and inhale daily. 04/21/24   [provider]  traMADol  (ULTRAM ) 50 MG tablet Take 50 mg by mouth every 6 (six) hours as needed for moderate pain (pain score 4-6). 04/25/24   [provider]  venlafaxine  XR (EFFEXOR -XR) 75 MG 24 hr capsule Take 75 mg by mouth 3 (three) times daily.    [provider]  vitamin B-12 (CYANOCOBALAMIN ) 1000 MCG tablet Take 1,000 mcg by mouth daily.    [provider]     Social History:  reports that she has been smoking cigarettes. She has a 27.5 pack-year smoking history. She has never used smokeless tobacco. She reports current alcohol use. She reports current drug use. Drug: Marijuana.    Physical Exam: Vitals:   12/19/24 1051 12/19/24 1100 12/19/24 1300 12/19/24 1330  BP:  (!) 128/99 (!) 165/54 131/61  Pulse: 76 91 85 82  Resp: (!) 30 (!) 26 16 19   Temp:      TempSrc:      SpO2: 98% 94% 98% 93%  Weight:      Height:        Gen: NAD HENT: NCAT, [place] in place CV: RRR, systolic murmur present Lung: bibasilar rales Abd: No TTP, normal bowel sounds MSK: No asymmetry, good bulk and tone Neuro: somnolent but arouses to voice,  oriented x1   Labs on Admission: I have personally reviewed following labs and imaging studies  CBC: Recent Labs  Lab 12/19/24 1035  WBC 4.6  NEUTROABS 3.4  HGB 15.3*  HCT 47.2*  MCV 92.9  PLT 147*   Basic Metabolic Panel: Recent Labs  Lab 12/19/24 1035  NA 144  K 4.6  CL 106  CO2 30  GLUCOSE 128*  BUN 19  CREATININE 0.91  CALCIUM  9.6   GFR: Estimated Creatinine Clearance: 54 mL/min (by C-G formula based on SCr of 0.91 mg/dL). Liver Function Tests: Recent Labs  Lab 12/19/24 1035  AST 25  ALT 34  ALKPHOS 68  BILITOT 0.3  PROT 7.3  ALBUMIN 4.5   No results for input(s): LIPASE, AMYLASE in the last 168 hours. No results for input(s): AMMONIA in the last 168 hours. Coagulation Profile: No results for input(s): INR, PROTIME in the last 168 hours. Cardiac Enzymes: No results for input(s): CKTOTAL, CKMB, CKMBINDEX, TROPONINI, TROPONINIHS in the last 168 hours. BNP (last 3 results) Recent Labs    04/27/24 0425  BNP 901.6*   HbA1C: No results for input(s): HGBA1C in the last 72 hours. CBG: No results for input(s): GLUCAP in the last 168 hours. Lipid Profile: No results for input(s): CHOL, HDL, LDLCALC, TRIG, CHOLHDL, LDLDIRECT in the last 72  hours. Thyroid Function Tests: No results for input(s): TSH, T4TOTAL, FREET4, T3FREE, THYROIDAB in the last 72 hours. Anemia Panel: No results for input(s): VITAMINB12, FOLATE, FERRITIN, TIBC, IRON , RETICCTPCT  in the last 72 hours. Urine analysis:    Component Value Date/Time   COLORURINE COLORLESS (A) 12/19/2024 1115   APPEARANCEUR CLEAR (A) 12/19/2024 1115   LABSPEC 1.003 (L) 12/19/2024 1115   PHURINE 6.0 12/19/2024 1115   GLUCOSEU NEGATIVE 12/19/2024 1115   HGBUR NEGATIVE 12/19/2024 1115   BILIRUBINUR NEGATIVE 12/19/2024 1115   KETONESUR NEGATIVE 12/19/2024 1115   PROTEINUR NEGATIVE 12/19/2024 1115   NITRITE NEGATIVE 12/19/2024 1115   LEUKOCYTESUR NEGATIVE 12/19/2024 1115    Radiological Exams on Admission: I have personally reviewed images CT Angio Chest PE W/Cm &/Or Wo Cm Result Date: 12/19/2024 EXAM: CTA CHEST 12/19/2024 11:51:59 AM TECHNIQUE: CTA of the chest was performed after the administration of 75 mL of iohexol  (OMNIPAQUE ) 350 MG/ML injection. Multiplanar reformatted images are provided for review. MIP images are provided for review. Automated exposure control, iterative reconstruction, and/or weight based adjustment of the mA/kV was utilized to reduce the radiation dose to as low as reasonably achievable. COMPARISON: CT of the chest dated 06/20/2024. CLINICAL HISTORY: Pulmonary embolism (PE) suspected, high prob. FINDINGS: PULMONARY ARTERIES: Pulmonary arteries are adequately opacified for evaluation. No acute pulmonary embolus. Main pulmonary artery is normal in caliber. MEDIASTINUM: The heart is mildly enlarged. There is a mildly prominent left atrial appendage. The pericardium demonstrates no acute abnormality. The thoracic aorta demonstrates moderate calcific atheromatous disease. LYMPH NODES: No mediastinal, hilar or axillary lymphadenopathy. LUNGS AND PLEURA: There are ground glass opacities within the lungs bilaterally. There is mild dependent  atelectasis. There is a tiny right-sided pleural effusion. No pneumothorax. UPPER ABDOMEN: The patient is status post cholecystectomy. SOFT TISSUES AND BONES: There is soft tissue stranding/scarring present superiorly within the right breast. There is mild thickening of the dermis of the right breast present medially. No acute bone abnormality. IMPRESSION: 1. No pulmonary embolism. 2. Bilateral ground glass opacities and mild dependent atelectasis. 3. Tiny right-sided pleural effusion. 4. Mild cardiomegaly with mildly prominent left atrial appendage. 5. Moderate calcific atheromatous disease of the thoracic aorta. 6. Soft tissue stranding/scarring and mild dermal thickening in the medial right breast. Electronically signed by: Evalene Coho MD 12/19/2024 12:12 PM EST RP Workstation: [id number]   DG Chest 1 View Result Date: 12/19/2024 CLINICAL DATA:  Altered mental status EXAM: CHEST  1 VIEW COMPARISON:  Apr 27, 2024 FINDINGS: Stable cardiomediastinal silhouette. Minimal bibasilar subsegmental atelectasis or edema is noted. Bony thorax is unremarkable. IMPRESSION: Minimal bibasilar subsegmental atelectasis or edema. Electronically Signed   By: Lynwood Landy Raddle M.D.   On: 12/19/2024 10:21   CT Head Wo Contrast Result Date: 12/19/2024 EXAM: CT HEAD WITHOUT CONTRAST 12/19/2024 10:03:21 AM TECHNIQUE: CT of the head was performed without the administration of intravenous contrast. Automated exposure control, iterative reconstruction, and/or weight based adjustment of the mA/kV was utilized to reduce the radiation dose to as low as reasonably achievable. COMPARISON: CT of the head dated 10/01/2022. CLINICAL HISTORY: Mental status change, unknown cause; ams. FINDINGS: BRAIN AND VENTRICLES: No acute hemorrhage. No evidence of acute infarct. No hydrocephalus. No extra-axial collection. No mass effect or midline shift. Calcified atherosclerotic plaque in cavernous/supraclinoid ICA and intradural vertebral arteries.  ORBITS: No acute abnormality. SINUSES: No acute abnormality. SOFT TISSUES AND SKULL: No acute soft tissue abnormality. No skull fracture. IMPRESSION: 1. No acute intracranial abnormality. 2. Calcified atherosclerotic plaque in the cavernous/supraclinoid ICAs and intradural vertebral arteries. Electronically signed by: Evalene Coho MD 12/19/2024 10:12 AM EST RP Workstation: [id number]    EKG: My personal interpretation of EKG shows: Sinus rhythm  without any acute ST changes    Assessment/Plan Principal Problem:   Acute encephalopathy Active Problems:   HTN (hypertension)   HLD (hyperlipidemia)   Tobacco use disorder   Alcohol use disorder   GERD without esophagitis   PAD (peripheral artery disease)   COPD (chronic obstructive pulmonary disease) (HCC)   Pt presenting with acute encephalopathy. CBG normal. Etiology likely mediated by hypoxia and hypercapnia.  Given her COPD history she meets the cardinal symptoms of COPD exacerbation.  Will treat as below.   CTH without any acute findings. Utox ordered.  No concern for stroke given there are no focal deficits.  Will monitor for mental status as we correct underlying abnormalities and treat her COPD.  If mental status not improving can order MRI for further workup.  Ammonia ordered but very likely to be normal given normal hepatic.  COPD exacerbation: Will treat with steroids and doxycycline  as she is, able to take p.o.  Wean off oxygen as able.  CHF exacerbation: Mild.  Patient has normal BNP elevation and UA consistent with volume overload.  She is status post 40 mg IV Lasix .  This does not appear to be driving her presentation.  She had a recent echocardiogram will defer getting a repeat echocardiogram.  Will monitor on telemetry and obtain strict input and output.  Alcohol use disorder: Per family patient drinks daily before drink his beer and wine.  Last drink was yesterday evening.  As far as they are concerned she has not had any  alcohol withdrawals previously.  Will place on CIWA without Ativan  but if CIWA score is greater than 10 we will start treatment.  Tobacco use disorder: Patient continues to smoke per family.  Will need to discuss cessation as she has COPD and smoking can exacerbate COPD.  Smoking cessation more important given history of breast cancer undergoing surveillance for lung cancer and high risk for recurrence given history of breast cancer.  H. Pylori infection: Continue PPI therapy once it has been verified.  I am unable to locate this and family is unaware of the exact regimen.  Chronic Problems: Restart home meds once med reconciliation is completed and patient able to tolerate p.o. intake.  HTN: continue home meds HLD: continue home meds.  She is on given aspirin  and Plavix  her vascular history. GERD: continue home PPI History of breast cancer: Follows with oncology. On anastrozole . Will continue once pt is taking PO intake. Discussed with radiologist regarding dermal thickening and he states it is unchanged compared to CT in 06/2024. Pt needs continued surveillance MM.       VTE prophylaxis:  SQ Heparin   Diet: N.p.o. Code Status:  Full Code Telemetry:  Admission status: Observation, Progressive Patient is from: Home Anticipated d/c is to: Home Anticipated d/c is in: 1-2 days   Family Communication: Updated at bedside  Consults called: None   Severity of Illness: The appropriate patient status for this patient is OBSERVATION. Observation status is judged to be reasonable and necessary in order to provide the required intensity of service to ensure the patient's safety. The patient's presenting symptoms, physical exam findings, and initial radiographic and laboratory data in the context of their medical condition is felt to place them at decreased risk for further clinical deterioration. Furthermore, it is anticipated that the patient will be medically stable for discharge from the  hospital within 2 midnights of admission.    Morene Bathe, MD [facility]     [1]  Allergies Allergen Reactions   Morphine And Codeine Itching   Clindamycin Other (See Comments)   Erythromycin     Other reaction(s): Other (see comments)   Other     Other reaction(s): Other (See Comments) Redness and itching   Penicillins Other (See Comments)    TOLERATED CEFAZOLIN  PRIOR Reaction: unknown Has patient had a PCN reaction causing immediate rash, facial/tongue/throat swelling, SOB or lightheadedness with hypotension: Unknown Has patient had a PCN reaction causing severe rash involving mucus membranes or skin necrosis: Unknown Has patient had a PCN reaction that required hospitalization: Unknown Has patient had a PCN reaction occurring within the last 10 years: no If all of the above answers are NO, then may proceed with Cephalosporin use.   Promethazine-Phenylephrine  Other (See Comments)    Muscle spasm   Tape Other (See Comments)    Redness and itching   "

## 2024-12-19 NOTE — ED Notes (Signed)
 In and out performed by this clinical research associate and Vet, RN, pt tolerated well. Pt cleaned up and new brief placed.

## 2024-12-19 NOTE — ED Notes (Signed)
 Pt placed on 2L of oxygen to bring saturation up to 91%.

## 2024-12-19 NOTE — Evaluation (Signed)
 Occupational Therapy Evaluation Patient Details Name: Barbara Contreras MRN: [id number] DOB: July 29, 1951 Today's Date: 12/19/2024   History of Present Illness   74 y.o. female with history of GERD, hypertension, hyperlipidemia, IBS, alcohol abuse, recent h pylori infection presenting with altered mental status and unresponsiveness after drinking alcohol and attempting to sleep in her dog's bed last night     Clinical Impressions Pt was seen for OT evaluation this date. Pt's niece is present in ED room on entry and provides all history. Pt lives with her sister in a one level home with 5 STE and bil HR. At baseline, pt is indep with all tasks, driving and community mobile without AD use. Pt presents with deficits in alertness, orientation, direction following and generally is incoherent during session, unable to state who her niece is or that she is in the hospital. Pt is pulling at lines/leads and ripping her brief off. Noted with tremors, all VSS during session on 2L. Max A to reach long sitting on ED gurney and to donn hospital socks, although pt did demo ability to adjust the tops of her socks while seated. CGA provided for safety/balance and pt required Max A to return to supine and scoot to [facility]. Replaced purewick and brief in proper position. Pt answered some questions with coherent speech, but also mumbles nonsensically during session. Pt is far from her baseline function at this time, will cont to follow in hopes of mental status improving and better assessment of function. At this time, do anticipate the need for follow up OT services upon acute hospital DC.        If plan is discharge home, recommend the following:   A lot of help with walking and/or transfers;A lot of help with bathing/dressing/bathroom;Direct supervision/assist for medications management;Supervision due to cognitive status;Direct supervision/assist for financial management;Assistance with cooking/housework;Help with  stairs or ramp for entrance     Functional Status Assessment   Patient has had a recent decline in their functional status and demonstrates the ability to make significant improvements in function in a reasonable and predictable amount of time.     Equipment Recommendations         Recommendations for Other Services         Precautions/Restrictions         Mobility Bed Mobility Overal bed mobility: Needs Assistance Bed Mobility: Supine to Sit     Supine to sit: Max assist, Mod assist     General bed mobility comments: Mod/Max A to bring trunk upright and return to supine position; pt able to maintain seated balance with Min/CGA on ED gurney    Transfers                   General transfer comment: deferred standing d/t poor seated tolerance and inability to follow commands      Balance Overall balance assessment: Needs assistance Sitting-balance support: Feet unsupported, Bilateral upper extremity supported Sitting balance-Leahy Scale: Poor                                     ADL either performed or assessed with clinical judgement   ADL Overall ADL's : Needs assistance/impaired                     Lower Body Dressing: Maximal assistance;Bed level Lower Body Dressing Details (indicate cue type and reason): donned bil socks while in  long sitting, pt able to adjust socks while seated with supervision     Toileting- Clothing Manipulation and Hygiene: Maximal assistance;Bed level Toileting - Clothing Manipulation Details (indicate cue type and reason): pt ripping brief off, OT repositioned and replaced purewick during session             Vision         Perception         Praxis         Pertinent Vitals/Pain Pain Assessment Pain Assessment: PAINAD Breathing: normal Negative Vocalization: none Facial Expression: smiling or inexpressive Body Language: tense, distressed pacing, fidgeting Consolability:  distracted or reassured by voice/touch PAINAD Score: 2 Pain Intervention(s): Monitored during session     Extremity/Trunk Assessment Upper Extremity Assessment Upper Extremity Assessment: Overall WFL for tasks assessed   Lower Extremity Assessment Lower Extremity Assessment: Generalized weakness;Defer to PT evaluation       Communication     Cognition Arousal: Lethargic Behavior During Therapy: Impulsive, Anxious, Restless Cognition: Cognition impaired   Orientation impairments: Person, Place, Time, Situation                           Following commands: Impaired Following commands impaired: Follows one step commands inconsistently     Cueing  General Comments   Cueing Techniques: Verbal cues;Gestural cues;Tactile cues  VSS during session on 2l 02, pt states she wears it all the time, but niece states she thinks she may only wear it at night   Exercises     Shoulder Instructions      Home Living Family/patient expects to be discharged to:: Private residence Living Arrangements: Other relatives (sister) Available Help at Discharge: Family;Available 24 hours/day Type of Home: House Home Access: Stairs to enter [employer] of Steps: 5 onto the deck Entrance Stairs-Rails: Right;Left;Can reach both Home Layout: One level     Bathroom Shower/Tub: Chief Strategy Officer: Standard                Prior Functioning/Environment Prior Level of Function : Independent/Modified Independent;Driving             Mobility Comments: no AD at baseline ADLs Comments: indep with ADLs, IADLs, goes to the grocery store    OT Problem List: Decreased strength;Decreased cognition;Decreased activity tolerance;Impaired balance (sitting and/or standing)   OT Treatment/Interventions: Self-care/ADL training;Therapeutic exercise;Therapeutic activities;Energy conservation;Patient/family education;DME and/or AE instruction;Balance training       OT Goals(Current goals can be found in the care plan section)   Acute Rehab OT Goals OT Goal Formulation: Patient unable to participate in goal setting Time For Goal Achievement: 01/02/25 Potential to Achieve Goals: Fair ADL Goals Pt Will Perform Grooming: with set-up;sitting Pt Will Perform Lower Body Dressing: with contact guard assist;with supervision;sitting/lateral leans;sit to/from stand Pt Will Transfer to Toilet: with contact guard assist;with supervision;ambulating   OT Frequency:  Min 2X/week    Co-evaluation              AM-PAC OT 6 Clicks Daily Activity     Outcome Measure Help from another person eating meals?: A Little Help from another person taking care of personal grooming?: A Little Help from another person toileting, which includes using toliet, bedpan, or urinal?: A Lot Help from another person bathing (including washing, rinsing, drying)?: A Lot Help from another person to put on and taking off regular upper body clothing?: A Little Help from another person to put on and taking off  regular lower body clothing?: A Lot 6 Click Score: 15   End of Session Equipment Utilized During Treatment: Oxygen Nurse Communication: Mobility status  Activity Tolerance: Patient limited by lethargy Patient left: in bed;with call bell/phone within reach;with family/visitor present  OT Visit Diagnosis: Other abnormalities of gait and mobility (R26.89);Muscle weakness (generalized) (M62.81)                Time: 8391-8366 OT Time Calculation (min): 25 min Charges:  OT General Charges $OT Visit: 1 Visit OT Evaluation $OT Eval Low Complexity: 1 Low OT Treatments $Self Care/Home Management : 8-22 mins  Floreen Teegarden Chrismon, OTR/L 12/19/24, 4:50 PM  Molley Houser E Chrismon 12/19/2024, 4:47 PM

## 2024-12-19 NOTE — ED Provider Notes (Signed)
 [facility] Provider Note    Event Date/Time   First MD Initiated Contact with Patient 12/19/24 [phone]     (approximate)   History   Altered Mental Status   HPI  Barbara Contreras is a 74 y.o. female with history of GERD, hypertension, hyperlipidemia, IBS, alcohol abuse, presenting with altered mental status.  Per independent history from EMS, she was last seen normal yesterday at around 11 PM last night.  States that she was altered, tried to go all into the dog bit to sleep.  Was found lying in a bed unresponsive.  Per them her vitals were stable.  No external signs of trauma.  Does have history of alcohol abuse.  Patient denies any complaints at this time.  States that she wants to get out of bed.  She denies any chest pain or abdominal pain, no recent falls or trauma, no fever, no focal weakness or numbness.  On independent review, she was seen in October by primary care, history of breast cancer status postlumpectomy and radiation, is on anastrozole  since January 2023.  Does have history of COPD and CHF with recent hospitalization for that.  Per independent history from daughter, states that sister was at home with mom, patient did not drink much alcohol last night was intoxicated.  They found her walking around undressed, told her that she was going to bed and went to sleep the dog bed.  They got her to her bed to sleep.  States that she is on oxygen as needed.  Was not wearing her oxygen during this incident.     Physical Exam   Triage Vital Signs: ED Triage Vitals  Encounter Vitals Group     BP      Girls Systolic BP Percentile      Girls Diastolic BP Percentile      Boys Systolic BP Percentile      Boys Diastolic BP Percentile      Pulse      Resp      Temp      Temp src      SpO2      Weight      Height      Head Circumference      Peak Flow      Pain Score      Pain Loc      Pain Education      Exclude from Growth Chart     Most  recent vital signs: Vitals:   12/19/24 1051 12/19/24 1100  BP:  (!) 128/99  Pulse: 76 91  Resp: (!) 30 (!) 26  Temp:    SpO2: 98% 94%     General: Awake, no distress.  CV:  Good peripheral perfusion.  Resp:  Normal effort.  No tachypnea or respiratory distress, lungs are clear without wheezing Abd:  No distention.  Soft nontender Other:  Also equal, extraocular moods are intact, no facial droop, she is moving all 4 extremity spontaneously without focal weakness or numbness.   ED Results / Procedures / Treatments   Labs (all labs ordered are listed, but only abnormal results are displayed) Labs Reviewed  COMPREHENSIVE METABOLIC PANEL WITH GFR - Abnormal; Notable for the following components:      Result Value   Glucose, Bld 128 (*)    All other components within normal limits  CBC WITH DIFFERENTIAL/PLATELET - Abnormal; Notable for the following components:   Hemoglobin 15.3 (*)    HCT 47.2 (*)  RDW 15.8 (*)    Platelets 147 (*)    All other components within normal limits  URINALYSIS, W/ REFLEX TO CULTURE (INFECTION SUSPECTED) - Abnormal; Notable for the following components:   Color, Urine COLORLESS (*)    APPearance CLEAR (*)    Specific Gravity, Urine 1.003 (*)    All other components within normal limits  BLOOD GAS, VENOUS - Abnormal; Notable for the following components:   pCO2, Ven 64 (*)    Bicarbonate 31.5 (*)    Acid-Base Excess 3.2 (*)    All other components within normal limits  PRO BRAIN NATRIURETIC PEPTIDE - Abnormal; Notable for the following components:   Pro Brain Natriuretic Peptide 749.0 (*)    All other components within normal limits  RESP PANEL BY RT-PCR (RSV, FLU A&B, COVID)  [id number]  ETHANOL  AMMONIA  TROPONIN T, HIGH SENSITIVITY  TROPONIN T, HIGH SENSITIVITY     EKG  EKG shows, sinus and heart rate 82, normal QS, normal QTc, no obvious ischemic ST elevation, T wave inversion to V4 through V6,   RADIOLOGY On my independent  interpretation, CT without obvious PE   PROCEDURES:  Critical Care performed: Yes, see critical care procedure note(s)  .Critical Care  Performed by: Waymond Lorelle Cummins, MD Authorized by: Waymond Lorelle Cummins, MD   Critical care provider statement:    Critical care time (minutes):  45   Critical care was time spent personally by me on the following activities:  Development of treatment plan with patient or surrogate, discussions with consultants, evaluation of patient's response to treatment, examination of patient, ordering and review of laboratory studies, ordering and review of radiographic studies, ordering and performing treatments and interventions, pulse oximetry, re-evaluation of patient's condition and review of old charts    MEDICATIONS ORDERED IN ED: Medications  iohexol  (OMNIPAQUE ) 350 MG/ML injection 75 mL (75 mLs Intravenous Contrast Given 12/19/24 1152)  furosemide  (LASIX ) injection 40 mg (40 mg Intravenous Given 12/19/24 1225)  ipratropium-albuterol  (DUONEB) 0.5-2.5 (3) MG/3ML nebulizer solution 3 mL (3 mLs Nebulization Given 12/19/24 1229)     IMPRESSION / MDM / ASSESSMENT AND PLAN / ED COURSE  I reviewed the triage vital signs and the nursing notes.                              Differential diagnosis includes, but is not limited to, alcohol intoxication, electrolyte derangements, intracranial hemorrhage, no focal deficits at this time to suggest CVA.  Did also consider UTI.  Will get labs, CT, ethanol levels.  Patient's presentation is most consistent with acute presentation with potential threat to life or bodily function.  Independent interpretation of labs and imaging below.  Given patient's altered mental status and hypoxia, she will need to be admitted for further management.  Consult hospitalist for admission.  Rest of clinical course as below.  She is admitted.    Clinical Course as of 12/19/24 1246  Fri Dec 19, 2024  1047 DG Chest 1 View Minimal bibasilar  subsegmental atelectasis or edema.  [TT]  1054 CT Head Wo Contrast IMPRESSION: 1. No acute intracranial abnormality. 2. Calcified atherosclerotic plaque in the cavernous/supraclinoid ICAs and intradural vertebral arteries.   [TT]  1112 Independent review of labs, electrolytes not severely, LFTs are normal, no leukocytosis, she is not acutely retaining CO2, ethanol levels are not elevated [TT]  1149 O2 would intermittently dip down to the 80s, snoring, wakes up easily, daughter say  that patient has history of snoring, has not been formally diagnosed with OSA but they think she needs a home cpap machine.  She is on oxygen at this time. [TT]  1202 Urinalysis, w/ Reflex to Culture (Infection Suspected) -Urine, Clean Catch(!) Not consistent with UTI [TT]  1214 CT Angio Chest PE W/Cm &/Or Wo Cm IMPRESSION: 1. No pulmonary embolism. 2. Bilateral ground glass opacities and mild dependent atelectasis. 3. Tiny right-sided pleural effusion. 4. Mild cardiomegaly with mildly prominent left atrial appendage. 5. Moderate calcific atheromatous disease of the thoracic aorta. 6. Soft tissue stranding/scarring and mild dermal thickening in the medial right breast.   [TT]  1214 Given small pleural effusion and mildly elevated BNP, did consider possible volume overload, will give her some IV Lasix  here. [TT]    Clinical Course User Index [TT] Waymond Lorelle Cummins, MD     FINAL CLINICAL IMPRESSION(S) / ED DIAGNOSES   Final diagnoses:  Altered mental status, unspecified altered mental status type  Hypoxia  History of alcohol use  Elevated brain natriuretic peptide (BNP) level  Pleural effusion     Rx / DC Orders   ED Discharge Orders     None        Note:  This document was prepared using Dragon voice recognition software and may include unintentional dictation errors.     Waymond Lorelle Cummins, MD 12/19/24 1247

## 2024-12-19 NOTE — ED Triage Notes (Signed)
 Pt BIB [facility] with AMS and unresponsiveness, LKW 2330 last night. Pt responsive to pain but not alert or oriented. Pt also has been drinking alcohol and attempted to sleep in her dog's bed last night. Pt was dx with H. Pylori on 12/11/24. Hx of COPD and HTN.    132/65 95% 2L 52-end tidal 77 HR 126-cbg 97.7-axillary

## 2024-12-19 NOTE — Telephone Encounter (Signed)
 Call to patient to advise she needs to make an appt for further refills left msg with sister.

## 2024-12-19 NOTE — ED Notes (Signed)
 Pt cleaned up and new brief placed. Purewick placed d/t receiving diuretic medication and currently altered. Family at bedside.

## 2024-12-20 ENCOUNTER — Encounter: Payer: Self-pay | Admitting: Internal Medicine

## 2024-12-20 DIAGNOSIS — J9811 Atelectasis: Secondary | ICD-10-CM | POA: Diagnosis present

## 2024-12-20 DIAGNOSIS — I130 Hypertensive heart and chronic kidney disease with heart failure and stage 1 through stage 4 chronic kidney disease, or unspecified chronic kidney disease: Secondary | ICD-10-CM | POA: Diagnosis present

## 2024-12-20 DIAGNOSIS — G9340 Encephalopathy, unspecified: Secondary | ICD-10-CM

## 2024-12-20 DIAGNOSIS — I5032 Chronic diastolic (congestive) heart failure: Secondary | ICD-10-CM | POA: Diagnosis present

## 2024-12-20 DIAGNOSIS — N1831 Chronic kidney disease, stage 3a: Secondary | ICD-10-CM | POA: Diagnosis present

## 2024-12-20 DIAGNOSIS — Z7902 Long term (current) use of antithrombotics/antiplatelets: Secondary | ICD-10-CM | POA: Diagnosis not present

## 2024-12-20 DIAGNOSIS — I6523 Occlusion and stenosis of bilateral carotid arteries: Secondary | ICD-10-CM | POA: Diagnosis present

## 2024-12-20 DIAGNOSIS — Z7901 Long term (current) use of anticoagulants: Secondary | ICD-10-CM | POA: Diagnosis not present

## 2024-12-20 DIAGNOSIS — E870 Hyperosmolality and hypernatremia: Secondary | ICD-10-CM | POA: Diagnosis present

## 2024-12-20 DIAGNOSIS — J439 Emphysema, unspecified: Secondary | ICD-10-CM | POA: Diagnosis present

## 2024-12-20 DIAGNOSIS — F0393 Unspecified dementia, unspecified severity, with mood disturbance: Secondary | ICD-10-CM | POA: Diagnosis present

## 2024-12-20 DIAGNOSIS — F03911 Unspecified dementia, unspecified severity, with agitation: Secondary | ICD-10-CM | POA: Diagnosis present

## 2024-12-20 DIAGNOSIS — F329 Major depressive disorder, single episode, unspecified: Secondary | ICD-10-CM | POA: Diagnosis present

## 2024-12-20 DIAGNOSIS — Z1152 Encounter for screening for COVID-19: Secondary | ICD-10-CM | POA: Diagnosis not present

## 2024-12-20 DIAGNOSIS — E785 Hyperlipidemia, unspecified: Secondary | ICD-10-CM | POA: Diagnosis present

## 2024-12-20 DIAGNOSIS — F10231 Alcohol dependence with withdrawal delirium: Secondary | ICD-10-CM | POA: Diagnosis present

## 2024-12-20 DIAGNOSIS — E1151 Type 2 diabetes mellitus with diabetic peripheral angiopathy without gangrene: Secondary | ICD-10-CM | POA: Diagnosis present

## 2024-12-20 DIAGNOSIS — F17210 Nicotine dependence, cigarettes, uncomplicated: Secondary | ICD-10-CM | POA: Diagnosis present

## 2024-12-20 DIAGNOSIS — E1122 Type 2 diabetes mellitus with diabetic chronic kidney disease: Secondary | ICD-10-CM | POA: Diagnosis present

## 2024-12-20 DIAGNOSIS — Z95828 Presence of other vascular implants and grafts: Secondary | ICD-10-CM | POA: Diagnosis not present

## 2024-12-20 DIAGNOSIS — E1140 Type 2 diabetes mellitus with diabetic neuropathy, unspecified: Secondary | ICD-10-CM | POA: Diagnosis present

## 2024-12-20 DIAGNOSIS — J441 Chronic obstructive pulmonary disease with (acute) exacerbation: Secondary | ICD-10-CM | POA: Diagnosis present

## 2024-12-20 DIAGNOSIS — D696 Thrombocytopenia, unspecified: Secondary | ICD-10-CM | POA: Diagnosis present

## 2024-12-20 DIAGNOSIS — I2510 Atherosclerotic heart disease of native coronary artery without angina pectoris: Secondary | ICD-10-CM | POA: Diagnosis present

## 2024-12-20 DIAGNOSIS — R569 Unspecified convulsions: Secondary | ICD-10-CM | POA: Diagnosis present

## 2024-12-20 LAB — CBC
HCT: 47.3 % — ABNORMAL HIGH (ref 36.0–46.0)
Hemoglobin: 15.3 g/dL — ABNORMAL HIGH (ref 12.0–15.0)
MCH: 30.4 pg (ref 26.0–34.0)
MCHC: 32.3 g/dL (ref 30.0–36.0)
MCV: 93.8 fL (ref 80.0–100.0)
Platelets: 173 K/uL (ref 150–400)
RBC: 5.04 MIL/uL (ref 3.87–5.11)
RDW: 16.1 % — ABNORMAL HIGH (ref 11.5–15.5)
WBC: 6 K/uL (ref 4.0–10.5)
nRBC: 0 % (ref 0.0–0.2)

## 2024-12-20 LAB — BASIC METABOLIC PANEL WITH GFR
Anion gap: 12 (ref 5–15)
BUN: 22 mg/dL (ref 8–23)
CO2: 26 mmol/L (ref 22–32)
Calcium: 8.4 mg/dL — ABNORMAL LOW (ref 8.9–10.3)
Chloride: 110 mmol/L (ref 98–111)
Creatinine, Ser: 0.98 mg/dL (ref 0.44–1.00)
GFR, Estimated: >60 mL/min
Glucose, Bld: 132 mg/dL — ABNORMAL HIGH (ref 70–99)
Potassium: 4.2 mmol/L (ref 3.5–5.1)
Sodium: 148 mmol/L — ABNORMAL HIGH (ref 135–145)

## 2024-12-20 LAB — URINE DRUG SCREEN
Amphetamines: NEGATIVE
Barbiturates: NEGATIVE
Benzodiazepines: NEGATIVE
Cocaine: NEGATIVE
Fentanyl: NEGATIVE
Methadone Scn, Ur: NEGATIVE
Opiates: NEGATIVE
Tetrahydrocannabinol: NEGATIVE

## 2024-12-20 LAB — CBG MONITORING, ED: Glucose-Capillary: 145 mg/dL — ABNORMAL HIGH (ref 70–99)

## 2024-12-20 LAB — TSH: TSH: 1.43 u[IU]/mL (ref 0.350–4.500)

## 2024-12-20 LAB — HEMOGLOBIN A1C
Hgb A1c MFr Bld: 6 % — ABNORMAL HIGH (ref 4.8–5.6)
Mean Plasma Glucose: 125.5 mg/dL

## 2024-12-20 MED ORDER — VENLAFAXINE HCL ER 75 MG PO CP24
75.0000 mg | ORAL_CAPSULE | Freq: Three times a day (TID) | ORAL | Status: DC
  Administered 2024-12-20 – 2024-12-22 (×5): 75 mg via ORAL
  Filled 2024-12-20 (×8): qty 1

## 2024-12-20 MED ORDER — GABAPENTIN 300 MG PO CAPS
300.0000 mg | ORAL_CAPSULE | Freq: Three times a day (TID) | ORAL | Status: DC
  Administered 2024-12-20 – 2024-12-22 (×5): 300 mg via ORAL
  Filled 2024-12-20 (×6): qty 1

## 2024-12-20 MED ORDER — LORAZEPAM 1 MG PO TABS: 1.0000 mg | ORAL_TABLET | ORAL | Status: DC | PRN

## 2024-12-20 MED ORDER — LORAZEPAM 2 MG/ML IJ SOLN: 1.0000 mg | INTRAMUSCULAR | Status: DC | PRN

## 2024-12-20 MED ORDER — LORAZEPAM 2 MG/ML IJ SOLN
4.0000 mg | Freq: Once | INTRAMUSCULAR | Status: AC
  Administered 2024-12-20: 4 mg via INTRAMUSCULAR
  Filled 2024-12-20: qty 2

## 2024-12-20 MED ORDER — UMECLIDINIUM BROMIDE 62.5 MCG/ACT IN AEPB
1.0000 | INHALATION_SPRAY | Freq: Every day | RESPIRATORY_TRACT | Status: DC
  Administered 2024-12-21 – 2024-12-22 (×2): 1 via RESPIRATORY_TRACT
  Filled 2024-12-20 (×2): qty 7

## 2024-12-20 MED ORDER — TIOTROPIUM BROMIDE 18 MCG IN CAPS: 1.0000 | ORAL_CAPSULE | Freq: Every day | RESPIRATORY_TRACT | Status: DC

## 2024-12-20 MED ORDER — CLOPIDOGREL BISULFATE 75 MG PO TABS
75.0000 mg | ORAL_TABLET | Freq: Every day | ORAL | Status: DC
  Administered 2024-12-21 – 2024-12-22 (×2): 75 mg via ORAL
  Filled 2024-12-20 (×3): qty 1

## 2024-12-20 MED ORDER — METOPROLOL SUCCINATE ER 50 MG PO TB24
50.0000 mg | ORAL_TABLET | Freq: Every day | ORAL | Status: DC
  Administered 2024-12-21 – 2024-12-22 (×2): 50 mg via ORAL
  Filled 2024-12-20 (×3): qty 1

## 2024-12-20 MED ORDER — PHENOBARBITAL 32.4 MG PO TABS: 32.4000 mg | ORAL_TABLET | Freq: Three times a day (TID) | ORAL | Status: DC

## 2024-12-20 MED ORDER — FOLIC ACID 1 MG PO TABS
1.0000 mg | ORAL_TABLET | Freq: Every day | ORAL | Status: DC
  Administered 2024-12-21 – 2024-12-22 (×2): 1 mg via ORAL
  Filled 2024-12-20 (×3): qty 1

## 2024-12-20 MED ORDER — PHENOBARBITAL 32.4 MG PO TABS
64.8000 mg | ORAL_TABLET | Freq: Three times a day (TID) | ORAL | Status: DC
  Administered 2024-12-20 – 2024-12-22 (×5): 64.8 mg via ORAL
  Filled 2024-12-20 (×5): qty 2

## 2024-12-20 MED ORDER — AMLODIPINE BESYLATE 10 MG PO TABS
10.0000 mg | ORAL_TABLET | Freq: Every day | ORAL | Status: DC
  Administered 2024-12-21 – 2024-12-22 (×2): 10 mg via ORAL
  Filled 2024-12-20: qty 2
  Filled 2024-12-20 (×2): qty 1

## 2024-12-20 MED ORDER — ADULT MULTIVITAMIN W/MINERALS CH
1.0000 | ORAL_TABLET | Freq: Every day | ORAL | Status: DC
  Administered 2024-12-21 – 2024-12-22 (×2): 1 via ORAL
  Filled 2024-12-20 (×3): qty 1

## 2024-12-20 MED ORDER — THIAMINE HCL 100 MG/ML IJ SOLN
500.0000 mg | Freq: Three times a day (TID) | INTRAVENOUS | Status: DC
  Administered 2024-12-20 – 2024-12-22 (×6): 500 mg via INTRAVENOUS
  Filled 2024-12-20 (×11): qty 5

## 2024-12-20 NOTE — ED Notes (Signed)
 This tech called [facility] to monitor patient

## 2024-12-20 NOTE — Discharge Instructions (Signed)

## 2024-12-20 NOTE — Progress Notes (Signed)
 " PROGRESS NOTE    Barbara Contreras  FMW:[id number] DOB: Aug 02, 1951 DOA: 12/19/2024 PCP: Eliverto Bette Hover, MD     Brief Narrative:   Barbara Contreras is a 74 y.o. year old female with medical history of hypertension, hyperlipidemia, type 2 diabetes, COPD, HFpEF (EF 55%, G2 DD) in 04/2024 presented to the ED after being found confused by family.  On exam patient noted to be confused.  Patient able to contribute to history.  Family is present at bedside stating confusion developed overnight and she was found in the AM confused. When they told her to go to bed, she went into the dog's bed to sleep. She was found with her pulse ox in the 70s.  On arrival to the ED patient was noted to be HDS stable.  Lab work and imaging obtained.  CBC without leukocytosis, mild thrombocytopenia which is new.  CMP unremarkable, ethanol level negative, VBG 7.3/64/39.  Troponin normal, BNP mildly elevated at 749.  UA without signs of infection.  CT head normal.  CTA PE study was performed that showed no PE but did show cardiomegaly along with soft tissue stranding and dermal thickening in the medial right breast concerning for scarring.  Given need for continued care, [facility] contacted for admission.    Assessment & Plan:   Principal Problem:   Acute encephalopathy Active Problems:   HTN (hypertension)   Essential hypertension   HLD (hyperlipidemia)   Tobacco use disorder   Carcinoma of upper-outer quadrant of right breast in female, estrogen receptor positive (HCC)   Alcohol use disorder   GERD without esophagitis   PAD (peripheral artery disease)   COPD (chronic obstructive pulmonary disease) (HCC)   CKD stage 3a, GFR 45-59 ml/min (HCC)  # Encephalopathy Likely 2/2 alcohol withdrawal, underlying dementia. Neuro exam non-focal, ct head negative. No infectious symptoms, ua negative, covid/flu/rsv neg. Mild hypercarbia on vbg but no respiratory symptoms. No significant electrolyte abnormality, lfts wnl -  blood cultures - tsh - b12 - start high dose thiamine  - uds - mri if fails to improve  # Alcohol use disorder with withdrawal Mild withdrawal symptoms currently. Last drink 1/14. Drinks wine/beer every night, sister unaware how much - thiamine  as above - add ciwa ativan   # Dementia concern - f/u b12, tsh  # Hypernatremia 2/2 npo order. She is awake and alert, doesn't need to be npo - let her eat and drink  # History breast cancer S/p lumpectomy, radiation, on anastrozole  outpt  # COPD No cough or wheeze to suggest exacerbation.  - d/c doxy, steroids - monitor - home spiriva   # HTN Bp elevated - resume home amlodipine , metop  # PAD # CAS S/p vascular interventions - home plavix  - resume home statin, zetia  at d/c  # MDD - home effexor   # t2dm Diet controlled, A1c 6 - stop ssi - monitor fastings  # Neuropathy - cont home gabapentin  but reduce dose to 300 tid from home 600   DVT prophylaxis: lovenox  Code Status: full Family Communication: sister at bedside 1/17  Level of care: Med-Surg    Consultants:  none  Procedures: none  Antimicrobials:  none    Subjective: No complaints  Objective: Vitals:   12/20/24 0700 12/20/24 0800 12/20/24 0900 12/20/24 0934  BP: (!) 173/101 (!) 157/92 (!) 162/78   Pulse: (!) 106 96 90 83  Resp: 16 17 (!) 23 14  Temp:  98.2 F (36.8 C)    TempSrc:  Oral  SpO2:   94%   Weight:      Height:        Intake/Output Summary (Last 24 hours) at 12/20/2024 0953 Last data filed at 12/19/2024 1429 Gross per 24 hour  Intake --  Output 1100 ml  Net -1100 ml   Filed Weights   12/19/24 0947  Weight: 70.5 kg    Examination:  General exam: figity Respiratory system: Clear to auscultation. Respiratory effort normal. Cardiovascular system: S1 & S2 heard, RRR.   Gastrointestinal system: Abdomen is nondistended, soft and nontender.   Central nervous system: Alert and oriented to self and place but not town, not  year. Cn 2-12 grossly intact, symmetric upper and lower strength. Mild tremor Extremities: Symmetric 5 x 5 power. Skin: No rashes, lesions or ulcers Psychiatry: confused    Data Reviewed: I have personally reviewed following labs and imaging studies  CBC: Recent Labs  Lab 12/19/24 1035 12/20/24 0551  WBC 4.6 6.0  NEUTROABS 3.4  --   HGB 15.3* 15.3*  HCT 47.2* 47.3*  MCV 92.9 93.8  PLT 147* 173   Basic Metabolic Panel: Recent Labs  Lab 12/19/24 1035 12/19/24 1550 12/20/24 0551  NA 144  --  148*  K 4.6  --  4.2  CL 106  --  110  CO2 30  --  26  GLUCOSE 128*  --  132*  BUN 19  --  22  CREATININE 0.91  --  0.98  CALCIUM  9.6  --  8.4*  MG  --  2.2  --    GFR: Estimated Creatinine Clearance: 50.2 mL/min (by C-G formula based on SCr of 0.98 mg/dL). Liver Function Tests: Recent Labs  Lab 12/19/24 1035  AST 25  ALT 34  ALKPHOS 68  BILITOT 0.3  PROT 7.3  ALBUMIN 4.5   No results for input(s): LIPASE, AMYLASE in the last 168 hours. Recent Labs  Lab 12/19/24 1135  AMMONIA 45*   Coagulation Profile: No results for input(s): INR, PROTIME in the last 168 hours. Cardiac Enzymes: No results for input(s): CKTOTAL, CKMB, CKMBINDEX, TROPONINI in the last 168 hours. BNP (last 3 results) Recent Labs    12/19/24 1035  PROBNP 749.0*   HbA1C: Recent Labs    12/19/24 2238  HGBA1C 6.0*   CBG: Recent Labs  Lab 12/19/24 2248 12/20/24 0727  GLUCAP 159* 145*   Lipid Profile: No results for input(s): CHOL, HDL, LDLCALC, TRIG, CHOLHDL, LDLDIRECT in the last 72 hours. Thyroid Function Tests: No results for input(s): TSH, T4TOTAL, FREET4, T3FREE, THYROIDAB in the last 72 hours. Anemia Panel: No results for input(s): VITAMINB12, FOLATE, FERRITIN, TIBC, IRON , RETICCTPCT in the last 72 hours. Urine analysis:    Component Value Date/Time   COLORURINE COLORLESS (A) 12/19/2024 1115   APPEARANCEUR CLEAR (A) 12/19/2024  1115   LABSPEC 1.003 (L) 12/19/2024 1115   PHURINE 6.0 12/19/2024 1115   GLUCOSEU NEGATIVE 12/19/2024 1115   HGBUR NEGATIVE 12/19/2024 1115   BILIRUBINUR NEGATIVE 12/19/2024 1115   KETONESUR NEGATIVE 12/19/2024 1115   PROTEINUR NEGATIVE 12/19/2024 1115   NITRITE NEGATIVE 12/19/2024 1115   LEUKOCYTESUR NEGATIVE 12/19/2024 1115   Sepsis Labs: @LABRCNTIP (procalcitonin:4,lacticidven:4)  ) Recent Results (from the past 240 hours)  Resp panel by RT-PCR (RSV, Flu A&B, Covid) Anterior Nasal Swab     Status: None   Collection Time: 12/19/24  3:18 PM   Specimen: Anterior Nasal Swab  Result Value Ref Range Status   SARS Coronavirus 2 by RT PCR NEGATIVE NEGATIVE Final  Comment: (NOTE) SARS-CoV-2 target nucleic acids are NOT DETECTED.  The SARS-CoV-2 RNA is generally detectable in upper respiratory specimens during the acute phase of infection. The lowest concentration of SARS-CoV-2 viral copies this assay can detect is 138 copies/mL. A negative result does not preclude SARS-Cov-2 infection and should not be used as the sole basis for treatment or other patient management decisions. A negative result may occur with  improper specimen collection/handling, submission of specimen other than nasopharyngeal swab, presence of viral mutation(s) within the areas targeted by this assay, and inadequate number of viral copies(<138 copies/mL). A negative result must be combined with clinical observations, patient history, and epidemiological information. The expected result is Negative.  Fact Sheet for Patients:  bloggercourse.com  Fact Sheet for Healthcare Providers:  seriousbroker.it  This test is no t yet approved or cleared by the [place]  FDA and  has been authorized for detection and/or diagnosis of SARS-CoV-2 by FDA under an Emergency Use Authorization (EUA). This EUA will remain  in effect (meaning this test can be used) for the  duration of the COVID-19 declaration under Section 564(b)(1) of the Act, 21 U.S.C.section 360bbb-3(b)(1), unless the authorization is terminated  or revoked sooner.       Influenza A by PCR NEGATIVE NEGATIVE Final   Influenza B by PCR NEGATIVE NEGATIVE Final    Comment: (NOTE) The Xpert Xpress SARS-CoV-2/FLU/RSV plus assay is intended as an aid in the diagnosis of influenza from Nasopharyngeal swab specimens and should not be used as a sole basis for treatment. Nasal washings and aspirates are unacceptable for Xpert Xpress SARS-CoV-2/FLU/RSV testing.  Fact Sheet for Patients: bloggercourse.com  Fact Sheet for Healthcare Providers: seriousbroker.it  This test is not yet approved or cleared by the [place]  FDA and has been authorized for detection and/or diagnosis of SARS-CoV-2 by FDA under an Emergency Use Authorization (EUA). This EUA will remain in effect (meaning this test can be used) for the duration of the COVID-19 declaration under Section 564(b)(1) of the Act, 21 U.S.C. section 360bbb-3(b)(1), unless the authorization is terminated or revoked.     Resp Syncytial Virus by PCR NEGATIVE NEGATIVE Final    Comment: (NOTE) Fact Sheet for Patients: bloggercourse.com  Fact Sheet for Healthcare Providers: seriousbroker.it  This test is not yet approved or cleared by the [place]  FDA and has been authorized for detection and/or diagnosis of SARS-CoV-2 by FDA under an Emergency Use Authorization (EUA). This EUA will remain in effect (meaning this test can be used) for the duration of the COVID-19 declaration under Section 564(b)(1) of the Act, 21 U.S.C. section 360bbb-3(b)(1), unless the authorization is terminated or revoked.  Performed at [facility], [address] [place], [place] [address]          Radiology Studies: CT Angio Chest PE W/Cm  &/Or Wo Cm Result Date: 12/19/2024 EXAM: CTA CHEST 12/19/2024 11:51:59 AM TECHNIQUE: CTA of the chest was performed after the administration of 75 mL of iohexol  (OMNIPAQUE ) 350 MG/ML injection. Multiplanar reformatted images are provided for review. MIP images are provided for review. Automated exposure control, iterative reconstruction, and/or weight based adjustment of the mA/kV was utilized to reduce the radiation dose to as low as reasonably achievable. COMPARISON: CT of the chest dated 06/20/2024. CLINICAL HISTORY: Pulmonary embolism (PE) suspected, high prob. FINDINGS: PULMONARY ARTERIES: Pulmonary arteries are adequately opacified for evaluation. No acute pulmonary embolus. Main pulmonary artery is normal in caliber. MEDIASTINUM: The heart is mildly enlarged. There is a mildly prominent left  atrial appendage. The pericardium demonstrates no acute abnormality. The thoracic aorta demonstrates moderate calcific atheromatous disease. LYMPH NODES: No mediastinal, hilar or axillary lymphadenopathy. LUNGS AND PLEURA: There are ground glass opacities within the lungs bilaterally. There is mild dependent atelectasis. There is a tiny right-sided pleural effusion. No pneumothorax. UPPER ABDOMEN: The patient is status post cholecystectomy. SOFT TISSUES AND BONES: There is soft tissue stranding/scarring present superiorly within the right breast. There is mild thickening of the dermis of the right breast present medially. No acute bone abnormality. IMPRESSION: 1. No pulmonary embolism. 2. Bilateral ground glass opacities and mild dependent atelectasis. 3. Tiny right-sided pleural effusion. 4. Mild cardiomegaly with mildly prominent left atrial appendage. 5. Moderate calcific atheromatous disease of the thoracic aorta. 6. Soft tissue stranding/scarring and mild dermal thickening in the medial right breast. Electronically signed by: Evalene Coho MD 12/19/2024 12:12 PM EST RP Workstation: [id number]   DG Chest 1  View Result Date: 12/19/2024 CLINICAL DATA:  Altered mental status EXAM: CHEST  1 VIEW COMPARISON:  Apr 27, 2024 FINDINGS: Stable cardiomediastinal silhouette. Minimal bibasilar subsegmental atelectasis or edema is noted. Bony thorax is unremarkable. IMPRESSION: Minimal bibasilar subsegmental atelectasis or edema. Electronically Signed   By: Lynwood Landy Raddle M.D.   On: 12/19/2024 10:21   CT Head Wo Contrast Result Date: 12/19/2024 EXAM: CT HEAD WITHOUT CONTRAST 12/19/2024 10:03:21 AM TECHNIQUE: CT of the head was performed without the administration of intravenous contrast. Automated exposure control, iterative reconstruction, and/or weight based adjustment of the mA/kV was utilized to reduce the radiation dose to as low as reasonably achievable. COMPARISON: CT of the head dated 10/01/2022. CLINICAL HISTORY: Mental status change, unknown cause; ams. FINDINGS: BRAIN AND VENTRICLES: No acute hemorrhage. No evidence of acute infarct. No hydrocephalus. No extra-axial collection. No mass effect or midline shift. Calcified atherosclerotic plaque in cavernous/supraclinoid ICA and intradural vertebral arteries. ORBITS: No acute abnormality. SINUSES: No acute abnormality. SOFT TISSUES AND SKULL: No acute soft tissue abnormality. No skull fracture. IMPRESSION: 1. No acute intracranial abnormality. 2. Calcified atherosclerotic plaque in the cavernous/supraclinoid ICAs and intradural vertebral arteries. Electronically signed by: Timothy Berrigan MD 12/19/2024 10:12 AM EST RP Workstation: [id number]        Scheduled Meds:  amLODipine   10 mg Oral Daily   clopidogrel   75 mg Oral Daily   enoxaparin  (LOVENOX ) injection  40 mg Subcutaneous Q24H   folic acid   1 mg Oral Daily   gabapentin   300 mg Oral TID   metoprolol  succinate  50 mg Oral Daily   multivitamin with minerals  1 tablet Oral Daily   sodium chloride  flush  3 mL Intravenous Q12H   Tiotropium Bromide   1 capsule Irrigation Daily   venlafaxine  XR  75 mg  Oral TID   Continuous Infusions:  thiamine  (VITAMIN B1) injection       LOS: 0 days     Devaughn KATHEE Ban, MD [facility]   If 7PM-7AM, please contact night-coverage [email or web address] Password [email or web address] 12/20/2024, 9:53 AM     "

## 2024-12-20 NOTE — Progress Notes (Signed)
 PT Cancellation Note  Patient Details Name: Barbara Contreras MRN: [id number] DOB: [birth year]   Cancelled Treatment:    Reason Eval/Treat Not Completed: Other (comment) RN notified Pt behavior limiting ability to participate in physical therapy. PT to check back at later time.    Barbara Contreras 12/20/2024, 1:53 PM

## 2024-12-20 NOTE — Progress Notes (Signed)
 Patient refusing am medication, IV insertion and labs. Notified MD and attempted to reach patient's sister/niece for support however receive voicemail on both numbers.

## 2024-12-20 NOTE — TOC Initial Note (Signed)
 Transition of Care [facility]) - Initial/Assessment Note    Patient Details  Name: Barbara Contreras MRN: [id number] Date of Birth: September 11, 1951  [facility]) CM/SW Contact:    Dinesh Ulysse L Karthika Glasper, LCSW Phone Number: 12/20/2024, 3:13 PM  Clinical Narrative:                      CSW attempted to meet with patient no family at bedside. Patient was not coherent enough to discuss SNF placement. CSW attempted to reach the sister and the niece to no avail.     Patient Goals and CMS Choice            Expected Discharge Plan and Services                                              Prior Living Arrangements/Services                       Activities of Daily Living      Permission Sought/Granted                  Emotional Assessment              Admission diagnosis:  Acute encephalopathy [G93.40] Patient Active Problem List   Diagnosis Date Noted   CKD stage 3a, GFR 45-59 ml/min (HCC) 12/20/2024   Acute encephalopathy 12/19/2024   COPD (chronic obstructive pulmonary disease) (HCC) 04/27/2024   Coronary artery disease involving native coronary artery of native heart without angina pectoris 04/08/2024   Osteoporosis, 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   PAD (peripheral artery disease) 05/29/2023   Carotid stenosis, left 01/25/2023   Acute gastroenteritis 11/06/2022   Dyslipidemia 11/06/2022   Essential hypertension 11/06/2022   GERD without esophagitis 11/06/2022   Asthma, chronic 11/06/2022   Alcohol use disorder 10/02/2022   AKI (acute kidney injury) 10/02/2022   Closed fracture of rib of right side 10/02/2022   Closed L1 vertebral fracture (HCC) 10/02/2022   Closed L2 vertebral fracture (HCC) 10/02/2022   Fall 10/02/2022   Electrolyte abnormality 10/01/2022   Osteopenia 08/30/2022   Carcinoma of upper-outer quadrant of right breast in female, estrogen receptor positive (HCC) 08/03/2021   Carotid stenosis 08/13/2018   Tobacco use  disorder 08/13/2018   Elevated troponin 06/16/2017   Syncope and collapse 03/09/2016   HTN (hypertension) 03/09/2016   HLD (hyperlipidemia) 03/09/2016   Depression 03/09/2016   Heart murmur 03/09/2016   PCP:  Eliverto Bette Hover, MD Pharmacy:   [facility] [address], [place] - [address] [place] [address] Phone: [phone] Fax: [phone]     Social Drivers of Health (SDOH) Social History: SDOH Screenings   Food Insecurity: No Food Insecurity (11/17/2024)   Received from [facility]  Housing: Low Risk  (11/17/2024)   Received from [facility]  Transportation Needs: Unmet Transportation Needs (11/17/2024)   Received from [facility]  Utilities: Not At Risk (11/17/2024)   Received from [facility]  Depression [phone]): Low Risk (09/08/2024)  Financial Resource Strain: Medium Risk (11/17/2024)   Received from [facility]  Social Connections: Unknown (04/27/2024)  Tobacco Use: High Risk (11/24/2024)   Received from [facility]   SDOH Interventions:     Readmission Risk Interventions  No data to display

## 2024-12-20 NOTE — TOC Initial Note (Signed)
 Transition of Care [facility]) - Initial/Assessment Note    Patient Details  Name: Barbara Contreras MRN: [id number] Date of Birth: May 30, 1951  [facility]) CM/SW Contact:    Gabbriella Presswood L Nabil Bubolz, LCSW Phone Number: 12/20/2024, 1:05 PM  Clinical Narrative:                    [facility] consult received for substance abuse education/counseling. TOC does not provide education/counseling. Resources added to the AVS.      Patient Goals and CMS Choice            Expected Discharge Plan and Services                                              Prior Living Arrangements/Services                       Activities of Daily Living      Permission Sought/Granted                  Emotional Assessment              Admission diagnosis:  Acute encephalopathy [G93.40] Patient Active Problem List   Diagnosis Date Noted   CKD stage 3a, GFR 45-59 ml/min (HCC) 12/20/2024   Acute encephalopathy 12/19/2024   COPD (chronic obstructive pulmonary disease) (HCC) 04/27/2024   Coronary artery disease involving native coronary artery of native heart without angina pectoris 04/08/2024   Osteoporosis, 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   PAD (peripheral artery disease) 05/29/2023   Carotid stenosis, left 01/25/2023   Acute gastroenteritis 11/06/2022   Dyslipidemia 11/06/2022   Essential hypertension 11/06/2022   GERD without esophagitis 11/06/2022   Asthma, chronic 11/06/2022   Alcohol use disorder 10/02/2022   AKI (acute kidney injury) 10/02/2022   Closed fracture of rib of right side 10/02/2022   Closed L1 vertebral fracture (HCC) 10/02/2022   Closed L2 vertebral fracture (HCC) 10/02/2022   Fall 10/02/2022   Electrolyte abnormality 10/01/2022   Osteopenia 08/30/2022   Carcinoma of upper-outer quadrant of right breast in female, estrogen receptor positive (HCC) 08/03/2021   Carotid stenosis 08/13/2018   Tobacco use disorder 08/13/2018   Elevated troponin 06/16/2017    Syncope and collapse 03/09/2016   HTN (hypertension) 03/09/2016   HLD (hyperlipidemia) 03/09/2016   Depression 03/09/2016   Heart murmur 03/09/2016   PCP:  Eliverto Bette Hover, MD Pharmacy:   [facility] [address] [place] - [address] [place] [address] Phone: [phone] Fax: [phone]     Social Drivers of Health (SDOH) Social History: SDOH Screenings   Food Insecurity: No Food Insecurity (11/17/2024)   Received from [facility]  Housing: Low Risk  (11/17/2024)   Received from [facility]  Transportation Needs: Unmet Transportation Needs (11/17/2024)   Received from [facility]  Utilities: Not At Risk (11/17/2024)   Received from [facility]  Depression [phone]): Low Risk (09/08/2024)  Financial Resource Strain: Medium Risk (11/17/2024)   Received from [facility]  Social Connections: Unknown (04/27/2024)  Tobacco Use: High Risk (11/24/2024)   Received from [facility]   SDOH Interventions:     Readmission Risk Interventions     No data to display

## 2024-12-21 DIAGNOSIS — G9340 Encephalopathy, unspecified: Secondary | ICD-10-CM | POA: Diagnosis not present

## 2024-12-21 LAB — BASIC METABOLIC PANEL WITH GFR
Anion gap: 10 (ref 5–15)
BUN: 27 mg/dL — ABNORMAL HIGH (ref 8–23)
CO2: 27 mmol/L (ref 22–32)
Calcium: 8.9 mg/dL (ref 8.9–10.3)
Chloride: 109 mmol/L (ref 98–111)
Creatinine, Ser: 0.82 mg/dL (ref 0.44–1.00)
GFR, Estimated: >60 mL/min
Glucose, Bld: 96 mg/dL (ref 70–99)
Potassium: 3.4 mmol/L — ABNORMAL LOW (ref 3.5–5.1)
Sodium: 146 mmol/L — ABNORMAL HIGH (ref 135–145)

## 2024-12-21 LAB — VITAMIN B12: Vitamin B-12: 2211 pg/mL — ABNORMAL HIGH (ref 180–914)

## 2024-12-21 MED ORDER — DICYCLOMINE HCL 10 MG PO CAPS
10.0000 mg | ORAL_CAPSULE | Freq: Two times a day (BID) | ORAL | Status: DC
  Administered 2024-12-21 – 2024-12-22 (×2): 10 mg via ORAL
  Filled 2024-12-21 (×2): qty 1

## 2024-12-21 MED ORDER — MELATONIN 5 MG PO TABS
5.0000 mg | ORAL_TABLET | Freq: Every evening | ORAL | Status: DC | PRN
  Administered 2024-12-21: 5 mg via ORAL
  Filled 2024-12-21: qty 1

## 2024-12-21 MED ORDER — MONTELUKAST SODIUM 10 MG PO TABS
10.0000 mg | ORAL_TABLET | Freq: Every day | ORAL | Status: DC
  Administered 2024-12-21: 10 mg via ORAL
  Filled 2024-12-21: qty 1

## 2024-12-21 MED ORDER — ATORVASTATIN CALCIUM 20 MG PO TABS
80.0000 mg | ORAL_TABLET | Freq: Every day | ORAL | Status: DC
  Administered 2024-12-22: 80 mg via ORAL
  Filled 2024-12-21: qty 4

## 2024-12-21 MED ORDER — FLUTICASONE FUROATE-VILANTEROL 200-25 MCG/ACT IN AEPB
1.0000 | INHALATION_SPRAY | Freq: Every day | RESPIRATORY_TRACT | Status: DC
  Administered 2024-12-21 – 2024-12-22 (×2): 1 via RESPIRATORY_TRACT
  Filled 2024-12-21: qty 28

## 2024-12-21 NOTE — Evaluation (Addendum)
 Physical Therapy Evaluation Patient Details Name: Barbara Contreras MRN: [id number] DOB: Mar 10, 1951 Today's Date: 12/21/2024  History of Present Illness  Barbara Contreras is a 74 y.o. year old female with medical history of hypertension, hyperlipidemia, type 2 diabetes, COPD, HFpEF (EF 55%, G2 DD) in 04/2024 presented to the ED after being found confused by family.  On exam patient noted to be confused.  Patient able to contribute to history.  Family is present at bedside stating confusion developed overnight and she was found in the AM confused. When they told her to go to bed, she went into the dog's bed to sleep. She was found with her pulse ox in the 70s.  On arrival to the ED patient was noted to be HDS stable.  Lab work and imaging obtained.  CBC without leukocytosis, mild thrombocytopenia which is new.  CMP unremarkable, ethanol level negative, VBG 7.3/64/39.  Troponin normal, BNP mildly elevated at 749.  UA without signs of infection.  CT head normal.  CTA PE study was performed that showed no PE but did show cardiomegaly along with soft tissue stranding and dermal thickening in the medial right breast concerning for scarring.  Given need for continued care, [facility] contacted for admission.  Clinical Impression  Upon arrival, the patient was found in a deep squat having a bowel movement in a bucket. Fecal streaks were noted on the bed sheets, covers, and the floor surrounding the patient. PT entered the room accompanied by the patient's sister. Pt stated, I thought the call bell was just the TV remote. PT is indicated for an evaluation due to a decline in functional status. Per the sister, baseline mobility and ADLs were previously ModI; however, this report should be viewed with caution as the sister appeared unconcerned by the current situation.  Pt required minA to stand from the squatting position and with mild unsteadiness. While the patient is alert and oriented (A&O) x 4, cognitive status was  difficult to assess given the circumstances. Pt. resides in a house with their sister and has family/friend support; the residence includes five steps to enter. Gait was assessed with one-handed assistance from the therapist and additional assistance from the nursing staff for safety. Gait was very unsteady during the few steps taken to the [facility]. The clinical impression is that the patient presents with mild to moderate mobility limitations. Skilled physical therapy is recommended to address safety, mobility, and discharge planning       If plan is discharge home, recommend the following: A lot of help with walking and/or transfers;Help with stairs or ramp for entrance;Assist for transportation;Supervision due to cognitive status;A lot of help with bathing/dressing/bathroom;Assistance with cooking/housework;Direct supervision/assist for financial management;Direct supervision/assist for medications management   Can travel by private vehicle   No    Equipment Recommendations Other (comment) (TBD)  Recommendations for Other Services       Functional Status Assessment Patient has had a recent decline in their functional status and demonstrates the ability to make significant improvements in function in a reasonable and predictable amount of time.     Precautions / Restrictions Restrictions Weight Bearing Restrictions Per Provider Order: No      Mobility  Bed Mobility                    Transfers Overall transfer level: Needs assistance Equipment used: 2 person hand held assist Transfers: Sit to/from Stand, Bed to chair/wheelchair/BSC Sit to Stand: Min assist  General transfer comment: minA to stand from deep squat during bowl movement at bedside    Ambulation/Gait Ambulation/Gait assistance: Min assist Gait Distance (Feet): 5 Feet Assistive device: 1 person hand held assist         General Gait Details: short ambulation to [facility] in room, mildly unstable;  limited safety awareness throughout  Stairs            Wheelchair Mobility     Tilt Bed    Modified Rankin (Stroke Patients Only)       Balance Overall balance assessment: Needs assistance Sitting-balance support: Feet unsupported, Bilateral upper extremity supported Sitting balance-Leahy Scale: Fair Sitting balance - Comments: decent control at end range squatting position   Standing balance support: During functional activity, Single extremity supported Standing balance-Leahy Scale: Fair                               Pertinent Vitals/Pain Pain Assessment Pain Assessment: PAINAD Breathing: normal Negative Vocalization: none Facial Expression: smiling or inexpressive Body Language: relaxed Consolability: no need to console PAINAD Score: 0 Pain Intervention(s): Monitored during session    Home Living Family/patient expects to be discharged to:: Private residence Living Arrangements: Other relatives Available Help at Discharge: Family;Available 24 hours/day Type of Home: House Home Access: Stairs to enter Entrance Stairs-Rails: Right;Left;Can reach both Entrance Stairs-Number of Steps: 5 onto the deck   Home Layout: One level        Prior Function Prior Level of Function : Independent/Modified Independent;Driving             Mobility Comments: no AD at baseline       Extremity/Trunk Assessment   Upper Extremity Assessment Upper Extremity Assessment: Generalized weakness;Overall [facility] for tasks assessed    Lower Extremity Assessment Lower Extremity Assessment: Generalized weakness;Overall [facility] for tasks assessed    Cervical / Trunk Assessment Cervical / Trunk Assessment: Normal  Communication   Communication Communication: No apparent difficulties    Cognition Arousal: Alert Behavior During Therapy: Impulsive, Anxious, Agitated, WFL for tasks assessed/performed   PT - Cognitive impairments: Safety/Judgement, Problem solving,  Memory, Difficult to assess                         Following commands: Impaired Following commands impaired: Follows one step commands inconsistently     Cueing Cueing Techniques: Verbal cues, Gestural cues, Tactile cues     General Comments      Exercises     Assessment/Plan    PT Assessment Patient needs continued PT services  PT Problem List Decreased strength;Decreased activity tolerance;Decreased balance;Decreased mobility       PT Treatment Interventions Gait training;Stair training;Functional mobility training;Therapeutic activities;Neuromuscular re-education;Balance training;Therapeutic exercise;Patient/family education    PT Goals (Current goals can be found in the Care Plan section)  Acute Rehab PT Goals PT Goal Formulation: Patient unable to participate in goal setting    Frequency Min 2X/week     Co-evaluation               AM-PAC PT 6 Clicks Mobility  Outcome Measure Help needed turning from your back to your side while in a flat bed without using bedrails?: A Little Help needed moving from lying on your back to sitting on the side of a flat bed without using bedrails?: A Little Help needed moving to and from a bed to a chair (including a wheelchair)?: A Little Help needed  standing up from a chair using your arms (e.g., wheelchair or bedside chair)?: A Little Help needed to walk in hospital room?: A Lot Help needed climbing 3-5 steps with a railing? : A Lot 6 Click Score: 16    End of Session   Activity Tolerance: Treatment limited secondary to agitation Patient left: with nursing/sitter in room;with family/visitor present;Other (comment) (pt. sitting on BSC sponge bathing with assistance from RN) Nurse Communication: Mobility status PT Visit Diagnosis: Other abnormalities of gait and mobility (R26.89);Muscle weakness (generalized) (M62.81);Difficulty in walking, not elsewhere classified (R26.2)    Time: 8956-8942 PT Time  Calculation (min) (ACUTE ONLY): 14 min   Charges:   PT Evaluation $PT Eval Low Complexity: 1 Low   PT General Charges $$ ACUTE PT VISIT: 1 Visit         Sherlean Lesches DPT, PT    Loney Peto A Deanthony Maull 12/21/2024, 11:09 AM

## 2024-12-21 NOTE — Progress Notes (Addendum)
 " PROGRESS NOTE    Barbara Contreras  FMW:[id number] DOB: 1951/08/18 DOA: 12/19/2024 PCP: Eliverto Bette Hover, MD     Brief Narrative:   Barbara Contreras is a 74 y.o. year old female with medical history of hypertension, hyperlipidemia, type 2 diabetes, COPD, HFpEF (EF 55%, G2 DD) in 04/2024 presented to the ED after being found confused by family.  On exam patient noted to be confused.  Patient able to contribute to history.  Family is present at bedside stating confusion developed overnight and she was found in the AM confused. When they told her to go to bed, she went into the dog's bed to sleep. She was found with her pulse ox in the 70s.  On arrival to the ED patient was noted to be HDS stable.  Lab work and imaging obtained.  CBC without leukocytosis, mild thrombocytopenia which is new.  CMP unremarkable, ethanol level negative, VBG 7.3/64/39.  Troponin normal, BNP mildly elevated at 749.  UA without signs of infection.  CT head normal.  CTA PE study was performed that showed no PE but did show cardiomegaly along with soft tissue stranding and dermal thickening in the medial right breast concerning for scarring.  Given need for continued care, [facility] contacted for admission.    Assessment & Plan:   Principal Problem:   Acute encephalopathy Active Problems:   HTN (hypertension)   Essential hypertension   HLD (hyperlipidemia)   Tobacco use disorder   Carcinoma of upper-outer quadrant of right breast in female, estrogen receptor positive (HCC)   Alcohol use disorder   GERD without esophagitis   PAD (peripheral artery disease)   COPD (chronic obstructive pulmonary disease) (HCC)   CKD stage 3a, GFR 45-59 ml/min (HCC)  # Encephalopathy # Dementia Likely 2/2 alcohol withdrawal, underlying dementia. Neuro exam non-focal, ct head negative. No infectious symptoms, ua negative, covid/flu/rsv neg. Mild hypercarbia on vbg but no respiratory symptoms. No significant electrolyte abnormality,  lfts wnl. Tsh wnl, b12 wnl. UDS neg. Remains confused but improving today - monitor cultures - continue high dose thiamine  - consider mri prior to d/c  # Alcohol use disorder with withdrawal Mild withdrawal symptoms currently. Yesterday did get agitated. Last drink 1/14. Drinks wine/beer every night, sister unaware how much - thiamine  as above - phenobarb taper started 1/17 - ciwa ativan   # Hypernatremia Improving now that she is not npo - monitor  # History breast cancer S/p lumpectomy, radiation. On anastrozole  outpt  # COPD No cough or wheeze to suggest exacerbation.  - d/c doxy, steroids - monitor - home spiriva , breo, singulair   # HTN Bp elevated - home amlodipine , metop  # PAD # CAS S/p vascular interventions - home plavix  - home statin, zetia     # MDD - home effexor   # t2dm Diet controlled, A1c 6 - monitor fastings  # Neuropathy - cont home gabapentin  but reduce dose to 300 tid from home 600  # Debility PT advising SNF - toc consulted   DVT prophylaxis: lovenox  Code Status: full Family Communication: sisters at bedside 1/18  Level of care: Med-Surg    Consultants:  none  Procedures: none  Antimicrobials:  none    Subjective: No complaints. Calm. confused  Objective: Vitals:   12/20/24 2328 12/21/24 0400 12/21/24 0556 12/21/24 0819  BP: (!) 170/75 (!) 148/68  (!) 179/82  Pulse: 91 79  80  Resp: 20 16  18   Temp: 99 F (37.2 C) 98.3 F (36.8 C)  98 F (  36.7 C)  TempSrc: Oral     SpO2: 95% 93%  90%  Weight:   70.6 kg   Height:        Intake/Output Summary (Last 24 hours) at 12/21/2024 1241 Last data filed at 12/21/2024 0900 Gross per 24 hour  Intake 473 ml  Output --  Net 473 ml   Filed Weights   12/19/24 0947 12/21/24 0556  Weight: 70.5 kg 70.6 kg    Examination:  General exam: calm Respiratory system: Clear to auscultation. Respiratory effort normal. Cardiovascular system: S1 & S2 heard, RRR.   Gastrointestinal  system: Abdomen is nondistended, soft and nontender.   Central nervous system: Alert and oriented to self and place but not town, not year. Cn 2-12 grossly intact, symmetric upper and lower strength. no tremor Extremities: Symmetric 5 x 5 power. Skin: No rashes, lesions or ulcers Psychiatry: confused    Data Reviewed: I have personally reviewed following labs and imaging studies  CBC: Recent Labs  Lab 12/19/24 1035 12/20/24 0551  WBC 4.6 6.0  NEUTROABS 3.4  --   HGB 15.3* 15.3*  HCT 47.2* 47.3*  MCV 92.9 93.8  PLT 147* 173   Basic Metabolic Panel: Recent Labs  Lab 12/19/24 1035 12/19/24 1550 12/20/24 0551 12/21/24 0510  NA 144  --  148* 146*  K 4.6  --  4.2 3.4*  CL 106  --  110 109  CO2 30  --  26 27  GLUCOSE 128*  --  132* 96  BUN 19  --  22 27*  CREATININE 0.91  --  0.98 0.82  CALCIUM  9.6  --  8.4* 8.9  MG  --  2.2  --   --    GFR: Estimated Creatinine Clearance: 60.1 mL/min (by C-G formula based on SCr of 0.82 mg/dL). Liver Function Tests: Recent Labs  Lab 12/19/24 1035  AST 25  ALT 34  ALKPHOS 68  BILITOT 0.3  PROT 7.3  ALBUMIN 4.5   No results for input(s): LIPASE, AMYLASE in the last 168 hours. Recent Labs  Lab 12/19/24 1135  AMMONIA 45*   Coagulation Profile: No results for input(s): INR, PROTIME in the last 168 hours. Cardiac Enzymes: No results for input(s): CKTOTAL, CKMB, CKMBINDEX, TROPONINI in the last 168 hours. BNP (last 3 results) Recent Labs    12/19/24 1035  PROBNP 749.0*   HbA1C: Recent Labs    12/19/24 2238  HGBA1C 6.0*   CBG: Recent Labs  Lab 12/19/24 2248 12/20/24 0727  GLUCAP 159* 145*   Lipid Profile: No results for input(s): CHOL, HDL, LDLCALC, TRIG, CHOLHDL, LDLDIRECT in the last 72 hours. Thyroid Function Tests: Recent Labs    12/20/24 0551  TSH 1.430   Anemia Panel: Recent Labs    12/21/24 0510  VITAMINB12 2,211*   Urine analysis:    Component Value Date/Time    COLORURINE COLORLESS (A) 12/19/2024 1115   APPEARANCEUR CLEAR (A) 12/19/2024 1115   LABSPEC 1.003 (L) 12/19/2024 1115   PHURINE 6.0 12/19/2024 1115   GLUCOSEU NEGATIVE 12/19/2024 1115   HGBUR NEGATIVE 12/19/2024 1115   BILIRUBINUR NEGATIVE 12/19/2024 1115   KETONESUR NEGATIVE 12/19/2024 1115   PROTEINUR NEGATIVE 12/19/2024 1115   NITRITE NEGATIVE 12/19/2024 1115   LEUKOCYTESUR NEGATIVE 12/19/2024 1115   Sepsis Labs: @LABRCNTIP (procalcitonin:4,lacticidven:4)  ) Recent Results (from the past 240 hours)  Resp panel by RT-PCR (RSV, Flu A&B, Covid) Anterior Nasal Swab     Status: None   Collection Time: 12/19/24  3:18 PM  Specimen: Anterior Nasal Swab  Result Value Ref Range Status   SARS Coronavirus 2 by RT PCR NEGATIVE NEGATIVE Final    Comment: (NOTE) SARS-CoV-2 target nucleic acids are NOT DETECTED.  The SARS-CoV-2 RNA is generally detectable in upper respiratory specimens during the acute phase of infection. The lowest concentration of SARS-CoV-2 viral copies this assay can detect is 138 copies/mL. A negative result does not preclude SARS-Cov-2 infection and should not be used as the sole basis for treatment or other patient management decisions. A negative result may occur with  improper specimen collection/handling, submission of specimen other than nasopharyngeal swab, presence of viral mutation(s) within the areas targeted by this assay, and inadequate number of viral copies(<138 copies/mL). A negative result must be combined with clinical observations, patient history, and epidemiological information. The expected result is Negative.  Fact Sheet for Patients:  bloggercourse.com  Fact Sheet for Healthcare Providers:  seriousbroker.it  This test is no t yet approved or cleared by the [place]  FDA and  has been authorized for detection and/or diagnosis of SARS-CoV-2 by FDA under an Emergency Use Authorization  (EUA). This EUA will remain  in effect (meaning this test can be used) for the duration of the COVID-19 declaration under Section 564(b)(1) of the Act, 21 U.S.C.section 360bbb-3(b)(1), unless the authorization is terminated  or revoked sooner.       Influenza A by PCR NEGATIVE NEGATIVE Final   Influenza B by PCR NEGATIVE NEGATIVE Final    Comment: (NOTE) The Xpert Xpress SARS-CoV-2/FLU/RSV plus assay is intended as an aid in the diagnosis of influenza from Nasopharyngeal swab specimens and should not be used as a sole basis for treatment. Nasal washings and aspirates are unacceptable for Xpert Xpress SARS-CoV-2/FLU/RSV testing.  Fact Sheet for Patients: bloggercourse.com  Fact Sheet for Healthcare Providers: seriousbroker.it  This test is not yet approved or cleared by the [place]  FDA and has been authorized for detection and/or diagnosis of SARS-CoV-2 by FDA under an Emergency Use Authorization (EUA). This EUA will remain in effect (meaning this test can be used) for the duration of the COVID-19 declaration under Section 564(b)(1) of the Act, 21 U.S.C. section 360bbb-3(b)(1), unless the authorization is terminated or revoked.     Resp Syncytial Virus by PCR NEGATIVE NEGATIVE Final    Comment: (NOTE) Fact Sheet for Patients: bloggercourse.com  Fact Sheet for Healthcare Providers: seriousbroker.it  This test is not yet approved or cleared by the [place]  FDA and has been authorized for detection and/or diagnosis of SARS-CoV-2 by FDA under an Emergency Use Authorization (EUA). This EUA will remain in effect (meaning this test can be used) for the duration of the COVID-19 declaration under Section 564(b)(1) of the Act, 21 U.S.C. section 360bbb-3(b)(1), unless the authorization is terminated or revoked.  Performed at [facility], [address]  [place], [place] [address]   Culture, blood (Routine X 2) w Reflex to ID Panel     Status: None (Preliminary result)   Collection Time: 12/20/24  8:21 PM   Specimen: BLOOD  Result Value Ref Range Status   Specimen Description BLOOD BLOOD RIGHT ARM  Final   Special Requests   Final    BOTTLES DRAWN AEROBIC AND ANAEROBIC Blood Culture adequate volume   Culture   Final    NO GROWTH < 12 HOURS Performed at [facility], [address], [place], [place] [address]    Report Status PENDING  Incomplete  Culture, blood (Routine X 2) w Reflex  to ID Panel     Status: None (Preliminary result)   Collection Time: 12/20/24  8:29 PM   Specimen: BLOOD  Result Value Ref Range Status   Specimen Description BLOOD BLOOD RIGHT HAND  Final   Special Requests   Final    BOTTLES DRAWN AEROBIC AND ANAEROBIC Blood Culture adequate volume   Culture   Final    NO GROWTH < 12 HOURS Performed at [facility], [address] [place], [place] [address]    Report Status PENDING  Incomplete         Radiology Studies: No results found.       Scheduled Meds:  amLODipine   10 mg Oral Daily   clopidogrel   75 mg Oral Daily   enoxaparin  (LOVENOX ) injection  40 mg Subcutaneous Q24H   folic acid   1 mg Oral Daily   gabapentin   300 mg Oral TID   metoprolol  succinate  50 mg Oral Daily   multivitamin with minerals  1 tablet Oral Daily   phenobarbital   64.8 mg Oral Q8H   Followed by   NOREEN ON 12/22/2024] phenobarbital   32.4 mg Oral Q8H   sodium chloride  flush  3 mL Intravenous Q12H   umeclidinium bromide   1 puff Inhalation Daily   venlafaxine  XR  75 mg Oral TID   Continuous Infusions:  thiamine  (VITAMIN B1) injection 500 mg (12/21/24 1227)     LOS: 1 day     Devaughn KATHEE Ban, MD [facility]   If 7PM-7AM, please contact night-coverage [email or web address] Password [facility] 12/21/2024, 12:41 PM     "

## 2024-12-21 NOTE — TOC Progression Note (Signed)
 Transition of Care [facility]) - Progression Note    Patient Details  Name: Barbara Contreras MRN: [id number] Date of Birth: May 05, 1951  [facility] CM/SW Contact  Ceaser Ebeling L Denym [place], [place] Phone Number: 12/21/2024, 2:57 PM  Clinical Narrative:      CSW met with patient. Sister was at bedside. CSW discussed recommendations for SNF. Patient appeared to be alert and orientated. She understood CSW questions and responded with comprehension. She was adamant about not going to SNF, whereas her sister advised that she needed to go to SNF.   Patient advised that FL2 can be completed and bed search initiated but she wanted to make that clear that, that does not mean she was going.   CSW discussed choice for Home Health as a back up discharge plan. Patient agreeable to going home with home health services. Patient had no preference for agency and advised CSW to choose the agency that accepts her insurance coverage.   DME was discussed. Patient agreeable to accepting DME recommendations if recommendations are made. Patient advised that she does have a safety bar in the bathroom to assist her with getting in and out of the shower.   PCP is Bette Hough. Pharmacy is [facility] in [place].                    Expected Discharge Plan and Services                                               Social Drivers of Health (SDOH) Interventions SDOH Screenings   Food Insecurity: Patient Unable To Answer (12/20/2024)  Housing: Unknown (12/20/2024)  Transportation Needs: Patient Unable To Answer (12/20/2024)  Recent Concern: Transportation Needs - Unmet Transportation Needs (11/17/2024)   Received from [facility]  Utilities: Patient Unable To Answer (12/20/2024)  Depression (PHQ2-9): Low Risk (09/08/2024)  Financial Resource Strain: Medium Risk (11/17/2024)   Received from [facility]  Social Connections: Unknown (12/20/2024)  Tobacco Use: High Risk  (12/20/2024)    Readmission Risk Interventions     No data to display

## 2024-12-21 NOTE — [place] FL2 (Signed)
 " [place]  MEDICAID FL2 LEVEL OF CARE FORM     IDENTIFICATION  Patient Name: Barbara Contreras Birthdate: June 28, 1951 Sex: female Admission Date (Current Location): 12/19/2024  [facility] and [place] Number:  Chiropodist and Address:  [facility], [address] [place], [place] [address]      Provider Number: [id number]  Attending Physician Name and Address:  Kandis Devaughn Sayres, MD  Relative Name and Phone Number:  Leobardo Verneita Ahumada, Emergency Contact  [phone] [facility])    Current Level of Care: Hospital Recommended Level of Care: Skilled Nursing Facility Prior Approval Number:    Date Approved/Denied:   PASRR Number: [id number]  Discharge Plan: SNF    Current Diagnoses: Patient Active Problem List   Diagnosis Date Noted   CKD stage 3a, GFR 45-59 ml/min (HCC) 12/20/2024   Acute encephalopathy 12/19/2024   COPD (chronic obstructive pulmonary disease) (HCC) 04/27/2024   Coronary artery disease involving native coronary artery of native heart without angina pectoris 04/08/2024   Osteoporosis, 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   PAD (peripheral artery disease) 05/29/2023   Carotid stenosis, left 01/25/2023   Acute gastroenteritis 11/06/2022   Dyslipidemia 11/06/2022   Essential hypertension 11/06/2022   GERD without esophagitis 11/06/2022   Asthma, chronic 11/06/2022   Alcohol use disorder 10/02/2022   AKI (acute kidney injury) 10/02/2022   Closed fracture of rib of right side 10/02/2022   Closed L1 vertebral fracture (HCC) 10/02/2022   Closed L2 vertebral fracture (HCC) 10/02/2022   Fall 10/02/2022   Electrolyte abnormality 10/01/2022   Osteopenia 08/30/2022   Carcinoma of upper-outer quadrant of right breast in female, estrogen receptor positive (HCC) 08/03/2021   Carotid stenosis 08/13/2018   Tobacco use disorder 08/13/2018   Elevated troponin 06/16/2017   Syncope and collapse 03/09/2016   HTN (hypertension) 03/09/2016   HLD  (hyperlipidemia) 03/09/2016   Depression 03/09/2016   Heart murmur 03/09/2016    Orientation RESPIRATION BLADDER Height & Weight     Self, Situation, Place  Normal Continent Weight: 155 lb 10.3 oz (70.6 kg) Height:  5' 5.5 (166.4 cm)  BEHAVIORAL SYMPTOMS/MOOD NEUROLOGICAL BOWEL NUTRITION STATUS     (Concerns regarding cognitive status) Continent Diet  AMBULATORY STATUS COMMUNICATION OF NEEDS Skin   Supervision Verbally Normal                       Personal Care Assistance Level of Assistance  Bathing, Dressing Bathing Assistance: Limited assistance   Dressing Assistance: Limited assistance     Functional Limitations Info             SPECIAL CARE FACTORS FREQUENCY  PT (By licensed PT), OT (By licensed OT)     PT Frequency: 2x OT Frequency: 2x            Contractures Contractures Info: Not present    Additional Factors Info  Code Status, Allergies Code Status Info: FULL Allergies Info: Morphine And Codeine Medium  Itching   Clindamycin Not Specified  Other (See Comments)   Erythromycin Not Specified   Other reaction(s): Other (see comments)  Other Not Specified   Other reaction(s): Other (See Comments) Redness and itching  Penicillins Not Specified  Other (See Comments) TOLERATED CEFAZOLIN  PRIOR Reaction: unknown Has patient had a PCN reaction causing immediate rash, facial/tongue/throat swelling, SOB or lightheadedness with hypotension: Unknown Has patient had a PCN reaction causing severe rash involving mucus membranes or skin necrosis: Unknown Has patient had a PCN reaction that required hospitalization: Unknown Has  patient had a PCN reaction occurring within the last 10 years: no If all of the above answers are NO, then may proceed with Cephalosporin use.  Promethazine-phenylephrine  Not Specified  Other (See Comments) Muscle spasm  Tape Not Specified  Other (See Comments) Redness and itching           Current Medications (12/21/2024):  This is the current  hospital active medication list Current Facility-Administered Medications  Medication Dose Route Frequency Provider Last Rate Last Admin   acetaminophen  (TYLENOL ) tablet 650 mg  650 mg Oral Q6H PRN Fernand Prost, MD       Or   acetaminophen  (TYLENOL ) suppository 650 mg  650 mg Rectal Q6H PRN Fernand Prost, MD       amLODipine  (NORVASC ) tablet 10 mg  10 mg Oral Daily Kandis Devaughn Sayres, MD   10 mg at 12/21/24 1012   [START ON 12/22/2024] atorvastatin  (LIPITOR) tablet 80 mg  80 mg Oral Daily Wouk, Devaughn Sayres, MD       clopidogrel  (PLAVIX ) tablet 75 mg  75 mg Oral Daily Kandis Devaughn Sayres, MD   75 mg at 12/21/24 1013   dicyclomine  (BENTYL ) capsule 10 mg  10 mg Oral BID Wouk, Devaughn Sayres, MD       enoxaparin  (LOVENOX ) injection 40 mg  40 mg Subcutaneous Q24H Sharilyn Estill HERO, RPH   40 mg at 12/20/24 1908   fluticasone  furoate-vilanterol (BREO ELLIPTA ) 200-25 MCG/ACT 1 puff  1 puff Inhalation Daily Wouk, Devaughn Sayres, MD       folic acid  (FOLVITE ) tablet 1 mg  1 mg Oral Daily Wouk, Devaughn Sayres, MD   1 mg at 12/21/24 1012   gabapentin  (NEURONTIN ) capsule 300 mg  300 mg Oral TID Kandis Devaughn Sayres, MD   300 mg at 12/21/24 1012   LORazepam  (ATIVAN ) tablet 1-4 mg  1-4 mg Oral Q1H PRN Wouk, Devaughn Sayres, MD       Or   LORazepam  (ATIVAN ) injection 1-4 mg  1-4 mg Intravenous Q1H PRN Wouk, Devaughn Sayres, MD       metoprolol  succinate (TOPROL -XL) 24 hr tablet 50 mg  50 mg Oral Daily Wouk, Devaughn Sayres, MD   50 mg at 12/21/24 1012   montelukast  (SINGULAIR ) tablet 10 mg  10 mg Oral QHS Wouk, Devaughn Sayres, MD       multivitamin with minerals tablet 1 tablet  1 tablet Oral Daily Wouk, Devaughn Sayres, MD   1 tablet at 12/21/24 1012   ondansetron  (ZOFRAN ) tablet 4 mg  4 mg Oral Q6H PRN Fernand Prost, MD       Or   ondansetron  (ZOFRAN ) injection 4 mg  4 mg Intravenous Q6H PRN Fernand Prost, MD       PHENobarbital  (LUMINAL) tablet 64.8 mg  64.8 mg Oral Q8H Wouk, Devaughn Sayres, MD   64.8 mg at 12/21/24 1012   Followed  by   NOREEN ON 12/22/2024] PHENobarbital  (LUMINAL) tablet 32.4 mg  32.4 mg Oral Q8H Wouk, Devaughn Sayres, MD       senna-docusate (Senokot-S) tablet 1 tablet  1 tablet Oral QHS PRN Fernand Prost, MD       sodium chloride  flush (NS) 0.9 % injection 3 mL  3 mL Intravenous Q12H Khan, Ghalib, MD   3 mL at 12/21/24 1015   thiamine  (VITAMIN B1) 500 mg in sodium chloride  0.9 % 50 mL IVPB  500 mg Intravenous TID Kandis Devaughn Sayres, MD 110 mL/hr at 12/21/24 1227 500 mg at 12/21/24 1227   umeclidinium  bromide (INCRUSE ELLIPTA ) 62.5 MCG/ACT 1 puff  1 puff Inhalation Daily Wouk, Devaughn Sayres, MD   1 puff at 12/21/24 1014   venlafaxine  XR (EFFEXOR -XR) 24 hr capsule 75 mg  75 mg Oral TID Kandis Devaughn Sayres, MD   75 mg at 12/21/24 1012     Discharge Medications: Please see discharge summary for a list of discharge medications.  Relevant Imaging Results:  Relevant Lab Results:   Additional Information [id number]  Beatrix Breece L Jalyn Rosero, LCSW     "

## 2024-12-21 NOTE — Plan of Care (Signed)

## 2024-12-21 NOTE — Progress Notes (Signed)
 Mobility Specialist - Progress Note    12/21/24 1300  Mobility  Activity Ambulated with assistance;Stood with assistance  Level of Assistance Minimal assist, patient does 75% or more  Assistive Device Other (Comment) (pole)  Distance Ambulated (ft) 400 ft  Range of Motion/Exercises Active  Activity Response Tolerated well  Mobility Referral Yes  Mobility visit 1 Mobility   Pt resting in bed talking to family in room on RA. Pt confused but agreeable to participate in ambulation. Pt STS and ambulates to hallway around NS holding onto pole and MS throughout session. Pt required holding to avoid swaying and tripping over feet as well as directional cuing. Pt returned to bed and left with needs in reach. Bed alarm activated.   Guido Rumble Mobility Specialist 12/21/24, 1:18 PM

## 2024-12-21 NOTE — Progress Notes (Signed)
 Mobility Specialist - Progress Note   Pre-mobility: HR, BP, SpO2 During mobility: HR, BP, SpO2 Post-mobility: HR, BP, SPO2     12/21/24 1644  Mobility  Activity Ambulated with assistance;Stood with assistance;Dangled on edge of bed  Level of Assistance Contact guard assist, steadying assist  Assistive Device Other (Comment)  Distance Ambulated (ft) 400 ft  Range of Motion/Exercises Active  Activity Response Tolerated well  Mobility Referral Yes  Mobility visit 1 Mobility   Pt resting in bed talking to family in room on RA. Pt confused but agreeable to participate in ambulation. Pt STS and ambulates to hallway around NS holding onto MS throughout session. Pt required holding to avoid swaying and tripping over feet as well as directional cuing. Pt returned to bed and left with needs in reach. Bed alarm activated

## 2024-12-22 ENCOUNTER — Other Ambulatory Visit: Payer: Self-pay

## 2024-12-22 ENCOUNTER — Encounter: Payer: Self-pay | Admitting: Internal Medicine

## 2024-12-22 DIAGNOSIS — G9340 Encephalopathy, unspecified: Secondary | ICD-10-CM | POA: Diagnosis not present

## 2024-12-22 LAB — BASIC METABOLIC PANEL WITH GFR
Anion gap: 8 (ref 5–15)
BUN: 24 mg/dL — ABNORMAL HIGH (ref 8–23)
CO2: 25 mmol/L (ref 22–32)
Calcium: 8.8 mg/dL — ABNORMAL LOW (ref 8.9–10.3)
Chloride: 106 mmol/L (ref 98–111)
Creatinine, Ser: 0.82 mg/dL (ref 0.44–1.00)
GFR, Estimated: >60 mL/min
Glucose, Bld: 97 mg/dL (ref 70–99)
Potassium: 3.8 mmol/L (ref 3.5–5.1)
Sodium: 139 mmol/L (ref 135–145)

## 2024-12-22 MED ORDER — GABAPENTIN 100 MG PO TABS: 300.0000 mg | ORAL_TABLET | Freq: Three times a day (TID) | ORAL | Status: AC

## 2024-12-22 MED ORDER — CHLORDIAZEPOXIDE HCL 25 MG PO CAPS
ORAL_CAPSULE | ORAL | 0 refills | Status: AC
  Filled 2024-12-22: qty 4, 3d supply, fill #0

## 2024-12-22 MED ORDER — POLYETHYLENE GLYCOL 3350 17 G PO PACK
34.0000 g | PACK | Freq: Every day | ORAL | Status: DC
  Filled 2024-12-22: qty 2

## 2024-12-22 NOTE — TOC Transition Note (Signed)
 Transition of Care [facility]) - Discharge Note   Patient Details  Name: Barbara Contreras MRN: [id number] Date of Birth: May 11, 1951  [facility] CM/SW Contact:  Grayce JAYSON Perfect, RN Phone Number: 12/22/2024, 4:58 PM   Clinical Narrative:  RNCM met with patient in her room, daughter present.  Patient is refusing rehab in a nursing facility.  She is agreeable with homecare services for PT/OT.  RNCM notified Channing at [facility] of discharge today.  Cheryl plans to call patient at home today.  Patient left before receiving rolling walker.  Patient very anxious to leave the hospital.  [facility] notified.          Patient Goals and CMS Choice            Discharge Placement                       Discharge Plan and Services Additional resources added to the After Visit Summary for                                       Social Drivers of Health (SDOH) Interventions SDOH Screenings   Food Insecurity: Patient Unable To Answer (12/20/2024)  Housing: Unknown (12/20/2024)  Transportation Needs: Patient Unable To Answer (12/20/2024)  Recent Concern: Transportation Needs - Unmet Transportation Needs (11/17/2024)   Received from [facility]  Utilities: Patient Unable To Answer (12/20/2024)  Depression (PHQ2-9): Low Risk (09/08/2024)  Financial Resource Strain: Medium Risk (11/17/2024)   Received from [facility]  Social Connections: Unknown (12/20/2024)  Tobacco Use: High Risk (12/20/2024)     Readmission Risk Interventions     No data to display

## 2024-12-22 NOTE — Discharge Summary (Signed)
 Barbara Contreras FMW:[id number] DOB: Oct 23, 1951 DOA: 12/19/2024  PCP: Eliverto Bette Hover, MD  Admit date: 12/19/2024 Discharge date: 12/22/2024  Time spent: 35 minutes  Recommendations for Outpatient Follow-up:  Close pcp f/u Neurology referral place Substance abuse treatment     Discharge Diagnoses:  Principal Problem:   Acute encephalopathy Active Problems:   HTN (hypertension)   Essential hypertension   HLD (hyperlipidemia)   Tobacco use disorder   Carcinoma of upper-outer quadrant of right breast in female, estrogen receptor positive (HCC)   Alcohol use disorder   GERD without esophagitis   PAD (peripheral artery disease)   COPD (chronic obstructive pulmonary disease) (HCC)   CKD stage 3a, GFR 45-59 ml/min (HCC)   Discharge Condition: stable  Diet recommendation: heart healthy  Filed Weights   12/19/24 0947 12/21/24 0556 12/22/24 0500  Weight: 70.5 kg 70.6 kg 70.6 kg    History of present illness:  From admission h and p Barbara Contreras is a 74 y.o. year old female with medical history of hypertension, hyperlipidemia, type 2 diabetes, COPD, HFpEF (EF 55%, G2 DD) in 04/2024 presented to the ED after being found confused by family. On exam patient noted to be confused. Patient able to contribute to history. Family is present at bedside stating confusion developed overnight and she was found in the AM confused. When they told her to go to bed, she went into the dog's bed to sleep. She was found with her pulse ox in the 70s. On arrival to the ED patient was noted to be HDS stable. Lab work and imaging obtained. CBC without leukocytosis, mild thrombocytopenia which is new. CMP unremarkable, ethanol level negative, VBG 7.3/64/39. Troponin normal, BNP mildly elevated at 749. UA without signs of infection. CT head normal. CTA PE study was performed that showed no PE but did show cardiomegaly along with soft tissue stranding and dermal thickening in the medial right breast  concerning for scarring. Given need for continued care, [facility] contacted for admission.   Hospital Course:   # Delirium # Cognitive impairment Acute delirium 2/2 alcohol withdrawal but does appear to have underlying mci/dementia. Neuro exam non-focal, ct head negative. No infectious symptoms, ua negative, covid/flu/rsv neg. Mild hypercarbia on vbg but no respiratory symptoms. No significant electrolyte abnormality, lfts wnl. Tsh wnl, b12 wnl. UDS neg. Delirium/confusion now resolved with resolution of alcohol withdrawal. Was treated with high-dose thiamine  here - outpt pcp f/u, patient/family have also requested neurology referral which we have placed (to [facility] neurology)   # Alcohol use disorder with withdrawal Last drink 1/14. Drinks wine/beer every night, sister unaware how much. Agitated on 1/17. Withdrawal now essentially resolved, per family is essentially back to baseline. Treated with phenobarbital  taper here and as needed ativan . - short librium  taper at discharge - continue home multivitamin - substance abuse resources provided - close pcp f/u   # Hypernatremia Resolved with PO   # History breast cancer S/p lumpectomy, radiation. On anastrozole     # COPD No cough or wheeze to suggest exacerbation.  - home spiriva , breo, singulair    # HTN Bp wnl today - home amlodipine , metop   # PAD # CAS S/p vascular interventions - home plavix  - home statin, zetia      # MDD - home effexor    # t2dm Diet controlled, A1c 6 - monitor fastings   # Neuropathy - cont home gabapentin  but reduce dose to 300 tid from home 600   # Debility PT advising SNF initially but patient  has improved and now advising home health with rolling walker, both ordered.  Procedures: none   Consultations: none  Discharge Exam: Vitals:   12/22/24 0900 12/22/24 1136  BP: (!) 106/59 113/64  Pulse: (!) 56 (!) 57  Resp:  18  Temp:  98 F (36.7 C)  SpO2:  94%    General: NAD Cardiovascular:  RRR Respiratory: CTAB Neuro: calm, alert, oriented, no tremor  Discharge Instructions   Discharge Instructions     Ambulatory referral to Neurology   Complete by: As directed    Diet general   Complete by: As directed    Face-to-face encounter (required for Medicare/Medicaid patients)   Complete by: As directed    I Devaughn KATHEE Ban certify that this patient is under my care and that I, or a nurse practitioner or physician's assistant working with me, had a face-to-face encounter that meets the physician face-to-face encounter requirements with this patient on 12/22/2024. The encounter with the patient was in whole, or in part for the following medical condition(s) which is the primary reason for home health care (List medical condition): dementia, alcohol use disorder, copd   The encounter with the patient was in whole, or in part, for the following medical condition, which is the primary reason for home health care: dementia, alcoholism   I certify that, based on my findings, the following services are medically necessary home health services: Physical therapy   Reason for Medically Necessary Home Health Services: Therapy- Therapeutic Exercises to Increase Strength and Endurance   My clinical findings support the need for the above services: Cognitive impairments, dementia, or mental confusion  that make it unsafe to leave home   Further, I certify that my clinical findings support that this patient is homebound due to: Mental confusion   For home use only DME Walker rolling   Complete by: As directed    Walker: With 5 Inch Wheels   Patient needs a walker to treat with the following condition: Dementia (HCC)   Home Health   Complete by: As directed    To provide the following care/treatments:  PT OT     Increase activity slowly   Complete by: As directed       Allergies as of 12/22/2024       Reactions   Morphine And Codeine Itching   Clindamycin Other (See Comments)   Erythromycin     Other reaction(s): Other (see comments)   Other    Other reaction(s): Other (See Comments) Redness and itching   Penicillins Other (See Comments)   TOLERATED CEFAZOLIN  PRIOR Reaction: unknown Has patient had a PCN reaction causing immediate rash, facial/tongue/throat swelling, SOB or lightheadedness with hypotension: Unknown Has patient had a PCN reaction causing severe rash involving mucus membranes or skin necrosis: Unknown Has patient had a PCN reaction that required hospitalization: Unknown Has patient had a PCN reaction occurring within the last 10 years: no If all of the above answers are NO, then may proceed with Cephalosporin use.   Promethazine-phenylephrine  Other (See Comments)   Muscle spasm   Tape Other (See Comments)   Redness and itching        Medication List     STOP taking these medications    celecoxib 200 MG capsule Commonly known as: CELEBREX   traMADol  50 MG tablet Commonly known as: ULTRAM        TAKE these medications    amLODipine  10 MG tablet Commonly known as: NORVASC  Take 1 tablet (10 mg total)  by mouth daily.   anastrozole  1 MG tablet Commonly known as: ARIMIDEX  TAKE 1 TABLET BY MOUTH EVERY DAY   atorvastatin  80 MG tablet Commonly known as: LIPITOR Take 80 mg by mouth daily.   baclofen  10 MG tablet Commonly known as: LIORESAL  Take 5-10 mg by mouth at bedtime as needed for muscle spasms.   calcium  carbonate 750 MG chewable tablet Commonly known as: TUMS EX Chew 1 tablet by mouth daily as needed for heartburn.   chlordiazePOXIDE  25 MG capsule Commonly known as: LIBRIUM  Twice daily for 1 day then once daily for 2 days   clobetasol  0.05 % external solution Commonly known as: TEMOVATE  Apply 1 Application topically daily.   clopidogrel  75 MG tablet Commonly known as: PLAVIX  TAKE 1 TABLET BY MOUTH EVERY DAY   cyanocobalamin  1000 MCG tablet Commonly known as: VITAMIN B12 Take 1,000 mcg by mouth daily.   dicyclomine  10 MG  capsule Commonly known as: BENTYL  Take 10 mg by mouth in the morning and at bedtime.   ezetimibe  10 MG tablet Commonly known as: ZETIA  Take 1 tablet by mouth daily.   famotidine  20 MG tablet Commonly known as: PEPCID  Take 20 mg by mouth 2 (two) times daily.   fluticasone  50 MCG/ACT nasal spray Commonly known as: FLONASE  Place 1 spray into both nostrils daily.   gabapentin  100 MG tablet Commonly known as: NEURONTIN  Take 3 tablets (300 mg total) by mouth 3 (three) times daily. What changed:  medication strength how much to take   iron  polysaccharides 150 MG capsule Commonly known as: NIFEREX Take 1 capsule (150 mg total) by mouth daily.   ketoconazole  2 % shampoo Commonly known as: NIZORAL  Apply 1 application topically once a week.   metoprolol  succinate 50 MG 24 hr tablet Commonly known as: TOPROL -XL Take 50 mg by mouth daily.   montelukast  10 MG tablet Commonly known as: SINGULAIR  Take 1 tablet by mouth at bedtime.   multivitamin with minerals Tabs tablet Take 1 tablet by mouth daily.   pantoprazole  40 MG tablet Commonly known as: PROTONIX  Take 1 tablet (40 mg total) by mouth at bedtime as needed.   Repatha SureClick 140 MG/ML Soaj Generic drug: Evolocumab Inject 140 mg into the skin every 14 (fourteen) days.   Spiriva  HandiHaler 18 MCG Caps Generic drug: Tiotropium Bromide  Irrigate with 1 capsule as directed daily.   triamcinolone cream 0.1 % Commonly known as: KENALOG Apply 1 Application topically 2 (two) times daily.   venlafaxine  XR 75 MG 24 hr capsule Commonly known as: EFFEXOR -XR Take 75 mg by mouth 3 (three) times daily.   Vitamin D3 10 MCG (400 UNIT) tablet Take 400 Units by mouth daily.   Wixela Inhub 250-50 MCG/ACT Aepb Generic drug: fluticasone -salmeterol Inhale 1 puff into the lungs in the morning and at bedtime.               Durable Medical Equipment  (From admission, onward)           Start     Ordered   12/22/24 0000   For home use only DME Walker rolling       Question Answer Comment  Walker: With 5 Inch Wheels   Patient needs a walker to treat with the following condition Dementia (HCC)      12/22/24 1245           Allergies[1]  Follow-up Information     Eliverto Bette Hover, MD Follow up.   Specialty: Family Medicine Why: 1 week Contact  information: [address] [place] [address] [phone]                  The results of significant diagnostics from this hospitalization (including imaging, microbiology, ancillary and laboratory) are listed below for reference.    Significant Diagnostic Studies: CT Angio Chest PE W/Cm &/Or Wo Cm Result Date: 12/19/2024 EXAM: CTA CHEST 12/19/2024 11:51:59 AM TECHNIQUE: CTA of the chest was performed after the administration of 75 mL of iohexol  (OMNIPAQUE ) 350 MG/ML injection. Multiplanar reformatted images are provided for review. MIP images are provided for review. Automated exposure control, iterative reconstruction, and/or weight based adjustment of the mA/kV was utilized to reduce the radiation dose to as low as reasonably achievable. COMPARISON: CT of the chest dated 06/20/2024. CLINICAL HISTORY: Pulmonary embolism (PE) suspected, high prob. FINDINGS: PULMONARY ARTERIES: Pulmonary arteries are adequately opacified for evaluation. No acute pulmonary embolus. Main pulmonary artery is normal in caliber. MEDIASTINUM: The heart is mildly enlarged. There is a mildly prominent left atrial appendage. The pericardium demonstrates no acute abnormality. The thoracic aorta demonstrates moderate calcific atheromatous disease. LYMPH NODES: No mediastinal, hilar or axillary lymphadenopathy. LUNGS AND PLEURA: There are ground glass opacities within the lungs bilaterally. There is mild dependent atelectasis. There is a tiny right-sided pleural effusion. No pneumothorax. UPPER ABDOMEN: The patient is status post cholecystectomy. SOFT TISSUES AND BONES: There  is soft tissue stranding/scarring present superiorly within the right breast. There is mild thickening of the dermis of the right breast present medially. No acute bone abnormality. IMPRESSION: 1. No pulmonary embolism. 2. Bilateral ground glass opacities and mild dependent atelectasis. 3. Tiny right-sided pleural effusion. 4. Mild cardiomegaly with mildly prominent left atrial appendage. 5. Moderate calcific atheromatous disease of the thoracic aorta. 6. Soft tissue stranding/scarring and mild dermal thickening in the medial right breast. Electronically signed by: Evalene Coho MD 12/19/2024 12:12 PM EST RP Workstation: [id number]   DG Chest 1 View Result Date: 12/19/2024 CLINICAL DATA:  Altered mental status EXAM: CHEST  1 VIEW COMPARISON:  Apr 27, 2024 FINDINGS: Stable cardiomediastinal silhouette. Minimal bibasilar subsegmental atelectasis or edema is noted. Bony thorax is unremarkable. IMPRESSION: Minimal bibasilar subsegmental atelectasis or edema. Electronically Signed   By: Lynwood Landy Raddle M.D.   On: 12/19/2024 10:21   CT Head Wo Contrast Result Date: 12/19/2024 EXAM: CT HEAD WITHOUT CONTRAST 12/19/2024 10:03:21 AM TECHNIQUE: CT of the head was performed without the administration of intravenous contrast. Automated exposure control, iterative reconstruction, and/or weight based adjustment of the mA/kV was utilized to reduce the radiation dose to as low as reasonably achievable. COMPARISON: CT of the head dated 10/01/2022. CLINICAL HISTORY: Mental status change, unknown cause; ams. FINDINGS: BRAIN AND VENTRICLES: No acute hemorrhage. No evidence of acute infarct. No hydrocephalus. No extra-axial collection. No mass effect or midline shift. Calcified atherosclerotic plaque in cavernous/supraclinoid ICA and intradural vertebral arteries. ORBITS: No acute abnormality. SINUSES: No acute abnormality. SOFT TISSUES AND SKULL: No acute soft tissue abnormality. No skull fracture. IMPRESSION: 1. No acute  intracranial abnormality. 2. Calcified atherosclerotic plaque in the cavernous/supraclinoid ICAs and intradural vertebral arteries. Electronically signed by: Evalene Coho MD 12/19/2024 10:12 AM EST RP Workstation: [id number]    Microbiology: Recent Results (from the past 240 hours)  Resp panel by RT-PCR (RSV, Flu A&B, Covid) Anterior Nasal Swab     Status: None   Collection Time: 12/19/24  3:18 PM   Specimen: Anterior Nasal Swab  Result Value Ref Range Status   SARS Coronavirus 2 by  RT PCR NEGATIVE NEGATIVE Final    Comment: (NOTE) SARS-CoV-2 target nucleic acids are NOT DETECTED.  The SARS-CoV-2 RNA is generally detectable in upper respiratory specimens during the acute phase of infection. The lowest concentration of SARS-CoV-2 viral copies this assay can detect is 138 copies/mL. A negative result does not preclude SARS-Cov-2 infection and should not be used as the sole basis for treatment or other patient management decisions. A negative result may occur with  improper specimen collection/handling, submission of specimen other than nasopharyngeal swab, presence of viral mutation(s) within the areas targeted by this assay, and inadequate number of viral copies(<138 copies/mL). A negative result must be combined with clinical observations, patient history, and epidemiological information. The expected result is Negative.  Fact Sheet for Patients:  bloggercourse.com  Fact Sheet for Healthcare Providers:  seriousbroker.it  This test is no t yet approved or cleared by the [place]  FDA and  has been authorized for detection and/or diagnosis of SARS-CoV-2 by FDA under an Emergency Use Authorization (EUA). This EUA will remain  in effect (meaning this test can be used) for the duration of the COVID-19 declaration under Section 564(b)(1) of the Act, 21 U.S.C.section 360bbb-3(b)(1), unless the authorization is terminated  or  revoked sooner.       Influenza A by PCR NEGATIVE NEGATIVE Final   Influenza B by PCR NEGATIVE NEGATIVE Final    Comment: (NOTE) The Xpert Xpress SARS-CoV-2/FLU/RSV plus assay is intended as an aid in the diagnosis of influenza from Nasopharyngeal swab specimens and should not be used as a sole basis for treatment. Nasal washings and aspirates are unacceptable for Xpert Xpress SARS-CoV-2/FLU/RSV testing.  Fact Sheet for Patients: bloggercourse.com  Fact Sheet for Healthcare Providers: seriousbroker.it  This test is not yet approved or cleared by the [place]  FDA and has been authorized for detection and/or diagnosis of SARS-CoV-2 by FDA under an Emergency Use Authorization (EUA). This EUA will remain in effect (meaning this test can be used) for the duration of the COVID-19 declaration under Section 564(b)(1) of the Act, 21 U.S.C. section 360bbb-3(b)(1), unless the authorization is terminated or revoked.     Resp Syncytial Virus by PCR NEGATIVE NEGATIVE Final    Comment: (NOTE) Fact Sheet for Patients: bloggercourse.com  Fact Sheet for Healthcare Providers: seriousbroker.it  This test is not yet approved or cleared by the [place]  FDA and has been authorized for detection and/or diagnosis of SARS-CoV-2 by FDA under an Emergency Use Authorization (EUA). This EUA will remain in effect (meaning this test can be used) for the duration of the COVID-19 declaration under Section 564(b)(1) of the Act, 21 U.S.C. section 360bbb-3(b)(1), unless the authorization is terminated or revoked.  Performed at [facility], [address] [place], [place] [address]   Culture, blood (Routine X 2) w Reflex to ID Panel     Status: None (Preliminary result)   Collection Time: 12/20/24  8:21 PM   Specimen: BLOOD  Result Value Ref Range Status   Specimen Description BLOOD  BLOOD RIGHT ARM  Final   Special Requests   Final    BOTTLES DRAWN AEROBIC AND ANAEROBIC Blood Culture adequate volume   Culture   Final    NO GROWTH 2 DAYS Performed at [facility], [address], [place], [place] [address]    Report Status PENDING  Incomplete  Culture, blood (Routine X 2) w Reflex to ID Panel     Status: None (Preliminary result)   Collection Time: 12/20/24  8:29 PM   Specimen: BLOOD  Result Value Ref Range Status   Specimen Description BLOOD BLOOD RIGHT HAND  Final   Special Requests   Final    BOTTLES DRAWN AEROBIC AND ANAEROBIC Blood Culture adequate volume   Culture   Final    NO GROWTH 2 DAYS Performed at [facility], [address] [place], [place] [address]    Report Status PENDING  Incomplete     Labs: Basic Metabolic Panel: Recent Labs  Lab 12/19/24 1035 12/19/24 1550 12/20/24 0551 12/21/24 0510 12/22/24 0529  NA 144  --  148* 146* 139  K 4.6  --  4.2 3.4* 3.8  CL 106  --  110 109 106  CO2 30  --  26 27 25   GLUCOSE 128*  --  132* 96 97  BUN 19  --  22 27* 24*  CREATININE 0.91  --  0.98 0.82 0.82  CALCIUM  9.6  --  8.4* 8.9 8.8*  MG  --  2.2  --   --   --    Liver Function Tests: Recent Labs  Lab 12/19/24 1035  AST 25  ALT 34  ALKPHOS 68  BILITOT 0.3  PROT 7.3  ALBUMIN 4.5   No results for input(s): LIPASE, AMYLASE in the last 168 hours. Recent Labs  Lab 12/19/24 1135  AMMONIA 45*   CBC: Recent Labs  Lab 12/19/24 1035 12/20/24 0551  WBC 4.6 6.0  NEUTROABS 3.4  --   HGB 15.3* 15.3*  HCT 47.2* 47.3*  MCV 92.9 93.8  PLT 147* 173   Cardiac Enzymes: No results for input(s): CKTOTAL, CKMB, CKMBINDEX, TROPONINI in the last 168 hours. BNP: BNP (last 3 results) Recent Labs    04/27/24 0425  BNP 901.6*    ProBNP (last 3 results) Recent Labs    12/19/24 1035  PROBNP 749.0*    CBG: Recent Labs  Lab 12/19/24 2248 12/20/24 0727  GLUCAP 159* 145*       Signed:  Devaughn KATHEE Ban MD.  [facility] 12/22/2024, 12:49 PM     [1]  Allergies Allergen Reactions   Morphine And Codeine Itching   Clindamycin Other (See Comments)   Erythromycin     Other reaction(s): Other (see comments)   Other     Other reaction(s): Other (See Comments) Redness and itching   Penicillins Other (See Comments)    TOLERATED CEFAZOLIN  PRIOR Reaction: unknown Has patient had a PCN reaction causing immediate rash, facial/tongue/throat swelling, SOB or lightheadedness with hypotension: Unknown Has patient had a PCN reaction causing severe rash involving mucus membranes or skin necrosis: Unknown Has patient had a PCN reaction that required hospitalization: Unknown Has patient had a PCN reaction occurring within the last 10 years: no If all of the above answers are NO, then may proceed with Cephalosporin use.   Promethazine-Phenylephrine  Other (See Comments)    Muscle spasm   Tape Other (See Comments)    Redness and itching

## 2024-12-22 NOTE — TOC Transition Note (Signed)
 Transition of Care [facility]) - Discharge Note   Patient Details  Name: Barbara Contreras MRN: [id number] Date of Birth: January 11, 1951  Transition of Care [facility]) CM/SW Contact:  Grayce JAYSON Perfect, RN Phone Number: 12/22/2024, 5:06 PM   Clinical Narrative:   RNCM met with patient in her room.  She is ready for discharge.  Name and number of homehealth company [employer]) given to her for contact.  [facility] plans to contact patient this afternoon.  Patient anxious to leave hospital and did not wait for rolling walker.  RN notified [employer] [facility] of this.  Patient's daughter to transport patient home.    Final next level of care: Home w Home Health Services Barriers to Discharge: No Barriers Identified   Patient Goals and CMS Choice Patient states their goals for this hospitalization and ongoing recovery are:: going home   Choice offered to / list presented to : Patient      Discharge Placement                       Discharge Plan and Services Additional resources added to the After Visit Summary for                            [facility] Arranged: PT, OT [facility] Agency: [facility] Date [facility] Agency Contacted: 12/22/24   Representative spoke with at [facility] Agency: [place]  Social Drivers of Health (SDOH) Interventions SDOH Screenings   Food Insecurity: Patient Unable To Answer (12/20/2024)  Housing: Unknown (12/20/2024)  Transportation Needs: Patient Unable To Answer (12/20/2024)  Recent Concern: Transportation Needs - Unmet Transportation Needs (11/17/2024)   Received from [facility]  Utilities: Patient Unable To Answer (12/20/2024)  Depression (PHQ2-9): Low Risk (09/08/2024)  Financial Resource Strain: Medium Risk (11/17/2024)   Received from [facility]  Social Connections: Unknown (12/20/2024)  Tobacco Use: High Risk (12/20/2024)     Readmission Risk Interventions     No data to display

## 2024-12-22 NOTE — Progress Notes (Addendum)
 Physical Therapy Treatment Patient Details Name: Barbara Contreras MRN: [id number] DOB: [birth year] Today's Date: 12/22/2024   History of Present Illness Barbara Contreras is a 74 y.o. year old female with medical history of hypertension, hyperlipidemia, type 2 diabetes, COPD, HFpEF (EF 55%, G2 DD) in 04/2024 presented to the ED after being found confused by family.  On exam patient noted to be confused.  Patient able to contribute to history.  Family is present at bedside stating confusion developed overnight and she was found in the AM confused. When they told her to go to bed, she went into the dog's bed to sleep. She was found with her pulse ox in the 70s.  On arrival to the ED patient was noted to be HDS stable.  Lab work and imaging obtained.  CBC without leukocytosis, mild thrombocytopenia which is new.  CMP unremarkable, ethanol level negative, VBG 7.3/64/39.  Troponin normal, BNP mildly elevated at 749.  UA without signs of infection.  CT head normal.  CTA PE study was performed that showed no PE but did show cardiomegaly along with soft tissue stranding and dermal thickening in the medial right breast concerning for scarring.  Given need for continued care, [facility] contacted for admission.    PT Comments  Pt able to don socks and get OOB with ease.  Stands and begins to walk 150' with no AD.  Up/down steps with ease then is given [facility] where she walks an additional 450' with cga.  Balance deficits are evident but when questioned pt feels it is close to her baseline.  Did not use AD at home but would hold objects for support.  Gait is improved with SPC but it is recommended for outside and community gait that she consider a RW for balance.  She stated she has a SPC at home if she feels she needs it in the home.  She seems receptive to RW in community.  Given significant improvement in mobility and cognition will update recommendations to reflect.  Pt also stated she would like to go home vs SN but is  open to HHPT upon discharge for continued therapy interventions.  She would benefit from +1 supervision at home.  Cognition is clearer today with no outright deficits observed.   If plan is discharge home, recommend the following: A little help with walking and/or transfers;A little help with bathing/dressing/bathroom;Assist for transportation;Help with stairs or ramp for entrance;Assistance with cooking/housework   Can travel by private vehicle     Yes  Equipment Recommendations  Rolling walker (2 wheels)    Recommendations for Other Services       Precautions / Restrictions Precautions Precautions: Fall Restrictions Weight Bearing Restrictions Per Provider Order: No     Mobility  Bed Mobility Overal bed mobility: Independent               Patient Response: Cooperative  Transfers Overall transfer level: Independent     Sit to Stand: Supervision                Ambulation/Gait Ambulation/Gait assistance: Contact guard assist Gait Distance (Feet): 600 Feet Assistive device: None, Straight cane Gait Pattern/deviations: Step-through pattern, Decreased step length - left, Decreased step length - right, Staggering left, Staggering right Gait velocity: dec     General Gait Details: mild balance deficits observed. pt stated she feels she is at baseline.  did not use AD at home but would hold objects which is consistant with her gait today  Stairs Stairs: Yes Stairs assistance: Supervision Stair Management: Alternating pattern, Two rails Number of Stairs: 4 General stair comments: with ease   Wheelchair Mobility     Tilt Bed Tilt Bed Patient Response: Cooperative  Modified Rankin (Stroke Patients Only)       Balance Overall balance assessment: Mild deficits observed, not formally tested, Needs assistance Sitting-balance support: Feet supported Sitting balance-Leahy Scale: Normal     Standing balance support: During functional activity, Single  extremity supported Standing balance-Leahy Scale: Fair                              Hotel Manager: No apparent difficulties  Cognition Arousal: Alert Behavior During Therapy: WFL for tasks assessed/performed   PT - Cognitive impairments: No apparent impairments                       PT - Cognition Comments: seems much clearer today and appropriate during session Following commands: Intact Following commands impaired: Only follows one step commands consistently    Cueing Cueing Techniques: Verbal cues, Gestural cues, Tactile cues  Exercises      General Comments        Pertinent Vitals/Pain Pain Assessment Pain Assessment: No/denies pain    Home Living                          Prior Function            PT Goals (current goals can now be found in the care plan section) Progress towards PT goals: Progressing toward goals    Frequency    Min 2X/week      PT Plan      Co-evaluation              AM-PAC PT 6 Clicks Mobility   Outcome Measure  Help needed turning from your back to your side while in a flat bed without using bedrails?: None Help needed moving from lying on your back to sitting on the side of a flat bed without using bedrails?: None Help needed moving to and from a bed to a chair (including a wheelchair)?: None Help needed standing up from a chair using your arms (e.g., wheelchair or bedside chair)?: None Help needed to walk in hospital room?: A Little Help needed climbing 3-5 steps with a railing? : A Little 6 Click Score: 22    End of Session Equipment Utilized During Treatment: Gait belt Activity Tolerance: Patient tolerated treatment well Patient left: in chair;with call bell/phone within reach;with chair alarm set Nurse Communication: Mobility status PT Visit Diagnosis: Other abnormalities of gait and mobility (R26.89);Muscle weakness (generalized) (M62.81);Difficulty in  walking, not elsewhere classified (R26.2)     Time: 9147-9093 PT Time Calculation (min) (ACUTE ONLY): 14 min  Charges:    $Gait Training: 8-22 mins PT General Charges $$ ACUTE PT VISIT: 1 Visit                   Lauraine Gills, PTA 12/22/24, 9:15 AM

## 2024-12-22 NOTE — Progress Notes (Signed)
 Occupational Therapy Treatment Patient Details Name: Barbara Contreras MRN: [id number] DOB: 1951-06-18 Today's Date: 12/22/2024   History of present illness Barbara Contreras is a 74 y.o. year old female with medical history of hypertension, hyperlipidemia, type 2 diabetes, COPD, HFpEF (EF 55%, G2 DD) in 04/2024 presented to the ED after being found confused by family.  On exam patient noted to be confused.  Patient able to contribute to history.  Family is present at bedside stating confusion developed overnight and she was found in the AM confused. When they told her to go to bed, she went into the dog's bed to sleep. She was found with her pulse ox in the 70s.  On arrival to the ED patient was noted to be HDS stable.  Lab work and imaging obtained.  CBC without leukocytosis, mild thrombocytopenia which is new.  CMP unremarkable, ethanol level negative, VBG 7.3/64/39.  Troponin normal, BNP mildly elevated at 749.  UA without signs of infection.  CT head normal.  CTA PE study was performed that showed no PE but did show cardiomegaly along with soft tissue stranding and dermal thickening in the medial right breast concerning for scarring.  Given need for continued care, [facility] contacted for admission.   OT comments  Pt seen for OT tx. Pt received in recliner, agreeable to session. Pt demonstrating significant improvement in cognition and function this date. Pt required SBA-supv for ADL transfer from recliner, supv for ambulation in the room. She tolerated standing at the sink for ~37min for grooming tasks without difficulty. Pt edu in ECS including activity pacing, work simplification, home/routines modifications for ADL and IADL, as well as pet care considerations to maximize safety and gradual return to prior habits/routines. Pt verbalized understanding. Continues to benefit from skilled OT Services. Upgraded DC recommendation based on progress.      If plan is discharge home, recommend the following:   Assistance with cooking/housework;Assist for transportation;Help with stairs or ramp for entrance;Direct supervision/assist for medications management   Equipment Recommendations  None recommended by OT       Precautions / Restrictions Precautions Precautions: Fall Recall of Precautions/Restrictions: Intact Restrictions Weight Bearing Restrictions Per Provider Order: No       Mobility Bed Mobility     General bed mobility comments: NT, in recliner pre and post    Transfers Overall transfer level: Independent Equipment used: None Transfers: Sit to/from Stand       Balance Overall balance assessment: Mild deficits observed, not formally tested         ADL either performed or assessed with clinical judgement   ADL Overall ADL's : Needs assistance/impaired     Grooming: Standing;Modified independent;Supervision/safety;Oral care;Wash/dry face;Wash/dry Programmer, Applications Details (indicate cue type and reason): no LOB, demo's good awareness of safety           Communication Communication Communication: No apparent difficulties   Cognition Arousal: Alert Behavior During Therapy: WFL for tasks assessed/performed           Following commands: Intact Following commands impaired: Follows multi-step commands with increased time      Cueing   Cueing Techniques: Verbal cues  Exercises Other Exercises Other Exercises: Pt edu in ECS including activity pacing, work simplification, home/routines modifications for ADL and IADL, as well as pet care considerations to maximize safety and gradual return to prior habits/routines.       General Comments VSS    Pertinent Vitals/ Pain       Pain Assessment Pain Assessment:  No/denies pain   Frequency  Min 2X/week        Progress Toward Goals  OT Goals(current goals can now be found in the care plan section)  Progress towards OT goals: Progressing toward goals  Acute Rehab OT Goals OT Goal Formulation: Patient  unable to participate in goal setting Time For Goal Achievement: 01/02/25 Potential to Achieve Goals: Good   AM-PAC OT 6 Clicks Daily Activity     Outcome Measure   Help from another person eating meals?: None Help from another person taking care of personal grooming?: None Help from another person toileting, which includes using toliet, bedpan, or urinal?: None Help from another person bathing (including washing, rinsing, drying)?: A Little Help from another person to put on and taking off regular upper body clothing?: None Help from another person to put on and taking off regular lower body clothing?: None 6 Click Score: 23    End of Session    OT Visit Diagnosis: Other abnormalities of gait and mobility (R26.89);Muscle weakness (generalized) (M62.81)   Activity Tolerance Patient tolerated treatment well   Patient Left in chair;with call bell/phone within reach;with chair alarm set   Nurse Communication          Time: [phone] OT Time Calculation (min): 19 min  Charges: OT General Charges $OT Visit: 1 Visit OT Treatments $Self Care/Home Management : 8-22 mins  Warren SAUNDERS., MPH, MS, OTR/L ascom [phone] 12/22/24, 10:40 AM

## 2024-12-25 LAB — CULTURE, BLOOD (ROUTINE X 2)
Culture: NO GROWTH
Culture: NO GROWTH
Special Requests: ADEQUATE
Special Requests: ADEQUATE

## 2025-03-09 ENCOUNTER — Other Ambulatory Visit

## 2025-03-09 ENCOUNTER — Ambulatory Visit: Admitting: Internal Medicine
